# Patient Record
Sex: Male | Born: 1990
Health system: Southern US, Community
[De-identification: ages and names within clinical notes are randomized; demographics above are authoritative.]

## PROBLEM LIST (undated history)

## (undated) ENCOUNTER — Emergency Department (HOSPITAL_COMMUNITY): Source: Home / Self Care

## (undated) ENCOUNTER — Emergency Department (HOSPITAL_COMMUNITY): Payer: Medicaid Other

## (undated) ENCOUNTER — Ambulatory Visit (HOSPITAL_COMMUNITY): Admission: EM | Source: Home / Self Care

## (undated) DIAGNOSIS — I639 Cerebral infarction, unspecified: Secondary | ICD-10-CM

## (undated) DIAGNOSIS — T8484XA Pain due to internal orthopedic prosthetic devices, implants and grafts, initial encounter: Secondary | ICD-10-CM

## (undated) DIAGNOSIS — R569 Unspecified convulsions: Secondary | ICD-10-CM

## (undated) DIAGNOSIS — D696 Thrombocytopenia, unspecified: Secondary | ICD-10-CM

## (undated) DIAGNOSIS — E785 Hyperlipidemia, unspecified: Secondary | ICD-10-CM

## (undated) DIAGNOSIS — I10 Essential (primary) hypertension: Secondary | ICD-10-CM

## (undated) DIAGNOSIS — K769 Liver disease, unspecified: Secondary | ICD-10-CM

## (undated) DIAGNOSIS — K219 Gastro-esophageal reflux disease without esophagitis: Secondary | ICD-10-CM

## (undated) DIAGNOSIS — F1010 Alcohol abuse, uncomplicated: Secondary | ICD-10-CM

## (undated) HISTORY — DX: Hyperlipidemia, unspecified: E78.5

## (undated) HISTORY — DX: Thrombocytopenia, unspecified: D69.6

## (undated) HISTORY — DX: Essential (primary) hypertension: I10

## (undated) HISTORY — DX: Unspecified convulsions: R56.9

## (undated) HISTORY — DX: Liver disease, unspecified: K76.9

## (undated) HISTORY — DX: Cerebral infarction, unspecified: I63.9

---

## 2011-05-05 ENCOUNTER — Emergency Department (HOSPITAL_COMMUNITY): Payer: Medicaid Other

## 2011-05-05 ENCOUNTER — Emergency Department (HOSPITAL_COMMUNITY)
Admission: EM | Admit: 2011-05-05 | Discharge: 2011-05-06 | Disposition: A | Discharge status: Home / Self Care | Payer: Medicaid Other | Attending: Emergency Medicine | Admitting: Emergency Medicine

## 2011-05-05 DIAGNOSIS — R059 Cough, unspecified: Secondary | ICD-10-CM | POA: Insufficient documentation

## 2011-05-05 DIAGNOSIS — IMO0001 Reserved for inherently not codable concepts without codable children: Secondary | ICD-10-CM | POA: Insufficient documentation

## 2011-05-05 DIAGNOSIS — R05 Cough: Secondary | ICD-10-CM | POA: Insufficient documentation

## 2011-05-05 DIAGNOSIS — R509 Fever, unspecified: Secondary | ICD-10-CM | POA: Insufficient documentation

## 2011-05-05 DIAGNOSIS — B9789 Other viral agents as the cause of diseases classified elsewhere: Secondary | ICD-10-CM | POA: Insufficient documentation

## 2011-05-05 LAB — URINALYSIS, ROUTINE W REFLEX MICROSCOPIC
Bilirubin Urine: NEGATIVE
Nitrite: NEGATIVE
Specific Gravity, Urine: 1.023 (ref 1.005–1.030)
Urobilinogen, UA: 1 mg/dL (ref 0.0–1.0)

## 2011-05-05 LAB — DIFFERENTIAL
Basophils Absolute: 0 10*3/uL (ref 0.0–0.1)
Basophils Relative: 0 % (ref 0–1)
Eosinophils Absolute: 0 10*3/uL (ref 0.0–0.7)
Eosinophils Relative: 0 % (ref 0–5)
Monocytes Absolute: 0.8 10*3/uL (ref 0.1–1.0)

## 2011-05-05 LAB — CBC
MCHC: 35.4 g/dL (ref 30.0–36.0)
Platelets: 129 10*3/uL — ABNORMAL LOW (ref 150–400)
RDW: 13.8 % (ref 11.5–15.5)

## 2011-05-05 LAB — POCT I-STAT, CHEM 8
Glucose, Bld: 87 mg/dL (ref 70–99)
HCT: 49 % (ref 39.0–52.0)
Hemoglobin: 16.7 g/dL (ref 13.0–17.0)
Potassium: 4 mEq/L (ref 3.5–5.1)
Sodium: 139 mEq/L (ref 135–145)

## 2011-11-20 ENCOUNTER — Emergency Department (HOSPITAL_COMMUNITY)
Admission: EM | Admit: 2011-11-20 | Discharge: 2011-11-20 | Disposition: A | Discharge status: Home / Self Care | Payer: Medicaid Other | Source: Home / Self Care

## 2011-11-20 ENCOUNTER — Emergency Department (INDEPENDENT_AMBULATORY_CARE_PROVIDER_SITE_OTHER): Payer: Medicaid Other

## 2011-11-20 ENCOUNTER — Encounter (HOSPITAL_COMMUNITY): Payer: Self-pay

## 2011-11-20 DIAGNOSIS — J069 Acute upper respiratory infection, unspecified: Secondary | ICD-10-CM

## 2011-11-20 MED ORDER — IBUPROFEN 800 MG PO TABS: 800.0000 mg | ORAL_TABLET | Freq: Three times a day (TID) | ORAL | Status: AC

## 2011-11-20 NOTE — Discharge Instructions (Signed)
Your chest xray is negative. Increase fluids and rest. Take an over the counter cough and cold medication as needed. Run a vaporizer in your bedroom while sleeping. Take Ibuprofen as prescribed for headache pain or fever. Return if symptoms change or worsen.    Upper Respiratory Infection, Adult An upper respiratory infection (URI) is also known as the common cold. It is often caused by a type of germ (virus). Colds are easily spread (contagious). You can pass it to others by kissing, coughing, sneezing, or drinking out of the same glass. Usually, you get better in 1 or 2 weeks.  HOME CARE   Only take medicine as told by your doctor.   Use a warm mist humidifier or breathe in steam from a hot shower.   Drink enough water and fluids to keep your pee (urine) clear or pale yellow.   Get plenty of rest.   Return to work when your temperature is back to normal or as told by your doctor. You may use a face mask and wash your hands to stop your cold from spreading.  GET HELP RIGHT AWAY IF:   After the first few days, you feel you are getting worse.   You have questions about your medicine.   You have chills, shortness of breath, or brown or red spit (mucus).   You have yellow or brown snot (nasal discharge) or pain in the face, especially when you bend forward.   You have a fever, puffy (swollen) neck, pain when you swallow, or white spots in the back of your throat.   You have a bad headache, ear pain, sinus pain, or chest pain.   You have a high-pitched whistling sound when you breathe in and out (wheezing).   You have a lasting cough or cough up blood.   You have sore muscles or a stiff neck.  MAKE SURE YOU:   Understand these instructions.   Will watch your condition.   Will get help right away if you are not doing well or get worse.  Document Released: 01/11/2008 Document Revised: 07/14/2011 Document Reviewed: 11/29/2010 [facility] Patient Information 2012 [place],  [place].

## 2011-11-20 NOTE — ED Notes (Signed)
1 day hx of chest pain and headache.  He is coughing up white mucous with blood tinged.  No appetite as well.  At night he feels like he has fevers.   Has taken his Tylenol medication.

## 2011-11-20 NOTE — ED Provider Notes (Signed)
History     CSN: [id number]  Arrival date & time 11/20/11  1241   None     Chief Complaint  Patient presents with  . Cough    1 day hx of chest pain and headache.  He is coughing up white mucous with blood tinged.  No appetite as well.  At night he feels like he has fevers.     (Consider location/radiation/quality/duration/timing/severity/associated sxs/prior treatment) HPI Comments: The patient presents with complaints of nasal congestion, chest congestion and cough for 2-3 days. He states his nasal congestion and sputum are blood streaked. He reports decreased appetite and feeling feverish at night. He has also experienced intermittent headaches, in the mornings when he awakens and at lunchtime, the last 2 days. He has tried one dose of an over-the-counter pain reliever without relief. Uncertain of weight loss, patient reports that he is losing weight, but then admits that he does not weigh himself and does not know what he typically weighs. Patient has been in the [place] from [place] for 9 months.   History reviewed. No pertinent past medical history.  History reviewed. No pertinent past surgical history.  History reviewed. No pertinent family history.  History  Substance Use Topics  . Smoking status: Not on file  . Smokeless tobacco: Not on file  . Alcohol Use: Not on file      Review of Systems  Constitutional: Positive for appetite change. Negative for fever, chills and fatigue.  HENT: Positive for congestion and rhinorrhea. Negative for ear pain, sore throat and sinus pressure.   Respiratory: Positive for cough. Negative for shortness of breath and wheezing.   Cardiovascular: Negative for chest pain.    Allergies  Review of patient's allergies indicates no known allergies.  Home Medications   Current Outpatient Rx  Name Route Sig Dispense Refill  . IBUPROFEN 800 MG PO TABS Oral Take 1 tablet (800 mg total) by mouth 3 (three) times daily. 15 tablet 0    BP 122/83   Pulse 55  Temp(Src) 98.4 F (36.9 C) (Oral)  Resp 14  SpO2 100%  Physical Exam  Nursing note and vitals reviewed. Constitutional: He appears well-developed and well-nourished. No distress.  HENT:  Head: Normocephalic and atraumatic.  Right Ear: Tympanic membrane, external ear and ear canal normal.  Left Ear: Tympanic membrane, external ear and ear canal normal.  Nose: Nose normal.  Mouth/Throat: Uvula is midline, oropharynx is clear and moist and mucous membranes are normal. No oropharyngeal exudate, posterior oropharyngeal edema or posterior oropharyngeal erythema.  Neck: Neck supple.  Cardiovascular: Normal rate, regular rhythm and normal heart sounds.   Pulmonary/Chest: Effort normal and breath sounds normal. No respiratory distress.  Lymphadenopathy:    He has no cervical adenopathy.  Neurological: He is alert.  Skin: Skin is warm and dry.  Psychiatric: He has a normal mood and affect.    ED Course  Procedures (including critical care time)  Labs Reviewed - No data to display Dg Chest 2 View  11/20/2011  *RADIOLOGY REPORT*  Clinical Data: 21 year old male with cough and night sweats.  CHEST - 2 VIEW  Comparison: 05/05/2011  Findings: The cardiomediastinal silhouette is unremarkable. The lungs are clear. There is no evidence of focal airspace disease, pulmonary edema, suspicious pulmonary nodule/mass, pleural effusion, or pneumothorax. No acute bony abnormalities are identified.  IMPRESSION: No evidence of active cardiopulmonary disease.  Original Report Authenticated By: Rosendo Gros, M.D.     1. Acute URI  MDM  CXR neg. 2-3 days of nasal & chest congestion with blood streaked mucus. Afebrile. Exam neg.         Melody Comas, [place] 11/20/11 1549

## 2011-11-22 NOTE — ED Provider Notes (Signed)
Medical screening examination/treatment/procedure(s) were performed by non-physician practitioner and as supervising physician I was immediately available for consultation/collaboration.  Luiz Blare MD   Luiz Blare, MD 11/22/11 [id number]

## 2012-04-09 ENCOUNTER — Encounter (HOSPITAL_COMMUNITY): Payer: Self-pay

## 2012-04-09 ENCOUNTER — Emergency Department (INDEPENDENT_AMBULATORY_CARE_PROVIDER_SITE_OTHER)
Admission: EM | Admit: 2012-04-09 | Discharge: 2012-04-09 | Disposition: A | Discharge status: Home / Self Care | Payer: Medicaid Other | Source: Home / Self Care | Attending: Family Medicine | Admitting: Family Medicine

## 2012-04-09 DIAGNOSIS — R079 Chest pain, unspecified: Secondary | ICD-10-CM

## 2012-04-09 DIAGNOSIS — R300 Dysuria: Secondary | ICD-10-CM

## 2012-04-09 DIAGNOSIS — M549 Dorsalgia, unspecified: Secondary | ICD-10-CM

## 2012-04-09 LAB — POCT URINALYSIS DIP (DEVICE)
Hgb urine dipstick: NEGATIVE
Protein, ur: NEGATIVE mg/dL
Specific Gravity, Urine: 1.01 (ref 1.005–1.030)
Urobilinogen, UA: 0.2 mg/dL (ref 0.0–1.0)

## 2012-04-09 MED ORDER — IBUPROFEN 600 MG PO TABS: 600.0000 mg | ORAL_TABLET | Freq: Four times a day (QID) | ORAL | Status: AC | PRN

## 2012-04-09 MED ORDER — DOXYCYCLINE HYCLATE 100 MG PO CAPS: 100.0000 mg | ORAL_CAPSULE | Freq: Two times a day (BID) | ORAL | Status: AC

## 2012-04-09 MED ORDER — OMEPRAZOLE 20 MG PO CPDR: 20.0000 mg | DELAYED_RELEASE_CAPSULE | Freq: Every day | ORAL | Status: DC

## 2012-04-09 NOTE — ED Notes (Signed)
Cough and chest discomfort for about 1 year; syx come and go; NAD at present, no gross wheezing or rales on ascultation

## 2012-04-09 NOTE — ED Provider Notes (Signed)
History     CSN: [id number]  Arrival date & time 04/09/12  1113   First MD Initiated Contact with Patient 04/09/12 1325      Chief Complaint  Patient presents with  . Cough    (Consider location/radiation/quality/duration/timing/severity/associated sxs/prior treatment) Patient is a 21 y.o. male presenting with cough. The history is provided by the patient.  Cough Associated symptoms include chest pain. Pertinent negatives include no shortness of breath and no wheezing.  Patient reports substernal chest pain for greater than six months associated with cough.   Additionally complains of pain with urination for 3 months.    History reviewed. No pertinent past medical history.  History reviewed. No pertinent past surgical history.  History reviewed. No pertinent family history.  History  Substance Use Topics  . Smoking status: Not on file  . Smokeless tobacco: Not on file  . Alcohol Use: Not on file      Review of Systems  Constitutional: Negative.   Respiratory: Positive for cough. Negative for choking, chest tightness, shortness of breath, wheezing and stridor.   Cardiovascular: Positive for chest pain. Negative for palpitations and leg swelling.  Gastrointestinal: Negative.   Genitourinary: Positive for dysuria. Negative for urgency, hematuria, discharge, penile swelling, penile pain and testicular pain.    Allergies  Review of patient's allergies indicates no known allergies.  Home Medications   Current Outpatient Rx  Name Route Sig Dispense Refill  . DOXYCYCLINE HYCLATE 100 MG PO CAPS Oral Take 1 capsule (100 mg total) by mouth 2 (two) times daily. 14 capsule 0  . IBUPROFEN 600 MG PO TABS Oral Take 1 tablet (600 mg total) by mouth every 6 (six) hours as needed for pain. 30 tablet 0  . OMEPRAZOLE 20 MG PO CPDR Oral Take 1 capsule (20 mg total) by mouth daily. 30 capsule 3    BP 123/76  Pulse 75  Temp 98.4 F (36.9 C) (Oral)  Resp 16  SpO2 100%  Physical  Exam  Nursing note and vitals reviewed. Constitutional: He is oriented to person, place, and time. Vital signs are normal. He appears well-developed and well-nourished. He is active and cooperative.  HENT:  Head: Normocephalic.  Right Ear: External ear normal.  Left Ear: External ear normal.  Mouth/Throat: Oropharynx is clear and moist. No oropharyngeal exudate.  Eyes: Conjunctivae are normal. Pupils are equal, round, and reactive to light. No scleral icterus.  Neck: Trachea normal. Neck supple.  Cardiovascular: Normal rate, regular rhythm, normal heart sounds and intact distal pulses.   Pulmonary/Chest: Effort normal and breath sounds normal.  Abdominal: Soft. Bowel sounds are normal. There is no tenderness.  Genitourinary: Testes normal and penis normal. Cremasteric reflex is present. Uncircumcised. No phimosis, paraphimosis, hypospadias, penile erythema or penile tenderness. No discharge found.  Lymphadenopathy:    He has no cervical adenopathy.       Right: No inguinal adenopathy present.       Left: Inguinal adenopathy present.  Neurological: He is alert and oriented to person, place, and time. No cranial nerve deficit or sensory deficit.  Skin: Skin is warm and dry. No rash noted.  Psychiatric: He has a normal mood and affect. His speech is normal and behavior is normal. Judgment and thought content normal. Cognition and memory are normal.    ED Course  Procedures (including critical care time)   Labs Reviewed  GC/CHLAMYDIA PROBE AMP, URINE  POCT URINALYSIS DIP (DEVICE)  LAB REPORT - SCANNED   No results found.  1. Dysuria   2. Back pain   3. Chest pain       MDM  Omeprazole for chest pain, probable GERD.  Take antibiotics as prescribed.         Troy Kindred, NP 04/13/12 0825

## 2012-04-12 LAB — GC/CHLAMYDIA PROBE AMP, URINE: Chlamydia, Swab/Urine, PCR: NEGATIVE

## 2012-04-16 NOTE — ED Provider Notes (Signed)
Medical screening examination/treatment/procedure(s) were performed by resident physician or non-physician practitioner and as supervising physician I was immediately available for consultation/collaboration.   Barkley Bruns MD.    Linna Hoff, MD 04/16/12 1324

## 2012-07-31 ENCOUNTER — Emergency Department (HOSPITAL_COMMUNITY): Payer: Medicaid Other

## 2012-07-31 ENCOUNTER — Encounter (HOSPITAL_COMMUNITY): Payer: Self-pay | Admitting: Certified Registered Nurse Anesthetist

## 2012-07-31 ENCOUNTER — Encounter (HOSPITAL_COMMUNITY): Admission: EM | Disposition: A | Discharge status: Rehabilitation Facility | Payer: Self-pay | Source: Home / Self Care

## 2012-07-31 ENCOUNTER — Encounter (HOSPITAL_COMMUNITY): Payer: Self-pay | Admitting: *Deleted

## 2012-07-31 ENCOUNTER — Inpatient Hospital Stay (HOSPITAL_COMMUNITY)
Admission: EM | Admit: 2012-07-31 | Discharge: 2012-08-06 | DRG: 957 | Disposition: A | Discharge status: Rehabilitation Facility | Payer: Medicaid Other | Attending: General Surgery | Admitting: General Surgery

## 2012-07-31 ENCOUNTER — Inpatient Hospital Stay (HOSPITAL_COMMUNITY): Payer: Medicaid Other | Admitting: Certified Registered Nurse Anesthetist

## 2012-07-31 ENCOUNTER — Inpatient Hospital Stay (HOSPITAL_COMMUNITY): Payer: Medicaid Other

## 2012-07-31 DIAGNOSIS — S79929A Unspecified injury of unspecified thigh, initial encounter: Secondary | ICD-10-CM

## 2012-07-31 DIAGNOSIS — E872 Acidosis, unspecified: Secondary | ICD-10-CM | POA: Diagnosis not present

## 2012-07-31 DIAGNOSIS — S79919A Unspecified injury of unspecified hip, initial encounter: Secondary | ICD-10-CM

## 2012-07-31 DIAGNOSIS — S066XAA Traumatic subarachnoid hemorrhage with loss of consciousness status unknown, initial encounter: Secondary | ICD-10-CM | POA: Diagnosis present

## 2012-07-31 DIAGNOSIS — S3210XA Unspecified fracture of sacrum, initial encounter for closed fracture: Secondary | ICD-10-CM | POA: Diagnosis present

## 2012-07-31 DIAGNOSIS — E876 Hypokalemia: Secondary | ICD-10-CM | POA: Diagnosis not present

## 2012-07-31 DIAGNOSIS — T07XXXA Unspecified multiple injuries, initial encounter: Secondary | ICD-10-CM

## 2012-07-31 DIAGNOSIS — S32599A Other specified fracture of unspecified pubis, initial encounter for closed fracture: Secondary | ICD-10-CM | POA: Diagnosis present

## 2012-07-31 DIAGNOSIS — S82402B Unspecified fracture of shaft of left fibula, initial encounter for open fracture type I or II: Secondary | ICD-10-CM | POA: Diagnosis present

## 2012-07-31 DIAGNOSIS — Y92410 Unspecified street and highway as the place of occurrence of the external cause: Secondary | ICD-10-CM

## 2012-07-31 DIAGNOSIS — J95821 Acute postprocedural respiratory failure: Secondary | ICD-10-CM | POA: Diagnosis present

## 2012-07-31 DIAGNOSIS — S069X9A Unspecified intracranial injury with loss of consciousness of unspecified duration, initial encounter: Secondary | ICD-10-CM | POA: Diagnosis present

## 2012-07-31 DIAGNOSIS — S069XAA Unspecified intracranial injury with loss of consciousness status unknown, initial encounter: Secondary | ICD-10-CM | POA: Diagnosis present

## 2012-07-31 DIAGNOSIS — S3600XA Unspecified injury of spleen, initial encounter: Secondary | ICD-10-CM | POA: Diagnosis present

## 2012-07-31 DIAGNOSIS — S36039A Unspecified laceration of spleen, initial encounter: Secondary | ICD-10-CM | POA: Diagnosis present

## 2012-07-31 DIAGNOSIS — IMO0002 Reserved for concepts with insufficient information to code with codable children: Secondary | ICD-10-CM

## 2012-07-31 DIAGNOSIS — I609 Nontraumatic subarachnoid hemorrhage, unspecified: Secondary | ICD-10-CM

## 2012-07-31 DIAGNOSIS — D62 Acute posthemorrhagic anemia: Secondary | ICD-10-CM | POA: Diagnosis not present

## 2012-07-31 DIAGNOSIS — S82202B Unspecified fracture of shaft of left tibia, initial encounter for open fracture type I or II: Secondary | ICD-10-CM | POA: Diagnosis present

## 2012-07-31 DIAGNOSIS — S066X9A Traumatic subarachnoid hemorrhage with loss of consciousness of unspecified duration, initial encounter: Secondary | ICD-10-CM | POA: Diagnosis present

## 2012-07-31 DIAGNOSIS — S32509A Unspecified fracture of unspecified pubis, initial encounter for closed fracture: Secondary | ICD-10-CM | POA: Diagnosis present

## 2012-07-31 DIAGNOSIS — S0003XA Contusion of scalp, initial encounter: Secondary | ICD-10-CM | POA: Diagnosis present

## 2012-07-31 DIAGNOSIS — S82209B Unspecified fracture of shaft of unspecified tibia, initial encounter for open fracture type I or II: Secondary | ICD-10-CM

## 2012-07-31 DIAGNOSIS — J9601 Acute respiratory failure with hypoxia: Secondary | ICD-10-CM | POA: Diagnosis present

## 2012-07-31 DIAGNOSIS — D696 Thrombocytopenia, unspecified: Secondary | ICD-10-CM | POA: Diagnosis not present

## 2012-07-31 DIAGNOSIS — Y998 Other external cause status: Secondary | ICD-10-CM

## 2012-07-31 HISTORY — PX: TIBIA IM NAIL INSERTION: SHX2516

## 2012-07-31 LAB — CBC WITH DIFFERENTIAL/PLATELET
Eosinophils Absolute: 0.1 10*3/uL (ref 0.0–0.7)
Hemoglobin: 15.9 g/dL (ref 13.0–17.0)
MCHC: 34.5 g/dL (ref 30.0–36.0)
Neutro Abs: 5.6 10*3/uL (ref 1.7–7.7)
Neutrophils Relative %: 66 % (ref 43–77)
RDW: 13 % (ref 11.5–15.5)

## 2012-07-31 LAB — PROTIME-INR
INR: 1.02 (ref 0.00–1.49)
Prothrombin Time: 13.3 seconds (ref 11.6–15.2)

## 2012-07-31 LAB — COMPREHENSIVE METABOLIC PANEL
ALT: 70 U/L — ABNORMAL HIGH (ref 0–53)
BUN: 11 mg/dL (ref 6–23)
CO2: 20 mEq/L (ref 19–32)
Calcium: 8.9 mg/dL (ref 8.4–10.5)
Creatinine, Ser: 0.92 mg/dL (ref 0.50–1.35)
GFR calc Af Amer: >90 mL/min (ref >90)
GFR calc non Af Amer: >90 mL/min (ref >90)
Glucose, Bld: 145 mg/dL — ABNORMAL HIGH (ref 70–99)
Sodium: 139 mEq/L (ref 135–145)
Total Protein: 8.3 g/dL (ref 6.0–8.3)

## 2012-07-31 LAB — LACTIC ACID, PLASMA: Lactic Acid, Venous: 3.9 mmol/L — ABNORMAL HIGH (ref 0.5–2.2)

## 2012-07-31 LAB — URINALYSIS, ROUTINE W REFLEX MICROSCOPIC
Bilirubin Urine: NEGATIVE
Leukocytes, UA: NEGATIVE
Nitrite: NEGATIVE
Specific Gravity, Urine: 1.01 (ref 1.005–1.030)
Urobilinogen, UA: 0.2 mg/dL (ref 0.0–1.0)
pH: 6.5 (ref 5.0–8.0)

## 2012-07-31 LAB — RAPID URINE DRUG SCREEN, HOSP PERFORMED
Barbiturates: NOT DETECTED
Cocaine: NOT DETECTED
Tetrahydrocannabinol: NOT DETECTED

## 2012-07-31 LAB — POCT I-STAT 3, ART BLOOD GAS (G3+)
Acid-base deficit: 4 mmol/L — ABNORMAL HIGH (ref 0.0–2.0)
Bicarbonate: 22.2 meq/L (ref 20.0–24.0)
O2 Saturation: 99 %
Patient temperature: 36.4
TCO2: 24 mmol/L (ref 0–100)
pCO2 arterial: 44.5 mmHg (ref 35.0–45.0)
pH, Arterial: 7.303 — ABNORMAL LOW (ref 7.350–7.450)
pO2, Arterial: 174 mmHg — ABNORMAL HIGH (ref 80.0–100.0)

## 2012-07-31 LAB — ABO/RH: ABO/RH(D): A POS

## 2012-07-31 LAB — LIPASE, BLOOD: Lipase: 91 U/L — ABNORMAL HIGH (ref 11–59)

## 2012-07-31 LAB — URINE MICROSCOPIC-ADD ON

## 2012-07-31 SURGERY — INSERTION, INTRAMEDULLARY ROD, TIBIA
Anesthesia: General | Site: Leg Lower | Laterality: Left | Wound class: Contaminated

## 2012-07-31 MED ORDER — CEFAZOLIN SODIUM-DEXTROSE 2-3 GM-% IV SOLR
INTRAVENOUS | Status: DC | PRN
  Administered 2012-07-31: 2 g via INTRAVENOUS

## 2012-07-31 MED ORDER — FENTANYL CITRATE 0.05 MG/ML IJ SOLN
INTRAMUSCULAR | Status: AC | PRN
  Administered 2012-07-31 (×2): 50 ug via INTRAVENOUS

## 2012-07-31 MED ORDER — SODIUM CHLORIDE 0.9 % IR SOLN
Status: DC | PRN
  Administered 2012-07-31: 6000 mL

## 2012-07-31 MED ORDER — SODIUM CHLORIDE 0.9 % IV SOLN
1.0000 mg/h | INTRAVENOUS | Status: DC
  Administered 2012-07-31 (×2): 1 mg/h via INTRAVENOUS
  Administered 2012-08-01: 2 mg/h via INTRAVENOUS
  Administered 2012-08-02: 3 mg/h via INTRAVENOUS
  Filled 2012-07-31 (×4): qty 10

## 2012-07-31 MED ORDER — IOHEXOL 300 MG/ML  SOLN
100.0000 mL | Freq: Once | INTRAMUSCULAR | Status: AC | PRN
  Administered 2012-07-31: 100 mL via INTRAVENOUS

## 2012-07-31 MED ORDER — ONDANSETRON HCL 4 MG/2ML IJ SOLN
4.0000 mg | Freq: Once | INTRAMUSCULAR | Status: AC
  Administered 2012-07-31: 4 mg via INTRAVENOUS

## 2012-07-31 MED ORDER — ONDANSETRON HCL 4 MG/2ML IJ SOLN
INTRAMUSCULAR | Status: AC
  Administered 2012-07-31: 4 mg via INTRAVENOUS
  Filled 2012-07-31: qty 2

## 2012-07-31 MED ORDER — SODIUM CHLORIDE 0.9 % IV SOLN
2500.0000 ug | INTRAVENOUS | Status: DC | PRN
  Administered 2012-07-31: 50 ug/h via INTRAVENOUS

## 2012-07-31 MED ORDER — SODIUM CHLORIDE 0.9 % IV BOLUS (SEPSIS)
1000.0000 mL | Freq: Once | INTRAVENOUS | Status: AC
  Administered 2012-07-31: 1000 mL via INTRAVENOUS

## 2012-07-31 MED ORDER — LACTATED RINGERS IV SOLN
INTRAVENOUS | Status: DC | PRN
  Administered 2012-07-31 – 2012-08-01 (×3): via INTRAVENOUS

## 2012-07-31 MED ORDER — MIDAZOLAM HCL 5 MG/5ML IJ SOLN
INTRAMUSCULAR | Status: DC | PRN
  Administered 2012-07-31 – 2012-08-01 (×2): 1 mg via INTRAVENOUS

## 2012-07-31 MED ORDER — SUCCINYLCHOLINE CHLORIDE 20 MG/ML IJ SOLN
INTRAMUSCULAR | Status: AC | PRN
  Administered 2012-07-31: 150 mg via INTRAVENOUS

## 2012-07-31 MED ORDER — FENTANYL CITRATE 0.05 MG/ML IJ SOLN
INTRAMUSCULAR | Status: DC | PRN
  Administered 2012-07-31 (×2): 50 ug via INTRAVENOUS
  Administered 2012-08-01 (×2): 25 ug via INTRAVENOUS
  Administered 2012-08-01: 50 ug via INTRAVENOUS
  Administered 2012-08-01 (×2): 25 ug via INTRAVENOUS

## 2012-07-31 MED ORDER — LIDOCAINE HCL (CARDIAC) 20 MG/ML IV SOLN
INTRAVENOUS | Status: AC | PRN
  Administered 2012-07-31: 100 mg via INTRAVENOUS

## 2012-07-31 MED ORDER — ETOMIDATE 2 MG/ML IV SOLN
INTRAVENOUS | Status: AC | PRN
  Administered 2012-07-31: 20 mg via INTRAVENOUS

## 2012-07-31 MED ORDER — SODIUM CHLORIDE 0.9 % IV SOLN
INTRAVENOUS | Status: DC | PRN
  Administered 2012-07-31: 23:00:00 via INTRAVENOUS

## 2012-07-31 MED ORDER — MIDAZOLAM BOLUS VIA INFUSION
1.0000 mg | INTRAVENOUS | Status: DC | PRN
  Filled 2012-07-31: qty 2

## 2012-07-31 MED ORDER — MIDAZOLAM HCL 5 MG/5ML IJ SOLN
INTRAMUSCULAR | Status: AC | PRN
  Administered 2012-07-31 (×2): 2 mg via INTRAVENOUS

## 2012-07-31 MED ORDER — FENTANYL BOLUS VIA INFUSION
25.0000 ug | Freq: Four times a day (QID) | INTRAVENOUS | Status: DC | PRN
  Filled 2012-07-31: qty 100

## 2012-07-31 MED ORDER — SODIUM CHLORIDE 0.9 % IV SOLN
25.0000 ug/h | INTRAVENOUS | Status: DC
  Administered 2012-07-31: 50 ug/h via INTRAVENOUS
  Administered 2012-08-01: 75 ug/h via INTRAVENOUS
  Filled 2012-07-31 (×3): qty 50

## 2012-07-31 MED ORDER — ARTIFICIAL TEARS OP OINT
TOPICAL_OINTMENT | OPHTHALMIC | Status: DC | PRN
  Administered 2012-07-31: 1 via OPHTHALMIC

## 2012-07-31 MED ORDER — FENTANYL CITRATE 0.05 MG/ML IJ SOLN
INTRAMUSCULAR | Status: AC
  Filled 2012-07-31: qty 2

## 2012-07-31 MED ORDER — MIDAZOLAM HCL 2 MG/2ML IJ SOLN
INTRAMUSCULAR | Status: AC
  Administered 2012-07-31: 2 mg
  Filled 2012-07-31: qty 4

## 2012-07-31 MED ORDER — DEXTROSE 5 % IV SOLN
INTRAVENOUS | Status: DC | PRN
  Administered 2012-07-31: 23:00:00 via INTRAVENOUS

## 2012-07-31 MED ORDER — ROCURONIUM BROMIDE 100 MG/10ML IV SOLN
INTRAVENOUS | Status: DC | PRN
  Administered 2012-07-31: 50 mg via INTRAVENOUS
  Administered 2012-08-01: 10 mg via INTRAVENOUS
  Administered 2012-08-01: 20 mg via INTRAVENOUS

## 2012-07-31 SURGICAL SUPPLY — 53 items
BANDAGE ELASTIC 4 VELCRO ST LF (GAUZE/BANDAGES/DRESSINGS) ×2 IMPLANT
BANDAGE ESMARK 6X9 LF (GAUZE/BANDAGES/DRESSINGS) IMPLANT
BIT DRILL 2.5X2.75 QC CALB (BIT) ×2 IMPLANT
BIT DRILL 3.8X6 NS (BIT) ×2 IMPLANT
BIT DRILL 4.4 NS (BIT) ×2 IMPLANT
BNDG COHESIVE 4X5 TAN STRL (GAUZE/BANDAGES/DRESSINGS) ×2 IMPLANT
BNDG COHESIVE 6X5 TAN STRL LF (GAUZE/BANDAGES/DRESSINGS) ×2 IMPLANT
BNDG ESMARK 6X9 LF (GAUZE/BANDAGES/DRESSINGS)
CLOTH BEACON ORANGE TIMEOUT ST (SAFETY) ×2 IMPLANT
COVER SURGICAL LIGHT HANDLE (MISCELLANEOUS) ×4 IMPLANT
CUFF TOURNIQUET SINGLE 34IN LL (TOURNIQUET CUFF) ×2 IMPLANT
CUFF TOURNIQUET SINGLE 44IN (TOURNIQUET CUFF) IMPLANT
DRAPE C-ARM 42X72 X-RAY (DRAPES) ×2 IMPLANT
DRAPE INCISE IOBAN 66X45 STRL (DRAPES) ×2 IMPLANT
DRAPE ORTHO SPLIT 77X108 STRL (DRAPES) ×2
DRAPE SURG ORHT 6 SPLT 77X108 (DRAPES) ×2 IMPLANT
DRAPE U-SHAPE 47X51 STRL (DRAPES) ×2 IMPLANT
DRSG ADAPTIC 3X8 NADH LF (GAUZE/BANDAGES/DRESSINGS) ×2 IMPLANT
ELECT REM PT RETURN 9FT ADLT (ELECTROSURGICAL) ×2
ELECTRODE REM PT RTRN 9FT ADLT (ELECTROSURGICAL) ×1 IMPLANT
EVACUATOR 1/8 PVC DRAIN (DRAIN) IMPLANT
GLOVE BIO SURGEON STRL SZ8 (GLOVE) ×4 IMPLANT
GLOVE BIOGEL PI IND STRL 8 (GLOVE) ×1 IMPLANT
GLOVE BIOGEL PI INDICATOR 8 (GLOVE) ×1
GOWN STRL NON-REIN LRG LVL3 (GOWN DISPOSABLE) ×6 IMPLANT
GUIDEWIRE BALL NOSE 80CM (WIRE) ×2 IMPLANT
KIT BASIN OR (CUSTOM PROCEDURE TRAY) ×2 IMPLANT
KIT ROOM TURNOVER OR (KITS) ×2 IMPLANT
NAIL TIBIAL 10MMX31.5CM (Nail) ×2 IMPLANT
PACK ORTHO EXTREMITY (CUSTOM PROCEDURE TRAY) ×2 IMPLANT
PAD ARMBOARD 7.5X6 YLW CONV (MISCELLANEOUS) ×4 IMPLANT
PLATE LOCK COMP 5H FOOT (Plate) ×2 IMPLANT
SCREW ACECAP 34MM (Screw) ×2 IMPLANT
SCREW ACECAP 42MM (Screw) ×2 IMPLANT
SCREW CORTICAL 3.5MM  12MM (Screw) ×4 IMPLANT
SCREW CORTICAL 3.5MM 12MM (Screw) ×4 IMPLANT
SCREW PROXIMAL DEPUY (Screw) ×2 IMPLANT
SCREW PRXML FT 45X5.5XLCK NS (Screw) ×1 IMPLANT
SCREW PRXML FT 55X5.5XNS TIB (Screw) ×1 IMPLANT
SPONGE GAUZE 4X4 12PLY (GAUZE/BANDAGES/DRESSINGS) ×2 IMPLANT
SPONGE LAP 18X18 X RAY DECT (DISPOSABLE) ×2 IMPLANT
STAPLER VISISTAT 35W (STAPLE) ×2 IMPLANT
SUT ETHILON 3 0 PS 1 (SUTURE) ×2 IMPLANT
SUT MNCRL AB 3-0 PS2 18 (SUTURE) ×2 IMPLANT
SUT VIC AB 0 CT1 27 (SUTURE) ×1
SUT VIC AB 0 CT1 27XBRD ANBCTR (SUTURE) ×1 IMPLANT
SUT VIC AB 2-0 CT1 27 (SUTURE)
SUT VIC AB 2-0 CT1 TAPERPNT 27 (SUTURE) IMPLANT
TOWEL OR 17X24 6PK STRL BLUE (TOWEL DISPOSABLE) ×2 IMPLANT
TOWEL OR 17X26 10 PK STRL BLUE (TOWEL DISPOSABLE) ×2 IMPLANT
TUBE CONNECTING 12X1/4 (SUCTIONS) ×2 IMPLANT
UNDERPAD 30X30 INCONTINENT (UNDERPADS AND DIAPERS) ×2 IMPLANT
YANKAUER SUCT BULB TIP NO VENT (SUCTIONS) ×2 IMPLANT

## 2012-07-31 NOTE — ED Notes (Signed)
Pt to CT with RN, RN, MD and on full CR monitor

## 2012-07-31 NOTE — Preoperative (Signed)
Beta Blockers   Reason not to administer Beta Blockers:Not Applicable 
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
## 2012-07-31 NOTE — ED Notes (Signed)
Patient taken to the OR

## 2012-07-31 NOTE — ED Notes (Signed)
CSI Lewis took pt's belongings with her

## 2012-07-31 NOTE — ED Notes (Signed)
Attempted to call pt family x6 unable to successfully reach with interpreter.   APA [phone]  Oren Bracket [phone]  Lenetta Quaker [phone]  Cathlyn Parsons [phone]
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
## 2012-07-31 NOTE — ED Notes (Signed)
Patient returned to the room from CT.
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
## 2012-07-31 NOTE — ED Notes (Signed)
Green pads placed under left lower leg.  Bleeding continues from fracture site.
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
## 2012-07-31 NOTE — ED Notes (Signed)
Pt's wife and mother arrived with family friend. Chaplain notified and escorted family to quiet room in POD B. EDP, trauma MD, ortho MD and nursing staff notified of family's arrival. Communication to be done via translator phones.

## 2012-07-31 NOTE — Progress Notes (Signed)
Orthopedic Tech Progress Note Patient Details:  Troy Bishop 11/12/1990 [id number]  Ortho Devices Type of Ortho Device: Short leg splint;Stirrup splint;Ace wrap Ortho Device/Splint Location: (L) LE Ortho Device/Splint Interventions: Application   Karron, [name] 07/31/2012, 7:47 PM

## 2012-08-01 ENCOUNTER — Inpatient Hospital Stay (HOSPITAL_COMMUNITY): Payer: Medicaid Other

## 2012-08-01 DIAGNOSIS — R000 Tachycardia, unspecified: Secondary | ICD-10-CM

## 2012-08-01 DIAGNOSIS — S066XAA Traumatic subarachnoid hemorrhage with loss of consciousness status unknown, initial encounter: Secondary | ICD-10-CM

## 2012-08-01 DIAGNOSIS — D62 Acute posthemorrhagic anemia: Secondary | ICD-10-CM

## 2012-08-01 DIAGNOSIS — I609 Nontraumatic subarachnoid hemorrhage, unspecified: Secondary | ICD-10-CM

## 2012-08-01 DIAGNOSIS — J9600 Acute respiratory failure, unspecified whether with hypoxia or hypercapnia: Secondary | ICD-10-CM

## 2012-08-01 DIAGNOSIS — S82209B Unspecified fracture of shaft of unspecified tibia, initial encounter for open fracture type I or II: Secondary | ICD-10-CM

## 2012-08-01 DIAGNOSIS — T07XXXA Unspecified multiple injuries, initial encounter: Secondary | ICD-10-CM

## 2012-08-01 DIAGNOSIS — S82409B Unspecified fracture of shaft of unspecified fibula, initial encounter for open fracture type I or II: Secondary | ICD-10-CM

## 2012-08-01 DIAGNOSIS — S066X9A Traumatic subarachnoid hemorrhage with loss of consciousness of unspecified duration, initial encounter: Secondary | ICD-10-CM

## 2012-08-01 DIAGNOSIS — S36039A Unspecified laceration of spleen, initial encounter: Secondary | ICD-10-CM

## 2012-08-01 DIAGNOSIS — J9601 Acute respiratory failure with hypoxia: Secondary | ICD-10-CM | POA: Diagnosis present

## 2012-08-01 LAB — POCT I-STAT 3, ART BLOOD GAS (G3+)
Acid-base deficit: 1 mmol/L (ref 0.0–2.0)
Bicarbonate: 25.8 meq/L — ABNORMAL HIGH (ref 20.0–24.0)
pH, Arterial: 7.31 — ABNORMAL LOW (ref 7.350–7.450)
pO2, Arterial: 48 mmHg — ABNORMAL LOW (ref 80.0–100.0)

## 2012-08-01 LAB — BASIC METABOLIC PANEL
BUN: 8 mg/dL (ref 6–23)
CO2: 25 mEq/L (ref 19–32)
Calcium: 7.2 mg/dL — ABNORMAL LOW (ref 8.4–10.5)
Creatinine, Ser: 0.89 mg/dL (ref 0.50–1.35)
Glucose, Bld: 127 mg/dL — ABNORMAL HIGH (ref 70–99)
Sodium: 139 mEq/L (ref 135–145)

## 2012-08-01 LAB — PROTIME-INR: INR: 1.21 (ref 0.00–1.49)

## 2012-08-01 LAB — CBC
MCH: 30.9 pg (ref 26.0–34.0)
MCV: 91.4 fL (ref 78.0–100.0)
WBC: 8.7 10*3/uL (ref 4.0–10.5)

## 2012-08-01 LAB — MRSA PCR SCREENING: MRSA by PCR: NEGATIVE

## 2012-08-01 LAB — CK: Total CK: 1510 U/L — ABNORMAL HIGH (ref 7–232)

## 2012-08-01 LAB — HEMATOCRIT: HCT: 26.8 % — ABNORMAL LOW (ref 39.0–52.0)

## 2012-08-01 MED ORDER — ONDANSETRON HCL 4 MG/2ML IJ SOLN: 4.0000 mg | Freq: Four times a day (QID) | INTRAMUSCULAR | Status: DC | PRN

## 2012-08-01 MED ORDER — SODIUM CHLORIDE 0.9 % IV SOLN: INTRAVENOUS | Status: DC

## 2012-08-01 MED ORDER — SODIUM CHLORIDE 0.9 % IV SOLN
50.0000 mg | INTRAVENOUS | Status: DC | PRN
  Administered 2012-08-01: 1 mg/h via INTRAVENOUS

## 2012-08-01 MED ORDER — PANTOPRAZOLE SODIUM 40 MG IV SOLR
40.0000 mg | Freq: Every day | INTRAVENOUS | Status: DC
  Administered 2012-08-01 – 2012-08-04 (×4): 40 mg via INTRAVENOUS
  Filled 2012-08-01 (×5): qty 40

## 2012-08-01 MED ORDER — PANTOPRAZOLE SODIUM 40 MG PO TBEC: 40.0000 mg | DELAYED_RELEASE_TABLET | Freq: Every day | ORAL | Status: DC

## 2012-08-01 MED ORDER — KCL IN DEXTROSE-NACL 20-5-0.45 MEQ/L-%-% IV SOLN
INTRAVENOUS | Status: DC
  Administered 2012-08-01: 10:00:00 via INTRAVENOUS
  Administered 2012-08-01: 125 mL via INTRAVENOUS
  Administered 2012-08-02 – 2012-08-03 (×3): via INTRAVENOUS
  Filled 2012-08-01 (×12): qty 1000

## 2012-08-01 MED ORDER — SODIUM CHLORIDE 0.9 % IV BOLUS (SEPSIS)
500.0000 mL | Freq: Once | INTRAVENOUS | Status: AC
Start: 2012-08-01 — End: 2012-08-01
  Administered 2012-08-01: 500 mL via INTRAVENOUS

## 2012-08-01 MED ORDER — CHLORHEXIDINE GLUCONATE 0.12 % MT SOLN
15.0000 mL | Freq: Two times a day (BID) | OROMUCOSAL | Status: DC
  Administered 2012-08-01 – 2012-08-04 (×6): 15 mL via OROMUCOSAL
  Filled 2012-08-01 (×9): qty 15

## 2012-08-01 MED ORDER — BIOTENE DRY MOUTH MT LIQD
15.0000 mL | Freq: Four times a day (QID) | OROMUCOSAL | Status: DC
  Administered 2012-08-01 – 2012-08-05 (×12): 15 mL via OROMUCOSAL

## 2012-08-01 MED ORDER — SODIUM CHLORIDE 0.9 % IV BOLUS (SEPSIS)
500.0000 mL | Freq: Once | INTRAVENOUS | Status: AC
  Administered 2012-08-01: 500 mL via INTRAVENOUS

## 2012-08-01 MED ORDER — ACETAMINOPHEN 160 MG/5ML PO SOLN
650.0000 mg | Freq: Four times a day (QID) | ORAL | Status: DC | PRN
  Administered 2012-08-01 – 2012-08-02 (×3): 650 mg via ORAL
  Filled 2012-08-01 (×3): qty 20.3

## 2012-08-01 MED ORDER — CHLORHEXIDINE GLUCONATE 0.12 % MT SOLN: 15.0000 mL | Freq: Two times a day (BID) | OROMUCOSAL | Status: DC

## 2012-08-01 MED ORDER — CEFAZOLIN SODIUM 1-5 GM-% IV SOLN
1.0000 g | Freq: Four times a day (QID) | INTRAVENOUS | Status: AC
  Administered 2012-08-01 (×3): 1 g via INTRAVENOUS
  Filled 2012-08-01 (×3): qty 50

## 2012-08-01 MED ORDER — ONDANSETRON HCL 4 MG PO TABS: 4.0000 mg | ORAL_TABLET | Freq: Four times a day (QID) | ORAL | Status: DC | PRN

## 2012-08-01 MED ORDER — BIOTENE DRY MOUTH MT LIQD
15.0000 mL | Freq: Four times a day (QID) | OROMUCOSAL | Status: DC
  Administered 2012-08-01: 15 mL via OROMUCOSAL

## 2012-08-01 MED ORDER — SODIUM CHLORIDE 0.9 % IV SOLN
INTRAVENOUS | Status: DC
  Administered 2012-08-01: 09:00:00 via INTRAVENOUS

## 2012-08-01 NOTE — Progress Notes (Signed)
I was referred to pt by chaplain Vonzella Nipple who had been on -call the previous evening.  I followed up with family several times during the day. They were feeling relieved that he was doing better and that he was having moments of being alert.  They were grateful for the support and we will continue to check in with family as time permits. Please also page as needed, [phone].  Kathleen Argue 5:16 PM     08/01/12 1700  Clinical Encounter Type  Visited With Family  Visit Type Spiritual support  Referral From Chaplain

## 2012-08-01 NOTE — Transfer of Care (Signed)
Immediate Anesthesia Transfer of Care Note  Patient: Troy Bishop  Procedure(s) Performed: Procedure(s) (LRB) with comments: INTRAMEDULLARY (IM) NAIL TIBIAL (Left)  Patient Location: SICU  Anesthesia Type:General  Level of Consciousness: sedated, unresponsive and Patient remains intubated per anesthesia plan  Airway & Oxygen Therapy: Patient remains intubated per anesthesia plan and Patient placed on Ventilator (see vital sign flow sheet for setting)  Post-op Assessment: Post -op Vital signs reviewed and stable  Post vital signs: Reviewed and stable  Complications: No apparent anesthesia complications
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
## 2012-08-01 NOTE — Progress Notes (Signed)
Patient ID: Troy Bishop, male   DOB: 1990-10-29, 21 y.o.   MRN: [id number]  Still intubated and sedated  Left leg splint intact Not able to assess neuro status Brisk refill distally  Once awake, secondary survey to be performed to make certain no other issues are identified  Dr. Victorino Dike to follow up with leg tomorrow
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
## 2012-08-01 NOTE — Anesthesia Postprocedure Evaluation (Signed)
  Anesthesia Post-op Note  Patient: Troy Bishop  Procedure(s) Performed: Procedure(s) (LRB) with comments: INTRAMEDULLARY (IM) NAIL TIBIAL (Left)  Patient Location: SICU  Anesthesia Type:General  Level of Consciousness: sedated and Patient remains intubated per anesthesia plan  Airway and Oxygen Therapy: Patient remains intubated per anesthesia plan and Patient placed on Ventilator (see vital sign flow sheet for setting)  Post-op Pain: none  Post-op Assessment: Post-op Vital signs reviewed, Patient's Cardiovascular Status Stable, Respiratory Function Stable, Patent Airway, No signs of Nausea or vomiting and Pain level controlled  Post-op Vital Signs: stable  Complications: No apparent anesthesia complications

## 2012-08-01 NOTE — Progress Notes (Signed)
[facility] [place]  Subjective  - POD #1  Remains intubated and sedated   Physical Exam:  Surgical dressing in place. I was able to Doppler the digital arteries on the left. The visualized portion of the foot is well perfused.       Assessment/Plan:  POD #1  No acute vascular surgical issues.  Tramond Slinker IV, V. WELLS 08/01/2012 2:26 PM --  Filed Vitals:   08/01/12 1154  BP:   Pulse:   Temp: 98.1 F (36.7 C)  Resp:     Intake/Output Summary (Last 24 hours) at 08/01/12 1426 Last data filed at 08/01/12 0800  Gross per 24 hour  Intake 6042.05 ml  Output   2020 ml  Net 4022.05 ml     Laboratory CBC    Component Value Date/Time   WBC 8.7 08/01/2012 0230   HGB 10.8* 08/01/2012 0230   HCT 27.4* 08/01/2012 0947   PLT 96* 08/01/2012 0230    BMET    Component Value Date/Time   NA 139 08/01/2012 0230   K 3.4* 08/01/2012 0230   CL 104 08/01/2012 0230   CO2 25 08/01/2012 0230   GLUCOSE 127* 08/01/2012 0230   BUN 8 08/01/2012 0230   CREATININE 0.89 08/01/2012 0230   CALCIUM 7.2* 08/01/2012 0230   GFRNONAA >90 08/01/2012 0230   GFRAA >90 08/01/2012 0230    COAG Lab Results  Component Value Date   INR 1.21 08/01/2012   INR 1.02 07/31/2012   No results found for this basename: PTT    Antibiotics Anti-infectives     Start     Dose/Rate Route Frequency Ordered Stop   08/01/12 0600   ceFAZolin (ANCEF) IVPB 1 g/50 mL premix        1 g 100 mL/hr over 30 Minutes Intravenous Every 6 hours 08/01/12 0215 08/01/12 2359           V. Charlena Cross, M.D. [facility] [place] Office: [phone] Pager:  [phone]

## 2012-08-01 NOTE — Brief Op Note (Signed)
07/31/2012 - 08/01/2012  1:46 AM  PATIENT:  Troy Bishop  21 y.o. male  PRE-OPERATIVE DIAGNOSIS:   Open left tibia fracture  POST-OPERATIVE DIAGNOSIS:  Open Left Tibia fracture (grade IIIA).  Procedure(s): 1.  Irrigation and excisional debridement of left tibia open fracture including skin, subcutaneous tissue, muscle and bone 2.  Intramedullary nailing left open tibia fracture 3.  Fluoro > 1 hour  SURGEON:  Toni Arthurs, MD  ASSISTANT: n/a  ANESTHESIA:   General  EBL:  minimal   TOURNIQUET:   Total Tourniquet Time Documented: Thigh (Left) - 87 minutes  COMPLICATIONS:  None apparent  DISPOSITION:  Extubated, awake and stable to recovery.  DICTATION ID:  [id number]
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
## 2012-08-02 ENCOUNTER — Inpatient Hospital Stay (HOSPITAL_COMMUNITY): Payer: Medicaid Other

## 2012-08-02 DIAGNOSIS — J95821 Acute postprocedural respiratory failure: Secondary | ICD-10-CM

## 2012-08-02 DIAGNOSIS — IMO0002 Reserved for concepts with insufficient information to code with codable children: Secondary | ICD-10-CM

## 2012-08-02 LAB — CBC WITH DIFFERENTIAL/PLATELET
Basophils Absolute: 0 10*3/uL (ref 0.0–0.1)
HCT: 26.5 % — ABNORMAL LOW (ref 39.0–52.0)
Hemoglobin: 9.1 g/dL — ABNORMAL LOW (ref 13.0–17.0)
Lymphocytes Relative: 7 % — ABNORMAL LOW (ref 12–46)
Monocytes Absolute: 0.6 10*3/uL (ref 0.1–1.0)
Monocytes Relative: 7 % (ref 3–12)
Neutro Abs: 7.3 10*3/uL (ref 1.7–7.7)
RBC: 2.94 MIL/uL — ABNORMAL LOW (ref 4.22–5.81)
WBC: 8.6 10*3/uL (ref 4.0–10.5)

## 2012-08-02 LAB — URINE CULTURE: Culture: NO GROWTH

## 2012-08-02 LAB — PREPARE RBC (CROSSMATCH)

## 2012-08-02 LAB — BASIC METABOLIC PANEL
CO2: 27 mEq/L (ref 19–32)
Chloride: 100 mEq/L (ref 96–112)
Sodium: 133 mEq/L — ABNORMAL LOW (ref 135–145)

## 2012-08-02 LAB — HEMATOCRIT: HCT: 22.9 % — ABNORMAL LOW (ref 39.0–52.0)

## 2012-08-02 MED ORDER — OXYCODONE-ACETAMINOPHEN 5-325 MG PO TABS
1.0000 | ORAL_TABLET | ORAL | Status: DC | PRN
  Administered 2012-08-03 – 2012-08-06 (×7): 2 via ORAL
  Filled 2012-08-02 (×8): qty 2

## 2012-08-02 MED ORDER — HALOPERIDOL LACTATE 5 MG/ML IJ SOLN
INTRAMUSCULAR | Status: AC
  Filled 2012-08-02: qty 1

## 2012-08-02 MED ORDER — MORPHINE SULFATE 2 MG/ML IJ SOLN
2.0000 mg | INTRAMUSCULAR | Status: DC | PRN
  Administered 2012-08-02: 4 mg via INTRAVENOUS
  Administered 2012-08-02: 2 mg via INTRAVENOUS
  Administered 2012-08-03 – 2012-08-04 (×5): 4 mg via INTRAVENOUS
  Filled 2012-08-02 (×6): qty 2

## 2012-08-02 MED ORDER — HALOPERIDOL LACTATE 5 MG/ML IJ SOLN
5.0000 mg | Freq: Once | INTRAMUSCULAR | Status: AC
  Administered 2012-08-02: 5 mg via INTRAVENOUS

## 2012-08-02 MED ORDER — IOHEXOL 300 MG/ML  SOLN
100.0000 mL | Freq: Once | INTRAMUSCULAR | Status: AC | PRN
  Administered 2012-08-02: 100 mL via INTRAVENOUS

## 2012-08-02 MED ORDER — HALOPERIDOL LACTATE 5 MG/ML IJ SOLN: 4.0000 mg | INTRAMUSCULAR | Status: DC | PRN

## 2012-08-02 MED ORDER — POTASSIUM CHLORIDE 10 MEQ/100ML IV SOLN
10.0000 meq | INTRAVENOUS | Status: AC
  Administered 2012-08-02 (×4): 10 meq via INTRAVENOUS
  Filled 2012-08-02: qty 100

## 2012-08-02 MED ORDER — ACETAMINOPHEN 325 MG PO TABS
650.0000 mg | ORAL_TABLET | ORAL | Status: DC | PRN
  Administered 2012-08-03: 650 mg via ORAL
  Filled 2012-08-02: qty 2

## 2012-08-02 MED ORDER — HALOPERIDOL LACTATE 5 MG/ML IJ SOLN
4.0000 mg | Freq: Once | INTRAMUSCULAR | Status: AC
  Administered 2012-08-02: 4 mg via INTRAVENOUS

## 2012-08-02 MED ORDER — MORPHINE SULFATE 2 MG/ML IJ SOLN
1.0000 mg | INTRAMUSCULAR | Status: DC | PRN
  Administered 2012-08-02 (×2): 2 mg via INTRAVENOUS
  Filled 2012-08-02: qty 2
  Filled 2012-08-02 (×2): qty 1

## 2012-08-02 NOTE — Progress Notes (Signed)
Subjective: 2 Days Post-Op Procedure(s) (LRB): INTRAMEDULLARY (IM) NAIL TIBIAL (Left) Patient extubated this morning but got haldol this afternoon for confusion and agitation.  Objective: Vital signs in last 24 hours: Temp:  [98.2 F (36.8 C)-101.1 F (38.4 C)] 98.2 F (36.8 C) (12/26 1209) Pulse Rate:  [95-137] 131  (12/26 1530) Resp:  [15-26] 20  (12/26 1530) BP: (89-149)/(37-87) 149/87 mmHg (12/26 1500) SpO2:  [92 %-100 %] 95 % (12/26 1530) FiO2 (%):  [39.8 %-40.3 %] 40 % (12/26 0900)  Intake/Output from previous day: 12/25 0701 - 12/26 0700 In: 4960.8 [I.V.:3197.5; Blood:263.3; NG/GT:90; IV Piggyback:1410] Out: 1780 [Urine:1580; Emesis/NG output:200] Intake/Output this shift: Total I/O In: 1243.2 [P.O.:120; I.V.:951.5; Blood:41.7; NG/GT:30; IV Piggyback:100] Out: 1675 [Urine:1425; Emesis/NG output:250]   Basename 08/02/12 1109 08/01/12 0230 07/31/12 1933  HGB 9.1* 10.8* 15.9    Basename 08/02/12 1109 08/02/12 0250 08/01/12 0230  WBC 8.6 -- 8.7  RBC 2.94* -- 3.49*  HCT 26.5* 22.9* --  PLT 65* -- 96*    Basename 08/02/12 0250 08/01/12 0230  NA 133* 139  K 3.1* 3.4*  CL 100 104  CO2 27 25  BUN 8 8  CREATININE 0.86 0.89  GLUCOSE 133* 127*  CALCIUM 7.2* 7.2*    Basename 08/01/12 0230 07/31/12 1933  LABPT -- --  INR 1.21 1.02    L LE splinted.  Splint intact.  Toes well perfused.  2+ dp pulse.  No gross deformity elsewhere.  Assessment/Plan: 2 Days Post-Op Procedure(s) (LRB): INTRAMEDULLARY (IM) NAIL TIBIAL (Left)  1.  L open grade 3A tibia fracture - s/p IM nail 12/25.  NWB L LE.  2.  L superior and inferior pubic ramus fractures - he'll need AP pelvis / inlet / outlet views after he's been up and around with PT.  3.  Secondary survey when he's able to respond to questions.  Toni Arthurs 08/02/2012, 4:10 PM

## 2012-08-02 NOTE — Progress Notes (Signed)
Follow up - Trauma and Critical Care  Patient Details:    Troy Bishop is an 21 y.o. male.  Lines/tubes : Airway 7.5 mm (Active)  Secured at (cm) 23 cm 08/02/2012  4:05 AM  Measured From Lips 08/02/2012  4:05 AM  Secured Location Center 08/02/2012  4:05 AM  Secured By [employer] 08/02/2012  4:05 AM  Tube Holder Repositioned Yes 08/02/2012  4:05 AM  Cuff Pressure (cm H2O) 22 cm H2O 08/01/2012 11:12 PM  Site Condition Dry 08/01/2012  3:02 PM     NG/OG Tube Orogastric 14 Fr. Center mouth (Active)  Placement Verification Auscultation 08/01/2012  8:20 PM  Site Assessment Clean;Dry;Intact 08/01/2012  8:20 PM  Status Suction-low intermittent 08/01/2012  8:20 PM  Drainage Appearance Manson Passey 08/01/2012  8:20 PM  Intake (mL) 30 mL 08/01/2012  4:00 PM  Output (mL) 200 mL 08/02/2012 12:00 AM     Urethral Catheter Temperature probe 14 Fr. (Active)  Indication for Insertion or Continuance of Catheter Urinary output monitoring 08/01/2012  8:20 PM  Site Assessment Clean;Intact 08/01/2012  8:20 PM  Collection Container Standard drainage bag 08/01/2012  8:20 PM  Securement Method Leg strap 08/01/2012  8:20 PM  Output (mL) 170 mL 08/02/2012  6:00 AM    Microbiology/Sepsis markers: Results for orders placed during the hospital encounter of 07/31/12  URINE CULTURE     Status: Normal   Collection Time   07/31/12  7:53 PM      Component Value Range Status Comment   Specimen Description URINE, CATHETERIZED   Final    Special Requests NONE   Final    Culture  Setup Time 07/31/2012 20:38   Final    Colony Count NO GROWTH   Final    Culture NO GROWTH   Final    Report Status 08/02/2012 FINAL   Final   MRSA PCR SCREENING     Status: Normal   Collection Time   08/01/12  2:00 AM      Component Value Range Status Comment   MRSA by PCR NEGATIVE  NEGATIVE Final     Anti-infectives:  Anti-infectives     Start     Dose/Rate Route Frequency Ordered Stop   08/01/12 0600   ceFAZolin (ANCEF)  IVPB 1 g/50 mL premix        1 g 100 mL/hr over 30 Minutes Intravenous Every 6 hours 08/01/12 0215 08/01/12 1730          Best Practice/Protocols:  VTE Prophylaxis: Mechanical GI Prophylaxis: Proton Pump Inhibitor Continous Sedation  Consults: Treatment Team:  Toni Arthurs, MD    Events:  Subjective:    Overnight Issues: Has not been weaned.  No big overnight issues  Objective:  Vital signs for last 24 hours: Temp:  [98.1 F (36.7 C)-101.4 F (38.6 C)] 98.9 F (37.2 C) (12/26 0700) Pulse Rate:  [95-126] 103  (12/26 0715) Resp:  [13-27] 20  (12/26 0715) BP: (89-143)/(37-78) 96/51 mmHg (12/26 0715) SpO2:  [100 %] 100 % (12/26 0700) FiO2 (%):  [39.8 %-40.4 %] 40 % (12/26 0700)  Hemodynamic parameters for last 24 hours:    Intake/Output from previous day: 12/25 0701 - 12/26 0700 In: 4960.8 [I.V.:3197.5; Blood:263.3; NG/GT:90; IV Piggyback:1410] Out: 1780 [Urine:1580; Emesis/NG output:200]  Intake/Output this shift: Total I/O In: 41.7 [Blood:41.7] Out: -   Vent settings for last 24 hours: Vent Mode:  [-] PRVC FiO2 (%):  [39.8 %-40.4 %] 40 % Set Rate:  [16 bmp-20 bmp] 20 bmp Vt Set:  [  400 mL] 400 mL PEEP:  [5 cmH20] 5 cmH20 Plateau Pressure:  [10 cmH20-16 cmH20] 16 cmH20  Physical Exam:  General: no respiratory distress and still sedated on minimal sedation IV Neuro: nonfocal exam and RASS -2 Resp: clear to auscultation bilaterally GI: soft, nontender, BS WNL, no r/g and Total OGT output 300cc  Results for orders placed during the hospital encounter of 07/31/12 (from the past 24 hour(s))  HEMATOCRIT     Status: Abnormal   Collection Time   08/01/12  9:47 AM      Component Value Range   HCT 27.4 (*) 39.0 - 52.0 %  POCT I-STAT 3, BLOOD GAS (G3+)     Status: Abnormal   Collection Time   08/01/12  4:24 PM      Component Value Range   pH, Arterial 7.310 (*) 7.350 - 7.450   pCO2 arterial 51.3 (*) 35.0 - 45.0 mmHg   pO2, Arterial 48.0 (*) 80.0 - 100.0  mmHg   Bicarbonate 25.8 (*) 20.0 - 24.0 mEq/L   TCO2 27  0 - 100 mmol/L   O2 Saturation 79.0     Acid-base deficit 1.0  0.0 - 2.0 mmol/L   Collection site RADIAL, ALLEN'S TEST ACCEPTABLE     Drawn by Operator     Sample type ARTERIAL    HEMATOCRIT     Status: Abnormal   Collection Time   08/01/12  8:04 PM      Component Value Range   HCT 26.8 (*) 39.0 - 52.0 %  CK     Status: Abnormal   Collection Time   08/01/12  8:04 PM      Component Value Range   Total CK 1510 (*) 7 - 232 U/L  HEMATOCRIT     Status: Abnormal   Collection Time   08/02/12  2:50 AM      Component Value Range   HCT 22.9 (*) 39.0 - 52.0 %  BASIC METABOLIC PANEL     Status: Abnormal   Collection Time   08/02/12  2:50 AM      Component Value Range   Sodium 133 (*) 135 - 145 mEq/L   Potassium 3.1 (*) 3.5 - 5.1 mEq/L   Chloride 100  96 - 112 mEq/L   CO2 27  19 - 32 mEq/L   Glucose, Bld 133 (*) 70 - 99 mg/dL   BUN 8  6 - 23 mg/dL   Creatinine, Ser 1.61  0.50 - 1.35 mg/dL   Calcium 7.2 (*) 8.4 - 10.5 mg/dL   GFR calc non Af Amer >90  >90 mL/min   GFR calc Af Amer >90  >90 mL/min  PREPARE RBC (CROSSMATCH)     Status: Normal   Collection Time   08/02/12  4:10 AM      Component Value Range   Order Confirmation ORDER PROCESSED BY BLOOD BANK       Assessment/Plan:   NEURO  Altered Mental Status:  sedation and language barrier   Plan: Wean sedation for extubation  PULM  No current issues.  CXR not done this morning,but does not need to be done befor extubatio   Plan: No specific issues  CARDIO  No issues except for mild sinus tachycardia   Plan: Watch and treat accordingly.  RENAL  No issues   Plan: CPM  GI  Grade I splenic laceration not currently bleeding.  CT negative from this morning.   Plan: CPM  ID  No issues   Plan: CPM  HEME  Anemia acute blood loss anemia) Thrombocytopenia (possibly consumptive.)   Plan: Check CBC with diff  ENDO No specific issues   Plan: CPM  Global Issues  In  spte of the language barrier I believe that we will be able to extubate this patient today.  The dropping Hct is an enigma.  No evidence of intra-abdominal bleeding on recent CT.  Perhaps this all related to the fractured leg.    LOS: 2 days   Additional comments:I reviewed the patient's new clinical lab test results. H&H/bmetCT abd/pel and I reviewed the patients new imaging test results. No intra-abdominal bleeding.  Critical Care Total Time*: 30 Minutes  Daleyssa Loiselle O 08/02/2012  *Care during the described time interval was provided by me and/or other providers on the critical care team.  I have reviewed this patient's available data, including medical history, events of note, physical examination and test results as part of my evaluation.

## 2012-08-02 NOTE — Procedures (Signed)
Extubation Procedure Note  Patient Details:   Name: Troy Bishop DOB: [birth year] MRN: [id number]   Airway Documentation:     Evaluation  O2 sats: stable throughout Complications: No apparent complications Patient did tolerate procedure well. Bilateral Breath Sounds: Clear;Diminished Suctioning: Oral Yes  Order to extubate per MD. Pt positive for cuff leak, extubated to 3lpm [place]. Pt resting comfortably at this time, no strider heard, vitals within normal limits. RT will continue to monitor.   Parke Poisson 08/02/2012, 9:48 AM

## 2012-08-02 NOTE — Progress Notes (Signed)
Dr. Dwain Sarna notified of hematocrit 22.9. Order for 1 unit PRBC and an abdomen/pelvis CT with contrast. Spoke with wife about need for transfusion and CT. She gave consent for blood products; confirmed he is not allergic to anything. Answered questions that wife and mother had.  Thresa Ross RN
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
## 2012-08-02 NOTE — Progress Notes (Signed)
UR completed 

## 2012-08-02 NOTE — Progress Notes (Signed)
eLink Physician-Brief Progress Note Patient Name: Troy Bishop DOB: 08-28-1990 MRN: [id number]  Date of Service  08/02/2012   HPI/Events of Note  Hypokalemia   eICU Interventions  Potassium replaced   Intervention Category Intermediate Interventions: Electrolyte abnormality - evaluation and management  Troy Bishop 08/02/2012, 4:02 AM

## 2012-08-02 NOTE — Progress Notes (Signed)
Patient ID: Troy Bishop, male   DOB: 10/28/1990, 21 y.o.   MRN: [id number] Stable, extubated. Moves all 4 limbs. C/o headache. Spoke with him thru a family member. Ct some slight increase of blood left anterior lobe. No shift

## 2012-08-02 NOTE — Progress Notes (Signed)
Patient extubated and IV sedation discontinued.  40 ml IV fentanyl and 30 ml IV versed wasted in sink and verified by second RN, Dahlia Bailiff.  Keitha Butte, RN

## 2012-08-03 ENCOUNTER — Inpatient Hospital Stay (HOSPITAL_COMMUNITY): Payer: Medicaid Other

## 2012-08-03 LAB — CBC WITH DIFFERENTIAL/PLATELET
Basophils Relative: 0 % (ref 0–1)
HCT: 23.7 % — ABNORMAL LOW (ref 39.0–52.0)
Hemoglobin: 8.3 g/dL — ABNORMAL LOW (ref 13.0–17.0)
Lymphs Abs: 0.7 10*3/uL (ref 0.7–4.0)
MCH: 31.2 pg (ref 26.0–34.0)
MCHC: 35 g/dL (ref 30.0–36.0)
Monocytes Absolute: 0.6 10*3/uL (ref 0.1–1.0)
Monocytes Relative: 8 % (ref 3–12)
Neutro Abs: 5.8 10*3/uL (ref 1.7–7.7)

## 2012-08-03 LAB — TYPE AND SCREEN
ABO/RH(D): A POS
Antibody Screen: NEGATIVE
Unit division: 0
Unit division: 0

## 2012-08-03 LAB — BASIC METABOLIC PANEL
BUN: 4 mg/dL — ABNORMAL LOW (ref 6–23)
Chloride: 104 mEq/L (ref 96–112)
Creatinine, Ser: 0.77 mg/dL (ref 0.50–1.35)
GFR calc Af Amer: >90 mL/min (ref >90)
Glucose, Bld: 133 mg/dL — ABNORMAL HIGH (ref 70–99)

## 2012-08-03 NOTE — Progress Notes (Signed)
Rehab Admissions Coordinator Note:  Patient was screened by Trish Mage for appropriateness for an Inpatient Acute Rehab Consult.  At this time, we are recommending Inpatient Rehab consult.  Noted PT and OT recommending CIR.  Trish Mage 08/03/2012, 3:31 PM  I can be reached at [phone].
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
## 2012-08-03 NOTE — Progress Notes (Signed)
Pt admitted into room 3310 with belongings and wife at bedside. VSS. [facility] notified about transfer. Will continue to monitor.
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
## 2012-08-03 NOTE — Progress Notes (Signed)
Patient ID: Troy Bishop, male   DOB: 05-Nov-1990, 21 y.o.   MRN: [id number] Stable, movas all 4 extremities. Cast in led. Decrease of facial swelling. Ct head in am

## 2012-08-03 NOTE — Progress Notes (Signed)
Subjective: 3 Days Post-Op Procedure(s) (LRB): INTRAMEDULLARY (IM) NAIL TIBIAL (Left) Patient reports pain as mild.   Denies pain at left knee.  Denies pain anywhere but left leg.  Still has c collar on.  Objective: Vital signs in last 24 hours: Temp:  [98.3 F (36.8 C)-99.6 F (37.6 C)] 98.3 F (36.8 C) (12/27 1136) Pulse Rate:  [90-131] 100  (12/27 1100) Resp:  [15-26] 20  (12/27 1100) BP: (96-149)/(48-87) 125/83 mmHg (12/27 1100) SpO2:  [92 %-100 %] 96 % (12/27 1100)  Intake/Output from previous day: 12/26 0701 - 12/27 0700 In: 2903.2 [P.O.:180; I.V.:2551.5; Blood:41.7; NG/GT:30; IV Piggyback:100] Out: 5245 [Urine:4995; Emesis/NG output:250] Intake/Output this shift: Total I/O In: 300 [I.V.:300] Out: 675 [Urine:675]   Basename 08/03/12 0545 08/02/12 1109 08/01/12 0230 07/31/12 1933  HGB 8.3* 9.1* 10.8* 15.9    Basename 08/03/12 0545 08/02/12 1109  WBC 7.1 8.6  RBC 2.66* 2.94*  HCT 23.7* 26.5*  PLT 68* 65*    Basename 08/03/12 0545 08/02/12 0250  NA 140 133*  K 3.5 3.1*  CL 104 100  CO2 28 27  BUN 4* 8  CREATININE 0.77 0.86  GLUCOSE 133* 133*  CALCIUM 8.4 7.2*    Basename 08/01/12 0230 07/31/12 1933  LABPT -- --  INR 1.21 1.02    L knee dressedand dry.  0-90 deg rom.  L leg splinted.  toes with brisk cap refill.  Assessment/Plan: 3 Days Post-Op Procedure(s) (LRB): INTRAMEDULLARY (IM) NAIL TIBIAL (Left) Up with therapy  NWB L LE. Will continue to follow with you.  Toni Arthurs 08/03/2012, 1:57 PM
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
## 2012-08-04 LAB — CBC
Hemoglobin: 8.9 g/dL — ABNORMAL LOW (ref 13.0–17.0)
MCHC: 34.5 g/dL (ref 30.0–36.0)
RDW: 13.5 % (ref 11.5–15.5)

## 2012-08-04 LAB — BASIC METABOLIC PANEL
GFR calc Af Amer: >90 mL/min (ref >90)
GFR calc non Af Amer: >90 mL/min (ref >90)
Glucose, Bld: 115 mg/dL — ABNORMAL HIGH (ref 70–99)
Potassium: 3.4 mEq/L — ABNORMAL LOW (ref 3.5–5.1)
Sodium: 139 mEq/L (ref 135–145)

## 2012-08-04 MED ORDER — SODIUM CHLORIDE 0.9 % IJ SOLN: 3.0000 mL | INTRAMUSCULAR | Status: DC | PRN
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
## 2012-08-05 MED ORDER — PANTOPRAZOLE SODIUM 40 MG PO TBEC
40.0000 mg | DELAYED_RELEASE_TABLET | Freq: Every day | ORAL | Status: DC
  Administered 2012-08-05: 40 mg via ORAL
  Filled 2012-08-05: qty 1

## 2012-08-05 NOTE — Progress Notes (Signed)
Subjective: 5 Days Post-Op Procedure(s) (LRB): INTRAMEDULLARY (IM) NAIL TIBIAL (Left) Patient reports pain as moderate left lower leg. Denies knee or ankle pain. No other complaints.  Objective: Vital signs in last 24 hours: Temp:  [97.8 F (36.6 C)-99.5 F (37.5 C)] 97.8 F (36.6 C) (12/29 1149) Pulse Rate:  [70-90] 70  (12/29 0752) Resp:  [15-21] 18  (12/29 0752) BP: (106-130)/(62-83) 106/62 mmHg (12/29 0752) SpO2:  [98 %-100 %] 100 % (12/29 0752)  Intake/Output from previous day: 12/28 0701 - 12/29 0700 In: 1145 [P.O.:585; I.V.:550; IV Piggyback:10] Out: 500 [Urine:500] Intake/Output this shift:     Basename 08/04/12 0525 08/03/12 0545  HGB 8.9* 8.3*    Basename 08/04/12 0525 08/03/12 0545  WBC 6.5 7.1  RBC 2.90* 2.66*  HCT 25.8* 23.7*  PLT 96* 68*    Basename 08/04/12 0525 08/03/12 0545  NA 139 140  K 3.4* 3.5  CL 104 104  CO2 26 28  BUN 5* 4*  CREATININE 0.74 0.77  GLUCOSE 115* 133*  CALCIUM 8.7 8.4   No results found for this basename: LABPT:2,INR:2 in the last 72 hours  Neurologically intact Neurovascular intact Sensation intact distally Intact pulses distally Incision: dressing C/D/I Compartments soft  Assessment/Plan: 5 Days Post-Op Procedure(s) (LRB): INTRAMEDULLARY (IM) NAIL TIBIAL (Left) Up with therapy NWB LLE  Troy Bishop M. 08/05/2012, 1:02 PM

## 2012-08-05 NOTE — Progress Notes (Signed)
Speech Language Pathology Dysphagia Treatment Patient Details Name: Troy Bishop MRN: [id number] DOB: [birth year] Today's Date: 08/05/2012 Time: 1400-1430 SLP Time Calculation (min): 30 min  Assessment / Plan / Recommendation Clinical Impression  Focuse of treatment diet tolerance of mechanical soft consistency and thin liquids following initiating diet  on 08/03/12.  Patient observed with thin liquid by cup ( SLP removed straw prior to administration per recommended precautions).   Coughing noted immediately after swallows in succession with thin liquid due to too large of sips.  No outward s/s of aspiration observed with small, controlled sips with moderate verbal and visual cueing required.  Spouse demonstrated understanding of recommended precautions and strategies to improve safety of the swallow.  ST to continue in acute care setting for diet advancement.      Diet Recommendation  Continue with Current Diet: Dysphagia 3 (mechanical soft);Thin liquid    SLP Plan Continue with current plan of care      Swallowing Goals  SLP Swallowing Goals Swallow Study Goal #2 - Progress: Progressing toward goal  General Temperature Spikes Noted: No Respiratory Status: Room air Behavior/Cognition: Alert;Cooperative;Pleasant mood;Impulsive Oral Cavity - Dentition: Adequate natural dentition Patient Positioning: Upright in bed  Oral Cavity - Oral Hygiene Does patient have any of the following "at risk" factors?: None of the above Brush patient's teeth BID with toothbrush (using toothpaste with fluoride): Yes   Dysphagia Treatment Treatment focused on: Skilled observation of diet tolerance;Patient/family/caregiver education;Utilization of compensatory strategies;Facilitation of pharyngeal phase Family/Caregiver Educated: Spouse Treatment Methods/Modalities: Skilled observation Patient observed directly with PO's: Yes Type of PO's observed: Thin liquids Liquids provided via: Cup Pharyngeal Phase  Signs & Symptoms: Suspected delayed swallow initiation;Immediate cough Type of cueing: Verbal;Visual Amount of cueing: Moderate   GO    Moreen Fowler MS, CCC-SLP [phone] [facility] 08/05/2012, 4:24 PM
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
## 2012-08-06 ENCOUNTER — Encounter (HOSPITAL_COMMUNITY): Payer: Self-pay | Admitting: Orthopedic Surgery

## 2012-08-06 ENCOUNTER — Inpatient Hospital Stay (HOSPITAL_COMMUNITY)
Admission: RE | Admit: 2012-08-06 | Discharge: 2012-08-10 | DRG: 945 | Disposition: A | Discharge status: Home Health | Payer: Medicaid Other | Source: Ambulatory Visit | Attending: Physical Medicine & Rehabilitation | Admitting: Physical Medicine & Rehabilitation

## 2012-08-06 ENCOUNTER — Inpatient Hospital Stay (HOSPITAL_COMMUNITY): Payer: Medicaid Other

## 2012-08-06 DIAGNOSIS — S069XAA Unspecified intracranial injury with loss of consciousness status unknown, initial encounter: Secondary | ICD-10-CM

## 2012-08-06 DIAGNOSIS — S069X9A Unspecified intracranial injury with loss of consciousness of unspecified duration, initial encounter: Secondary | ICD-10-CM | POA: Diagnosis present

## 2012-08-06 DIAGNOSIS — K5900 Constipation, unspecified: Secondary | ICD-10-CM

## 2012-08-06 DIAGNOSIS — S36039A Unspecified laceration of spleen, initial encounter: Secondary | ICD-10-CM | POA: Diagnosis present

## 2012-08-06 DIAGNOSIS — S3282XA Multiple fractures of pelvis without disruption of pelvic ring, initial encounter for closed fracture: Secondary | ICD-10-CM

## 2012-08-06 DIAGNOSIS — S3210XA Unspecified fracture of sacrum, initial encounter for closed fracture: Secondary | ICD-10-CM | POA: Diagnosis present

## 2012-08-06 DIAGNOSIS — D62 Acute posthemorrhagic anemia: Secondary | ICD-10-CM

## 2012-08-06 DIAGNOSIS — S065XAA Traumatic subdural hemorrhage with loss of consciousness status unknown, initial encounter: Secondary | ICD-10-CM

## 2012-08-06 DIAGNOSIS — S0280XA Fracture of other specified skull and facial bones, unspecified side, initial encounter for closed fracture: Secondary | ICD-10-CM

## 2012-08-06 DIAGNOSIS — Z5189 Encounter for other specified aftercare: Principal | ICD-10-CM

## 2012-08-06 DIAGNOSIS — S82209B Unspecified fracture of shaft of unspecified tibia, initial encounter for open fracture type I or II: Secondary | ICD-10-CM

## 2012-08-06 DIAGNOSIS — IMO0002 Reserved for concepts with insufficient information to code with codable children: Secondary | ICD-10-CM

## 2012-08-06 DIAGNOSIS — S32509A Unspecified fracture of unspecified pubis, initial encounter for closed fracture: Secondary | ICD-10-CM

## 2012-08-06 DIAGNOSIS — S82202B Unspecified fracture of shaft of left tibia, initial encounter for open fracture type I or II: Secondary | ICD-10-CM | POA: Diagnosis present

## 2012-08-06 DIAGNOSIS — S065X9A Traumatic subdural hemorrhage with loss of consciousness of unspecified duration, initial encounter: Secondary | ICD-10-CM

## 2012-08-06 DIAGNOSIS — S82109A Unspecified fracture of upper end of unspecified tibia, initial encounter for closed fracture: Secondary | ICD-10-CM

## 2012-08-06 DIAGNOSIS — S82409A Unspecified fracture of shaft of unspecified fibula, initial encounter for closed fracture: Secondary | ICD-10-CM

## 2012-08-06 DIAGNOSIS — S32599A Other specified fracture of unspecified pubis, initial encounter for closed fracture: Secondary | ICD-10-CM | POA: Diagnosis present

## 2012-08-06 DIAGNOSIS — D696 Thrombocytopenia, unspecified: Secondary | ICD-10-CM | POA: Diagnosis not present

## 2012-08-06 DIAGNOSIS — Z9889 Other specified postprocedural states: Secondary | ICD-10-CM

## 2012-08-06 DIAGNOSIS — S82839A Other fracture of upper and lower end of unspecified fibula, initial encounter for closed fracture: Secondary | ICD-10-CM

## 2012-08-06 DIAGNOSIS — E876 Hypokalemia: Secondary | ICD-10-CM

## 2012-08-06 DIAGNOSIS — F17200 Nicotine dependence, unspecified, uncomplicated: Secondary | ICD-10-CM

## 2012-08-06 DIAGNOSIS — S3609XA Other injury of spleen, initial encounter: Secondary | ICD-10-CM

## 2012-08-06 LAB — CBC
MCH: 30.7 pg (ref 26.0–34.0)
MCHC: 34.4 g/dL (ref 30.0–36.0)
Platelets: 210 10*3/uL (ref 150–400)
RDW: 13.1 % (ref 11.5–15.5)

## 2012-08-06 MED ORDER — MORPHINE SULFATE 2 MG/ML IJ SOLN
2.0000 mg | INTRAMUSCULAR | Status: DC | PRN
  Administered 2012-08-06: 2 mg via INTRAVENOUS
  Filled 2012-08-06: qty 1

## 2012-08-06 MED ORDER — TRAZODONE HCL 50 MG PO TABS
50.0000 mg | ORAL_TABLET | Freq: Every day | ORAL | Status: DC
  Administered 2012-08-07 – 2012-08-09 (×4): 50 mg via ORAL
  Filled 2012-08-06 (×4): qty 1

## 2012-08-06 MED ORDER — POLYETHYLENE GLYCOL 3350 17 G PO PACK
17.0000 g | PACK | Freq: Every day | ORAL | Status: DC
  Administered 2012-08-06: 17 g via ORAL
  Filled 2012-08-06: qty 1

## 2012-08-06 MED ORDER — DIPHENHYDRAMINE HCL 12.5 MG/5ML PO ELIX
12.5000 mg | ORAL_SOLUTION | Freq: Four times a day (QID) | ORAL | Status: DC | PRN
  Administered 2012-08-08: 12.5 mg via ORAL
  Administered 2012-08-09 – 2012-08-10 (×2): 25 mg via ORAL
  Filled 2012-08-06 (×3): qty 10

## 2012-08-06 MED ORDER — GUAIFENESIN-DM 100-10 MG/5ML PO SYRP: 5.0000 mL | ORAL_SOLUTION | Freq: Four times a day (QID) | ORAL | Status: DC | PRN

## 2012-08-06 MED ORDER — OXYCODONE HCL 5 MG PO TABS
10.0000 mg | ORAL_TABLET | ORAL | Status: DC | PRN
  Administered 2012-08-07 – 2012-08-10 (×9): 10 mg via ORAL
  Filled 2012-08-06 (×3): qty 2
  Filled 2012-08-06: qty 1
  Filled 2012-08-06 (×5): qty 2
  Filled 2012-08-06: qty 1

## 2012-08-06 MED ORDER — FLEET ENEMA 7-19 GM/118ML RE ENEM: 1.0000 | ENEMA | Freq: Once | RECTAL | Status: AC | PRN

## 2012-08-06 MED ORDER — POLYSACCHARIDE IRON COMPLEX 150 MG PO CAPS
150.0000 mg | ORAL_CAPSULE | Freq: Two times a day (BID) | ORAL | Status: DC
  Administered 2012-08-07 – 2012-08-10 (×6): 150 mg via ORAL
  Filled 2012-08-06 (×8): qty 1

## 2012-08-06 MED ORDER — TAB-A-VITE/IRON PO TABS
1.0000 | ORAL_TABLET | Freq: Every day | ORAL | Status: DC
  Administered 2012-08-07 – 2012-08-10 (×4): 1 via ORAL
  Filled 2012-08-06 (×5): qty 1

## 2012-08-06 MED ORDER — POLYETHYLENE GLYCOL 3350 17 G PO PACK
17.0000 g | PACK | Freq: Every day | ORAL | Status: DC
  Administered 2012-08-07 – 2012-08-09 (×3): 17 g via ORAL
  Filled 2012-08-06 (×4): qty 1

## 2012-08-06 MED ORDER — PROCHLORPERAZINE 25 MG RE SUPP
12.5000 mg | Freq: Four times a day (QID) | RECTAL | Status: DC | PRN
  Filled 2012-08-06: qty 1

## 2012-08-06 MED ORDER — WHITE PETROLATUM GEL
Status: AC
  Filled 2012-08-06: qty 5

## 2012-08-06 MED ORDER — HYDROCODONE-ACETAMINOPHEN 5-325 MG PO TABS: 0.5000 | ORAL_TABLET | ORAL | Status: DC | PRN

## 2012-08-06 MED ORDER — ENOXAPARIN SODIUM 40 MG/0.4ML ~~LOC~~ SOLN
40.0000 mg | SUBCUTANEOUS | Status: DC
  Administered 2012-08-07 – 2012-08-10 (×4): 40 mg via SUBCUTANEOUS
  Filled 2012-08-06 (×4): qty 0.4

## 2012-08-06 MED ORDER — PROCHLORPERAZINE EDISYLATE 5 MG/ML IJ SOLN
5.0000 mg | Freq: Four times a day (QID) | INTRAMUSCULAR | Status: DC | PRN
  Filled 2012-08-06: qty 2

## 2012-08-06 MED ORDER — METHOCARBAMOL 500 MG PO TABS: 500.0000 mg | ORAL_TABLET | Freq: Four times a day (QID) | ORAL | Status: DC | PRN

## 2012-08-06 MED ORDER — OXYCODONE HCL 5 MG PO TABS
10.0000 mg | ORAL_TABLET | Freq: Two times a day (BID) | ORAL | Status: DC
  Administered 2012-08-07 – 2012-08-10 (×7): 10 mg via ORAL
  Filled 2012-08-06 (×8): qty 2

## 2012-08-06 MED ORDER — BISACODYL 10 MG RE SUPP: 10.0000 mg | Freq: Every day | RECTAL | Status: DC | PRN

## 2012-08-06 MED ORDER — ALUM & MAG HYDROXIDE-SIMETH 200-200-20 MG/5ML PO SUSP: 30.0000 mL | ORAL | Status: DC | PRN

## 2012-08-06 MED ORDER — DOCUSATE SODIUM 100 MG PO CAPS
100.0000 mg | ORAL_CAPSULE | Freq: Two times a day (BID) | ORAL | Status: DC
  Administered 2012-08-06: 100 mg via ORAL
  Filled 2012-08-06: qty 1

## 2012-08-06 MED ORDER — TRAMADOL HCL 50 MG PO TABS
100.0000 mg | ORAL_TABLET | Freq: Four times a day (QID) | ORAL | Status: DC
  Administered 2012-08-06 (×3): 100 mg via ORAL
  Filled 2012-08-06: qty 1
  Filled 2012-08-06 (×2): qty 2

## 2012-08-06 MED ORDER — ENOXAPARIN SODIUM 40 MG/0.4ML ~~LOC~~ SOLN
40.0000 mg | Freq: Every day | SUBCUTANEOUS | Status: DC
  Administered 2012-08-06: 40 mg via SUBCUTANEOUS
  Filled 2012-08-06: qty 0.4

## 2012-08-06 MED ORDER — ACETAMINOPHEN 325 MG PO TABS: 325.0000 mg | ORAL_TABLET | ORAL | Status: DC | PRN

## 2012-08-06 MED ORDER — PROCHLORPERAZINE MALEATE 5 MG PO TABS
5.0000 mg | ORAL_TABLET | Freq: Four times a day (QID) | ORAL | Status: DC | PRN
  Filled 2012-08-06: qty 2

## 2012-08-06 MED ORDER — ALBUTEROL SULFATE (5 MG/ML) 0.5% IN NEBU: 2.5000 mg | INHALATION_SOLUTION | RESPIRATORY_TRACT | Status: DC | PRN

## 2012-08-06 NOTE — Progress Notes (Signed)
Orthopedic Tech Progress Note Patient Details:  Troy Bishop Oct 22, 1990 [id number] Order for short leg cast placed in "other orders" section of chart. Spoke with doctor and patient is to be in neutral position for casting. Short Leg cast applied to Left LE with assistance of bedside nurse. Patient tolerated well. Nurse stated that doctor also asked for "boot" for patient. No order for boot found. Nurse was asked to contact doctor to find out specifically what kind of boot he wants for the patient and to place an order and Ortho Tech will come back to fit. Casting Type of Cast: Short leg cast Cast Location: Left LE Cast Material: Fiberglass Cast Intervention: Application     Asia R Thompson 08/06/2012, 8:36 AM
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
## 2012-08-06 NOTE — Discharge Summary (Signed)
The patient should do well in Rehab.

## 2012-08-06 NOTE — Progress Notes (Signed)
Physical Therapy Treatment Patient Details Name: Troy Bishop MRN: [id number] DOB: 11-Apr-1991 Today's Date: 08/06/2012 Time: 8295-6213 PT Time Calculation (min): 24 min  PT Assessment / Plan / Recommendation Comments on Treatment Session  Patient progressing well with mobility, still having cognitive deficits and little carryover of new information. Patient needing constant tactile and verbal cueing to maintain precautions. Patient confused but appropriate this session. Planned for CIR later today    Follow Up Recommendations  CIR     Does the patient have the potential to tolerate intense rehabilitation     Barriers to Discharge        Equipment Recommendations  Rolling walker with 5" wheels    Recommendations for Other Services    Frequency Min 6X/week   Plan Discharge plan remains appropriate;Frequency remains appropriate    Precautions / Restrictions Precautions Precautions: Fall Required Braces or Orthoses: Other Brace/Splint Other Brace/Splint: LLE cast and cast shoe Restrictions LLE Weight Bearing: Touchdown weight bearing (per lastest ortho note)   Pertinent Vitals/Pain     Mobility  Bed Mobility Bed Mobility: Supine to Sit Supine to Sit: 5: Supervision Transfers Sit to Stand: With upper extremity assist;From bed;From chair/3-in-1;4: Min assist Stand to Sit: With upper extremity assist;4: Min assist;To chair/3-in-1 Details for Transfer Assistance: Cues for safe technique and hand placement. A for stability and to ensure correct positioning prior to sitting as patient wanting to sit too soon and at risk for falling Ambulation/Gait Ambulation/Gait Assistance: 3: Mod assist Ambulation Distance (Feet): 20 Feet Assistive device: Rolling walker Ambulation/Gait Assistance Details: A for holding up of LLE at times and A for stability adn safety with RW usage. Constant cues for weight bearing status as patient at times wanting to put all his weight through LLE.  Gait  Pattern: Step-to pattern;Trunk flexed    Exercises     PT Diagnosis:    PT Problem List:   PT Treatment Interventions:     PT Goals Acute Rehab PT Goals PT Goal: Supine/Side to Sit - Progress: Progressing toward goal PT Goal: Sit to Stand - Progress: Progressing toward goal PT Goal: Ambulate - Progress: Progressing toward goal  Visit Information  Last PT Received On: 08/06/12 Assistance Needed: +2 (helpful for ambulation and safety)    Subjective Data      Cognition  Overall Cognitive Status: Impaired Area of Impairment: Attention;Memory;Following commands;Awareness of errors;Safety/judgement;Awareness of deficits;Problem solving Arousal/Alertness: Awake/alert Orientation Level: Disoriented X4;Situation Behavior During Session: Restless (impulsice) Current Attention Level: Sustained Memory: Decreased recall of precautions Memory Deficits: carryover of information still lacking, requiring constant verbal and tactile cues for weightbearing status Following Commands: Follows one step commands inconsistently Safety/Judgement: Decreased awareness of safety precautions;Decreased safety judgement for tasks assessed;Impulsive;Decreased awareness of need for assistance Awareness of Errors: Assistance required to identify errors made;Assistance required to correct errors made    Balance     End of Session PT - End of Session Equipment Utilized During Treatment: Gait belt Activity Tolerance: Patient tolerated treatment well Patient left: in chair;with call bell/phone within reach;with family/visitor present Nurse Communication: Mobility status   GP     Fredrich Birks 08/06/2012, 2:49 PM 08/06/2012 Fredrich Birks PTA [phone] pager [phone] office
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
## 2012-08-06 NOTE — Progress Notes (Signed)
Patient ID: Troy Bishop, male   DOB: June 26, 1991, 21 y.o.   MRN: [id number] Patient physically doing better but sill he is having problems with his memory. He is going to be transferred to the rahabilitation unit. i will f/u prn
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
## 2012-08-06 NOTE — Progress Notes (Signed)
Patient being transferred to unit 4000 per MD order. Report called to [place], Charity fundraiser. Patient will transfer in wheel chair. All vital signs WNL.
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
## 2012-08-06 NOTE — Progress Notes (Signed)
Occupational Therapy Treatment Patient Details Name: Troy Bishop MRN: [id number] DOB: [birth year] Today's Date: 08/06/2012 Time: 4098-1191 OT Time Calculation (min): 24 min  OT Assessment / Plan / Recommendation Comments on Treatment Session Pt progressing with therapy. Spoke with wife regarding d/c plan. Continue to recommend CIR for d/c plan.     Follow Up Recommendations  CIR    Barriers to Discharge       Equipment Recommendations  None recommended by OT    Recommendations for Other Services Rehab consult  Frequency     Plan Discharge plan remains appropriate    Precautions / Restrictions Precautions Precautions: Fall Required Braces or Orthoses: Other Brace/Splint Other Brace/Splint: LLE cast and cast shoe Restrictions LLE Weight Bearing: Touchdown weight bearing   Pertinent Vitals/Pain Pt reports pain in neck and LLE but did not rate. Declines need for medication.     ADL  Grooming: Wash/dry hands;Min guard Where Assessed - Grooming: Supported standing Lower Body Bathing: Moderate assistance Where Assessed - Lower Body Bathing: Supported sit to stand Lower Body Dressing: Moderate assistance Where Assessed - Lower Body Dressing: Supported sit to Pharmacist, hospital: Minimal assistance Statistician Method: Sit to Barista: Regular height toilet Toileting - Clothing Manipulation and Hygiene: Minimal assistance Where Assessed - Engineer, mining and Hygiene: Standing Equipment Used: Rolling walker;Gait belt Transfers/Ambulation Related to ADLs: +2total (pt=80%) with RW ambulation. assist needed to lift LLE in air to demonstrate NWB as pt unable to understand this verbally. Pt then able to maintain for ambulation and was Mod A ADL Comments: Pt motivated to work with therapy. Requires cues to slow down with all movement. Also, cues to sequencing through toileting task.     OT Diagnosis:    OT Problem List:   OT Treatment  Interventions:     OT Goals Acute Rehab OT Goals Time For Goal Achievement: 08/17/12 ADL Goals Pt Will Perform Grooming: with set-up;Sitting, chair ADL Goal: Grooming - Progress: Progressing toward goals Pt Will Perform Upper Body Bathing: with supervision;Sitting, chair;Sitting, edge of bed Pt Will Perform Lower Body Bathing: with min assist;Sit to stand from chair;Sit to stand from bed Pt Will Transfer to Toilet: with min assist;Ambulation;Comfort height toilet;3-in-1 ADL Goal: Toilet Transfer - Progress: Progressing toward goals Pt Will Perform Toileting - Clothing Manipulation: with min assist;Standing ADL Goal: Toileting - Clothing Manipulation - Progress: Progressing toward goals Additional ADL Goal #1: Pt will sustain attention to familiar ADL task x 10 mins with no more than one cue ADL Goal: Additional Goal #1 - Progress: Progressing toward goals  Visit Information  Last OT Received On: 08/06/12 Assistance Needed: +2 (safety/lines)    Subjective Data      Prior Functioning       Cognition  Overall Cognitive Status: Impaired Area of Impairment: Attention;Memory;Following commands;Awareness of errors;Safety/judgement;Awareness of deficits;Problem solving Arousal/Alertness: Awake/alert Orientation Level: Disoriented X4;Situation Behavior During Session: Restless (impulsice) Current Attention Level: Sustained Memory: Decreased recall of precautions Memory Deficits: carryover of information still lacking, requiring constant verbal and tactile cues for weightbearing status Following Commands: Follows one step commands inconsistently Safety/Judgement: Decreased awareness of safety precautions;Decreased safety judgement for tasks assessed;Impulsive;Decreased awareness of need for assistance Awareness of Errors: Assistance required to identify errors made;Assistance required to correct errors made Cognition - Other Comments: Pt continues to impulsive with mobility- educated  need to slow down.     Mobility  Shoulder Instructions Bed Mobility Bed Mobility: Supine to Sit Supine to Sit: 5: Supervision Transfers Sit to  Stand: With upper extremity assist;From bed;From chair/3-in-1;4: Min assist Stand to Sit: With upper extremity assist;4: Min assist;To chair/3-in-1 Details for Transfer Assistance: Cues for safe technique and hand placement. A for stability and to ensure correct positioning prior to sitting as patient wanting to sit too soon and at risk for falling       Exercises      Balance     End of Session OT - End of Session Equipment Utilized During Treatment: Gait belt Activity Tolerance: Patient tolerated treatment well Patient left: in chair;with call bell/phone within reach;with family/visitor present Nurse Communication: Mobility status  GO     Daron Stutz 08/06/2012, 2:59 PM

## 2012-08-06 NOTE — Progress Notes (Signed)
I spoke with Dr. Victorino Dike who stated that is was not the worst thing in the workd for the patient to weight bear on his left leg.  I asked him to be clear about the weight-bearing status so that Rehab could work with the patient easily..  This patient has been seen and I agree with the findings and treatment plan.  Marta Lamas. Gae Bon, MD, FACS [phone] (pager) [phone] (direct pager) Trauma Surgeon
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
## 2012-08-06 NOTE — Progress Notes (Signed)
Speech Language Pathology Dysphagia Treatment Patient Details Name: Troy Bishop MRN: [id number] DOB: 11-26-1990 Today's Date: 08/06/2012 Time: 1478-2956 SLP Time Calculation (min): 14 min  Assessment / Plan / Recommendation Clinical Impression  Treatment session focused on skilled observation of tolerance of thin liquids. Pt demonstrated adequate function today with no significant signs of aspiration. Pt demonstrated ability to consume regular textures and reports he has been eating food from home that his family brought him without difficulty. Pt is safe to upgrade to a regular diet and thin liquids without restrictions. No SLP f/u required for swallowing, will continue to need cognitive therapy.     Diet Recommendation  Initiate / Change Diet: Regular;Thin liquid    SLP Plan Discharge SLP treatment due to (comment)   Pertinent Vitals/Pain NA   Swallowing Goals  SLP Swallowing Goals Patient will utilize recommended strategies during swallow to increase swallowing safety with: Moderate assistance Swallow Study Goal #2 - Progress: Met  General Temperature Spikes Noted: No Respiratory Status: Room air Behavior/Cognition: Alert;Cooperative;Pleasant mood;Impulsive Oral Cavity - Dentition: Adequate natural dentition Patient Positioning: Upright in chair  Oral Cavity - Oral Hygiene Does patient have any of the following "at risk" factors?: None of the above   Dysphagia Treatment Treatment focused on: Skilled observation of diet tolerance;Upgraded PO texture trials;Patient/family/caregiver education Family/Caregiver Educated: Spouse Treatment Methods/Modalities: Skilled observation Patient observed directly with PO's: Yes Type of PO's observed: Thin liquids Feeding: Able to feed self Liquids provided via: Cup;Straw   GO    Troy Ditty, MA CCC-SLP [phone]  Claudine Mouton 08/06/2012, 2:18 PM

## 2012-08-06 NOTE — Progress Notes (Signed)
Orthopedic Tech Progress Note Patient Details:  Troy Bishop 02/19/1991 [id number]  Ortho Devices Type of Ortho Device: Postop shoe/boot Ortho Device/Splint Location: left foot Ortho Device/Splint Interventions: Application   Lashawnta Burgert 08/06/2012, 1:33 PM

## 2012-08-06 NOTE — Progress Notes (Signed)
I met with patient and his wife and family friend at bedside. Pt will benefit from an inpt rehab admission. I spoke with Trauma PA and he is in agreement to admit today to CIR. [phone]

## 2012-08-07 ENCOUNTER — Inpatient Hospital Stay (HOSPITAL_COMMUNITY): Payer: Medicaid Other

## 2012-08-07 ENCOUNTER — Inpatient Hospital Stay (HOSPITAL_COMMUNITY): Payer: Medicaid Other | Admitting: Occupational Therapy

## 2012-08-07 ENCOUNTER — Inpatient Hospital Stay (HOSPITAL_COMMUNITY): Payer: Medicaid Other | Admitting: Physical Therapy

## 2012-08-07 ENCOUNTER — Inpatient Hospital Stay (HOSPITAL_COMMUNITY): Payer: Medicaid Other | Admitting: Speech Pathology

## 2012-08-07 DIAGNOSIS — S82839A Other fracture of upper and lower end of unspecified fibula, initial encounter for closed fracture: Secondary | ICD-10-CM

## 2012-08-07 DIAGNOSIS — S069X9A Unspecified intracranial injury with loss of consciousness of unspecified duration, initial encounter: Secondary | ICD-10-CM

## 2012-08-07 DIAGNOSIS — IMO0002 Reserved for concepts with insufficient information to code with codable children: Secondary | ICD-10-CM

## 2012-08-07 DIAGNOSIS — S82409A Unspecified fracture of shaft of unspecified fibula, initial encounter for closed fracture: Secondary | ICD-10-CM

## 2012-08-07 DIAGNOSIS — S82109A Unspecified fracture of upper end of unspecified tibia, initial encounter for closed fracture: Secondary | ICD-10-CM

## 2012-08-07 DIAGNOSIS — S069XAA Unspecified intracranial injury with loss of consciousness status unknown, initial encounter: Secondary | ICD-10-CM

## 2012-08-07 LAB — CBC WITH DIFFERENTIAL/PLATELET
Eosinophils Absolute: 0.2 10*3/uL (ref 0.0–0.7)
Hemoglobin: 10.4 g/dL — ABNORMAL LOW (ref 13.0–17.0)
Lymphocytes Relative: 14 % (ref 12–46)
Lymphs Abs: 1.3 10*3/uL (ref 0.7–4.0)
MCH: 30.1 pg (ref 26.0–34.0)
Monocytes Relative: 11 % (ref 3–12)
Neutro Abs: 6.6 10*3/uL (ref 1.7–7.7)
Neutrophils Relative %: 72 % (ref 43–77)
RBC: 3.46 MIL/uL — ABNORMAL LOW (ref 4.22–5.81)

## 2012-08-07 LAB — COMPREHENSIVE METABOLIC PANEL
AST: 32 U/L (ref 0–37)
CO2: 27 mEq/L (ref 19–32)
Calcium: 9.3 mg/dL (ref 8.4–10.5)
Creatinine, Ser: 0.85 mg/dL (ref 0.50–1.35)
GFR calc non Af Amer: >90 mL/min (ref >90)
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
## 2012-08-07 NOTE — Progress Notes (Signed)
Patient information reviewed and entered into eRehab system by Leandro Berkowitz, RN, CRRN, PPS Coordinator.  Information including medical coding and functional independence measure will be reviewed and updated through discharge.    
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
## 2012-08-07 NOTE — Progress Notes (Signed)
Occupational Therapy Session Note  Patient Details  Name: Troy Bishop MRN: [id number] Date of Birth: [birth year]  Today's Date: 08/07/2012 Time: 1000-1055 Time Calculation (min): 55 min  Short Term Goals: Week 1:  OT Short Term Goal 1 (Week 1): Pt will be oriented x3 wtih external aids with min A OT Short Term Goal 2 (Week 1): Pt will maintain weight bear precautions with min A wtih functional mobility  Skilled Therapeutic Interventions/Progress Updates:  Pt resting in bed with wife at bedside.  Pt sat EOB to perform dressing tasks.  Pt attempted to stand at EOB X 3 without RW and also using LLE to push up.  Pt amb with RW into hall before sitting in w/c to roll to ADL apartment to engage in functional ambulation, dynamic standing tasks, sit<>stand, RW safety.  Pt required max verbal cues sit<>stand techniques, safety awareness, and WBing precautions.  Pt practiced sit<>stand strategies/techniques X 6.  Pt continued to require verbal cues during all tasks.  Pt transitioned to gym to engage in dynamic standing tasks bouncing ball against mini trampoline.  Pt maintained standing balance with steady assist while resting RLE on therapist's foot to maintain WBing precautions.  Therapy Documentation Precautions:  Precautions Precautions: Fall Restrictions Weight Bearing Restrictions: Yes LLE Weight Bearing: Touchdown weight bearing Pain: Pain Assessment Pain Assessment: Faces Faces Pain Scale: Hurts a little bit Pain Type: Acute pain Pain Location: Leg Pain Orientation: Left Pain Intervention(s): RN made aware  See FIM for current functional status  Therapy/Group: Individual Therapy  Rich Brave 08/07/2012, 11:01 AM

## 2012-08-07 NOTE — Plan of Care (Signed)
Overall Plan of Care [facility]) Patient Details Name: Tildon Silveria MRN: [id number] DOB: 22-Sep-1990  Diagnosis:  TBI, polytrauma  Primary Diagnosis:    TBI (traumatic brain injury) Co-morbidities: post- bi pain, behavioral and cognitive issues  Functional Problem List  Patient demonstrates impairments in the following areas: Balance, Behavior, Cognition, Endurance, Motor, Pain, Safety and Sensory   Basic ADL's: grooming, bathing, dressing and toileting Advanced ADL's: simple meal preparation, full meal preparation, laundry and light housekeeping  Transfers:  bed mobility, bed to chair, toilet, tub/shower, car and furniture Locomotion:  ambulation, wheelchair mobility and stairs  Additional Impairments:  Functional use of upper extremity, Leisure Awareness and Discharge Disposition  Anticipated Outcomes Item Anticipated Outcome  Eating/Swallowing  Modified independent; regular, thin liquids  Basic self-care  supervision  Tolieting  supervision  Bowel/Bladder  Regular bowel pattern/ Continent of bowel and bladder  Transfers  supervision  Locomotion  supervision  Communication  Intermittent cues  Cognition  Min assist  Pain  Pain level < or =3  Safety/Judgment  Supervision  Other     Therapy Plan: PT Intensity: Minimum of 1-2 x/day ,45 to 90 minutes PT Frequency: 5 out of 7 days PT Duration Estimated Length of Stay: 5-7 days OT Intensity: Minimum of 1-2 x/day, 45 to 90 minutes OT Frequency: 5 out of 7 days OT Duration/Estimated Length of Stay: 7-10days SLP Intensity: Minumum of 1-2 x/day, 30 to 90 minutes SLP Frequency: 5 out of 7 days SLP Duration/Estimated Length of Stay: 7-10 days    Team Interventions: Item RN PT OT SLP SW TR Other  Self Care/Advanced ADL Retraining   x      Neuromuscular Re-Education  x x      Therapeutic Activities  x x      UE/LE Strength Training/ROM  x x      UE/LE Coordination Activities  x x      Visual/Perceptual  Remediation/Compensation         DME/Adaptive Equipment Instruction  x x      Therapeutic Exercise  x x      Balance/Vestibular Training  x x      Patient/Family Education x x x x     Cognitive Remediation/Compensation  x x x     Functional Mobility Training  x x      Ambulation/Gait Training  x       Stair Training  x       Wheelchair Propulsion/Positioning  x       Functional Tourist information centre manager Reintegration  x       Dysphagia/Aspiration Landscape architect Facilitation    x     Bladder Management x        Bowel Management x        Disease Management/Prevention x        Pain Management x x x      Medication Management x        Skin Care/Wound Management x  x      Splinting/Orthotics  x x      Discharge Planning x x x x     Psychosocial Support x  x                             Team Discharge Planning: Destination: PT-  ,OT-   , SLP-Home Projected Follow-up: PT- , OT-   ,  SLP- HH Projected Equipment Needs: PT- , OT-  , SLP-None recommended by SLP Patient/family involved in discharge planning: PT-  ,  OT- , SLP-Family member/caregiver  MD ELOS: 5-7 days Medical Rehab Prognosis:  Excellent Assessment: The patient has been admitted for CIR therapies. The team will be addressing, functional mobility, strength, stamina, balance, safety, adaptive techniques/equipment, self-care, bowel and bladder mgt, patient and caregiver education, behavior, cognition, pain mgt, ortho precautions. Goals have been set at supervision for mobility and self-care and min assist for cognition.      See Team Conference Notes for weekly updates to the plan of care

## 2012-08-07 NOTE — Progress Notes (Signed)
Orthopedic Tech Progress Note Patient Details:  Troy Bishop 1990/10/25 [id number] Short leg cast removed and reapplied. Patient complained that cast was too tight. Cast reapplied loosely with assistance of nurse tech for holding support of leg. OTA present to work with patient after cast application.  Casting Cast Material: Fiberglass Cast Intervention: Re-application     Asia R Thompson 08/07/2012, 11:16 AM

## 2012-08-08 ENCOUNTER — Inpatient Hospital Stay (HOSPITAL_COMMUNITY): Payer: Medicaid Other

## 2012-08-08 ENCOUNTER — Inpatient Hospital Stay (HOSPITAL_COMMUNITY): Payer: Medicaid Other | Admitting: Speech Pathology

## 2012-08-08 ENCOUNTER — Inpatient Hospital Stay (HOSPITAL_COMMUNITY): Payer: Medicaid Other | Admitting: Physical Therapy

## 2012-08-08 DIAGNOSIS — S069XAA Unspecified intracranial injury with loss of consciousness status unknown, initial encounter: Secondary | ICD-10-CM

## 2012-08-08 DIAGNOSIS — S82409A Unspecified fracture of shaft of unspecified fibula, initial encounter for closed fracture: Secondary | ICD-10-CM

## 2012-08-08 DIAGNOSIS — S82839A Other fracture of upper and lower end of unspecified fibula, initial encounter for closed fracture: Secondary | ICD-10-CM

## 2012-08-08 DIAGNOSIS — S82109A Unspecified fracture of upper end of unspecified tibia, initial encounter for closed fracture: Secondary | ICD-10-CM

## 2012-08-08 DIAGNOSIS — IMO0002 Reserved for concepts with insufficient information to code with codable children: Secondary | ICD-10-CM

## 2012-08-08 DIAGNOSIS — S069X9A Unspecified intracranial injury with loss of consciousness of unspecified duration, initial encounter: Secondary | ICD-10-CM

## 2012-08-08 NOTE — Progress Notes (Signed)
Subjective/Complaints: Restless. Still complains of cast being too snug  A 12 point review of systems has been performed and if not noted above is otherwise negative.   Objective: Vital Signs: Blood pressure 102/67, pulse 75, temperature 98.5 F (36.9 C), temperature source Oral, resp. rate 20, weight 56.564 kg (124 lb 11.2 oz), SpO2 99.00%. No results found.  Basename 08/07/12 0644  WBC 9.2  HGB 10.4*  HCT 30.6*  PLT 241    Basename 08/07/12 0644  NA 137  K 3.6  CL 99  GLUCOSE 107*  BUN 15  CREATININE 0.85  CALCIUM 9.3   CBG (last 3)  No results found for this basename: GLUCAP:3 in the last 72 hours  Wt Readings from Last 3 Encounters:  08/06/12 56.564 kg (124 lb 11.2 oz)    Physical Exam:  Constitutional: He appears well-developed and well-nourished.  HENT:  Head: Normocephalic.  Right Ear: External ear normal.  Left Ear: External ear normal.  Dry scab over left eyebrow  Eyes: Pupils are equal, round, and reactive to light.  Neck: Normal range of motion.  Cardiovascular: Normal rate and regular rhythm. Exam reveals no gallop and no friction rub.  No murmur heard.  Pulmonary/Chest: Effort normal. No respiratory distress. He has no wheezes.  Abdominal: Soft. Bowel sounds are normal. He exhibits no distension. There is no tenderness.  Musculoskeletal:  LLE in SLC. Bilateral toes warm. Cast is snug but appropriate. Neurological: He is alert.  Less Confused. Oriented to self only. reduced insight and no awareness of deficits.  Tended to repeat self. Able to follow simple commands without difficulty. Impulsive with quick movements. Decreased STM. No sensory deficits. Behavior non-agitated, is distractible.  Skin: Skin is warm and dry   Assessment/Plan: 1. Functional deficits secondary to TBI/polytrauma  which require 3+ hours per day of interdisciplinary therapy in a comprehensive inpatient rehab setting. Physiatrist is providing close team supervision and 24 hour  management of active medical problems listed below. Physiatrist and rehab team continue to assess barriers to discharge/monitor patient progress toward functional and medical goals. FIM: FIM - Bathing Bathing Steps Patient Completed: Chest;Right Arm;Left Arm;Abdomen;Front perineal area;Buttocks;Right upper leg;Left upper leg;Right lower leg (including foot) Bathing: 5: Supervision: Safety issues/verbal cues  FIM - Upper Body Dressing/Undressing Upper body dressing/undressing steps patient completed: Thread/unthread right sleeve of pullover shirt/dresss;Thread/unthread left sleeve of pullover shirt/dress;Put head through opening of pull over shirt/dress;Pull shirt over trunk Upper body dressing/undressing: 7: Complete Independence: No helper FIM - Lower Body Dressing/Undressing Lower body dressing/undressing steps patient completed: Thread/unthread right pants leg;Thread/unthread left pants leg;Pull pants up/down;Don/Doff right sock Lower body dressing/undressing: 5: Supervision: Safety issues/verbal cues  FIM - Toileting Toileting steps completed by patient: Performs perineal hygiene;Adjust clothing after toileting Toileting: 3: Mod-Patient completed 2 of 3 steps  FIM - Toilet Transfers Toilet Transfers: 4-To toilet/BSC: Min A (steadying Pt. > 75%);4-From toilet/BSC: Min A (steadying Pt. > 75%)  FIM - Bed/Chair Transfer Bed/Chair Transfer: 4: Chair or W/C > Bed: Min A (steadying Pt. > 75%);4: Bed > Chair or W/C: Min A (steadying Pt. > 75%)  FIM - Locomotion: Wheelchair Distance: 50 Locomotion: Wheelchair: 2: Travels 50 - 149 ft with minimal assistance (Pt.>75%) FIM - Locomotion: Ambulation Ambulation/Gait Assistance: 4: Min assist Locomotion: Ambulation: 2: Travels 50 - 149 ft with minimal assistance (Pt.>75%)  Comprehension Comprehension Mode: Auditory (with [place] phone interpreter) Comprehension: 4-Understands basic 75 - 89% of the time/requires cueing 10 - 24% of the  time  Expression Expression Mode: Verbal Expression  Assistive Devices:  (speaks limited [place], [place] interpreter used ) Expression: 5-Expresses basic 90% of the time/requires cueing < 10% of the time.  Social Interaction Social Interaction: 3-Interacts appropriately 50 - 74% of the time - May be physically or verbally inappropriate.  Problem Solving Problem Solving: 2-Solves basic 25 - 49% of the time - needs direction more than half the time to initiate, plan or complete simple activities  Memory Memory: 2-Recognizes or recalls 25 - 49% of the time/requires cueing 51 - 75% of the time  Medical Problem List and Plan:  1. DVT Prophylaxis/Anticoagulation: Pharmaceutical: Lovenox  2. Pain Management: Will schedule oxycodone prior to therapies to help with pain management  3. Mood: Currently confused with poor awareness/insight. Will have LCSW follow along for evaluation when appropriate. Neuropsych eval as well.  4. Neuropsych: This patient is not capable of making decisions on his/her own behalf.  5. Constipation: started on miralax   6. ABLA: labs today. Fe supplement 7. Hypokalemia: Likely dilutional. Will recheck today  8. Cast: replaced. A little snug but fitting appropriately  LOS (Days) 2 A FACE TO FACE EVALUATION WAS PERFORMED  Christie Viscomi T 08/08/2012 9:02 AM

## 2012-08-08 NOTE — Progress Notes (Signed)
3:30 am Patient was complaining about pain and tightness in Left ankle up to the knee (shoe cast in place).Circulation was assessed and found to be normal.  Patient given oxycodone 10 mg for pain 10 out of 10 per patient. Later (45 minutes) patient still complained of pain 10 out 10. Called ortho tech to assess the cast  She stated that she didn't think it was to tight and she was able to get 3 fingers between the cast. PA Delle Reining notified and she stated, to notify Dr Riley Kill in the am to get him to take a look at it. Rapid response called to get a second opinion, she stated that it was okay and did not look to tight. Color of foot was noted to be within normal limits as well as capillary refill, warmth patient complained of no numbness or tingling.

## 2012-08-08 NOTE — Progress Notes (Signed)
Physical Therapy Note  Patient Details  Name: Troy Bishop MRN: [id number] Date of Birth: February 22, 1991 Today's Date: 08/08/2012  Time: 1100-1127 27 minutes  Pt c/o L LE pain, RN made aware.  Treatment focused on increasing pt activity tolerance and endurance as well as safety awareness.  Pt educated multiple times on need to use RW when standing due to wt bearing precautions, very little carry over.  Pt also educated on safety with RW mobility, little carry over throughout session.  Gait with RW 100'x2, 150' with close supervision.  Pt with improved adherence to wt bearing precautions, no LOB.  Nu step for  R LE/UE strength and endurance x 7 minutes level 3.  Pt limited by impulsivity and poor safety awareness throughout session.  Individual therapy   Daisia Slomski 08/08/2012, 11:27 AM

## 2012-08-08 NOTE — Progress Notes (Signed)
Speech Language Pathology Daily Session Note  Patient Details  Name: Troy Bishop MRN: [id number] Date of Birth: 05-20-1991  Today's Date: 08/08/2012 Time: 0900-0930 Time Calculation (min): 30 min  Short Term Goals: Week 1: SLP Short Term Goal 1 (Week 1): Patient will demonstrate intellectual awareness to deficits and safety strategies(not to get up without assistance) in simulated and controlled situations, with moderate verbal cues. SLP Short Term Goal 2 (Week 1): Patient will maintain topic during conversation and question-response tasks with SLP with minimal cues to redirect to topic. SLP Short Term Goal 3 (Week 1): Patient will utilize visual aids to orient to time/place/situation, with minimal cues to initiate use.  Skilled Therapeutic Interventions: Skilled treatment session focused on addressing orientation and awareness. SLP facilitated session by providing mod assist verbal cues while patient created an external aid with written with biographical information, information regarding situation and current deficits.  Patient exhibits improved orientation as compared to yesterday's session and wife in agreement. Overall, he continues to require mod cues to recall and differentiate [place] for [place] with move 1.5 years ago, despite accurately writing current address.  Education completed with patient and wife regarding cognition and dysphagia.  Wife reports good toleration of diet upgrade.     FIM:  Comprehension Comprehension Mode: Auditory Comprehension: 4-Understands basic 75 - 89% of the time/requires cueing 10 - 24% of the time Expression Expression Mode: Verbal Expression: 5-Expresses basic needs/ideas: With extra time/assistive device Social Interaction Social Interaction: 5-Interacts appropriately 90% of the time - Needs monitoring or encouragement for participation or interaction. Problem Solving Problem Solving: 3-Solves basic 50 - 74% of the time/requires cueing 25 - 49% of the  time Memory Memory: 3-Recognizes or recalls 50 - 74% of the time/requires cueing 25 - 49% of the time  Pain Pain Assessment Pain Assessment: No/denies pain  Therapy/Group: Individual Therapy  [name], CCC-SLP [phone]  [name] 08/08/2012, 9:38 AM

## 2012-08-08 NOTE — Progress Notes (Signed)
Occupational Therapy Session Note  Patient Details  Name: Troy Bishop MRN: [id number] Date of Birth: 1990/09/21  Today's Date: 08/08/2012 Time: 0800-0853 Time Calculation (min): 53 min  Short Term Goals: Week 1:  OT Short Term Goal 1 (Week 1): Pt will be oriented x3 wtih external aids with min A OT Short Term Goal 2 (Week 1): Pt will maintain weight bear precautions with min A wtih functional mobility  Skilled Therapeutic Interventions/Progress Updates:    Pt resting in bed with wife at bedside.  Pt attempted to stand up while bearing weight through LLE.  Pt's wife instructed pt to sit back down.  Pt engaged in bathing at tub/shower level with supervision and max verbal cues for safety awareness.  Pt continued to attempt to stand up in shower while bearing weight through LLE.  Pt completed dressing with sit<>stand from tub bench.  Pt exhibits impulsive behavior and requires close supervision at all times.  Pt maintains weight bearing precautions when ambulating with RW but continues to attempt to bear weight through LLE with sit<>stand and transfers.  Therapy Documentation Precautions:  Precautions Precautions: Fall Restrictions Weight Bearing Restrictions: Yes LLE Weight Bearing: Touchdown weight bearing   Pain: Pain Assessment Pain Assessment: 0-10 Pain Score:   5 Pain Type: Acute pain Pain Location: Shoulder Pain Orientation: Right;Left;Posterior Pain Descriptors: Aching;Sore Pain Onset: On-going Pain Intervention(s): RN made aware; premedicated prior to therapy (per pt)  See FIM for current functional status  Therapy/Group: Individual Therapy  Rich Brave 08/08/2012, 8:53 AM

## 2012-08-09 ENCOUNTER — Inpatient Hospital Stay (HOSPITAL_COMMUNITY): Payer: Medicaid Other | Admitting: Speech Pathology

## 2012-08-09 ENCOUNTER — Encounter (HOSPITAL_COMMUNITY): Payer: Medicaid Other | Admitting: Occupational Therapy

## 2012-08-09 ENCOUNTER — Inpatient Hospital Stay (HOSPITAL_COMMUNITY): Payer: Medicaid Other | Admitting: Physical Therapy

## 2012-08-09 MED ORDER — POLYETHYLENE GLYCOL 3350 17 G PO PACK
17.0000 g | PACK | Freq: Three times a day (TID) | ORAL | Status: DC
  Administered 2012-08-09 – 2012-08-10 (×3): 17 g via ORAL
  Filled 2012-08-09 (×6): qty 1

## 2012-08-09 NOTE — Progress Notes (Signed)
Physical Therapy Session Note  Patient Details  Name: Troy Bishop MRN: [id number] Date of Birth: [birth year]  Today's Date: 08/09/2012 Time: 1000-1055 Time Calculation (min): 55 min  Short Term Goals: Week 1:     Skilled Therapeutic Interventions/Progress Updates:  Patient performed supine/sit transfer with supervision; sit/stand to RW from EOB with supervision for safety issues.  Patient performed stand pivot transfer to [facility] with RW with supervision.  Patient propelled WC for increased strength and endurance in controlled environment 100' with min A cuing for improved safety due to patient impulsivity.  Instruction to patient on stand pivot transfers WC/mat table.  Patient required verbal cuing and visual demonstration of safe technique for transfer (patient inclined to place hands on walker during transfer and would forget to set brakes on WC).  Performed 6 slow stand pivot transfers to reinforce technique.  In supine patient performed left LE strengthening: quad sets, heel slides, hip a/a all 20x each.  Additional UE/right LE strengthening on NuStep at level 3 for 8 minutes at mild level of exertion.  Ambulation with RW and close supervision 100' x2 bouts with seated break between bouts.  (Placed sock on patient's cast under boot to try to keep boot on better as it tended to slip off during ambulation.)  Patient required constant cuing to maintain WB limitation on left LE.  Patient c/o right shoulder pain after ambulation.  Session concluded with patient propelling WC back to room 100' with close supervision and improved control.  Stand pivot transfer WC to bed, bed alarm on.     Therapy Documentation Precautions:  Precautions Precautions: Fall Restrictions Weight Bearing Restrictions: Yes LLE Weight Bearing: Touchdown weight bearing   Pain: Pain Assessment Pain Assessment: 0-10 Pain Score:   5 Pain Type: Acute pain Pain Location: Shoulder Pain Orientation: Right Pain Descriptors:  Aching Pain Onset: With Activity (worse during ambulation) Pain Intervention(s): RN made aware  See FIM for current functional status  Therapy/Group: Individual Therapy  Rexene Agent 08/09/2012, 10:57 AM

## 2012-08-09 NOTE — Progress Notes (Signed)
Occupational Therapy Session Note  Patient Details  Name: Mouhamed Glassco MRN: [id number] Date of Birth: [birth year]  Today's Date: 08/09/2012 Time: 4098-1191 Time Calculation (min): 45 min  Short Term Goals: Week 1:  OT Short Term Goal 1 (Week 1): Pt will be oriented x3 wtih external aids with min A OT Short Term Goal 2 (Week 1): Pt will maintain weight bear precautions with min A wtih functional mobility  Skilled Therapeutic Interventions/Progress Updates:    1:1 self care retraining at shower level down in ADL apartment focusing on safe functional ambulation with RW, transfer using tub bench into shower, sit to stand maintaining weight bearing precautions, orientation (x4 with min questioning cues), recall of recent events with environmental cues and choices.   Therapy Documentation Precautions:  Precautions Precautions: Fall Restrictions Weight Bearing Restrictions: Yes LLE Weight Bearing: Touchdown weight bearing  Vital Signs: Therapy Vitals Temp: 98.5 F (36.9 C) Temp src: Oral Pulse Rate: 82  Resp: 18  BP: 115/63 mmHg Patient Position, if appropriate: Lying Oxygen Therapy SpO2: 98 % O2 Device: None (Room air) Pain: Pain Assessment Pain Score:   9  See FIM for current functional status  Therapy/Group: Individual Therapy  Roney Mans [facility] 08/09/2012, 8:38 AM

## 2012-08-09 NOTE — Progress Notes (Signed)
Speech Language Pathology Daily Session Notes  Patient Details  Name: Troy Bishop MRN: [id number] Date of Birth: 03-10-1991  Today's Date: 08/09/2012  Session 1 Time: 6962-9528 Time Calculation (min): 40 min  Session 2 Time: 4132-4401 Time Calculation: 40 min   Short Term Goals: Week 1: SLP Short Term Goal 1 (Week 1): Patient will demonstrate intellectual awareness to deficits and safety strategies(not to get up without assistance) in simulated and controlled situations, with moderate verbal cues. SLP Short Term Goal 2 (Week 1): Patient will maintain topic during conversation and question-response tasks with SLP with minimal cues to redirect to topic. SLP Short Term Goal 3 (Week 1): Patient will utilize visual aids to orient to time/place/situation, with minimal cues to initiate use.  Skilled Therapeutic Interventions:  Session 1: Treatment focus on cognitive goals. Pt was oriented to X 4 with supervision verbal cues. Pt demonstrated functional problem solving and organization for making a calendar with Mod I. Pt required Mod verbal cues for emergent awareness into deficits and the impact it will have on his function at discharge.    Session 2: Treatment focus on cognitive goals. Pt participated in functional problem solving task with basic money management. Pt completed task with Mod I. Pt requesting to leave the hospital today and was educated on current discharge plan. Pt continues to demonstrate decreased emergent awareness of deficits and requesting to remove his cast. Pt requires Mod verbal and question cues for recall and safety awareness.   FIM:  Expression Expression Mode: Verbal Expression: 5-Expresses basic needs/ideas: With extra time/assistive device Social Interaction Social Interaction: 5-Interacts appropriately 90% of the time - Needs monitoring or encouragement for participation or interaction. Problem Solving Problem Solving: 3-Solves basic 50 - 74% of the time/requires  cueing 25 - 49% of the time Memory Memory: 3-Recognizes or recalls 50 - 74% of the time/requires cueing 25 - 49% of the time  Pain Pain Assessment Pain Assessment: 0-10 Pain Score:   5 Pain Type: Acute pain Pain Location: Shoulder Pain Orientation: Right Pain Descriptors: Aching Pain Onset: With Activity (worse during ambulation) Pain Intervention(s): RN made aware  Therapy/Group: Individual Therapy  Brownie Nehme 08/09/2012, 1:55 PM

## 2012-08-09 NOTE — Progress Notes (Signed)
Subjective/Complaints: Restless. Still complains of cast and neck  A 12 point review of systems has been performed and if not noted above is otherwise negative.   Objective: Vital Signs: Blood pressure 115/63, pulse 82, temperature 98.5 F (36.9 C), temperature source Oral, resp. rate 18, weight 56.564 kg (124 lb 11.2 oz), SpO2 98.00%. No results found.  Basename 08/07/12 0644  WBC 9.2  HGB 10.4*  HCT 30.6*  PLT 241    Basename 08/07/12 0644  NA 137  K 3.6  CL 99  GLUCOSE 107*  BUN 15  CREATININE 0.85  CALCIUM 9.3   CBG (last 3)  No results found for this basename: GLUCAP:3 in the last 72 hours  Wt Readings from Last 3 Encounters:  08/06/12 56.564 kg (124 lb 11.2 oz)    Physical Exam:  Constitutional: He appears well-developed and well-nourished.  HENT:  Head: Normocephalic.  Right Ear: External ear normal.  Left Ear: External ear normal.  Dry scab over left eyebrow  Eyes: Pupils are equal, round, and reactive to light.  Neck: Normal range of motion.  Cardiovascular: Normal rate and regular rhythm. Exam reveals no gallop and no friction rub.  No murmur heard.  Pulmonary/Chest: Effort normal. No respiratory distress. He has no wheezes.  Abdominal: Soft. Bowel sounds are normal. He exhibits no distension. There is no tenderness.  Musculoskeletal:  LLE in SLC. Bilateral toes warm. Cast is snug but appropriate. Neurological: He is alert.  Less . Oriented to self sometimes place.  reduced insight and no awareness of deficits.  Tended to repeat self. Able to follow simple commands without difficulty. Impulsive with quick movements. Decreased STM. No sensory deficits. Behavior non-agitated, is distractible, impulsive Skin: Skin is warm and dry   Assessment/Plan: 1. Functional deficits secondary to TBI/polytrauma  which require 3+ hours per day of interdisciplinary therapy in a comprehensive inpatient rehab setting. Physiatrist is providing close team supervision and  24 hour management of active medical problems listed below. Physiatrist and rehab team continue to assess barriers to discharge/monitor patient progress toward functional and medical goals.  Probably can go home soon.  FIM: FIM - Bathing Bathing Steps Patient Completed: Chest;Right Arm;Left Arm;Abdomen;Front perineal area;Buttocks;Right upper leg;Left upper leg;Right lower leg (including foot) Bathing: 5: Supervision: Safety issues/verbal cues  FIM - Upper Body Dressing/Undressing Upper body dressing/undressing steps patient completed: Thread/unthread right sleeve of pullover shirt/dresss;Thread/unthread left sleeve of pullover shirt/dress;Put head through opening of pull over shirt/dress;Pull shirt over trunk Upper body dressing/undressing: 7: Complete Independence: No helper FIM - Lower Body Dressing/Undressing Lower body dressing/undressing steps patient completed: Thread/unthread right pants leg;Thread/unthread left pants leg;Pull pants up/down;Don/Doff right sock Lower body dressing/undressing: 5: Supervision: Safety issues/verbal cues  FIM - Toileting Toileting steps completed by patient: Performs perineal hygiene;Adjust clothing after toileting Toileting: 3: Mod-Patient completed 2 of 3 steps  FIM - Toilet Transfers Toilet Transfers: 4-To toilet/BSC: Min A (steadying Pt. > 75%);4-From toilet/BSC: Min A (steadying Pt. > 75%)  FIM - Bed/Chair Transfer Bed/Chair Transfer: 5: Chair or W/C > Bed: Supervision (verbal cues/safety issues);5: Bed > Chair or W/C: Supervision (verbal cues/safety issues)  FIM - Locomotion: Wheelchair Distance: 50 Locomotion: Wheelchair: 2: Travels 50 - 149 ft with minimal assistance (Pt.>75%) FIM - Locomotion: Ambulation Ambulation/Gait Assistance: 4: Min assist Locomotion: Ambulation: 5: Travels 150 ft or more with supervision/safety issues  Comprehension Comprehension Mode: Auditory Comprehension: 4-Understands basic 75 - 89% of the time/requires  cueing 10 - 24% of the time  Expression Expression Mode: Verbal Expression Assistive  Devices:  (speaks limited [place], [place] interpreter used ) Expression: 5-Expresses basic 90% of the time/requires cueing < 10% of the time.  Social Interaction Social Interaction: 5-Interacts appropriately 90% of the time - Needs monitoring or encouragement for participation or interaction.  Problem Solving Problem Solving: 3-Solves basic 50 - 74% of the time/requires cueing 25 - 49% of the time  Memory Memory: 3-Recognizes or recalls 50 - 74% of the time/requires cueing 25 - 49% of the time  Medical Problem List and Plan:  1. DVT Prophylaxis/Anticoagulation: Pharmaceutical: Lovenox  2. Pain Management: Will schedule oxycodone prior to therapies to help with pain management.  -some of pain complaints are out of perseveration also  -robaxin and heat for myofascial neck pain 3. Mood: Currently confused with poor awareness/insight. Will have LCSW follow along for evaluation when appropriate. Neuropsych eval as well.  4. Neuropsych: This patient is not capable of making decisions on his/her own behalf.  5. Constipation: started on miralax   6. ABLA: labs today. Fe supplement 7. Hypokalemia: Likely dilutional. Will recheck today  8. Cast: replaced. A little snug but fitting appropriately  LOS (Days) 3 A FACE TO FACE EVALUATION WAS PERFORMED  Nikkie Liming T 08/09/2012 8:10 AM

## 2012-08-09 NOTE — Plan of Care (Signed)
Problem: RH KNOWLEDGE DEFICIT Goal: RH STG INCREASE KNOWLEDGE OF DYSPHAGIA/FLUID INTAKE Increase knowledge of dysphagia 3 diet with min assistance  Outcome: Completed/Met Date Met:  08/09/12 Pt now on a regular thin liquids diet

## 2012-08-09 NOTE — Progress Notes (Signed)
Pt Last BM 12-24 per report and pt family. Notified Marissa Nestle, PA with only actions/orders is to continue scheduled miralaxx and continue to monitor.

## 2012-08-10 ENCOUNTER — Ambulatory Visit (HOSPITAL_COMMUNITY): Payer: Medicaid Other | Admitting: Occupational Therapy

## 2012-08-10 ENCOUNTER — Ambulatory Visit (HOSPITAL_COMMUNITY): Payer: Medicaid Other | Admitting: Physical Therapy

## 2012-08-10 DIAGNOSIS — S82839A Other fracture of upper and lower end of unspecified fibula, initial encounter for closed fracture: Secondary | ICD-10-CM

## 2012-08-10 DIAGNOSIS — IMO0002 Reserved for concepts with insufficient information to code with codable children: Secondary | ICD-10-CM

## 2012-08-10 DIAGNOSIS — S82109A Unspecified fracture of upper end of unspecified tibia, initial encounter for closed fracture: Secondary | ICD-10-CM

## 2012-08-10 DIAGNOSIS — S069X9A Unspecified intracranial injury with loss of consciousness of unspecified duration, initial encounter: Secondary | ICD-10-CM

## 2012-08-10 DIAGNOSIS — S069XAA Unspecified intracranial injury with loss of consciousness status unknown, initial encounter: Secondary | ICD-10-CM

## 2012-08-10 DIAGNOSIS — S82409A Unspecified fracture of shaft of unspecified fibula, initial encounter for closed fracture: Secondary | ICD-10-CM

## 2012-08-10 MED ORDER — TRAZODONE HCL 50 MG PO TABS: 50.0000 mg | ORAL_TABLET | Freq: Every day | ORAL | Status: DC

## 2012-08-10 MED ORDER — DIPHENHYDRAMINE HCL 25 MG PO CAPS: 25.0000 mg | ORAL_CAPSULE | Freq: Four times a day (QID) | ORAL | Status: DC | PRN

## 2012-08-10 MED ORDER — POLYSACCHARIDE IRON COMPLEX 150 MG PO CAPS: 150.0000 mg | ORAL_CAPSULE | Freq: Two times a day (BID) | ORAL | Status: DC

## 2012-08-10 MED ORDER — ACETAMINOPHEN 325 MG PO TABS: 325.0000 mg | ORAL_TABLET | ORAL | Status: DC | PRN

## 2012-08-10 MED ORDER — POLYETHYLENE GLYCOL 3350 17 G PO PACK: 17.0000 g | PACK | Freq: Three times a day (TID) | ORAL | Status: DC

## 2012-08-10 MED ORDER — MAGNESIUM CITRATE PO SOLN
1.0000 | Freq: Once | ORAL | Status: AC
  Administered 2012-08-10: 1 via ORAL
  Filled 2012-08-10: qty 296

## 2012-08-10 MED ORDER — METHOCARBAMOL 500 MG PO TABS: 500.0000 mg | ORAL_TABLET | Freq: Three times a day (TID) | ORAL | Status: DC

## 2012-08-10 MED ORDER — OXYCODONE HCL 10 MG PO TABS: 10.0000 mg | ORAL_TABLET | Freq: Four times a day (QID) | ORAL | Status: DC | PRN

## 2012-08-10 NOTE — Progress Notes (Signed)
Physical Therapy Discharge Summary  Patient Details  Name: Troy Bishop MRN: [id number] Date of Birth: 1991/08/03  Today's Date: 08/10/2012  Patient has met 7 of 7 long term goals due to improved activity tolerance, improved balance, ability to compensate for deficits and improved attention.  Patient to discharge at an ambulatory level Supervision.   Patient's care partner is independent to provide the necessary cognitive assistance at discharge.  Reasons goals not met: n/a  Recommendation:  Patient will benefit from ongoing skilled PT services in home health setting to continue to advance safe functional mobility, address ongoing impairments in awareness, attention, balance, mobility, activity tolerance, and minimize fall risk.  Equipment: RW, w/c  Reasons for discharge: treatment goals met and discharge from hospital  Patient/family agrees with progress made and goals achieved: Yes  PT Discharge Precautions/Restrictions Precautions Precautions: Fall Restrictions Weight Bearing Restrictions: Yes LLE Weight Bearing: Touchdown weight bearing  Cognition Overall Cognitive Status: Impaired Arousal/Alertness: Awake/alert Orientation Level: Oriented X4 Attention: Selective Focused Attention: Appears intact Sustained Attention: Appears intact Selective Attention: Impaired Selective Attention Impairment: Functional complex;Verbal complex Memory: Impaired Memory Impairment: Storage deficit;Retrieval deficit;Decreased short term memory;Decreased recall of new information Decreased Short Term Memory: Verbal basic;Functional basic Awareness: Impaired Awareness Impairment: Intellectual impairment Behaviors: Impulsive Safety/Judgment: Impaired [place] [id number] Scales of Cognitive Functioning: Automatic/appropriate Sensation Sensation Light Touch: Appears Intact Stereognosis: Appears Intact Hot/Cold: Appears Intact Proprioception: Appears Intact Coordination Gross Motor Movements are  Fluid and Coordinated: Yes Fine Motor Movements are Fluid and Coordinated: Yes Motor  Motor Motor: Within Functional Limits   Trunk/Postural Assessment  Cervical Assessment Cervical Assessment: Within Functional Limits Thoracic Assessment Thoracic Assessment: Within Functional Limits Lumbar Assessment Lumbar Assessment: Within Functional Limits Postural Control Postural Control: Within Functional Limits Righting Reactions: improved   Balance Balance Balance Assessed: Yes Dynamic Sitting Balance Dynamic Sitting - Level of Assistance: 5: Stand by assistance Static Standing Balance Static Standing - Balance Support: During functional activity Static Standing - Level of Assistance: 5: Stand by assistance Dynamic Standing Balance Dynamic Standing - Balance Support: During functional activity Dynamic Standing - Level of Assistance: 5: Stand by assistance Extremity Assessment  RUE Assessment RUE Assessment: Within Functional Limits LUE Assessment LUE Assessment: Within Functional Limits RLE Assessment RLE Assessment: Within Functional Limits LLE Assessment LLE Assessment: Within Functional Limits (except ankle in cast)  See FIM for current functional status  Troy Bishop 08/10/2012, 10:53 AM

## 2012-08-10 NOTE — Progress Notes (Signed)
Social Work  Discharge Note  The overall goal for the admission was met for:   Discharge location: Yes - home with wife and family to provide 24/7 supervision  Length of Stay: Yes - 4 days  Discharge activity level: Yes - supervision  Home/community participation: Yes  Services provided included: MD, RD, PT, OT, SLP, RN, TR, Pharmacy and SW  Financial Services: Medicaid  Follow-up services arranged: Home Health: PT, OT, ST via [facility], DME: 18x18 [employer]reezy w/c, cushion, rolling walker, 3n1 commode and tub bench via [facility] and Patient/Family has no preference for HH/DME agencies  Comments (or additional information):  Patient/Family verbalized understanding of follow-up arrangements: Yes  Individual responsible for coordination of the follow-up plan: wife  Confirmed correct DME delivered: Amada Jupiter 08/10/2012    Troy Bishop

## 2012-08-10 NOTE — Progress Notes (Signed)
Occupational Therapy Discharge Summary  Patient Details  Name: Troy Bishop MRN: [id number] Date of Birth: 1991-08-07  Today's Date: 08/10/2012 Time: 0900-0930 Time Calculation (min): 30 min  Patient has met 14 of 14 long term goals due to improved activity tolerance, improved balance, ability to compensate for deficits, improved attention and simple problem solving and orientation.  Patient to discharge at overall Supervision level with RW for functional ambulation. Pt requires 24 hr supervision due to decreased memory, safety awareness, insight into deficits. Pt is a [place] Level VII.   Patient's care partner is independent to provide the necessary physical and cognitive assistance at discharge.    Reasons goals not met: n/a   Recommendation:  Patient will benefit from ongoing skilled OT services in home health setting to continue to advance functional skills in the area of BADL and Vocation.  Equipment: W/c RW and 3:1  Reasons for discharge: treatment goals met and discharge from hospital  Patient/family agrees with progress made and goals achieved: Yes  OT Discharge Precautions/Restrictions  Precautions Precautions: Fall Restrictions Weight Bearing Restrictions: Yes LLE Weight Bearing: Touchdown weight bearing Pain Pain Assessment Pain Assessment: No/denies pain ADL  see FIM Vision/Perception  Vision - History Baseline Vision: No visual deficits Patient Visual Report: No change from baseline Vision - Assessment Eye Alignment: Within Functional Limits Perception Perception: Within Functional Limits Praxis Praxis: Intact  Cognition Overall Cognitive Status: Impaired Arousal/Alertness: Awake/alert Orientation Level: Oriented X4 Attention: Selective Focused Attention: Appears intact Sustained Attention: Appears intact Selective Attention: Impaired Selective Attention Impairment: Verbal complex;Functional complex Memory: Impaired Memory Impairment: Storage  deficit;Retrieval deficit;Decreased short term memory;Decreased recall of new information Decreased Short Term Memory: Verbal basic;Functional basic Awareness: Impaired Awareness Impairment: Intellectual impairment Problem Solving: Impaired Problem Solving Impairment: Verbal basic;Functional basic Executive Function: Reasoning;Decision Making Reasoning: Impaired Reasoning Impairment: Verbal basic;Functional basic Decision Making: Impaired Decision Making Impairment: Verbal basic;Functional basic Initiating: Appears intact Behaviors: Impulsive Safety/Judgment: Impaired [place] Scales of Cognitive Functioning: Automatic/appropriate Sensation Sensation Light Touch: Appears Intact Stereognosis: Appears Intact Hot/Cold: Appears Intact Proprioception: Appears Intact Coordination Gross Motor Movements are Fluid and Coordinated: Yes Fine Motor Movements are Fluid and Coordinated: Yes Motor  Motor Motor: Within Functional Limits Mobility  Transfers Sit to Stand: 5: Supervision Stand to Sit: 5: Supervision  Trunk/Postural Assessment  Cervical Assessment Cervical Assessment: Within Functional Limits Thoracic Assessment Thoracic Assessment: Within Functional Limits Lumbar Assessment Lumbar Assessment: Within Functional Limits Postural Control Postural Control: Within Functional Limits Righting Reactions: improved   Balance Balance Balance Assessed: Yes Static Standing Balance Static Standing - Balance Support: During functional activity Static Standing - Level of Assistance: 5: Stand by assistance Dynamic Standing Balance Dynamic Standing - Balance Support: During functional activity Dynamic Standing - Level of Assistance: 5: Stand by assistance Extremity/Trunk Assessment RUE Assessment RUE Assessment: Within Functional Limits LUE Assessment LUE Assessment: Within Functional Limits  See FIM for current functional status  Roney [facility] 08/10/2012, 9:51  AM
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
## 2012-08-10 NOTE — Progress Notes (Signed)
Subjective/Complaints: Restless. Still complains of cast and neck  A 12 point review of systems has been performed and if not noted above is otherwise negative.   Objective: Vital Signs: Blood pressure 113/92, pulse 79, temperature 98.2 F (36.8 C), temperature source Oral, resp. rate 18, weight 56.564 kg (124 lb 11.2 oz), SpO2 99.00%. No results found. No results found for this basename: WBC:2,HGB:2,HCT:2,PLT:2 in the last 72 hours No results found for this basename: NA:2,K:2,CL:2,CO:2,GLUCOSE:2,BUN:2,CREATININE:2,CALCIUM:2 in the last 72 hours CBG (last 3)  No results found for this basename: GLUCAP:3 in the last 72 hours  Wt Readings from Last 3 Encounters:  08/06/12 56.564 kg (124 lb 11.2 oz)    Physical Exam:  Constitutional: He appears well-developed and well-nourished.  HENT:  Head: Normocephalic.  Right Ear: External ear normal.  Left Ear: External ear normal.  Dry scab over left eyebrow  Eyes: Pupils are equal, round, and reactive to light.  Neck: Normal range of motion.  Cardiovascular: Normal rate and regular rhythm. Exam reveals no gallop and no friction rub.  No murmur heard.  Pulmonary/Chest: Effort normal. No respiratory distress. He has no wheezes.  Abdominal: Soft. Bowel sounds are normal. He exhibits no distension. There is no tenderness.  Musculoskeletal:  LLE in SLC. Bilateral toes warm. Cast is snug but appropriate. Neurological: He is alert.  Less . Oriented to self sometimes place.  reduced insight and no awareness of deficits.  Tended to repeat self. Able to follow simple commands without difficulty. Impulsive with quick movements. Decreased STM. No sensory deficits. Behavior non-agitated, is distractible, impulsive Skin: Skin is warm and dry   Assessment/Plan: 1. Functional deficits secondary to TBI/polytrauma  which require 3+ hours per day of interdisciplinary therapy in a comprehensive inpatient rehab setting. Physiatrist is providing close team  supervision and 24 hour management of active medical problems listed below. Physiatrist and rehab team continue to assess barriers to discharge/monitor patient progress toward functional and medical goals.  Spoke to pt and wife. Provided basic TBI information. i will see him in my office in 4 weeks. Needs ortho follow up also  FIM: FIM - Bathing Bathing Steps Patient Completed: Chest;Right Arm;Left Arm;Abdomen;Front perineal area;Buttocks;Right upper leg;Left upper leg;Right lower leg (including foot) Bathing: 5: Supervision: Safety issues/verbal cues  FIM - Upper Body Dressing/Undressing Upper body dressing/undressing steps patient completed: Thread/unthread right sleeve of pullover shirt/dresss;Thread/unthread left sleeve of pullover shirt/dress;Put head through opening of pull over shirt/dress;Pull shirt over trunk Upper body dressing/undressing: 7: Complete Independence: No helper FIM - Lower Body Dressing/Undressing Lower body dressing/undressing steps patient completed: Thread/unthread right pants leg;Thread/unthread left pants leg;Pull pants up/down;Don/Doff right sock Lower body dressing/undressing: 5: Supervision: Safety issues/verbal cues  FIM - Toileting Toileting steps completed by patient: Performs perineal hygiene;Adjust clothing after toileting Toileting: 3: Mod-Patient completed 2 of 3 steps  FIM - Toilet Transfers Toilet Transfers: 4-To toilet/BSC: Min A (steadying Pt. > 75%);4-From toilet/BSC: Min A (steadying Pt. > 75%)  FIM - Bed/Chair Transfer Bed/Chair Transfer: 7: Independent: No helper;5: Chair or W/C > Bed: Supervision (verbal cues/safety issues);5: Bed > Chair or W/C: Supervision (verbal cues/safety issues)  FIM - Locomotion: Wheelchair Distance: 50 Locomotion: Wheelchair: 2: Travels 50 - 149 ft with minimal assistance (Pt.>75%) FIM - Locomotion: Ambulation Locomotion: Ambulation Assistive Devices: Designer, industrial/product Ambulation/Gait Assistance: 4: Min  assist Locomotion: Ambulation: 5: Travels 150 ft or more with supervision/safety issues  Comprehension Comprehension Mode: Auditory Comprehension: 4-Understands basic 75 - 89% of the time/requires cueing 10 - 24% of the  time  Expression Expression Mode: Verbal Expression Assistive Devices:  (speaks limited [place], [place] interpreter used ) Expression: 5-Expresses basic 90% of the time/requires cueing < 10% of the time.  Social Interaction Social Interaction: 5-Interacts appropriately 90% of the time - Needs monitoring or encouragement for participation or interaction.  Problem Solving Problem Solving: 3-Solves basic 50 - 74% of the time/requires cueing 25 - 49% of the time  Memory Memory: 3-Recognizes or recalls 50 - 74% of the time/requires cueing 25 - 49% of the time  Medical Problem List and Plan:  1. DVT Prophylaxis/Anticoagulation: Pharmaceutical: Lovenox  2. Pain Management: Will schedule oxycodone prior to therapies to help with pain management.  -some of pain complaints are out of perseveration also  -robaxin and heat for myofascial neck pain 3. Mood: Currently confused with poor awareness/insight. Will have LCSW follow along for evaluation when appropriate. Neuropsych eval as well.  4. Neuropsych: This patient is not capable of making decisions on his/her own behalf.  5. Constipation: started on miralax   6. ABLA: labs today. Fe supplement 7. Hypokalemia: Likely dilutional. 8. Cast: replaced. A little snug but fitting appropriately  LOS (Days) 4 A FACE TO FACE EVALUATION WAS PERFORMED  SWARTZ,ZACHARY T 08/10/2012 8:52 AM

## 2012-08-10 NOTE — Progress Notes (Signed)
Discharge Note: Pt is alert and oriented, VS are stable, denies CP. Discharge instructions reviewed with patient by PA-Pam, pt verbalizes understanding.  Wheelchair transportation provided, all belongings with patient.

## 2012-08-10 NOTE — Progress Notes (Signed)
Speech Language Pathology Session Note & Discharge Summary  Patient Details  Name: Troy Bishop MRN: [id number] Date of Birth: 18-Nov-1990  Today's Date: 08/10/2012 Time: 4010-2725 Time Calculation (min): 15 min  Skilled Therapeutic Intervention: Treatment focus on pt/family education in regards to current cognitive function and strategies to utilize at home to increase recall of new information, problem solving, safety awareness and insight into deficits. Pt's wife verbalized understanding of all information.   Patient has met 6 of 6 long term goals.  Patient to discharge at [facility] level.   Reasons goals not met: N/A   Clinical Impression/Discharge Summary: Patient has made functional gains and has met 6 of 6 long term goals this admission. Currently, pt is demonstrating behaviors consistent with a Rancho level VII and requires Min A for selective attention, emergent awareness, working memory, and basic problem solving. Pt/family education complete and patient to discharge home with 24 hr supervision from family. Pt would benefit from f/u home health skilled SLP intervention to maximize cognitive recovery due to decreased working memory, safety awareness, and insight into deficits and functional independence.   Care Partner:  Caregiver Able to Provide Assistance: Yes  Type of Caregiver Assistance: Physical;Cognitive  Recommendation:  Home Health SLP;24 hour supervision/assistance  Rationale for SLP Follow Up: Maximize cognitive function and independence   Equipment: N/A   Reasons for discharge: Treatment goals met;Discharged from hospital   Patient/Family Agrees with Progress Made and Goals Achieved: Yes   See FIM for current functional status  Brandon Wiechman 08/10/2012, 4:16 PM

## 2012-08-10 NOTE — Progress Notes (Signed)
Physical Therapy Note  Patient Details  Name: Troy Bishop MRN: [id number] Date of Birth: 07-22-1991 Today's Date: 08/10/2012  Time: 1000-1045 45 minutes  No c/o pain.  Treatment focused on pt/family ed with pt's wife.  Pt/wife safely performed gait with RW, w/c mobility, car transfers, curb step training and stair training with 1 handrail.  Pt/wife educated on need for supervision at all times for pt safety due to cognitive deficits and importance of maintaining wt bearing precautions to promote healing of leg.  Wife expresses understanding.  Educated pt/wife on importance of RW use with standing to maintain wt bearing precautions and use of w/c for long distances.  Pt/wife educated on set up of w/c and parts management.  Pt continues to be impulsive but wife is understanding of need for 24 hour supervision and cues for safety with all mobility.  Individual therapy   Davarius Ridener 08/10/2012, 10:47 AM

## 2012-08-13 ENCOUNTER — Encounter (HOSPITAL_COMMUNITY): Payer: Self-pay

## 2012-08-13 NOTE — Progress Notes (Signed)
Discharge summary # [id number]
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
## 2012-08-16 ENCOUNTER — Other Ambulatory Visit: Payer: Self-pay | Admitting: Neurosurgery

## 2012-08-16 DIAGNOSIS — S0990XA Unspecified injury of head, initial encounter: Secondary | ICD-10-CM

## 2012-08-23 ENCOUNTER — Ambulatory Visit
Admission: RE | Admit: 2012-08-23 | Discharge: 2012-08-23 | Disposition: A | Discharge status: Home / Self Care | Payer: Medicaid Other | Source: Ambulatory Visit | Attending: Neurosurgery | Admitting: Neurosurgery

## 2012-08-23 DIAGNOSIS — S0990XA Unspecified injury of head, initial encounter: Secondary | ICD-10-CM

## 2012-08-31 ENCOUNTER — Encounter: Payer: Self-pay | Admitting: Physical Medicine & Rehabilitation

## 2012-08-31 ENCOUNTER — Encounter: Payer: Medicaid Other | Attending: Physical Medicine & Rehabilitation | Admitting: Physical Medicine & Rehabilitation

## 2012-08-31 VITALS — BP 128/65 | HR 64 | Resp 14 | Ht 68.0 in | Wt 126.0 lb

## 2012-08-31 DIAGNOSIS — S82209B Unspecified fracture of shaft of unspecified tibia, initial encounter for open fracture type I or II: Secondary | ICD-10-CM

## 2012-08-31 DIAGNOSIS — S069XAA Unspecified intracranial injury with loss of consciousness status unknown, initial encounter: Secondary | ICD-10-CM

## 2012-08-31 DIAGNOSIS — S069X9A Unspecified intracranial injury with loss of consciousness of unspecified duration, initial encounter: Secondary | ICD-10-CM

## 2012-08-31 DIAGNOSIS — S8290XS Unspecified fracture of unspecified lower leg, sequela: Secondary | ICD-10-CM | POA: Insufficient documentation

## 2012-08-31 DIAGNOSIS — X58XXXS Exposure to other specified factors, sequela: Secondary | ICD-10-CM | POA: Insufficient documentation

## 2012-08-31 DIAGNOSIS — S069XAS Unspecified intracranial injury with loss of consciousness status unknown, sequela: Secondary | ICD-10-CM | POA: Insufficient documentation

## 2012-08-31 DIAGNOSIS — S069X9S Unspecified intracranial injury with loss of consciousness of unspecified duration, sequela: Secondary | ICD-10-CM | POA: Insufficient documentation

## 2012-08-31 DIAGNOSIS — S3282XA Multiple fractures of pelvis without disruption of pelvic ring, initial encounter for closed fracture: Secondary | ICD-10-CM

## 2012-08-31 DIAGNOSIS — S32810A Multiple fractures of pelvis with stable disruption of pelvic ring, initial encounter for closed fracture: Secondary | ICD-10-CM

## 2012-08-31 DIAGNOSIS — D62 Acute posthemorrhagic anemia: Secondary | ICD-10-CM | POA: Insufficient documentation
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
## 2012-08-31 NOTE — Patient Instructions (Signed)
TYLENOL OR IBUPROFEN FOR PAIN CONTROL.  FOLLOW DIRECTIONS ON BOTTLE

## 2012-09-07 ENCOUNTER — Inpatient Hospital Stay: Payer: Medicaid Other | Admitting: Physical Medicine & Rehabilitation

## 2012-10-21 ENCOUNTER — Emergency Department (HOSPITAL_COMMUNITY): Payer: Medicaid Other

## 2012-10-21 ENCOUNTER — Encounter (HOSPITAL_COMMUNITY): Payer: Self-pay | Admitting: Family Medicine

## 2012-10-21 ENCOUNTER — Emergency Department (HOSPITAL_COMMUNITY)
Admission: EM | Admit: 2012-10-21 | Discharge: 2012-10-21 | Disposition: A | Discharge status: Home / Self Care | Payer: Medicaid Other | Attending: Emergency Medicine | Admitting: Emergency Medicine

## 2012-10-21 DIAGNOSIS — M25512 Pain in left shoulder: Secondary | ICD-10-CM

## 2012-10-21 DIAGNOSIS — M25519 Pain in unspecified shoulder: Secondary | ICD-10-CM | POA: Insufficient documentation

## 2012-10-21 DIAGNOSIS — Z87891 Personal history of nicotine dependence: Secondary | ICD-10-CM | POA: Insufficient documentation

## 2012-10-21 MED ORDER — TRAMADOL HCL 50 MG PO TABS: 50.0000 mg | ORAL_TABLET | Freq: Four times a day (QID) | ORAL | Status: DC | PRN

## 2012-10-21 NOTE — ED Notes (Signed)
Per pt sts left sided neck pain when he turns his neck x 1 week. No other complaints.

## 2012-10-21 NOTE — ED Provider Notes (Signed)
Medical screening examination/treatment/procedure(s) were performed by non-physician practitioner and as supervising physician I was immediately available for consultation/collaboration.  Flint Melter, MD 10/21/12 [phone]
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
## 2013-05-22 ENCOUNTER — Emergency Department (INDEPENDENT_AMBULATORY_CARE_PROVIDER_SITE_OTHER): Payer: Medicaid Other

## 2013-05-22 ENCOUNTER — Emergency Department (INDEPENDENT_AMBULATORY_CARE_PROVIDER_SITE_OTHER)
Admission: EM | Admit: 2013-05-22 | Discharge: 2013-05-22 | Disposition: A | Discharge status: Home / Self Care | Payer: Self-pay | Source: Home / Self Care

## 2013-05-22 ENCOUNTER — Emergency Department (INDEPENDENT_AMBULATORY_CARE_PROVIDER_SITE_OTHER): Payer: Self-pay

## 2013-05-22 ENCOUNTER — Encounter (HOSPITAL_COMMUNITY): Payer: Self-pay | Admitting: Emergency Medicine

## 2013-05-22 DIAGNOSIS — M79609 Pain in unspecified limb: Secondary | ICD-10-CM

## 2013-05-22 DIAGNOSIS — IMO0002 Reserved for concepts with insufficient information to code with codable children: Secondary | ICD-10-CM

## 2013-05-22 DIAGNOSIS — M79605 Pain in left leg: Secondary | ICD-10-CM

## 2013-05-22 LAB — CBC
MCV: 92.1 fL (ref 78.0–100.0)
Platelets: 178 10*3/uL (ref 150–400)
RBC: 5.06 MIL/uL (ref 4.22–5.81)
WBC: 4.9 10*3/uL (ref 4.0–10.5)

## 2013-05-22 MED ORDER — SULFAMETHOXAZOLE-TRIMETHOPRIM 800-160 MG PO TABS: 1.0000 | ORAL_TABLET | Freq: Two times a day (BID) | ORAL | Status: DC
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
## 2013-05-22 NOTE — ED Notes (Signed)
Pt. requests work note be changed for him to return on 10/17.  PA notified and approved.  Note redone and given to pt.  Pt. Told if he wants FMLA papers filled out, he would need to ask Dr. Victorino Dike.
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
## 2013-05-22 NOTE — ED Notes (Signed)
I called lab for the CBC result and they said 5 more minutes.

## 2013-05-22 NOTE — ED Provider Notes (Signed)
Medical screening examination/treatment/procedure(s) were performed by non-physician practitioner and as supervising physician I was immediately available for consultation/collaboration.  Leslee Home, M.D.  Reuben Likes, MD 05/22/13 [phone]

## 2013-05-23 NOTE — ED Provider Notes (Signed)
Medical screening examination/treatment/procedure(s) were performed by non-physician practitioner and as supervising physician I was immediately available for consultation/collaboration.  Rikia Sukhu, M.D.  Teira Arcilla C Berenize Gatlin, MD 05/23/13 1655 

## 2013-10-25 ENCOUNTER — Ambulatory Visit: Payer: Self-pay

## 2014-08-06 ENCOUNTER — Encounter (HOSPITAL_COMMUNITY): Payer: Self-pay | Admitting: Emergency Medicine

## 2014-08-06 ENCOUNTER — Emergency Department (HOSPITAL_COMMUNITY): Payer: Self-pay

## 2014-08-06 ENCOUNTER — Emergency Department (HOSPITAL_COMMUNITY)
Admission: EM | Admit: 2014-08-06 | Discharge: 2014-08-07 | Disposition: A | Discharge status: Home / Self Care | Payer: Self-pay | Attending: Emergency Medicine | Admitting: Emergency Medicine

## 2014-08-06 DIAGNOSIS — Y9389 Activity, other specified: Secondary | ICD-10-CM | POA: Insufficient documentation

## 2014-08-06 DIAGNOSIS — Z23 Encounter for immunization: Secondary | ICD-10-CM | POA: Insufficient documentation

## 2014-08-06 DIAGNOSIS — Y9289 Other specified places as the place of occurrence of the external cause: Secondary | ICD-10-CM | POA: Insufficient documentation

## 2014-08-06 DIAGNOSIS — S61215A Laceration without foreign body of left ring finger without damage to nail, initial encounter: Secondary | ICD-10-CM | POA: Insufficient documentation

## 2014-08-06 DIAGNOSIS — Z87891 Personal history of nicotine dependence: Secondary | ICD-10-CM | POA: Insufficient documentation

## 2014-08-06 DIAGNOSIS — Y998 Other external cause status: Secondary | ICD-10-CM | POA: Insufficient documentation

## 2014-08-06 DIAGNOSIS — T1490XA Injury, unspecified, initial encounter: Secondary | ICD-10-CM

## 2014-08-06 DIAGNOSIS — S61213A Laceration without foreign body of left middle finger without damage to nail, initial encounter: Secondary | ICD-10-CM | POA: Insufficient documentation

## 2014-08-06 DIAGNOSIS — S61412A Laceration without foreign body of left hand, initial encounter: Secondary | ICD-10-CM | POA: Insufficient documentation

## 2014-08-06 DIAGNOSIS — W260XXA Contact with knife, initial encounter: Secondary | ICD-10-CM | POA: Insufficient documentation

## 2014-08-06 DIAGNOSIS — IMO0002 Reserved for concepts with insufficient information to code with codable children: Secondary | ICD-10-CM

## 2014-08-06 MED ORDER — LIDOCAINE HCL (PF) 1 % IJ SOLN
5.0000 mL | Freq: Once | INTRAMUSCULAR | Status: AC
  Administered 2014-08-07: 5 mL
  Filled 2014-08-06: qty 5

## 2014-08-06 MED ORDER — TETANUS-DIPHTH-ACELL PERTUSSIS 5-2.5-18.5 LF-MCG/0.5 IM SUSP
0.5000 mL | Freq: Once | INTRAMUSCULAR | Status: AC
  Administered 2014-08-07: 0.5 mL via INTRAMUSCULAR
  Filled 2014-08-06: qty 0.5

## 2014-08-06 NOTE — ED Notes (Signed)
Pt. presents with laceration at left posterior hand approx. 1/2 inch accidentally hit with a knife this evening while chopping meat , pressure dressing applied at triage with moderate bleeding .

## 2014-08-06 NOTE — ED Provider Notes (Signed)
CSN: [id number]     Arrival date & time 08/06/14  2305 History  This chart was scribed for non-physician practitioner, Dierdre ForthHannah Adely Facer, PA-C working with Tilden FossaElizabeth Rees, MD by Gwenyth Oberatherine Macek, ED scribe. This patient was seen in room E39C/E39C and the patient's care was started at 11:36 PM   Chief Complaint  Patient presents with  . Laceration   The history is provided by the patient and medical records. A language interpreter was used.   HPI Comments: Troy Speakshok Bishop is a 23 y.o. male who presents to the Emergency Department complaining of a laceration on his left hand with moderate bleeding that occurred when he was cutting meat with a knife 3 hours ago. He states weakness, drooping and decreased ROM of his left middle finger as associated symptoms. Pt denies a history of chronic medication conditions and regular medications. He also denies numbness as an associated symptom.  Patient is not taking any blood thinners. No treatments prior to arrival.  He denies fever, chills, nausea, vomiting, numbness in the extremity.  Past Medical History  Diagnosis Date  . Head injury    Past Surgical History  Procedure Laterality Date  . Tibia im nail insertion  07/31/2012    Procedure: INTRAMEDULLARY (IM) NAIL TIBIAL;  Surgeon: Toni ArthursJohn Hewitt, MD;  Location: [facility] OR;  Service: Orthopedics;  Laterality: Left;   No family history on file. History  Substance Use Topics  . Smoking status: Former Games developermoker  . Smokeless tobacco: Not on file  . Alcohol Use: Yes    Review of Systems  Constitutional: Negative for fever.  Gastrointestinal: Negative for nausea and vomiting.  Skin: Positive for wound.  Allergic/Immunologic: Negative for immunocompromised state.  Neurological: Positive for weakness. Negative for numbness.  Hematological: Does not bruise/bleed easily.  Psychiatric/Behavioral: The patient is not nervous/anxious.   All other systems reviewed and are negative.   Allergies  Review of patient's  allergies indicates no known allergies.  Home Medications   Prior to Admission medications   Medication Sig Start Date End Date Taking? Authorizing Provider  cephALEXin (KEFLEX) 500 MG capsule Take 1 capsule (500 mg total) by mouth 4 (four) times daily. 08/07/14   Keziah Avis, PA-C  HYDROcodone-acetaminophen (NORCO/VICODIN) 5-325 MG per tablet Take 1 tablet by mouth every 6 (six) hours as needed for moderate pain or severe pain. 08/07/14   Yanessa Hocevar, PA-C  sulfamethoxazole-trimethoprim (SEPTRA DS) 800-160 MG per tablet Take 1 tablet by mouth 2 (two) times daily. Patient not taking: Reported on 08/07/2014 05/22/13   Melissa R Smith, PA-C  traMADol (ULTRAM) 50 MG tablet Take 1 tablet (50 mg total) by mouth every 6 (six) hours as needed for pain. Patient not taking: Reported on 08/07/2014 10/21/12   Dorthula Matasiffany G Greene, PA-C   BP 144/92 mmHg  Pulse 69  Temp(Src) 97.9 F (36.6 C) (Oral)  Resp 20  Ht 5\' 5"  (1.651 m)  Wt 145 lb (65.772 kg)  BMI 24.13 kg/m2  SpO2 98% Physical Exam  Constitutional: He is oriented to person, place, and time. He appears well-developed and well-nourished. No distress.  HENT:  Head: Normocephalic and atraumatic.  Eyes: Conjunctivae are normal. No scleral icterus.  Neck: Normal range of motion.  Cardiovascular: Normal rate, regular rhythm, normal heart sounds and intact distal pulses.   No murmur heard. Capillary refill < 3 sec  Pulmonary/Chest: Effort normal and breath sounds normal. No respiratory distress.  Musculoskeletal: Normal range of motion. He exhibits no edema.  ROM: drooping and decreased ROM in left  long finger   Neurological: He is alert and oriented to person, place, and time.  Sensation: intact to dull and sharp in all fingers of the left hand Strength: 2/5 resisted extension in left long finger, 5/5 in all other fingers; 5/5 resisted flexion in the left long finger and all other fingers  Skin: Skin is warm and dry. He is not  diaphoretic. No erythema.  2.5 cm laceration to the posterior left hand 1 cm at the base of the left ring finger  Psychiatric: He has a normal mood and affect.  Nursing note and vitals reviewed.   ED Course  LACERATION REPAIR Date/Time: 08/07/2014 12:55 AM Performed by: Dierdre Forth Authorized by: Dierdre Forth Consent: Verbal consent obtained. Risks and benefits: risks, benefits and alternatives were discussed Consent given by: patient Patient understanding: patient states understanding of the procedure being performed Patient consent: the patient's understanding of the procedure matches consent given Procedure consent: procedure consent matches procedure scheduled Relevant documents: relevant documents present and verified Site marked: the operative site was marked Imaging studies: imaging studies available Required items: required blood products, implants, devices, and special equipment available Patient identity confirmed: verbally with patient and arm band Time out: Immediately prior to procedure a "time out" was called to verify the correct patient, procedure, equipment, support staff and site/side marked as required. Body area: upper extremity Location details: left hand Laceration length: 2.5 cm Foreign bodies: no foreign bodies Tendon involvement: complex Nerve involvement: none Vascular damage: yes Anesthesia: local infiltration Local anesthetic: lidocaine 1% without epinephrine Anesthetic total: 4 ml Patient sedated: no Preparation: Patient was prepped and draped in the usual sterile fashion. Irrigation solution: saline Irrigation method: syringe Amount of cleaning: extensive Debridement: none Degree of undermining: none Skin closure: 5-0 Prolene Number of sutures: 2 Technique: simple Approximation: close Approximation difficulty: complex Dressing: 4x4 sterile gauze, pressure dressing and splint Patient tolerance: Patient tolerated the  procedure well with no immediate complications  LACERATION REPAIR Date/Time: 08/07/2014 12:57 AM Performed by: Dierdre Forth Authorized by: Dierdre Forth Consent: Verbal consent obtained. Risks and benefits: risks, benefits and alternatives were discussed Consent given by: patient Patient understanding: patient states understanding of the procedure being performed Patient consent: the patient's understanding of the procedure matches consent given Procedure consent: procedure consent matches procedure scheduled Relevant documents: relevant documents present and verified Site marked: the operative site was marked Imaging studies: imaging studies available Required items: required blood products, implants, devices, and special equipment available Patient identity confirmed: verbally with patient and arm band Time out: Immediately prior to procedure a "time out" was called to verify the correct patient, procedure, equipment, support staff and site/side marked as required. Body area: upper extremity Location details: left ring finger Laceration length: 0.5 cm Foreign bodies: no foreign bodies Tendon involvement: none Nerve involvement: none Vascular damage: no Anesthesia: local infiltration Local anesthetic: lidocaine 1% without epinephrine Anesthetic total: 1 ml Patient sedated: no Preparation: Patient was prepped and draped in the usual sterile fashion. Irrigation solution: saline Irrigation method: syringe Amount of cleaning: extensive Debridement: none Degree of undermining: none Skin closure: 5-0 Prolene Number of sutures: 1 Technique: simple Approximation: close Approximation difficulty: simple Dressing: 4x4 sterile gauze, pressure dressing and splint Patient tolerance: Patient tolerated the procedure well with no immediate complications   (including critical care time) DIAGNOSTIC STUDIES: Oxygen Saturation is 98% on RA, normal by my interpretation.     COORDINATION OF CARE: 11:45 PM Discussed treatment plan with pt which includes x-ray of left hand.  Pt agreed to plan.  Labs Review Labs Reviewed - No data to display  Imaging Review Dg Hand Complete Left  08/06/2014   CLINICAL DATA:  Posterior hand laceration  EXAM: LEFT HAND - COMPLETE 3+ VIEW  COMPARISON:  10/02/2011  FINDINGS: Extensive punctate debris on the thickened dorsal hand soft tissues. Most of this appears superficial, although there could be a linear 3 mm foreign body at the level of the presumed laceration.  No acute fracture or malalignment.  IMPRESSION: 1. Extensive debris around the dorsal hand laceration. 2. No osseous abnormality.   Electronically Signed   By: Tiburcio PeaJonathan  Watts M.D.   On: 08/06/2014 23:57     EKG Interpretation None      MDM   Final diagnoses:  Hand laceration, left, initial encounter   Troy Bishop presents with laceration to the left hand which involves a complete rupture of the extensor tendon of the left long finger and damage to a vein.  Capillary refill less than 3 seconds in all fingers including the left long finger. Sensation intact to the left long finger, normal flexion of the left long finger but significantly weak extension of the left long finger.  Patient discussed with Dr. Melvyn Novasrtmann requests closure of the skin, splinting fingers in full extension and follow-up in the office on Monday or Tuesday.  All of this was discussed with the patient including the importance of close follow-up so that he does not lose the use of his left long finger. This was done with a translator present and patient reports understanding. No complications with the procedure and all questions answered prior to discharge.  I have personally reviewed patient's vitals, nursing note and any pertinent labs or imaging.  I performed an focused physical exam; undressed when appropriate .    It has been determined that no acute conditions requiring further emergency  intervention are present at this time. The patient/guardian have been advised of the diagnosis and plan. I reviewed any labs and imaging including any potential incidental findings. We have discussed signs and symptoms that warrant return to the ED and they are listed in the discharge instructions.    Vital signs are stable at discharge.   BP 144/92 mmHg  Pulse 69  Temp(Src) 97.9 F (36.6 C) (Oral)  Resp 20  Ht 5\' 5"  (1.651 m)  Wt 145 lb (65.772 kg)  BMI 24.13 kg/m2  SpO2 98%   I personally performed the services described in this documentation, which was scribed in my presence. The recorded information has been reviewed and is accurate.  The patient was discussed with and seen by Dr. Pecola Leisureeese who agrees with the treatment plan.   Dierdre ForthHannah Shimeka Bacot, PA-C 08/07/14 0103  Dahlia ClientHannah Romney Compean, PA-C 08/07/14 [phone]  Tilden FossaElizabeth Rees, MD 08/07/14 1452

## 2014-08-06 NOTE — ED Notes (Signed)
Pt states that he was cutting meat, when he cut his left hand. There appears to be a large clot in the wound, bleeding is controlled at this time.

## 2014-08-07 MED ORDER — HYDROCODONE-ACETAMINOPHEN 5-325 MG PO TABS: 1.0000 | ORAL_TABLET | Freq: Four times a day (QID) | ORAL | Status: DC | PRN

## 2014-08-07 MED ORDER — KETOROLAC TROMETHAMINE 10 MG PO TABS
10.0000 mg | ORAL_TABLET | Freq: Once | ORAL | Status: AC
  Administered 2014-08-07: 10 mg via ORAL
  Filled 2014-08-07: qty 1

## 2014-08-07 MED ORDER — CEPHALEXIN 500 MG PO CAPS: 500.0000 mg | ORAL_CAPSULE | Freq: Four times a day (QID) | ORAL | Status: DC

## 2014-08-07 NOTE — Discharge Instructions (Signed)
1. Medications: Vicodin for severe pain, Keflex is her antibiotic, Tylenol or ibuprofen for moderate pain, usual home medications 2. Treatment: ice for swelling, keep wound clean with warm soap and water and keep bandage dry, do not submerge in water for 24 hours 3. Follow Up: Please call TOMORROW to make an appointment with Dr[facility] Return to the emergency department for increased redness, drainage of pus from the wound, if your cast get wet or your fingers began to hurt

## 2014-08-07 NOTE — ED Notes (Signed)
Ortho paged. 

## 2014-08-13 ENCOUNTER — Encounter (HOSPITAL_BASED_OUTPATIENT_CLINIC_OR_DEPARTMENT_OTHER): Payer: Self-pay | Admitting: *Deleted

## 2014-08-13 NOTE — Progress Notes (Addendum)
NPO AFTER MN WITH EXCEPTION CLEAR LIQUIDS UNTIL 0930. ARRIVE AT 1400. NEEDS HG.  PT HAS BEEN SPOKEN WITH ABOUT TIME CHANGE. TO ARRIVE AT 1200.

## 2014-08-14 ENCOUNTER — Ambulatory Visit (HOSPITAL_BASED_OUTPATIENT_CLINIC_OR_DEPARTMENT_OTHER): Payer: BC Managed Care – PPO | Admitting: Anesthesiology

## 2014-08-14 ENCOUNTER — Encounter (HOSPITAL_BASED_OUTPATIENT_CLINIC_OR_DEPARTMENT_OTHER)
Admission: RE | Disposition: A | Discharge status: Home / Self Care | Payer: Self-pay | Source: Ambulatory Visit | Attending: Orthopedic Surgery

## 2014-08-14 ENCOUNTER — Ambulatory Visit (HOSPITAL_BASED_OUTPATIENT_CLINIC_OR_DEPARTMENT_OTHER)
Admission: RE | Admit: 2014-08-14 | Discharge: 2014-08-14 | Disposition: A | Discharge status: Home / Self Care | Payer: BC Managed Care – PPO | Source: Ambulatory Visit | Attending: Orthopedic Surgery | Admitting: Orthopedic Surgery

## 2014-08-14 ENCOUNTER — Encounter (HOSPITAL_BASED_OUTPATIENT_CLINIC_OR_DEPARTMENT_OTHER): Payer: Self-pay | Admitting: *Deleted

## 2014-08-14 DIAGNOSIS — S61412A Laceration without foreign body of left hand, initial encounter: Secondary | ICD-10-CM | POA: Insufficient documentation

## 2014-08-14 DIAGNOSIS — Z87891 Personal history of nicotine dependence: Secondary | ICD-10-CM | POA: Diagnosis not present

## 2014-08-14 DIAGNOSIS — S66922A Laceration of unspecified muscle, fascia and tendon at wrist and hand level, left hand, initial encounter: Secondary | ICD-10-CM

## 2014-08-14 DIAGNOSIS — S66303A Unspecified injury of extensor muscle, fascia and tendon of left middle finger at wrist and hand level, initial encounter: Secondary | ICD-10-CM | POA: Insufficient documentation

## 2014-08-14 HISTORY — PX: TENDON REPAIR: SHX5111

## 2014-08-14 LAB — POCT HEMOGLOBIN-HEMACUE: Hemoglobin: 15 g/dL (ref 13.0–17.0)

## 2014-08-14 SURGERY — TENDON REPAIR
Anesthesia: General | Site: Hand | Laterality: Left

## 2014-08-14 MED ORDER — FENTANYL CITRATE 0.05 MG/ML IJ SOLN
INTRAMUSCULAR | Status: AC
  Filled 2014-08-14: qty 4

## 2014-08-14 MED ORDER — CEFAZOLIN SODIUM-DEXTROSE 2-3 GM-% IV SOLR
2.0000 g | INTRAVENOUS | Status: AC
  Administered 2014-08-14: 2 g via INTRAVENOUS
  Filled 2014-08-14: qty 50

## 2014-08-14 MED ORDER — DEXAMETHASONE SODIUM PHOSPHATE 4 MG/ML IJ SOLN
INTRAMUSCULAR | Status: DC | PRN
  Administered 2014-08-14: 10 mg via INTRAVENOUS

## 2014-08-14 MED ORDER — HYDROCODONE-ACETAMINOPHEN 5-325 MG PO TABS
ORAL_TABLET | ORAL | Status: AC
  Filled 2014-08-14: qty 1

## 2014-08-14 MED ORDER — LIDOCAINE HCL (CARDIAC) 20 MG/ML IV SOLN
INTRAVENOUS | Status: DC | PRN
  Administered 2014-08-14: 100 mg via INTRAVENOUS

## 2014-08-14 MED ORDER — FENTANYL CITRATE 0.05 MG/ML IJ SOLN
25.0000 ug | INTRAMUSCULAR | Status: DC | PRN
  Filled 2014-08-14: qty 1

## 2014-08-14 MED ORDER — PROMETHAZINE HCL 25 MG/ML IJ SOLN
6.2500 mg | INTRAMUSCULAR | Status: DC | PRN
  Filled 2014-08-14: qty 1

## 2014-08-14 MED ORDER — ONDANSETRON HCL 4 MG/2ML IJ SOLN
INTRAMUSCULAR | Status: DC | PRN
  Administered 2014-08-14: 4 mg via INTRAVENOUS

## 2014-08-14 MED ORDER — SODIUM CHLORIDE 0.9 % IR SOLN
Status: DC | PRN
  Administered 2014-08-14: 500 mL

## 2014-08-14 MED ORDER — CHLORHEXIDINE GLUCONATE 4 % EX LIQD
60.0000 mL | Freq: Once | CUTANEOUS | Status: DC
  Filled 2014-08-14: qty 60

## 2014-08-14 MED ORDER — FENTANYL CITRATE 0.05 MG/ML IJ SOLN
INTRAMUSCULAR | Status: DC | PRN
  Administered 2014-08-14 (×2): 50 ug via INTRAVENOUS

## 2014-08-14 MED ORDER — MEPERIDINE HCL 25 MG/ML IJ SOLN
6.2500 mg | INTRAMUSCULAR | Status: DC | PRN
  Filled 2014-08-14: qty 1

## 2014-08-14 MED ORDER — HYDROCODONE-ACETAMINOPHEN 5-325 MG PO TABS
1.0000 | ORAL_TABLET | Freq: Once | ORAL | Status: AC
  Administered 2014-08-14: 1 via ORAL
  Filled 2014-08-14: qty 1

## 2014-08-14 MED ORDER — MIDAZOLAM HCL 5 MG/5ML IJ SOLN
INTRAMUSCULAR | Status: DC | PRN
  Administered 2014-08-14: 2 mg via INTRAVENOUS

## 2014-08-14 MED ORDER — HYDROCODONE-ACETAMINOPHEN 5-325 MG PO TABS: 1.0000 | ORAL_TABLET | Freq: Four times a day (QID) | ORAL | Status: DC | PRN

## 2014-08-14 MED ORDER — MIDAZOLAM HCL 2 MG/2ML IJ SOLN
INTRAMUSCULAR | Status: AC
  Filled 2014-08-14: qty 2

## 2014-08-14 MED ORDER — BUPIVACAINE HCL 0.25 % IJ SOLN
INTRAMUSCULAR | Status: DC | PRN
  Administered 2014-08-14: 9 mL

## 2014-08-14 MED ORDER — LACTATED RINGERS IV SOLN
INTRAVENOUS | Status: DC
  Filled 2014-08-14: qty 1000

## 2014-08-14 MED ORDER — PROPOFOL 10 MG/ML IV BOLUS
INTRAVENOUS | Status: DC | PRN
  Administered 2014-08-14: 200 mg via INTRAVENOUS

## 2014-08-14 MED ORDER — CEFAZOLIN SODIUM-DEXTROSE 2-3 GM-% IV SOLR
INTRAVENOUS | Status: AC
  Filled 2014-08-14: qty 50

## 2014-08-14 MED ORDER — LACTATED RINGERS IV SOLN
INTRAVENOUS | Status: DC
  Administered 2014-08-14: 13:00:00 via INTRAVENOUS
  Filled 2014-08-14: qty 1000

## 2014-08-14 SURGICAL SUPPLY — 39 items
BANDAGE ELASTIC 3 VELCRO ST LF (GAUZE/BANDAGES/DRESSINGS) ×2 IMPLANT
BLADE SURG 15 STRL LF DISP TIS (BLADE) ×1 IMPLANT
BLADE SURG 15 STRL SS (BLADE) ×1
BNDG CONFORM 3 STRL LF (GAUZE/BANDAGES/DRESSINGS) ×2 IMPLANT
BNDG ESMARK 4X9 LF (GAUZE/BANDAGES/DRESSINGS) ×2 IMPLANT
BNDG GAUZE ELAST 4 BULKY (GAUZE/BANDAGES/DRESSINGS) ×2 IMPLANT
CORDS BIPOLAR (ELECTRODE) ×2 IMPLANT
COVER TABLE BACK 60X90 (DRAPES) ×2 IMPLANT
CUFF TOURNIQUET SINGLE 18IN (TOURNIQUET CUFF) ×2 IMPLANT
DRAPE EXTREMITY T 121X128X90 (DRAPE) ×2 IMPLANT
DRAPE SURG 17X23 STRL (DRAPES) ×2 IMPLANT
DRSG EMULSION OIL 3X3 NADH (GAUZE/BANDAGES/DRESSINGS) ×2 IMPLANT
GLOVE BIOGEL PI IND STRL 8.5 (GLOVE) ×1 IMPLANT
GLOVE BIOGEL PI INDICATOR 8.5 (GLOVE) ×1
GLOVE SURG ORTHO 8.0 STRL STRW (GLOVE) ×2 IMPLANT
GOWN STRL REUS W/ TWL LRG LVL3 (GOWN DISPOSABLE) ×1 IMPLANT
GOWN STRL REUS W/ TWL XL LVL3 (GOWN DISPOSABLE) ×1 IMPLANT
GOWN STRL REUS W/TWL LRG LVL3 (GOWN DISPOSABLE) ×1
GOWN STRL REUS W/TWL XL LVL3 (GOWN DISPOSABLE) ×1
NEEDLE HYPO 25X1 1.5 SAFETY (NEEDLE) ×2 IMPLANT
NS IRRIG 500ML POUR BTL (IV SOLUTION) ×2 IMPLANT
PACK BASIN DAY SURGERY FS (CUSTOM PROCEDURE TRAY) ×2 IMPLANT
PAD CAST 3X4 CTTN HI CHSV (CAST SUPPLIES) ×1 IMPLANT
PADDING CAST COTTON 3X4 STRL (CAST SUPPLIES) ×1
SHEET MEDIUM DRAPE 40X70 STRL (DRAPES) ×2 IMPLANT
SPLINT FIBERGLASS 3X35 (CAST SUPPLIES) ×2 IMPLANT
SPONGE GAUZE 4X4 12PLY STER LF (GAUZE/BANDAGES/DRESSINGS) ×2 IMPLANT
STOCKINETTE 4X48 STRL (DRAPES) ×2 IMPLANT
SUCTION FRAZIER TIP 10 FR DISP (SUCTIONS) ×2 IMPLANT
SUT ETHILON 4 0 P 3 18 (SUTURE) ×2 IMPLANT
SUT FIBERWIRE #2 26 STR GREEN (SUTURE) ×2
SUT VIC AB 3-0 FS2 27 (SUTURE) ×2 IMPLANT
SUT VICRYL RAPIDE 4/0 PS 2 (SUTURE) ×4 IMPLANT
SUTURE FIBERWR #2 26 STR GREEN (SUTURE) ×1 IMPLANT
SYR BULB 3OZ (MISCELLANEOUS) ×2 IMPLANT
SYR CONTROL 10ML LL (SYRINGE) ×2 IMPLANT
TRAY DSU PREP LF (CUSTOM PROCEDURE TRAY) ×2 IMPLANT
TUBE CONNECTING 12X1/4 (SUCTIONS) ×2 IMPLANT
UNDERPAD 30X30 INCONTINENT (UNDERPADS AND DIAPERS) ×2 IMPLANT

## 2014-08-14 NOTE — Anesthesia Procedure Notes (Signed)
Procedure Name: LMA Insertion Date/Time: 08/14/2014 2:23 PM Performed by: Norva PavlovALLAWAY, Licia Harl G Pre-anesthesia Checklist: Patient identified, Emergency Drugs available, Suction available and Patient being monitored Patient Re-evaluated:Patient Re-evaluated prior to inductionOxygen Delivery Method: Circle System Utilized Preoxygenation: Pre-oxygenation with 100% oxygen Intubation Type: IV induction Ventilation: Mask ventilation without difficulty LMA: LMA inserted LMA Size: 4.0 Number of attempts: 1 Airway Equipment and Method: bite block Placement Confirmation: positive ETCO2 Tube secured with: Tape Dental Injury: Teeth and Oropharynx as per pre-operative assessment

## 2014-08-14 NOTE — Discharge Instructions (Signed)
KEEP BANDAGE CLEAN AND DRY °CALL OFFICE FOR F/U APPT [phone] in 12 days °KEEP HAND ELEVATED ABOVE HEART °OK TO APPLY ICE TO OPERATIVE AREA °CONTACT OFFICE IF ANY WORSENING PAIN OR CONCERNS. ° °      HAND SURGERY ° °  HOME CARE INSTRUCTIONS ° ° ° °The following instructions have been prepared to help you care for yourself upon your return home today. ° °Wound Care:  °Keep your hand elevated above the level of your heart. Do not allow it to dangle by your side. Keep the dressing dry and do not remove it unless your doctor advises you to do so. He will usually change it at the time of you post-op visit. Moving your fingers is advised to stimulate circulation but will depend on the site of your surgery. Of course, if you have a splint applied your doctor will advise you about movement. ° °Activity:  °Do not drive or operate machinery today. Rest today and then you may return to your normal activity and work as indicated by your physician. ° °Diet: °Drink liquids today or eat a light diet. You may resume a regular diet tomorrow. ° °General expectations: °Pain for two or three days. °Fingers may become slightly swollen.  ° °Unexpected Observations- Call your doctor if any of these occur: °Severe pain not relieved by pain medication. °Elevated temperature. °Dressing soaked with blood. °Inability to move fingers. °White or bluish color to fingers. ° ° ° ° ° °Post Anesthesia Home Care Instructions ° °Activity: °Get plenty of rest for the remainder of the day. A responsible adult should stay with you for 24 hours following the procedure.  °For the next 24 hours, DO NOT: °-Drive a car °-Operate machinery °-Drink alcoholic beverages °-Take any medication unless instructed by your physician °-Make any legal decisions or sign important papers. ° °Meals: °Start with liquid foods such as gelatin or soup. Progress to regular foods as tolerated. Avoid greasy, spicy, heavy foods. If nausea and/or vomiting occur, drink only clear  liquids until the nausea and/or vomiting subsides. Call your physician if vomiting continues. ° °Special Instructions/Symptoms: °Your throat may feel dry or sore from the anesthesia or the breathing tube placed in your throat during surgery. If this causes discomfort, gargle with warm salt water. The discomfort should disappear within 24 hours. ° °

## 2014-08-14 NOTE — Brief Op Note (Signed)
08/14/2014  1:24 PM  PATIENT:  Veryl SpeakAshok Benkert  24 y.o. male  PRE-OPERATIVE DIAGNOSIS:  LEFT HAND LACERATION WITH TENDON INVOLVEMENT  POST-OPERATIVE DIAGNOSIS:  * No post-op diagnosis entered *  PROCEDURE:  Procedure(s): LEFT HAND WOUND EXPLORATION AND TENDON REPAIR (Left)  SURGEON:  Surgeon(s) and Role:    * Sharma CovertFred W Lacorey Brusca, MD - Primary  PHYSICIAN ASSISTANT:   ASSISTANTS: none   ANESTHESIA:   general  EBL:     BLOOD ADMINISTERED:none  DRAINS: none   LOCAL MEDICATIONS USED:  MARCAINE     SPECIMEN:  No Specimen  DISPOSITION OF SPECIMEN:  N/A  COUNTS:  YES  TOURNIQUET:    DICTATION: .Other Dictation: Dictation Number [id number]  PLAN OF CARE: Discharge to home after PACU  PATIENT DISPOSITION:  PACU - hemodynamically stable.   Delay start of Pharmacological VTE agent (>24hrs) due to surgical blood loss or risk of bleeding: not applicable

## 2014-08-14 NOTE — Transfer of Care (Signed)
Immediate Anesthesia Transfer of Care Note  Patient: Troy Bishop  Procedure(s) Performed: Procedure(s) (LRB): LEFT HAND WOUND EXPLORATION AND TENDON REPAIR (Left)  Patient Location: PACU  Anesthesia Type: General  Level of Consciousness: awake, alert  and oriented  Airway & Oxygen Therapy: Patient Spontanous Breathing and Patient connected to face mask oxygen  Post-op Assessment: Report given to PACU RN and Post -op Vital signs reviewed and stable  Post vital signs: Reviewed and stable  Complications: No apparent anesthesia complications

## 2014-08-14 NOTE — Anesthesia Postprocedure Evaluation (Signed)
  Anesthesia Post-op Note  Patient: Troy Bishop  Procedure(s) Performed: Procedure(s) (LRB): LEFT HAND WOUND EXPLORATION AND TENDON REPAIR (Left)  Patient Location: PACU  Anesthesia Type: General  Level of Consciousness: awake and alert   Airway and Oxygen Therapy: Patient Spontanous Breathing  Post-op Pain: mild  Post-op Assessment: Post-op Vital signs reviewed, Patient's Cardiovascular Status Stable, Respiratory Function Stable, Patent Airway and No signs of Nausea or vomiting  Last Vitals:  Filed Vitals:   08/14/14 1216  BP: 120/75  Pulse: 76  Temp: 36.8 C  Resp: 14    Post-op Vital Signs: stable   Complications: No apparent anesthesia complications

## 2014-08-14 NOTE — Anesthesia Preprocedure Evaluation (Signed)
Anesthesia Evaluation  Patient identified by MRN, date of birth, ID band Patient awake    Reviewed: Allergy & Precautions, NPO status , Patient's Chart, lab work & pertinent test results  Airway Mallampati: II  TM Distance: >3 FB Neck ROM: Full    Dental no notable dental hx.    Pulmonary neg pulmonary ROS, former smoker,  breath sounds clear to auscultation  Pulmonary exam normal       Cardiovascular negative cardio ROS  Rhythm:Regular Rate:Normal     Neuro/Psych negative neurological ROS  negative psych ROS   GI/Hepatic negative GI ROS, Neg liver ROS,   Endo/Other  negative endocrine ROS  Renal/GU negative Renal ROS  negative genitourinary   Musculoskeletal negative musculoskeletal ROS (+)   Abdominal   Peds negative pediatric ROS (+)  Hematology negative hematology ROS (+)   Anesthesia Other Findings   Reproductive/Obstetrics negative OB ROS                             Anesthesia Physical Anesthesia Plan  ASA: I  Anesthesia Plan: General   Post-op Pain Management:    Induction: Intravenous  Airway Management Planned: LMA  Additional Equipment:   Intra-op Plan:   Post-operative Plan: Extubation in OR  Informed Consent: I have reviewed the patients History and Physical, chart, labs and discussed the procedure including the risks, benefits and alternatives for the proposed anesthesia with the patient or authorized representative who has indicated his/her understanding and acceptance.   Dental advisory given  Plan Discussed with: CRNA  Anesthesia Plan Comments:         Anesthesia Quick Evaluation
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
## 2014-08-15 ENCOUNTER — Encounter (HOSPITAL_BASED_OUTPATIENT_CLINIC_OR_DEPARTMENT_OTHER): Payer: Self-pay | Admitting: Orthopedic Surgery

## 2014-08-15 NOTE — Op Note (Signed)
NAMBirdena Jubilee:  Kulpa, Saman                   ACCOUNT NO.:  [id number]  MEDICAL RECORD NO.:  [id number]  LOCATION:                                 FACILITY:  PHYSICIAN:  Madelynn DoneFred W Italo Banton IV, MD  DATE OF BIRTH:  09-Aug-1990  DATE OF PROCEDURE:  08/14/2014 DATE OF DISCHARGE:  08/14/2014                              OPERATIVE REPORT   PREOPERATIVE DIAGNOSIS:  Left hand laceration with tendon involvement.  POSTOPERATIVE DIAGNOSIS:  Left hand laceration with tendon involvement.  ATTENDING PHYSICIAN:  Sharma CovertFred W. Klark Vanderhoef IV, MD, who was scrubbed and present for the entire procedure.  ASSISTANT SURGEON:  None.  ANESTHESIA:  General via LMA.  PROCEDURES: 1. Left long finger extensor tendon repair, dorsal hand; zone 6     extensor tendon repair. 2. Left hand interosseous intrinsic muscle repair, dorsal     interosseous. 3. Traumatic laceration, left hand, repair, greater than 2.5 cm.  SURGICAL INDICATIONS:  Mr. Isidoro DonningRai is a right-hand-dominant gentleman who was referred to me from the ER with the above-noted laceration of the dorsum of the hand.  The patient was seen and evaluated, and recommended to undergo the above procedure.  Risks, benefits, and alternatives were discussed in detail with the patient and signed informed consent was obtained.  Risks include, but not limited to bleeding; infection; damage to nearby nerves, arteries, or tendons; loss of motion of the wrist and digits; incomplete relief of symptoms and need for further surgical intervention.  DESCRIPTION OF PROCEDURE:  The patient was properly identified in the preoperative holding area and marked with a permanent marker made on the left hand to indicate the correct operative site.  The patient was then brought back to the operating room, placed supine on the anesthesia room table where general anesthesia was administered.  The patient tolerated this well.  A well-padded tourniquet was placed on the left brachium and sealed with  1000-drape.  Left upper extremity was then prepped and draped in normal sterile fashion.  Time-out was called, correct side was identified, and procedure was then begun.  Following this, a 3.5-cm laceration was extended both proximally and distally.  The wound was then opened up both proximally and distally after the tourniquet insufflated.  Dissection was carried down through the skin and subcutaneous tissue.  The patient did have a sharp laceration to the long finger extensor mechanism.  The juncture was present distally. Following this, the patient also having cut all the way down to the metacarpal.  Debridement of the skin, subcutaneous tissue and bone was then carried out of the open area, and dorsal intrinsic muscle between the index and long finger was then copiously irrigated and then repaired.  The intrinsic layer was then closed.  Following this, the extensor mechanism was repaired with a 3-0 FiberWire suture horizontal mattress and figure-of-eight sutures greater than 6 strands across the repair.  The wound was then thoroughly irrigated.  The traumatic laceration was then repaired with 4-0 Vicryl.  Adaptic dressing, sterile compressive bandage were then applied.  The patient was placed in a well- padded volar splint, immobilized the MP joint, extubated, and taken to the recovery room in good  condition.  POSTPROCEDURAL PLAN:  The patient was discharged to home and seen back in the office in approximately 2 weeks for wound check, application of short-arm cast all the way up past the MP joint for total of 4 weeks and then we begin an outpatient therapy regimen.     Madelynn Done, MD     FWO/MEDQ  D:  08/14/2014  T:  08/15/2014  Job:  [id number]

## 2014-09-10 ENCOUNTER — Encounter (HOSPITAL_COMMUNITY): Payer: Self-pay | Admitting: *Deleted

## 2014-09-10 ENCOUNTER — Emergency Department (INDEPENDENT_AMBULATORY_CARE_PROVIDER_SITE_OTHER)
Admission: EM | Admit: 2014-09-10 | Discharge: 2014-09-10 | Disposition: A | Discharge status: Home / Self Care | Payer: BC Managed Care – PPO | Source: Home / Self Care | Attending: Emergency Medicine | Admitting: Emergency Medicine

## 2014-09-10 DIAGNOSIS — T148 Other injury of unspecified body region: Secondary | ICD-10-CM

## 2014-09-10 DIAGNOSIS — T148XXA Other injury of unspecified body region, initial encounter: Secondary | ICD-10-CM

## 2014-09-10 DIAGNOSIS — M549 Dorsalgia, unspecified: Secondary | ICD-10-CM

## 2014-09-10 DIAGNOSIS — G8929 Other chronic pain: Secondary | ICD-10-CM

## 2014-09-10 MED ORDER — DICLOFENAC POTASSIUM 50 MG PO TABS
50.0000 mg | ORAL_TABLET | Freq: Three times a day (TID) | ORAL | Status: DC
Start: 2014-09-10 — End: 2016-02-10

## 2014-09-10 NOTE — ED Notes (Signed)
Pt reports 3 months of bilateral lower back pain/flank pain that is constant, but the pain increases at times
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
## 2014-09-10 NOTE — Discharge Instructions (Signed)
Back Exercises Back exercises help treat and prevent back injuries. The goal is to increase your strength in your belly (abdominal) and back muscles. These exercises can also help with flexibility. Start these exercises when told by your doctor. HOME CARE Back exercises include: Pelvic Tilt.  Lie on your back with your knees bent. Tilt your pelvis until the lower part of your back is against the floor. Hold this position 5 to 10 sec. Repeat this exercise 5 to 10 times. Knee to Chest.  Pull 1 knee up against your chest and hold for 20 to 30 seconds. Repeat this with the other knee. This may be done with the other leg straight or bent, whichever feels better. Then, pull both knees up against your chest. Sit-Ups or Curl-Ups.  Bend your knees 90 degrees. Start with tilting your pelvis, and do a partial, slow sit-up. Only lift your upper half 30 to 45 degrees off the floor. Take at least 2 to 3 seonds for each sit-up. Do not do sit-ups with your knees out straight. If partial sit-ups are difficult, simply do the above but with only tightening your belly (abdominal) muscles and holding it as told. Hip-Lift.  Lie on your back with your knees flexed 90 degrees. Push down with your feet and shoulders as you raise your hips 2 inches off the floor. Hold for 10 seconds, repeat 5 to 10 times. Back Arches.  Lie on your stomach. Prop yourself up on bent elbows. Slowly press on your hands, causing an arch in your low back. Repeat 3 to 5 times. Shoulder-Lifts.  Lie face down with arms beside your body. Keep hips and belly pressed to floor as you slowly lift your head and shoulders off the floor. Do not overdo your exercises. Be careful in the beginning. Exercises may cause you some mild back discomfort. If the pain lasts for more than 15 minutes, stop the exercises until you see your doctor. Improvement with exercise for back problems is slow.  Document Released: 08/27/2010 Document Revised: 10/17/2011  Document Reviewed: 05/26/2011 [facility]Exit[facility] Patient Information 2015 [place], [place]. This information is not intended to replace advice given to you by your health care provider. Make sure you discuss any questions you have with your health care provider.  Chronic Back Pain  When back pain lasts longer than 3 months, it is called chronic back pain.People with chronic back pain often go through certain periods that are more intense (flare-ups).  CAUSES Chronic back pain can be caused by wear and tear (degeneration) on different structures in your back. These structures include:  The bones of your spine (vertebrae) and the joints surrounding your spinal cord and nerve roots (facets).  The strong, fibrous tissues that connect your vertebrae (ligaments). Degeneration of these structures may result in pressure on your nerves. This can lead to constant pain. HOME CARE INSTRUCTIONS  Avoid bending, heavy lifting, prolonged sitting, and activities which make the problem worse.  Take brief periods of rest throughout the day to reduce your pain. Lying down or standing usually is better than sitting while you are resting.  Take over-the-counter or prescription medicines only as directed by your caregiver. SEEK IMMEDIATE MEDICAL CARE IF:   You have weakness or numbness in one of your legs or feet.  You have trouble controlling your bladder or bowels.  You have nausea, vomiting, abdominal pain, shortness of breath, or fainting. Document Released: 09/01/2004 Document Revised: 10/17/2011 Document Reviewed: 07/09/2011 [facility]Exit[facility] Patient Information 2015 [place], [place]. This information is  not intended to replace advice given to you by your health care provider. Make sure you discuss any questions you have with your health care provider.  Muscle Strain A muscle strain (pulled muscle) happens when a muscle is stretched beyond normal length. It happens when a sudden, violent force stretches your muscle too  far. Usually, a few of the fibers in your muscle are torn. Muscle strain is common in athletes. Recovery usually takes 1-2 weeks. Complete healing takes 5-6 weeks.  HOME CARE   Follow the PRICE method of treatment to help your injury get better. Do this the first 2-3 days after the injury:  Protect. Protect the muscle to keep it from getting injured again.  Rest. Limit your activity and rest the injured body part.  Ice. Put ice in a plastic bag. Place a towel between your skin and the bag. Then, apply the ice and leave it on from 15-20 minutes each hour. After the third day, switch to moist heat packs.  Compression. Use a splint or elastic bandage on the injured area for comfort. Do not put it on too tightly.  Elevate. Keep the injured body part above the level of your heart.  Only take medicine as told by your doctor.  Warm up before doing exercise to prevent future muscle strains. GET HELP IF:   You have more pain or puffiness (swelling) in the injured area.  You feel numbness, tingling, or notice a loss of strength in the injured area. MAKE SURE YOU:   Understand these instructions.  Will watch your condition.  Will get help right away if you are not doing well or get worse. Document Released: 05/03/2008 Document Revised: 05/15/2013 Document Reviewed: 02/21/2013 [facility]ExitCare Patient Information 2015 [place], [place]. This information is not intended to replace advice given to you by your health care provider. Make sure you discuss any questions you have with your health care provider.

## 2014-12-21 ENCOUNTER — Encounter (HOSPITAL_COMMUNITY): Payer: Self-pay | Admitting: Adult Health

## 2014-12-21 ENCOUNTER — Emergency Department (HOSPITAL_COMMUNITY)
Admission: EM | Admit: 2014-12-21 | Discharge: 2014-12-21 | Disposition: A | Discharge status: Home / Self Care | Payer: BLUE CROSS/BLUE SHIELD | Attending: Emergency Medicine | Admitting: Emergency Medicine

## 2014-12-21 DIAGNOSIS — K2970 Gastritis, unspecified, without bleeding: Secondary | ICD-10-CM | POA: Diagnosis not present

## 2014-12-21 DIAGNOSIS — Z87891 Personal history of nicotine dependence: Secondary | ICD-10-CM | POA: Diagnosis not present

## 2014-12-21 DIAGNOSIS — Z791 Long term (current) use of non-steroidal anti-inflammatories (NSAID): Secondary | ICD-10-CM | POA: Insufficient documentation

## 2014-12-21 DIAGNOSIS — Z87828 Personal history of other (healed) physical injury and trauma: Secondary | ICD-10-CM | POA: Insufficient documentation

## 2014-12-21 DIAGNOSIS — Z8709 Personal history of other diseases of the respiratory system: Secondary | ICD-10-CM | POA: Insufficient documentation

## 2014-12-21 DIAGNOSIS — K921 Melena: Secondary | ICD-10-CM | POA: Diagnosis not present

## 2014-12-21 DIAGNOSIS — Z8781 Personal history of (healed) traumatic fracture: Secondary | ICD-10-CM | POA: Diagnosis not present

## 2014-12-21 DIAGNOSIS — Z79899 Other long term (current) drug therapy: Secondary | ICD-10-CM | POA: Insufficient documentation

## 2014-12-21 DIAGNOSIS — R1110 Vomiting, unspecified: Secondary | ICD-10-CM | POA: Diagnosis present

## 2014-12-21 LAB — BASIC METABOLIC PANEL
Anion gap: 16 — ABNORMAL HIGH (ref 5–15)
BUN: 7 mg/dL (ref 6–20)
CHLORIDE: 101 mmol/L (ref 101–111)
CO2: 20 mmol/L — AB (ref 22–32)
Calcium: 9.2 mg/dL (ref 8.9–10.3)
Creatinine, Ser: 0.87 mg/dL (ref 0.61–1.24)
GFR calc Af Amer: >60 mL/min (ref >60)
GLUCOSE: 102 mg/dL — AB (ref 65–99)
Potassium: 3.5 mmol/L (ref 3.5–5.1)
Sodium: 137 mmol/L (ref 135–145)

## 2014-12-21 LAB — CBC
HCT: 45.3 % (ref 39.0–52.0)
Hemoglobin: 15.7 g/dL (ref 13.0–17.0)
MCH: 32.4 pg (ref 26.0–34.0)
MCHC: 34.7 g/dL (ref 30.0–36.0)
MCV: 93.4 fL (ref 78.0–100.0)
PLATELETS: 110 10*3/uL — AB (ref 150–400)
RBC: 4.85 MIL/uL (ref 4.22–5.81)
RDW: 13 % (ref 11.5–15.5)
WBC: 5.7 10*3/uL (ref 4.0–10.5)

## 2014-12-21 LAB — I-STAT TROPONIN, ED: TROPONIN I, POC: 0 ng/mL (ref 0.00–0.08)

## 2014-12-21 LAB — POC OCCULT BLOOD, ED: FECAL OCCULT BLD: NEGATIVE

## 2014-12-21 MED ORDER — SUCRALFATE 1 G PO TABS
1.0000 g | ORAL_TABLET | Freq: Once | ORAL | Status: AC
  Administered 2014-12-21: 1 g via ORAL
  Filled 2014-12-21: qty 1

## 2014-12-21 MED ORDER — SUCRALFATE 1 GM/10ML PO SUSP: 1.0000 g | Freq: Three times a day (TID) | ORAL | Status: DC

## 2014-12-21 MED ORDER — PANTOPRAZOLE SODIUM 20 MG PO TBEC: 20.0000 mg | DELAYED_RELEASE_TABLET | Freq: Two times a day (BID) | ORAL | Status: DC

## 2014-12-21 MED ORDER — GI COCKTAIL ~~LOC~~
30.0000 mL | Freq: Once | ORAL | Status: AC
  Administered 2014-12-21: 30 mL via ORAL
  Filled 2014-12-21: qty 30

## 2014-12-21 MED ORDER — ONDANSETRON 4 MG PO TBDP
8.0000 mg | ORAL_TABLET | Freq: Once | ORAL | Status: AC
  Administered 2014-12-21: 8 mg via ORAL
  Filled 2014-12-21: qty 2

## 2014-12-21 MED ORDER — PANTOPRAZOLE SODIUM 40 MG PO TBEC
40.0000 mg | DELAYED_RELEASE_TABLET | Freq: Once | ORAL | Status: AC
  Administered 2014-12-21: 40 mg via ORAL
  Filled 2014-12-21: qty 1

## 2014-12-21 NOTE — Discharge Instructions (Signed)
Limit the amount of beer you are consuming daily. Also limit spicy foods, acidic foods, coffee, and chocolate intake. Take protonic and Carafate as prescribed. Follow-up with a primary doctor. Return to the emergency department as needed if symptoms worsen.  Gastritis, Adult Gastritis is soreness and swelling (inflammation) of the lining of the stomach. Gastritis can develop as a sudden onset (acute) or long-term (chronic) condition. If gastritis is not treated, it can lead to stomach bleeding and ulcers. CAUSES  Gastritis occurs when the stomach lining is weak or damaged. Digestive juices from the stomach then inflame the weakened stomach lining. The stomach lining may be weak or damaged due to viral or bacterial infections. One common bacterial infection is the Helicobacter pylori infection. Gastritis can also result from excessive alcohol consumption, taking certain medicines, or having too much acid in the stomach.  SYMPTOMS  In some cases, there are no symptoms. When symptoms are present, they may include:  Pain or a burning sensation in the upper abdomen.  Nausea.  Vomiting.  An uncomfortable feeling of fullness after eating. DIAGNOSIS  Your caregiver may suspect you have gastritis based on your symptoms and a physical exam. To determine the cause of your gastritis, your caregiver may perform the following:  Blood or stool tests to check for the H pylori bacterium.  Gastroscopy. A thin, flexible tube (endoscope) is passed down the esophagus and into the stomach. The endoscope has a light and camera on the end. Your caregiver uses the endoscope to view the inside of the stomach.  Taking a tissue sample (biopsy) from the stomach to examine under a microscope. TREATMENT  Depending on the cause of your gastritis, medicines may be prescribed. If you have a bacterial infection, such as an H pylori infection, antibiotics may be given. If your gastritis is caused by too much acid in the  stomach, H2 blockers or antacids may be given. Your caregiver may recommend that you stop taking aspirin, ibuprofen, or other nonsteroidal anti-inflammatory drugs (NSAIDs). HOME CARE INSTRUCTIONS  Only take over-the-counter or prescription medicines as directed by your caregiver.  If you were given antibiotic medicines, take them as directed. Finish them even if you start to feel better.  Drink enough fluids to keep your urine clear or pale yellow.  Avoid foods and drinks that make your symptoms worse, such as:  Caffeine or alcoholic drinks.  Chocolate.  Peppermint or mint flavorings.  Garlic and onions.  Spicy foods.  Citrus fruits, such as oranges, lemons, or limes.  Tomato-based foods such as sauce, chili, salsa, and pizza.  Fried and fatty foods.  Eat small, frequent meals instead of large meals. SEEK IMMEDIATE MEDICAL CARE IF:   You have black or dark red stools.  You vomit blood or material that looks like coffee grounds.  You are unable to keep fluids down.  Your abdominal pain gets worse.  You have a fever.  You do not feel better after 1 week.  You have any other questions or concerns. MAKE SURE YOU:  Understand these instructions.  Will watch your condition.  Will get help right away if you are not doing well or get worse. Document Released: 07/19/2001 Document Revised: 01/24/2012 Document Reviewed: 09/07/2011 [facility] Patient Information 2015 [place], [place]. This information is not intended to replace advice given to you by your health care provider. Make sure you discuss any questions you have with your health care provider.  Food Choices for Gastroesophageal Reflux Disease When you have gastroesophageal reflux disease (GERD),  the foods you eat and your eating habits are very important. Choosing the right foods can help ease the discomfort of GERD. WHAT GENERAL GUIDELINES DO I NEED TO FOLLOW?  Choose fruits, vegetables, whole grains, low-fat dairy  products, and low-fat meat, fish, and poultry.  Limit fats such as oils, salad dressings, butter, nuts, and avocado.  Keep a food diary to identify foods that cause symptoms.  Avoid foods that cause reflux. These may be different for different people.  Eat frequent small meals instead of three large meals each day.  Eat your meals slowly, in a relaxed setting.  Limit fried foods.  Cook foods using methods other than frying.  Avoid drinking alcohol.  Avoid drinking large amounts of liquids with your meals.  Avoid bending over or lying down until 2-3 hours after eating. WHAT FOODS ARE NOT RECOMMENDED? The following are some foods and drinks that may worsen your symptoms: Vegetables Tomatoes. Tomato juice. Tomato and spaghetti sauce. Chili peppers. Onion and garlic. Horseradish. Fruits Oranges, grapefruit, and lemon (fruit and juice). Meats High-fat meats, fish, and poultry. This includes hot dogs, ribs, ham, sausage, salami, and bacon. Dairy Whole milk and chocolate milk. Sour cream. Cream. Butter. Ice cream. Cream cheese.  Beverages Coffee and tea, with or without caffeine. Carbonated beverages or energy drinks. Condiments Hot sauce. Barbecue sauce.  Sweets/Desserts Chocolate and cocoa. Donuts. Peppermint and spearmint. Fats and Oils High-fat foods, including [place] fries and potato chips. Other Vinegar. Strong spices, such as black pepper, white pepper, red pepper, cayenne, curry powder, cloves, ginger, and chili powder. The items listed above may not be a complete list of foods and beverages to avoid. Contact your dietitian for more information. Document Released: 07/25/2005 Document Revised: 07/30/2013 Document Reviewed: 05/29/2013 [facility] Patient Information 2015 [place], [place]. This information is not intended to replace advice given to you by your health care provider. Make sure you discuss any questions you have with your health care provider.

## 2014-12-21 NOTE — ED Notes (Signed)
Pt states he is vomiting blood.

## 2014-12-21 NOTE — ED Notes (Signed)
Presents with 2-3 months of "heart pain" associated with vomintg and burning in chest. Denies SOB. Pain is worse after eating.
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
## 2015-02-27 ENCOUNTER — Encounter (HOSPITAL_COMMUNITY): Payer: Self-pay | Admitting: *Deleted

## 2015-02-27 ENCOUNTER — Emergency Department (HOSPITAL_COMMUNITY): Payer: BLUE CROSS/BLUE SHIELD

## 2015-02-27 ENCOUNTER — Emergency Department (HOSPITAL_COMMUNITY)
Admission: EM | Admit: 2015-02-27 | Discharge: 2015-02-27 | Disposition: A | Discharge status: Home / Self Care | Payer: BLUE CROSS/BLUE SHIELD | Attending: Emergency Medicine | Admitting: Emergency Medicine

## 2015-02-27 DIAGNOSIS — Y998 Other external cause status: Secondary | ICD-10-CM | POA: Insufficient documentation

## 2015-02-27 DIAGNOSIS — Z87828 Personal history of other (healed) physical injury and trauma: Secondary | ICD-10-CM | POA: Diagnosis not present

## 2015-02-27 DIAGNOSIS — Y9289 Other specified places as the place of occurrence of the external cause: Secondary | ICD-10-CM | POA: Diagnosis not present

## 2015-02-27 DIAGNOSIS — S8992XA Unspecified injury of left lower leg, initial encounter: Secondary | ICD-10-CM | POA: Insufficient documentation

## 2015-02-27 DIAGNOSIS — Y9389 Activity, other specified: Secondary | ICD-10-CM | POA: Diagnosis not present

## 2015-02-27 DIAGNOSIS — M25462 Effusion, left knee: Secondary | ICD-10-CM | POA: Insufficient documentation

## 2015-02-27 DIAGNOSIS — S90414A Abrasion, right lesser toe(s), initial encounter: Secondary | ICD-10-CM | POA: Diagnosis not present

## 2015-02-27 DIAGNOSIS — Z9889 Other specified postprocedural states: Secondary | ICD-10-CM | POA: Insufficient documentation

## 2015-02-27 DIAGNOSIS — Z87891 Personal history of nicotine dependence: Secondary | ICD-10-CM | POA: Diagnosis not present

## 2015-02-27 DIAGNOSIS — W1839XA Other fall on same level, initial encounter: Secondary | ICD-10-CM | POA: Insufficient documentation

## 2015-02-27 DIAGNOSIS — Z8781 Personal history of (healed) traumatic fracture: Secondary | ICD-10-CM | POA: Insufficient documentation

## 2015-02-27 DIAGNOSIS — Z79899 Other long term (current) drug therapy: Secondary | ICD-10-CM | POA: Diagnosis not present

## 2015-02-27 DIAGNOSIS — Z8679 Personal history of other diseases of the circulatory system: Secondary | ICD-10-CM | POA: Diagnosis not present

## 2015-02-27 DIAGNOSIS — M25562 Pain in left knee: Secondary | ICD-10-CM

## 2015-02-27 MED ORDER — HYDROCODONE-ACETAMINOPHEN 5-325 MG PO TABS: 1.0000 | ORAL_TABLET | Freq: Two times a day (BID) | ORAL | Status: DC | PRN
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
## 2015-02-27 NOTE — ED Notes (Signed)
Pt states he fell onto his left knee today. Pt denies LOC. Wants he knee checked out so he can go back to work tomorrow.

## 2015-02-27 NOTE — Discharge Instructions (Signed)
Knee Effusion Troy Bishop, your x-ray shows an effusion in your knee, this is likely from the fall. Take ibuprofen 800 mg every 8 hours for pain at home. Also use ice on your knee 3 times a day for 15 minutes. Keep your knee elevated to reduce the swelling. If your pain becomes more severe, take Norco. See Dr. Victorino Dike within 3 days for close follow-up. If any symptoms worsen come back to the emergency department immediately. Thank you.  ???? ???, ????? ????-?? ????? ???? ?? ?? effusion, ?? ?????? ???? ??????? ? ???????? ???? ????? ???? ibuprofen 800 ????????? ???? 8 ????? ????????? ???? 15 ????? 3 ??? ?? ??? ????? ???? ?? ??? ?????? ?????????? ????? ???? ?? ??????? ?? ???? ???? ?????? ??????? ???? ?? ?????? ????? ???, ?????? ???? 3 ???? ????-?? ???? ??? ????? ?? ????? ??????????? ??? ???? ??? ????? ???????? ?????? ???????? ????? ??? worsen? ????????    Knee effusion means you have fluid in your knee. The knee may be more difficult to bend and move. HOME CARE  Use crutches or a brace as told by your doctor.  Put ice on the injured area.  Put ice in a plastic bag.  Place a towel between your skin and the bag.  Leave the ice on for 15-20 minutes, 03-04 times a day.  Raise (elevate) your knee as much as possible.  Only take medicine as told by your doctor.  You may need to do strengthening exercises. Ask your doctor.  Continue with your normal diet and activities as told by your doctor. GET HELP RIGHT AWAY IF:  You have more puffiness (swelling) in your knee.  You see redness, puffiness, or have more pain in your knee.  You have a temperature by mouth above 102 F (38.9 C).  You get a rash.  You have trouble breathing.  You have a reaction to any medicine you are taking.  You have a lot of pain when you move your knee. MAKE SURE YOU:  Understand these instructions.  Will watch your condition.  Will get help right away if you are not doing well or get worse. Document Released:  08/27/2010 Document Revised: 10/17/2011 Document Reviewed: 08/27/2010 [name] Patient Information 2015 [place], [place]. This information is not intended to replace advice given to you by your health care provider. Make sure you discuss any questions you have with your health care provider.

## 2015-08-15 ENCOUNTER — Encounter (HOSPITAL_COMMUNITY): Payer: Self-pay | Admitting: Emergency Medicine

## 2015-08-15 ENCOUNTER — Emergency Department (HOSPITAL_COMMUNITY)
Admission: EM | Admit: 2015-08-15 | Discharge: 2015-08-15 | Disposition: A | Discharge status: Home / Self Care | Payer: Medicaid Other | Attending: Emergency Medicine | Admitting: Emergency Medicine

## 2015-08-15 ENCOUNTER — Emergency Department (HOSPITAL_COMMUNITY): Payer: BC Managed Care – PPO

## 2015-08-15 DIAGNOSIS — K209 Esophagitis, unspecified without bleeding: Secondary | ICD-10-CM

## 2015-08-15 DIAGNOSIS — Z791 Long term (current) use of non-steroidal anti-inflammatories (NSAID): Secondary | ICD-10-CM | POA: Insufficient documentation

## 2015-08-15 DIAGNOSIS — Z87828 Personal history of other (healed) physical injury and trauma: Secondary | ICD-10-CM | POA: Insufficient documentation

## 2015-08-15 DIAGNOSIS — Z8781 Personal history of (healed) traumatic fracture: Secondary | ICD-10-CM | POA: Insufficient documentation

## 2015-08-15 DIAGNOSIS — Z79899 Other long term (current) drug therapy: Secondary | ICD-10-CM | POA: Insufficient documentation

## 2015-08-15 DIAGNOSIS — Z87891 Personal history of nicotine dependence: Secondary | ICD-10-CM | POA: Insufficient documentation

## 2015-08-15 LAB — CBC
HEMATOCRIT: 44.3 % (ref 39.0–52.0)
Hemoglobin: 15 g/dL (ref 13.0–17.0)
MCH: 31.9 pg (ref 26.0–34.0)
MCHC: 33.9 g/dL (ref 30.0–36.0)
MCV: 94.3 fL (ref 78.0–100.0)
Platelets: 144 10*3/uL — ABNORMAL LOW (ref 150–400)
RBC: 4.7 MIL/uL (ref 4.22–5.81)
RDW: 13.3 % (ref 11.5–15.5)
WBC: 4.8 10*3/uL (ref 4.0–10.5)

## 2015-08-15 LAB — BASIC METABOLIC PANEL
Anion gap: 11 (ref 5–15)
BUN: 8 mg/dL (ref 6–20)
CO2: 25 mmol/L (ref 22–32)
Calcium: 8.9 mg/dL (ref 8.9–10.3)
Chloride: 109 mmol/L (ref 101–111)
Creatinine, Ser: 0.97 mg/dL (ref 0.61–1.24)
GFR calc Af Amer: >60 mL/min (ref >60)
GFR calc non Af Amer: >60 mL/min (ref >60)
GLUCOSE: 98 mg/dL (ref 65–99)
POTASSIUM: 3.7 mmol/L (ref 3.5–5.1)
SODIUM: 145 mmol/L (ref 135–145)

## 2015-08-15 MED ORDER — OMEPRAZOLE 20 MG PO CPDR: 20.0000 mg | DELAYED_RELEASE_CAPSULE | Freq: Every day | ORAL | Status: DC

## 2015-08-15 MED ORDER — SUCRALFATE 1 GM/10ML PO SUSP: 1.0000 g | Freq: Three times a day (TID) | ORAL | Status: DC
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
## 2015-08-15 NOTE — Discharge Instructions (Signed)
Esophagitis °Esophagitis is inflammation of the esophagus. The esophagus is the tube that carries food and liquids from your mouth to your stomach. Esophagitis can cause soreness or pain in the esophagus. This condition can make it difficult and painful to swallow.  °CAUSES °Most causes of esophagitis are not serious. Common causes of this condition include: °· Gastroesophageal reflux disease (GERD). This is when stomach contents move back up into the esophagus (reflux). °· Repeated vomiting. °· An allergic-type reaction, especially caused by food allergies (eosinophilic esophagitis). °· Injury to the esophagus by swallowing large pills with or without water, or swallowing certain types of medicines. °· Swallowing (ingesting) harmful chemicals, such as household cleaning products. °· Heavy alcohol use. °· An infection of the esophagus. This most often occurs in people who have a weakened immune system. °· Radiation or chemotherapy treatment for cancer. °· Certain diseases such as sarcoidosis, Crohn disease, and scleroderma. °SYMPTOMS °Symptoms of this condition include: °· Difficult or painful swallowing. °· Pain with swallowing acidic liquids, such as citrus juices. °· Pain with burping. °· Chest pain. °· Difficulty breathing. °· Nausea. °· Vomiting. °· Pain in the abdomen. °· Weight loss. °· Ulcers in the mouth. °· Patches of white material in the mouth (candidiasis). °· Fever. °· Coughing up blood or vomiting blood. °· Stool that is black, tarry, or bright red. °DIAGNOSIS °Your health care provider will take a medical history and perform a physical exam. You may also have other tests, including: °· An endoscopy to examine your stomach and esophagus with a small camera. °· A test that measures the acidity level in your esophagus. °· A test that measures how much pressure is on your esophagus. °· A barium swallow or modified barium swallow to show the shape, size, and functioning of your esophagus. °· Allergy  tests. °TREATMENT °Treatment for this condition depends on the cause of your esophagitis. In some cases, steroids or other medicines may be given to help relieve your symptoms or to treat the underlying cause of your condition. You may have to make some lifestyle changes, such as: °· Avoiding alcohol. °· Quitting smoking. °· Changing your diet. °· Exercising. °· Changing your sleep habits and your sleep environment. °HOME CARE INSTRUCTIONS °Take these actions to decrease your discomfort and to help avoid complications. °Diet °· Follow a diet as recommended by your health care provider. This may involve avoiding foods and drinks such as: °¨ Coffee and tea (with or without caffeine). °¨ Drinks that contain alcohol. °¨ Energy drinks and sports drinks. °¨ Carbonated drinks or sodas. °¨ Chocolate and cocoa. °¨ Peppermint and mint flavorings. °¨ Garlic and onions. °¨ Horseradish. °¨ Spicy and acidic foods, including peppers, chili powder, curry powder, vinegar, hot sauces, and barbecue sauce. °¨ Citrus fruit juices and citrus fruits, such as oranges, lemons, and limes. °¨ Tomato-based foods, such as red sauce, chili, salsa, and pizza with red sauce. °¨ Fried and fatty foods, such as donuts, french fries, potato chips, and high-fat dressings. °¨ High-fat meats, such as hot dogs and fatty cuts of red and white meats, such as rib eye steak, sausage, ham, and bacon. °¨ High-fat dairy items, such as whole milk, butter, and cream cheese. °· Eat small, frequent meals instead of large meals. °· Avoid drinking large amounts of liquid with your meals. °· Avoid eating meals during the 2-3 hours before bedtime. °· Avoid lying down right after you eat. °· Do not exercise right after you eat. °· Avoid foods and drinks that seem to   make your symptoms worse. °General Instructions °· Pay attention to any changes in your symptoms. °· Take over-the-counter and prescription medicines only as told by your health care provider. Do not take  aspirin, ibuprofen, or other NSAIDs unless your health care provider told you to do so. °· If you have trouble taking pills, use a pill splitter to decrease the size of the pill. This will decrease the chance of the pill getting stuck or injuring your esophagus on the way down. Also, drink water after you take a pill. °· Do not use any tobacco products, including cigarettes, chewing tobacco, and e-cigarettes. If you need help quitting, ask your health care provider. °· Wear loose-fitting clothing. Do not wear anything tight around your waist that causes pressure on your abdomen. °· Raise (elevate) the head of your bed about 6 inches (15 cm). °· Try to reduce your stress, such as with yoga or meditation. If you need help reducing stress, ask your health care provider. °· If you are overweight, reduce your weight to an amount that is healthy for you. Ask your health care provider for guidance about a safe weight loss goal. °· Keep all follow-up visits as told by your health care provider. This is important. °SEEK MEDICAL CARE IF: °· You have new symptoms. °· You have unexplained weight loss. °· You have difficulty swallowing, or it hurts to swallow. °· You have wheezing or a persistent cough. °· Your symptoms do not improve with treatment. °· You have frequent heartburn for more than two weeks. °SEEK IMMEDIATE MEDICAL CARE IF: °· You have severe pain in your arms, neck, jaw, teeth, or back. °· You feel sweaty, dizzy, or light-headed. °· You have chest pain or shortness of breath. °· You vomit and your vomit looks like blood or coffee grounds. °· Your stool is bloody or black. °· You have a fever. °· You cannot swallow, drink, or eat. °  °This information is not intended to replace advice given to you by your health care provider. Make sure you discuss any questions you have with your health care provider. °  °Document Released: 09/01/2004 Document Revised: 04/15/2015 Document Reviewed: 11/19/2014 °Elsevier Interactive  Patient Education ©2016 [employer] ° °

## 2015-08-15 NOTE — ED Notes (Signed)
Patient C/O abdominal pain and reflux when he eats spicy foods. Denies pain at this time.

## 2015-08-15 NOTE — ED Notes (Signed)
Pt verbalized understanding of d/c instructions and has no further questions. Pt stable and NAD.  

## 2015-08-15 NOTE — ED Notes (Signed)
Pt from home for eval of ongoing chest pain x2 months, pt states today he ate spicy food and pain got worse. Pt reports taking "my wifes pain killer prescription but I dont know what it is." with no relief. Pt in nad at this time. Denies any n/v/d or fevers.

## 2016-02-10 ENCOUNTER — Ambulatory Visit (HOSPITAL_COMMUNITY)
Admission: EM | Admit: 2016-02-10 | Discharge: 2016-02-10 | Disposition: A | Discharge status: Home / Self Care | Payer: BLUE CROSS/BLUE SHIELD | Attending: Emergency Medicine | Admitting: Emergency Medicine

## 2016-02-10 ENCOUNTER — Encounter (HOSPITAL_COMMUNITY): Payer: Self-pay | Admitting: Family Medicine

## 2016-02-10 DIAGNOSIS — K209 Esophagitis, unspecified without bleeding: Secondary | ICD-10-CM

## 2016-02-10 MED ORDER — OMEPRAZOLE 20 MG PO CPDR: 20.0000 mg | DELAYED_RELEASE_CAPSULE | Freq: Every day | ORAL | Status: DC

## 2016-02-10 MED ORDER — SUCRALFATE 1 GM/10ML PO SUSP: 1.0000 g | Freq: Three times a day (TID) | ORAL | Status: DC

## 2016-02-10 NOTE — ED Notes (Signed)
Pt here for chest pain, burning with eating. sts epigastric. sts also slight blood tinged cough.

## 2016-02-10 NOTE — Discharge Instructions (Signed)
Start Prilosec once daily and Carafate 4 times a day. Need to continue medication. You need to call a stomach (GI) doctor to schedule an appointment for further evaluation.   Esophagitis Esophagitis is inflammation of the esophagus. The esophagus is the tube that carries food and liquids from your mouth to your stomach. Esophagitis can cause soreness or pain in the esophagus. This condition can make it difficult and painful to swallow.  CAUSES Most causes of esophagitis are not serious. Common causes of this condition include:  Gastroesophageal reflux disease (GERD). This is when stomach contents move back up into the esophagus (reflux).  Repeated vomiting.  An allergic-type reaction, especially caused by food allergies (eosinophilic esophagitis).  Injury to the esophagus by swallowing large pills with or without water, or swallowing certain types of medicines.  Swallowing (ingesting) harmful chemicals, such as household cleaning products.  Heavy alcohol use.  An infection of the esophagus.This most often occurs in people who have a weakened immune system.  Radiation or chemotherapy treatment for cancer.  Certain diseases such as sarcoidosis, Crohn disease, and scleroderma. SYMPTOMS Symptoms of this condition include:  Difficult or painful swallowing.  Pain with swallowing acidic liquids, such as citrus juices.  Pain with burping.  Chest pain.  Difficulty breathing.  Nausea.  Vomiting.  Pain in the abdomen.  Weight loss.  Ulcers in the mouth.  Patches of white material in the mouth (candidiasis).  Fever.  Coughing up blood or vomiting blood.  Stool that is black, tarry, or bright red. DIAGNOSIS Your health care provider will take a medical history and perform a physical exam. You may also have other tests, including:  An endoscopy to examine your stomach and esophagus with a small camera.  A test that measures the acidity level in your esophagus.  A test  that measures how much pressure is on your esophagus.  A barium swallow or modified barium swallow to show the shape, size, and functioning of your esophagus.  Allergy tests. TREATMENT Treatment for this condition depends on the cause of your esophagitis. In some cases, steroids or other medicines may be given to help relieve your symptoms or to treat the underlying cause of your condition. You may have to make some lifestyle changes, such as:  Avoiding alcohol.  Quitting smoking.  Changing your diet.  Exercising.  Changing your sleep habits and your sleep environment. HOME CARE INSTRUCTIONS Take these actions to decrease your discomfort and to help avoid complications. Diet  Follow a diet as recommended by your health care provider. This may involve avoiding foods and drinks such as:  Coffee and tea (with or without caffeine).  Drinks that contain alcohol.  Energy drinks and sports drinks.  Carbonated drinks or sodas.  Chocolate and cocoa.  Peppermint and mint flavorings.  Garlic and onions.  Horseradish.  Spicy and acidic foods, including peppers, chili powder, curry powder, vinegar, hot sauces, and barbecue sauce.  Citrus fruit juices and citrus fruits, such as oranges, lemons, and limes.  Tomato-based foods, such as red sauce, chili, salsa, and pizza with red sauce.  Fried and fatty foods, such as donuts, french fries, potato chips, and high-fat dressings.  High-fat meats, such as hot dogs and fatty cuts of red and white meats, such as rib eye steak, sausage, ham, and bacon.  High-fat dairy items, such as whole milk, butter, and cream cheese.  Eat small, frequent meals instead of large meals.  Avoid drinking large amounts of liquid with your meals.  Avoid eating  meals during the 2-3 hours before bedtime.  Avoid lying down right after you eat.  Do not exercise right after you eat.  Avoid foods and drinks that seem to make your symptoms worse. General  Instructions  Pay attention to any changes in your symptoms.  Take over-the-counter and prescription medicines only as told by your health care provider. Do not take aspirin, ibuprofen, or other NSAIDs unless your health care provider told you to do so.  If you have trouble taking pills, use a pill splitter to decrease the size of the pill. This will decrease the chance of the pill getting stuck or injuring your esophagus on the way down. Also, drink water after you take a pill.  Do not use any tobacco products, including cigarettes, chewing tobacco, and e-cigarettes. If you need help quitting, ask your health care provider.  Wear loose-fitting clothing. Do not wear anything tight around your waist that causes pressure on your abdomen.  Raise (elevate) the head of your bed about 6 inches (15 cm).  Try to reduce your stress, such as with yoga or meditation. If you need help reducing stress, ask your health care provider.  If you are overweight, reduce your weight to an amount that is healthy for you. Ask your health care provider for guidance about a safe weight loss goal.  Keep all follow-up visits as told by your health care provider. This is important. SEEK MEDICAL CARE IF:  You have new symptoms.  You have unexplained weight loss.  You have difficulty swallowing, or it hurts to swallow.  You have wheezing or a persistent cough.  Your symptoms do not improve with treatment.  You have frequent heartburn for more than two weeks. SEEK IMMEDIATE MEDICAL CARE IF:  You have severe pain in your arms, neck, jaw, teeth, or back.  You feel sweaty, dizzy, or light-headed.  You have chest pain or shortness of breath.  You vomit and your vomit looks like blood or coffee grounds.  Your stool is bloody or black.  You have a fever.  You cannot swallow, drink, or eat.   This information is not intended to replace advice given to you by your health care provider. Make sure you discuss  any questions you have with your health care provider.   Document Released: 09/01/2004 Document Revised: 04/15/2015 Document Reviewed: 11/19/2014 Elsevier Interactive Patient Education [employer]   You may need to have this procedure. You need to talk with a stomach doctor.  Esophagogastroduodenoscopy Esophagogastroduodenoscopy (EGD) is a procedure that is used to examine the lining of the esophagus, stomach, and first part of the small intestine (duodenum). A long, flexible, lighted tube with a camera attached (endoscope) is inserted down the throat to view these organs. This procedure is done to detect problems or abnormalities, such as inflammation, bleeding, ulcers, or growths, in order to treat them. The procedure lasts 5-20 minutes. It is usually an outpatient procedure, but it may need to be performed in a hospital in emergency cases. LET [facility] CARE PROVIDER KNOW ABOUT:  Any allergies you have.  All medicines you are taking, including vitamins, herbs, eye drops, creams, and over-the-counter medicines.  Previous problems you or members of your family have had with the use of anesthetics.  Any blood disorders you have.  Previous surgeries you have had.  Medical conditions you have. RISKS AND COMPLICATIONS Generally, this is a safe procedure. However, problems can occur and include:  Infection.  Bleeding.  Tearing (perforation) of  the esophagus, stomach, or duodenum.  Difficulty breathing or not being able to breathe.  Excessive sweating.  Spasms of the larynx.  Slowed heartbeat.  Low blood pressure. BEFORE THE PROCEDURE  Do not eat or drink anything after midnight on the night before the procedure or as directed by your health care provider.  Do not take your regular medicines before the procedure if your health care provider asks you not to. Ask your health care provider about changing or stopping those medicines.  If you wear dentures, be prepared  to remove them before the procedure.  Arrange for someone to drive you home after the procedure. PROCEDURE  A numbing medicine (local anesthetic) may be sprayed in your throat for comfort and to stop you from gagging or coughing.  You will have an IV tube inserted in a vein in your hand or arm. You will receive medicines and fluids through this tube.  You will be given a medicine to relax you (sedative).  A pain reliever will be given through the IV tube.  A mouth guard may be placed in your mouth to protect your teeth and to keep you from biting on the endoscope.  You will be asked to lie on your left side.  The endoscope will be inserted down your throat and into your esophagus, stomach, and duodenum.  Air will be put through the endoscope to allow your health care provider to clearly view the lining of your esophagus.  The lining of your esophagus, stomach, and duodenum will be examined. During the exam, your health care provider may:  Remove tissue to be examined under a microscope (biopsy) for inflammation, infection, or other medical problems.  Remove growths.  Remove objects (foreign bodies) that are stuck.  Treat any bleeding with medicines or other devices that stop tissues from bleeding (hot cautery, clipping devices).  Widen (dilate) or stretch narrowed areas of your esophagus and stomach.  The endoscope will be withdrawn. AFTER THE PROCEDURE  You will be taken to a recovery area for observation. Your blood pressure, heart rate, breathing rate, and blood oxygen level will be monitored often until the medicines you were given have worn off.  Do not eat or drink anything until the numbing medicine has worn off and your gag reflex has returned. You may choke.  Your health care provider should be able to discuss his or her findings with you. It will take longer to discuss the test results if any biopsies were taken.   This information is not intended to replace advice  given to you by your health care provider. Make sure you discuss any questions you have with your health care provider.   Document Released: 11/25/2004 Document Revised: 08/15/2014 Document Reviewed: 06/27/2012 Elsevier Interactive Patient Education [employer]
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
## 2016-03-03 ENCOUNTER — Telehealth (HOSPITAL_COMMUNITY): Payer: Self-pay | Admitting: Emergency Medicine

## 2016-03-03 MED ORDER — SUCRALFATE 1 G PO TABS: 1.0000 g | ORAL_TABLET | Freq: Four times a day (QID) | ORAL | 0 refills | Status: DC

## 2016-03-03 NOTE — Telephone Encounter (Signed)
The patient called and stated that the cost of the liquid carafate was too expensive and requested the pills. Rosalie Gums approved the change to pills and the new prescription was escribed.

## 2016-04-03 ENCOUNTER — Encounter (HOSPITAL_COMMUNITY): Payer: Self-pay | Admitting: Emergency Medicine

## 2016-04-03 ENCOUNTER — Ambulatory Visit (INDEPENDENT_AMBULATORY_CARE_PROVIDER_SITE_OTHER): Payer: Self-pay

## 2016-04-03 ENCOUNTER — Ambulatory Visit (HOSPITAL_COMMUNITY)
Admission: EM | Admit: 2016-04-03 | Discharge: 2016-04-03 | Disposition: A | Discharge status: Home / Self Care | Payer: BLUE CROSS/BLUE SHIELD | Attending: Nurse Practitioner | Admitting: Nurse Practitioner

## 2016-04-03 DIAGNOSIS — M5431 Sciatica, right side: Secondary | ICD-10-CM | POA: Diagnosis not present

## 2016-04-03 MED ORDER — METHYLPREDNISOLONE SODIUM SUCC 125 MG IJ SOLR
125.0000 mg | Freq: Once | INTRAMUSCULAR | Status: AC
  Administered 2016-04-03: 125 mg via INTRAMUSCULAR

## 2016-04-03 MED ORDER — METHYLPREDNISOLONE SODIUM SUCC 125 MG IJ SOLR
INTRAMUSCULAR | Status: AC
  Filled 2016-04-03: qty 2

## 2016-04-03 MED ORDER — KETOROLAC TROMETHAMINE 60 MG/2ML IM SOLN
INTRAMUSCULAR | Status: AC
  Filled 2016-04-03: qty 2

## 2016-04-03 MED ORDER — IBUPROFEN 800 MG PO TABS: 800.0000 mg | ORAL_TABLET | Freq: Three times a day (TID) | ORAL | 0 refills | Status: AC

## 2016-04-03 MED ORDER — KETOROLAC TROMETHAMINE 60 MG/2ML IM SOLN
60.0000 mg | Freq: Once | INTRAMUSCULAR | Status: AC
  Administered 2016-04-03: 60 mg via INTRAMUSCULAR
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
## 2016-04-22 ENCOUNTER — Encounter (HOSPITAL_COMMUNITY): Payer: Self-pay | Admitting: Emergency Medicine

## 2016-04-22 ENCOUNTER — Ambulatory Visit (HOSPITAL_COMMUNITY)
Admission: EM | Admit: 2016-04-22 | Discharge: 2016-04-22 | Disposition: A | Discharge status: Home / Self Care | Payer: BLUE CROSS/BLUE SHIELD | Attending: Family Medicine | Admitting: Family Medicine

## 2016-04-22 ENCOUNTER — Ambulatory Visit (INDEPENDENT_AMBULATORY_CARE_PROVIDER_SITE_OTHER): Payer: BLUE CROSS/BLUE SHIELD

## 2016-04-22 DIAGNOSIS — R630 Anorexia: Secondary | ICD-10-CM

## 2016-04-22 DIAGNOSIS — R110 Nausea: Secondary | ICD-10-CM | POA: Diagnosis not present

## 2016-04-22 DIAGNOSIS — R6881 Early satiety: Secondary | ICD-10-CM

## 2016-04-22 DIAGNOSIS — K5901 Slow transit constipation: Secondary | ICD-10-CM | POA: Diagnosis not present

## 2016-04-22 MED ORDER — POLYETHYLENE GLYCOL 3350 17 GM/SCOOP PO POWD: ORAL | 0 refills | Status: DC

## 2016-04-22 NOTE — Discharge Instructions (Signed)
Your x-ray shows that you have stool retained in your:. This in combination with a relatively normal abdominal exam, absence of fever and abdominal pain suggest that you need to have a good colon cleansing. Recommend you use MiraLAX as directed. May also use omeprazole that was previously prescribed for epigastric discomfort. Color telephone numbers listed in your instructions to obtain a primary care doctor for follow-up. He may need additional testing.

## 2016-04-22 NOTE — ED Triage Notes (Signed)
Pt here for abd pain onset x5 associated w/n/v, weakness and decreased appetite  Denies fevers, chills  A&O x4... NAD
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
## 2016-05-09 ENCOUNTER — Encounter: Payer: Self-pay | Admitting: Gastroenterology

## 2016-07-12 ENCOUNTER — Ambulatory Visit (INDEPENDENT_AMBULATORY_CARE_PROVIDER_SITE_OTHER): Payer: BLUE CROSS/BLUE SHIELD | Admitting: Gastroenterology

## 2016-07-12 ENCOUNTER — Encounter: Payer: Self-pay | Admitting: Gastroenterology

## 2016-07-12 ENCOUNTER — Encounter (INDEPENDENT_AMBULATORY_CARE_PROVIDER_SITE_OTHER): Payer: Self-pay

## 2016-07-12 VITALS — BP 118/84 | HR 82 | Ht 68.0 in | Wt 150.0 lb

## 2016-07-12 DIAGNOSIS — R1013 Epigastric pain: Secondary | ICD-10-CM | POA: Diagnosis not present
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
## 2016-07-12 NOTE — Patient Instructions (Signed)
If you are age 26 or older, your body mass index should be between 23-30. Your Body mass index is 22.81 kg/m. If this is out of the aforementioned range listed, please consider follow up with your Primary Care Provider.  If you are age 26 or younger, your body mass index should be between 19-25. Your Body mass index is 22.81 kg/m. If this is out of the aformentioned range listed, please consider follow up with your Primary Care Provider.   You have been scheduled for an endoscopy. Please follow written instructions given to you at your visit today. If you use inhalers (even only as needed), please bring them with you on the day of your procedure. Your physician has requested that you go to www.startemmi.com and enter the access code given to you at your visit today. This web site gives a general overview about your procedure. However, you should still follow specific instructions given to you by our office regarding your preparation for the procedure.  Thank you for choosing [facility]  Dr Amada JupiterHenry Danis III

## 2016-07-18 ENCOUNTER — Encounter: Payer: Self-pay | Admitting: Gastroenterology

## 2016-07-25 ENCOUNTER — Ambulatory Visit (AMBULATORY_SURGERY_CENTER): Payer: BLUE CROSS/BLUE SHIELD | Admitting: Gastroenterology

## 2016-07-25 ENCOUNTER — Encounter: Payer: Self-pay | Admitting: Gastroenterology

## 2016-07-25 VITALS — BP 132/87 | HR 89 | Temp 99.4°F | Resp 16 | Ht 68.0 in | Wt 150.0 lb

## 2016-07-25 DIAGNOSIS — K210 Gastro-esophageal reflux disease with esophagitis: Secondary | ICD-10-CM | POA: Diagnosis not present

## 2016-07-25 DIAGNOSIS — R1013 Epigastric pain: Secondary | ICD-10-CM | POA: Diagnosis not present

## 2016-07-25 DIAGNOSIS — K2970 Gastritis, unspecified, without bleeding: Secondary | ICD-10-CM

## 2016-07-25 DIAGNOSIS — K2900 Acute gastritis without bleeding: Secondary | ICD-10-CM | POA: Diagnosis not present

## 2016-07-25 DIAGNOSIS — K2990 Gastroduodenitis, unspecified, without bleeding: Secondary | ICD-10-CM

## 2016-07-25 HISTORY — PX: UPPER GI ENDOSCOPY: SHX6162

## 2016-07-25 MED ORDER — SODIUM CHLORIDE 0.9 % IV SOLN: 500.0000 mL | INTRAVENOUS | Status: DC

## 2016-07-25 NOTE — Progress Notes (Signed)
Called to room to assist during endoscopic procedure.  Patient ID and intended procedure confirmed with present staff. Received instructions for my participation in the procedure from the performing physician.  

## 2016-07-25 NOTE — Progress Notes (Signed)
On discharge patient's wife and two children sent to get the car. On arrival with patient to the front door, patient tells the transporter they are taking the bus.  [facility] and informed.

## 2016-07-25 NOTE — Op Note (Signed)
[facility] Patient Name: Troy Bishop Procedure Date: 07/25/2016 10:12 AM MRN: [id number] Endoscopist: Sherilyn CooterHenry L. Myrtie Neitheranis , MD Age: [age] Referring MD:  Date of Birth: 10/20/1990 Gender: Male Account #: [id number] Procedure:                Upper GI endoscopy Indications:              Epigastric abdominal pain Medicines:                Monitored Anesthesia Care Procedure:                Pre-Anesthesia Assessment:                           - Prior to the procedure, a History and Physical                            was performed, and patient medications and                            allergies were reviewed. The patient's tolerance of                            previous anesthesia was also reviewed. The risks                            and benefits of the procedure and the sedation                            options and risks were discussed with the patient.                            All questions were answered, and informed consent                            was obtained. Prior Anticoagulants: The patient has                            taken no previous anticoagulant or antiplatelet                            agents. ASA Grade Assessment: II - A patient with                            mild systemic disease. After reviewing the risks                            and benefits, the patient was deemed in                            satisfactory condition to undergo the procedure.                           After obtaining informed consent, the endoscope was  passed under direct vision. Throughout the                            procedure, the patient's blood pressure, pulse, and                            oxygen saturations were monitored continuously. The                            Model GIF-HQ190 [id number](SN#[id number]) scope was introduced                            through the mouth, and advanced to the second part                            of duodenum. The upper GI  endoscopy was                            accomplished without difficulty. The patient                            tolerated the procedure. Scope In: Scope Out: Findings:                 LA Grade A (one or more mucosal breaks less than 5                            mm, not extending between tops of 2 mucosal folds)                            esophagitis was found.                           Diffuse mild inflammation characterized by erythema                            was found in the gastric antrum. Several biopsies                            were obtained in the gastric body and in the                            gastric antrum with cold forceps for histology.                           The cardia and gastric fundus were normal on                            retroflexion.                           The examined duodenum was normal. Complications:            No immediate complications. Estimated Blood Loss:     Estimated blood loss: none. Impression:               -  LA Grade A reflux esophagitis.                           - Chronic gastritis.                           - Normal examined duodenum.                           - Several biopsies were obtained in the gastric                            body and in the gastric antrum. Recommendation:           - Patient has a contact number available for                            emergencies. The signs and symptoms of potential                            delayed complications were discussed with the                            patient. Return to normal activities tomorrow.                            Written discharge instructions were provided to the                            patient.                           - Resume previous diet.                           - Continue present medications.                           - Await pathology results.                           - Decrease alcohol intake.                           Limit caffeine intake. Henry  L. Myrtie Neither, MD 07/25/2016 72:53:66 AM This report has been signed electronically.

## 2016-07-25 NOTE — Progress Notes (Signed)
A/ox3, pleased with MAC, report to RN 

## 2016-07-25 NOTE — Patient Instructions (Signed)
Handouts given: GERD, Gastritis, Esophagitis.    YOU HAD AN ENDOSCOPIC PROCEDURE TODAY AT THE [facility]:   Refer to the procedure report that was given to you for any specific questions about what was found during the examination.  If the procedure report does not answer your questions, please call your gastroenterologist to clarify.  If you requested that your care partner not be given the details of your procedure findings, then the procedure report has been included in a sealed envelope for you to review at your convenience later.  YOU SHOULD EXPECT: Some feelings of bloating in the abdomen. Passage of more gas than usual.  Walking can help get rid of the air that was put into your GI tract during the procedure and reduce the bloating. If you had a lower endoscopy (such as a colonoscopy or flexible sigmoidoscopy) you may notice spotting of blood in your stool or on the toilet paper. If you underwent a bowel prep for your procedure, you may not have a normal bowel movement for a few days.  Please Note:  You might notice some irritation and congestion in your nose or some drainage.  This is from the oxygen used during your procedure.  There is no need for concern and it should clear up in a day or so.  SYMPTOMS TO REPORT IMMEDIATELY:    Following upper endoscopy (EGD)  Vomiting of blood or coffee ground material  New chest pain or pain under the shoulder blades  Painful or persistently difficult swallowing  New shortness of breath  Fever of 100F or higher  Black, tarry-looking stools  For urgent or emergent issues, a gastroenterologist can be reached at any hour by calling [phone].   DIET:  We do recommend a small meal at first, but then you may proceed to your regular diet.  Drink plenty of fluids but you should avoid alcoholic beverages for 24 hours.  ACTIVITY:  You should plan to take it easy for the rest of today and you should NOT DRIVE or use heavy machinery  until tomorrow (because of the sedation medicines used during the test).    FOLLOW UP: Our staff will call the number listed on your records the next business day following your procedure to check on you and address any questions or concerns that you may have regarding the information given to you following your procedure. If we do not reach you, we will leave a message.  However, if you are feeling well and you are not experiencing any problems, there is no need to return our call.  We will assume that you have returned to your regular daily activities without incident.  If any biopsies were taken you will be contacted by phone or by letter within the next 1-3 weeks.  Please call us at [phone] if you have not heard about the biopsies in 3 weeks.    SIGNATURES/CONFIDENTIALITY: You and/or your care partner have signed paperwork which will be entered into your electronic medical record.  These signatures attest to the fact that that the information above on your After Visit Summary has been reviewed and is understood.  Full responsibility of the confidentiality of this discharge information lies with you and/or your care-partner.

## 2016-07-26 ENCOUNTER — Telehealth: Payer: Self-pay

## 2016-07-26 NOTE — Telephone Encounter (Signed)
  Follow up Call-  Call back number 07/25/2016  Post procedure Call Back phone  # [phone] or [phone] (vm left at this #)  Permission to leave phone message Yes  Some recent data might be hidden     Patient questions:  Do you have a fever, pain , or abdominal swelling? No. Pain Score  0 *  Have you tolerated food without any problems? Yes.    Have you been able to return to your normal activities? Yes.    Do you have any questions about your discharge instructions: Diet   No. Medications  No. Follow up visit  No.  Do you have questions or concerns about your Care? No.  Actions: * If pain score is 4 or above: No action needed, pain <4.  Pt was at work.  I spoke with hs wife and she reported no problems. maw

## 2016-08-04 ENCOUNTER — Encounter: Payer: Self-pay | Admitting: Gastroenterology

## 2016-08-04 ENCOUNTER — Telehealth: Payer: Self-pay | Admitting: Gastroenterology

## 2016-08-04 ENCOUNTER — Other Ambulatory Visit: Payer: Self-pay

## 2016-08-04 MED ORDER — CLARITHROMYCIN 250 MG PO TABS: 500.0000 mg | ORAL_TABLET | Freq: Two times a day (BID) | ORAL | 0 refills | Status: AC

## 2016-08-04 MED ORDER — OMEPRAZOLE 20 MG PO CPDR: 20.0000 mg | DELAYED_RELEASE_CAPSULE | Freq: Two times a day (BID) | ORAL | 0 refills | Status: DC

## 2016-08-04 MED ORDER — AMOXICILLIN 500 MG PO TABS: 1000.0000 mg | ORAL_TABLET | Freq: Two times a day (BID) | ORAL | 0 refills | Status: AC

## 2016-08-18 ENCOUNTER — Telehealth: Payer: Self-pay

## 2016-08-18 ENCOUNTER — Other Ambulatory Visit: Payer: Self-pay

## 2016-08-18 DIAGNOSIS — A048 Other specified bacterial intestinal infections: Secondary | ICD-10-CM

## 2016-08-18 NOTE — Telephone Encounter (Signed)
-----   Message from Abriella Filkins M Fournier, RN sent at 08/04/2016  3:12 PM EST ----- Schedule urea breath test @ [facility]. Patient to finish up antibiotics on 1/12. 

## 2016-08-18 NOTE — Telephone Encounter (Signed)
Left message for patient or his wife to call back, [facility] if H. Pylori has been cleared up. [facility]

## 2016-08-19 NOTE — Telephone Encounter (Signed)
-----   Message from Leverne HumblesJulia M Fournier, RN sent at 08/04/2016  3:12 PM EST ----- Schedule urea breath test @ [facility]. Patient to finish up antibiotics on 1/12.

## 2016-08-19 NOTE — Telephone Encounter (Signed)
I have not heard back from patient or his wife. I have mailed prep instructions and appointment date/time.

## 2016-08-19 NOTE — Progress Notes (Signed)
Breath test instructions mailed, no answer or call back on his phone.

## 2016-08-22 ENCOUNTER — Telehealth: Payer: Self-pay | Admitting: Gastroenterology

## 2016-08-22 NOTE — Telephone Encounter (Signed)
Done

## 2016-08-22 NOTE — Telephone Encounter (Signed)
Pt contacted. He states he feels like he needs more antibiotic treatment. He stats he doesn't feel well after eating. Pt does have a follow up on 08-31-2016. Please advise.

## 2016-08-23 ENCOUNTER — Other Ambulatory Visit: Payer: Self-pay

## 2016-08-23 NOTE — Telephone Encounter (Signed)
Left message to return call 

## 2016-08-23 NOTE — Telephone Encounter (Signed)
Please see the treatment plan for his H pylori.  I do not think he has had his urea breath test to see if the infection has cleared.  Also, I reiterate my previous recommendation to stop all alcohol use for a period of a month as well.

## 2016-08-31 ENCOUNTER — Ambulatory Visit (INDEPENDENT_AMBULATORY_CARE_PROVIDER_SITE_OTHER): Payer: BLUE CROSS/BLUE SHIELD | Admitting: Gastroenterology

## 2016-08-31 ENCOUNTER — Encounter (INDEPENDENT_AMBULATORY_CARE_PROVIDER_SITE_OTHER): Payer: Self-pay

## 2016-08-31 ENCOUNTER — Encounter: Payer: Self-pay | Admitting: Gastroenterology

## 2016-08-31 VITALS — BP 120/96 | HR 80 | Ht 65.55 in | Wt 152.4 lb

## 2016-08-31 DIAGNOSIS — A048 Other specified bacterial intestinal infections: Secondary | ICD-10-CM

## 2016-08-31 DIAGNOSIS — R1013 Epigastric pain: Secondary | ICD-10-CM | POA: Diagnosis not present

## 2016-08-31 DIAGNOSIS — G8929 Other chronic pain: Secondary | ICD-10-CM | POA: Diagnosis not present

## 2016-08-31 NOTE — Telephone Encounter (Signed)
Pt is here today for his office visit. details will be discussed about antibiotics.

## 2016-08-31 NOTE — Progress Notes (Signed)
[facility] Progress Note  Chief Complaint: Epigastric pain and H. pylori  Subjective  History:  This is office follow-up for 26 year old man who saw me last month for epigastric pain. His upper endoscopy showed a mild antral gastritis, biopsies were positive for H. pylori. He was treated with amoxicillin and clarithromycin and omeprazole. He was seen today with the aid of a Nepalese video interpreter. It seems that he felt better while on antibiotics, but it gave him diarrhea. Nevertheless, he did complete the treatment course. He is no longer taking any antacids and he still feels dyspepsia with occasional heartburn especially after spicy foods. He denies dysphagia or odynophagia. He still has 3 beers every night, despite having been counseled to stop that for any possible affected is having on this abdominal pain. He denies dysphagia, odynophagia, early satiety or weight loss.  ROS: Cardiovascular:  no chest pain Respiratory: no dyspnea  The patient's Past Medical, Family and Social History were reviewed and are on file in the EMR.  Objective:  Med list reviewed  Vital signs in last 24 hrs: Vitals:   08/31/16 0845  BP: (!) 120/96  Pulse: 80    Physical Exam    HEENT: sclera anicteric, oral mucosa moist without lesions  Neck: supple, no thyromegaly, JVD or lymphadenopathy  Cardiac: RRR without murmurs, S1S2 heard, no peripheral edema  Pulm: clear to auscultation bilaterally, normal RR and effort noted  Abdomen: soft, No tenderness, with active bowel sounds. No guarding or palpable hepatosplenomegaly.  Skin; warm and dry, no jaundice or rash  Recent Labs:  Biopsy positive for H. pylori   @ASSESSMENTPLANBEGIN @ Assessment: Encounter Diagnoses  Name Primary?  . Abdominal pain, chronic, epigastric Yes  . H. pylori infection     His endoscopic findings were explained to him. I also explained that I'm not sure how much of this is H. pylori versus alcohol use. He is  already scheduled for an H. pylori breath test a week from tomorrow. The diarrhea was almost certainly from the clarithromycin, but possibly from PPI. I recommended OTC Zantac, but no dose after this coming Saturday so he can be off it 5 days prior to his UBT.   Stop alcohol use for the time being, since it is the only way we will know its relation to this pain.   Total time 25 minutes, over half spent in counseling and coordination of care.   Charlie PitterHenry L Danis III

## 2016-08-31 NOTE — Patient Instructions (Addendum)
Take ranitidine (over the counter antacid medicine) 150 mg twice daily as needed for stomach pain.    As we discussed, do not take it for 5 days prior to your test next Thursday.  We will call you with test results.  If you are age 27 or older, your body mass index should be between 23-30. Your Body mass index is 24.93 kg/m. If this is out of the aforementioned range listed, please consider follow up with your Primary Care Provider.  If you are age 27 or younger, your body mass index should be between 19-25. Your Body mass index is 24.93 kg/m. If this is out of the aformentioned range listed, please consider follow up with your Primary Care Provider.   Thank you for choosing [facility]  Dr Amada JupiterHenry Danis III

## 2016-09-08 ENCOUNTER — Encounter (HOSPITAL_COMMUNITY)
Admission: RE | Disposition: A | Discharge status: Home / Self Care | Payer: Self-pay | Source: Ambulatory Visit | Attending: Gastroenterology

## 2016-09-08 ENCOUNTER — Ambulatory Visit (HOSPITAL_COMMUNITY)
Admission: RE | Admit: 2016-09-08 | Discharge: 2016-09-08 | Disposition: A | Discharge status: Home / Self Care | Payer: BLUE CROSS/BLUE SHIELD | Source: Ambulatory Visit | Attending: Gastroenterology | Admitting: Gastroenterology

## 2016-09-08 DIAGNOSIS — B9681 Helicobacter pylori [H. pylori] as the cause of diseases classified elsewhere: Secondary | ICD-10-CM | POA: Diagnosis not present

## 2016-09-08 DIAGNOSIS — Z0189 Encounter for other specified special examinations: Secondary | ICD-10-CM | POA: Insufficient documentation

## 2016-09-08 HISTORY — PX: BREATH TEK H PYLORI: SHX5422

## 2016-09-08 SURGERY — BREATH TEST, FOR HELICOBACTER PYLORI
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
## 2016-09-11 ENCOUNTER — Encounter (HOSPITAL_COMMUNITY): Payer: Self-pay | Admitting: Gastroenterology

## 2016-09-14 ENCOUNTER — Telehealth: Payer: Self-pay | Admitting: Gastroenterology

## 2016-09-14 NOTE — Telephone Encounter (Signed)
Have you seen any results on his breath test? Thank you.

## 2016-09-15 ENCOUNTER — Telehealth: Payer: Self-pay

## 2016-09-15 NOTE — Telephone Encounter (Signed)
Patient advised of results and to continue to take his medication. Told patient that if he needs refill to have his pharmacy contact our office. He also asked about getting a copy of his records to take to his PCP. Sent message to LimaBarbara regarding records.

## 2016-09-15 NOTE — Telephone Encounter (Signed)
Left message for patient with results. See result note.

## 2016-09-15 NOTE — Progress Notes (Signed)
   09/08/16 1444  BREATH TEK ASSESSMENT  Referring MD danis  Time of Last PO Intake 0000  Baseline Breath At: 0803  Pranactin Given At: 0805  Post-Dose Breath At: 0820  Sample 1 2.3  Sample 2 1.7  Test Negative

## 2016-09-15 NOTE — Telephone Encounter (Signed)
I have not seen it.  [facility] typically send those to us within a couple days. Please call them to make sure it was done and ask for verbal result and final report sent to us.

## 2016-09-15 NOTE — Telephone Encounter (Signed)
Pt states he is returning a phone call to HenningJulie regarding results. Best call back number is [phone].

## 2016-09-15 NOTE — Telephone Encounter (Signed)
[facility], verbal report of negative test. She is faxing report and putting it in the system for final report.

## 2016-09-16 ENCOUNTER — Other Ambulatory Visit: Payer: Self-pay | Admitting: Gastroenterology

## 2016-09-26 ENCOUNTER — Telehealth: Payer: Self-pay | Admitting: Gastroenterology

## 2016-09-27 NOTE — Telephone Encounter (Signed)
Left a message to return call.  

## 2016-09-27 NOTE — Telephone Encounter (Signed)
It can be purchased generic over the counter, which I have also told him before.

## 2016-09-27 NOTE — Telephone Encounter (Signed)
I told him omeprazole once daily.  He does not need both

## 2016-09-27 NOTE — Telephone Encounter (Signed)
Can I refill? Omeprazole 20 mg 1 a day?

## 2016-09-27 NOTE — Telephone Encounter (Signed)
Pt is asking for acid reducers. Does he need to be on Omeprazole or Zantac. During his last office note you recommended Zantac and on the last test results you recommended Omeprazole. Should he be on both or just one of them? Please advise.

## 2016-10-03 NOTE — Telephone Encounter (Signed)
Pt aware to get Prilosec over the counter.

## 2016-10-12 ENCOUNTER — Ambulatory Visit (HOSPITAL_COMMUNITY)
Admission: EM | Admit: 2016-10-12 | Discharge: 2016-10-12 | Disposition: A | Discharge status: Home / Self Care | Payer: BLUE CROSS/BLUE SHIELD | Attending: Family Medicine | Admitting: Family Medicine

## 2016-10-12 ENCOUNTER — Encounter (HOSPITAL_COMMUNITY): Payer: Self-pay | Admitting: *Deleted

## 2016-10-12 DIAGNOSIS — L730 Acne keloid: Secondary | ICD-10-CM | POA: Diagnosis not present

## 2016-10-12 MED ORDER — CLINDAMYCIN PHOS-BENZOYL PEROX 1-5 % EX GEL: Freq: Two times a day (BID) | CUTANEOUS | 0 refills | Status: DC

## 2016-10-12 MED ORDER — DOXYCYCLINE HYCLATE 100 MG PO CAPS: 100.0000 mg | ORAL_CAPSULE | Freq: Two times a day (BID) | ORAL | 3 refills | Status: DC

## 2016-10-12 NOTE — Discharge Instructions (Signed)
Please take medication once in the morning and once at night. Please do not take with dairy products. Can use gel that I have prescribed. Consider shaving area to help keep this issue away
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
## 2016-10-12 NOTE — ED Triage Notes (Signed)
Pt  Reports   Rash  On  Back  Of  Neck         For  Several  Days   Pt  Reports  Has  Had   Similar  Rash off  And  On in past  But is  Worse   Now

## 2016-12-02 ENCOUNTER — Encounter (HOSPITAL_COMMUNITY): Payer: Self-pay | Admitting: Family Medicine

## 2016-12-02 ENCOUNTER — Ambulatory Visit (HOSPITAL_COMMUNITY)
Admission: EM | Admit: 2016-12-02 | Discharge: 2016-12-02 | Disposition: A | Discharge status: Home / Self Care | Payer: BLUE CROSS/BLUE SHIELD | Attending: Family Medicine | Admitting: Family Medicine

## 2016-12-02 DIAGNOSIS — S91332A Puncture wound without foreign body, left foot, initial encounter: Secondary | ICD-10-CM | POA: Diagnosis not present

## 2016-12-02 DIAGNOSIS — Z23 Encounter for immunization: Secondary | ICD-10-CM

## 2016-12-02 MED ORDER — TETANUS-DIPHTH-ACELL PERTUSSIS 5-2.5-18.5 LF-MCG/0.5 IM SUSP
0.5000 mL | Freq: Once | INTRAMUSCULAR | Status: AC
  Administered 2016-12-02: 0.5 mL via INTRAMUSCULAR

## 2016-12-02 MED ORDER — TETANUS-DIPHTH-ACELL PERTUSSIS 5-2.5-18.5 LF-MCG/0.5 IM SUSP
INTRAMUSCULAR | Status: AC
  Filled 2016-12-02: qty 0.5

## 2016-12-02 MED ORDER — BACITRACIN ZINC 500 UNIT/GM EX OINT
TOPICAL_OINTMENT | CUTANEOUS | Status: AC
  Filled 2016-12-02: qty 18
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
## 2016-12-02 NOTE — ED Triage Notes (Signed)
Pt here for cut to left foot. sts with a knife this am. Needs tetanus

## 2017-01-11 ENCOUNTER — Ambulatory Visit (HOSPITAL_COMMUNITY)
Admission: EM | Admit: 2017-01-11 | Discharge: 2017-01-11 | Disposition: A | Discharge status: Home / Self Care | Payer: BLUE CROSS/BLUE SHIELD | Attending: Internal Medicine | Admitting: Internal Medicine

## 2017-01-11 ENCOUNTER — Encounter (HOSPITAL_COMMUNITY): Payer: Self-pay | Admitting: Family Medicine

## 2017-01-11 DIAGNOSIS — J069 Acute upper respiratory infection, unspecified: Secondary | ICD-10-CM

## 2017-01-11 MED ORDER — CETIRIZINE-PSEUDOEPHEDRINE ER 5-120 MG PO TB12: 1.0000 | ORAL_TABLET | ORAL | 0 refills | Status: DC

## 2017-01-11 MED ORDER — NAPROXEN 500 MG PO TABS: 500.0000 mg | ORAL_TABLET | Freq: Two times a day (BID) | ORAL | 0 refills | Status: AC

## 2017-01-11 NOTE — ED Provider Notes (Signed)
CSN: [id number]     Arrival date & time 01/11/17  1511 History   None    Chief Complaint  Patient presents with  . URI   (Consider location/radiation/quality/duration/timing/severity/associated sxs/prior Treatment) HPI  Runny nose and sore throat that started two days ago.  Associates HA and subjective fever. Feels that he is no better or worse. Has tried some steam from a boiling pot and this has not helped.  No medications tried.  No sick contacts.  He makes furniture in a factory.  He enjoys his work. He needs a note for work today.    History reviewed. No pertinent family history. Social History  Substance Use Topics  . Smoking status: Former Smoker    Years: 1.00    Types: Cigarettes    Quit date: 05/13/2014  . Smokeless tobacco: Never Used  . Alcohol use 1.2 - 1.8 oz/week    2 - 3 Cans of beer per week    Review of Systems  Constitutional: Negative for activity change, appetite change, chills, diaphoresis, fatigue and fever.  HENT: Positive for congestion and sore throat.   Respiratory: Positive for cough (non bloody). Negative for shortness of breath and wheezing.   Cardiovascular: Negative for chest pain and leg swelling.  Gastrointestinal: Negative for nausea.  Endocrine: Negative for polydipsia.  Skin: Negative for rash.  Neurological: Negative for dizziness and weakness.    Allergies  Patient has no known allergies.  Home Medications    Meds Ordered and Administered this Visit  Medications - No data to display  BP (!) 141/76   Pulse 73   Temp 98.4 F (36.9 C)   Resp 18   SpO2 100%  No data found.   Physical Exam  Constitutional: He is oriented to person, place, and time. He appears well-developed. He does not appear ill.  Eyes: Conjunctivae and EOM are normal. Pupils are equal, round, and reactive to light.  Cardiovascular: Normal rate, regular rhythm, normal heart sounds and intact distal pulses.   Pulmonary/Chest: Effort normal. No respiratory  distress. He has no wheezes. He has no rales.  Abdominal: He exhibits no distension.  Musculoskeletal: Normal range of motion.  Neurological: He is alert and oriented to person, place, and time. No cranial nerve deficit. Coordination normal.  Skin: Skin is warm and dry. He is not diaphoretic.  Psychiatric: He has a normal mood and affect.  Nursing note and vitals reviewed.   Urgent Care Course     Procedures (including critical care time)      MDM   1. Viral upper respiratory tract infection    Mild symptoms.  Good exam.  Will treat with nsaid and nasal decongestant.  He will come back here as needed.     Ofilia Neaslark, Kiwana Deblasi L, PA-C 01/11/17 [phone]

## 2017-01-11 NOTE — Discharge Instructions (Signed)
Okay to go back tomorrow while taking your medication as prescribed.

## 2017-01-11 NOTE — ED Triage Notes (Signed)
Pt here with URI symptoms.  

## 2017-01-16 ENCOUNTER — Emergency Department: Payer: MEDICAID

## 2017-01-16 ENCOUNTER — Encounter: Payer: Self-pay | Admitting: Emergency Medicine

## 2017-01-16 ENCOUNTER — Other Ambulatory Visit: Payer: Self-pay

## 2017-01-16 ENCOUNTER — Inpatient Hospital Stay
Admission: EM | Admit: 2017-01-16 | Discharge: 2017-01-21 | DRG: 775 | Disposition: A | Discharge status: Home / Self Care | Payer: MEDICAID | Source: Ambulatory Visit | Attending: Internal Medicine | Admitting: Internal Medicine

## 2017-01-16 ENCOUNTER — Other Ambulatory Visit: Payer: Self-pay | Admitting: Cardiology

## 2017-01-16 DIAGNOSIS — T6591XA Toxic effect of unspecified substance, accidental (unintentional), initial encounter: Secondary | ICD-10-CM | POA: Diagnosis present

## 2017-01-16 DIAGNOSIS — F10229 Alcohol dependence with intoxication, unspecified: Principal | ICD-10-CM | POA: Diagnosis present

## 2017-01-16 DIAGNOSIS — F17210 Nicotine dependence, cigarettes, uncomplicated: Secondary | ICD-10-CM | POA: Diagnosis present

## 2017-01-16 DIAGNOSIS — R000 Tachycardia, unspecified: Secondary | ICD-10-CM

## 2017-01-16 DIAGNOSIS — R0789 Other chest pain: Secondary | ICD-10-CM | POA: Diagnosis present

## 2017-01-16 DIAGNOSIS — K219 Gastro-esophageal reflux disease without esophagitis: Secondary | ICD-10-CM | POA: Diagnosis present

## 2017-01-16 DIAGNOSIS — Y906 Blood alcohol level of 120-199 mg/100 ml: Secondary | ICD-10-CM | POA: Diagnosis present

## 2017-01-16 DIAGNOSIS — Z4682 Encounter for fitting and adjustment of non-vascular catheter: Secondary | ICD-10-CM

## 2017-01-16 DIAGNOSIS — R1310 Dysphagia, unspecified: Secondary | ICD-10-CM | POA: Diagnosis not present

## 2017-01-16 DIAGNOSIS — R4182 Altered mental status, unspecified: Secondary | ICD-10-CM | POA: Diagnosis present

## 2017-01-16 HISTORY — DX: Gastro-esophageal reflux disease without esophagitis: K21.9

## 2017-01-16 HISTORY — DX: Alcohol abuse, uncomplicated: F10.10

## 2017-01-16 LAB — CBC AND DIFFERENTIAL
Baso # K/uL: 0 10*3/uL (ref 0.0–0.1)
Basophil %: 0.5 %
Eos # K/uL: 0 10*3/uL (ref 0.0–0.5)
Eosinophil %: 0.6 %
Hematocrit: 38 % — ABNORMAL LOW (ref 40–51)
Hemoglobin: 12.8 g/dL — ABNORMAL LOW (ref 13.7–17.5)
IMM Granulocytes #: 0 10*3/uL (ref 0.0–0.1)
IMM Granulocytes: 0.3 %
Lymph # K/uL: 1.9 10*3/uL (ref 1.3–3.6)
Lymphocyte %: 31 %
MCH: 31 pg/cell (ref 26–32)
MCHC: 34 g/dL (ref 32–37)
MCV: 94 fL — ABNORMAL HIGH (ref 79–92)
Mono # K/uL: 0.3 10*3/uL (ref 0.3–0.8)
Monocyte %: 4.2 %
Neut # K/uL: 4 10*3/uL (ref 1.8–5.4)
Nucl RBC # K/uL: 0 10*3/uL (ref 0.0–0.0)
Nucl RBC %: 0 /100 WBC (ref 0.0–0.2)
Platelets: 199 10*3/uL (ref 150–330)
RBC: 4.1 MIL/uL — ABNORMAL LOW (ref 4.6–6.1)
RDW: 12 % (ref 11.6–14.4)
Seg Neut %: 63.4 %
WBC: 6.2 10*3/uL (ref 4.2–9.1)

## 2017-01-16 LAB — PLASMA PROF 7 (ED ONLY)
Anion Gap,PL: 12 (ref 7–16)
CO2,Plasma: 20 mmol/L (ref 20–28)
Chloride,Plasma: 114 mmol/L — ABNORMAL HIGH (ref 96–108)
Creatinine: 0.53 mg/dL — ABNORMAL LOW (ref 0.67–1.17)
GFR,Black: 167 *
GFR,Caucasian: 145 *
Glucose,Plasma: 96 mg/dL (ref 60–99)
Potassium,Plasma: 3.8 mmol/L (ref 3.4–4.7)
Sodium,Plasma: 146 mmol/L — ABNORMAL HIGH (ref 133–145)
UN,Plasma: 5 mg/dL — ABNORMAL LOW (ref 6–20)

## 2017-01-16 LAB — SALICYLATE LEVEL: Salicylate: <2 mg/dL — ABNORMAL LOW (ref 15.0–30.0)

## 2017-01-16 LAB — CK: CK: 84 U/L (ref 46–171)

## 2017-01-16 LAB — ACETAMINOPHEN LEVEL: Acetaminophen: <5 ug/mL

## 2017-01-16 LAB — TSH: TSH: 0.87 u[IU]/mL (ref 0.27–4.20)

## 2017-01-16 MED ORDER — ROCURONIUM BROMIDE 10 MG/ML IV SOLN *WRAPPED*
50.0000 mg | Freq: Once | Status: AC
Start: 2017-01-16 — End: 2017-01-16
  Administered 2017-01-16: 50 mg via INTRAVENOUS

## 2017-01-16 MED ORDER — NALOXONE HCL 0.4 MG/ML IJ SOLN *WRAPPED*
Status: DC
Start: 2017-01-16 — End: 2017-01-16
  Filled 2017-01-16: qty 1

## 2017-01-16 MED ORDER — THIAMINE HCL 100 MG/ML IJ SOLN *I*
100.0000 mg | Freq: Every day | INTRAMUSCULAR | Status: DC
Start: 2017-01-17 — End: 2017-01-17
  Administered 2017-01-17: 100 mg via INTRAVENOUS
  Filled 2017-01-16: qty 2

## 2017-01-16 MED ORDER — FENTANYL CITRATE 20 MCG/ML IN 100ML NACL IJ SOLN *I*
25.0000 ug/h | INTRAMUSCULAR | Status: DC
Start: 2017-01-16 — End: 2017-01-17
  Administered 2017-01-16: 100 ug/h via INTRAVENOUS
  Administered 2017-01-16: 150 ug/h via INTRAVENOUS
  Administered 2017-01-17: 100 ug/h via INTRAVENOUS
  Administered 2017-01-17: 50 ug/h via INTRAVENOUS

## 2017-01-16 MED ORDER — NALOXONE HCL 0.4 MG/ML IJ SOLN *WRAPPED*
0.0400 mg | Freq: Once | Status: DC
Start: 2017-01-16 — End: 2017-01-16

## 2017-01-16 MED ORDER — MIDAZOLAM 1MG/ML IN NS *I*
0.5000 mg/h | Status: DC
Start: 2017-01-16 — End: 2017-01-17
  Administered 2017-01-16: 5 mg/h via INTRAVENOUS
  Administered 2017-01-16: 4 mg/h via INTRAVENOUS
  Administered 2017-01-17: 3 mg/h via INTRAVENOUS
  Administered 2017-01-17: 1 mg/h via INTRAVENOUS

## 2017-01-16 MED ORDER — NALOXONE HCL 0.4 MG/ML IJ SOLN *WRAPPED*
Status: DC
Start: 2017-01-16 — End: 2017-01-16
  Administered 2017-01-16: 0.04 mg via INTRAVENOUS
  Filled 2017-01-16: qty 1

## 2017-01-16 MED ORDER — SODIUM CHLORIDE 0.9 % IV BOLUS *I*
1000.0000 mL | Freq: Once | Status: AC
Start: 2017-01-16 — End: 2017-01-16
  Administered 2017-01-16: 1000 mL via INTRAVENOUS

## 2017-01-16 MED ORDER — PROPOFOL 10 MG/ML IV EMUL (INTERMITTENT DOSING) WRAPPED *I*
10.0000 mg | INTRAVENOUS | Status: DC | PRN
Start: 2017-01-16 — End: 2017-01-17
  Administered 2017-01-17: 10 mg via INTRAVENOUS
  Administered 2017-01-17 (×3): 20 mg via INTRAVENOUS

## 2017-01-16 MED ORDER — CHARCOAL ACTIVATED PO LIQD *I*
50.0000 g | Freq: Once | ORAL | Status: AC
Start: 2017-01-16 — End: 2017-01-16

## 2017-01-16 MED ORDER — CHARCOAL ACTIVATED PO LIQD *I*
ORAL | Status: AC
Start: 2017-01-16 — End: 2017-01-16
  Administered 2017-01-16: 50 g via ORAL
  Filled 2017-01-16: qty 240

## 2017-01-16 MED ORDER — MIDAZOLAM BOLUS FROM 1 MG/ML INFUSION *I*
0.5000 mg | INTRAMUSCULAR | Status: DC | PRN
Start: 2017-01-16 — End: 2017-01-17
  Administered 2017-01-16: 4 mg via INTRAVENOUS

## 2017-01-16 MED ORDER — PROPOFOL INFUSION 10 MG/ML *I*
0.0000 ug/kg/min | INTRAVENOUS | Status: DC
Start: 2017-01-17 — End: 2017-01-17
  Administered 2017-01-17: 50 ug/kg/min via INTRAVENOUS
  Administered 2017-01-17 (×2): 25 ug/kg/min via INTRAVENOUS
  Administered 2017-01-17: 30 ug/kg/min via INTRAVENOUS
  Administered 2017-01-17: 40 ug/kg/min via INTRAVENOUS
  Administered 2017-01-17: 40.08 ug/kg/min via INTRAVENOUS
  Administered 2017-01-17: 20 ug/kg/min via INTRAVENOUS
  Administered 2017-01-17: 50.1 ug/kg/min via INTRAVENOUS
  Administered 2017-01-17: 50 ug/kg/min via INTRAVENOUS
  Administered 2017-01-17: 25.05 ug/kg/min via INTRAVENOUS
  Administered 2017-01-17: 35 ug/kg/min via INTRAVENOUS
  Filled 2017-01-16: qty 200

## 2017-01-16 MED ORDER — MIDAZOLAM 1MG/ML IN NS *I*
Status: DC
Start: 2017-01-16 — End: 2017-01-16
  Administered 2017-01-16: 4 mg via INTRAVENOUS
  Filled 2017-01-16: qty 100

## 2017-01-16 MED ORDER — AMMONIA AROMATIC IN INHA *I*
RESPIRATORY_TRACT | Status: DC
Start: 2017-01-16 — End: 2017-01-16
  Filled 2017-01-16: qty 0.33

## 2017-01-16 MED ORDER — FOLIC ACID 5 MG/ML IJ SOLN *I*
1.0000 mg | Freq: Every day | INTRAMUSCULAR | Status: DC
Start: 2017-01-17 — End: 2017-01-17
  Administered 2017-01-17: 1 mg via INTRAVENOUS
  Filled 2017-01-16: qty 0.2

## 2017-01-16 MED ORDER — FENTANYL CITRATE 20 MCG/ML BOLUS FROM BAG *I*
25.0000 ug | INTRAMUSCULAR | Status: DC | PRN
Start: 2017-01-16 — End: 2017-01-17
  Administered 2017-01-16: 100 ug via INTRAVENOUS

## 2017-01-16 MED ORDER — FENTANYL CITRATE 20 MCG/ML IN 100ML NACL IJ SOLN *I*
INTRAMUSCULAR | Status: DC
Start: 2017-01-16 — End: 2017-01-16
  Administered 2017-01-16: 100 ug via INTRAVENOUS
  Filled 2017-01-16: qty 100

## 2017-01-16 MED ORDER — NALOXONE HCL 0.4 MG/ML IJ SOLN *WRAPPED*
0.4000 mg | Status: AC | PRN
Start: 2017-01-16 — End: 2017-01-16

## 2017-01-16 NOTE — ED Notes (Signed)
See detailed triage note, pt was not responsive to 0.4 mg IVP x 2 of Narcan, RSI requested by Attending Abner GreenspanBlyth, pt has no physical signs of trauma or injury.  Labs secured, foley placed, tox consult obtained, EKG performed, awaiting transport to CT scan, monitoring and additional information to follow.
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
## 2017-01-16 NOTE — ED Notes (Signed)
Providers evaluating pt at EMS.  Ammonia inhaler attempted to help wake pt.  PT not responsive to ammonia at EMS.  Pt moved to [facility].

## 2017-01-16 NOTE — ED Notes (Signed)
0.4 mg IVP of Narcan x 2 administered with no effect, pt has no physical signs of trauma or injury RSI requested by Attending Abner GreenspanBlyth;    20 mg of Etomidate  75 mg of Succinylcholine  15 LPM NC and NRB applied to patient  7.5 ETT passed on first attempt, no oxygen desaturation  B/L LS    Chest x-ray ordered
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
## 2017-01-16 NOTE — Progress Notes (Signed)
Respiratory Therapy  Weekly Summary Note    Date: 01/16/2017                         Time: 11:33 PM    Initial Assessment    Subjective   Patient is intubated and on a ventilator.    Objective   Patient Vitals for the past 12 hrs:   BP Temp Temp src Pulse Resp SpO2 Height Weight   01/16/17 2327 - - - - - 100 % - -   01/16/17 2310 (!) 128/94 36.7 C (98.1 F) - (!) 113 18 100 % - -   01/16/17 2245 (!) 130/98 - - 102 18 100 % - -   01/16/17 2212 - - - - - 100 % - -   01/16/17 2142 128/86 36.6 C (97.9 F) TEMPORAL 97 11 100 % 1.626 m (5\' 4" ) 49.9 kg (110 lb)       Lung Sounds:   Bilateral Breath Sounds: Diminished  Sputum Color: White  Sputum Amount: Small  Sputum Consistency: Thick     RESPIRATORY / METABOLIC / BLOOD LEVELS:         Blood Levels  Hemoglobin: (!) 12.8  Platelets: 199  Albumin: 3.8                        Ventilator Settings:   Ventilator: Servo-U  MODE: PRVC  Ventilator On: Yes  FiO2: 60 %  S RR: 18  Vt: 470 mL Comment: 8 cc/kg  PEEP: 5 cm H20  IT: 0.9 sec  Rise Time/Slope: 0.2  Sensitivity: 2  A RR: 18  PIP: 19 cm H2O  Plateau Pres: 12  MAP: 8 cmH2O  MV: 8.2 l/min  A VT: 470 mL  Vt exp: 462 mL      Other Relevant Findings:  None    Assessment  Patient is 26 year old male who [facility] and was intubated for airway protection. He is in TB #62 resting on above vent settings. He has bilateral diminished breathe sounds. VSS at this time.     Plan  Follow Dr's plan.    Delorise ShinerJamie H Sophia Cubero, RRT 11:33 PM 01/16/2017

## 2017-01-17 DIAGNOSIS — R4182 Altered mental status, unspecified: Secondary | ICD-10-CM

## 2017-01-17 LAB — EKG 12-LEAD
P: 72 deg
PR: 164 ms
QRS: 69 deg
QRSD: 98 ms
QT: 324 ms
QTc: 447 ms
Rate: 114 {beats}/min
T: 192 deg

## 2017-01-17 LAB — CBC AND DIFFERENTIAL
Baso # K/uL: 0 10*3/uL (ref 0.0–0.1)
Basophil %: 0.5 %
Eos # K/uL: 0.1 10*3/uL (ref 0.0–0.5)
Eosinophil %: 1 %
Hematocrit: 41 % (ref 40–51)
Hemoglobin: 13.8 g/dL (ref 13.7–17.5)
IMM Granulocytes #: 0 10*3/uL (ref 0.0–0.1)
IMM Granulocytes: 0.2 %
Lymph # K/uL: 1.6 10*3/uL (ref 1.3–3.6)
Lymphocyte %: 25.7 %
MCH: 31 pg/cell (ref 26–32)
MCHC: 34 g/dL (ref 32–37)
MCV: 92 fL (ref 79–92)
Mono # K/uL: 0.3 10*3/uL (ref 0.3–0.8)
Monocyte %: 4.6 %
Neut # K/uL: 4.1 10*3/uL (ref 1.8–5.4)
Nucl RBC # K/uL: 0 10*3/uL (ref 0.0–0.0)
Nucl RBC %: 0 /100 WBC (ref 0.0–0.2)
Platelets: 168 10*3/uL (ref 150–330)
RBC: 4.4 MIL/uL — ABNORMAL LOW (ref 4.6–6.1)
RDW: 12 % (ref 11.6–14.4)
Seg Neut %: 68 %
WBC: 6.1 10*3/uL (ref 4.2–9.1)

## 2017-01-17 LAB — COMPREHENSIVE METABOLIC PANEL
ALT: 21 U/L (ref 0–50)
AST: 18 U/L (ref 0–50)
Albumin: 4.3 g/dL (ref 3.5–5.2)
Alk Phos: 59 U/L (ref 40–130)
Anion Gap: 21 — ABNORMAL HIGH (ref 7–16)
Bilirubin,Total: 1.5 mg/dL — ABNORMAL HIGH (ref 0.0–1.2)
CO2: 17 mmol/L — ABNORMAL LOW (ref 20–28)
Calcium: 8 mg/dL — ABNORMAL LOW (ref 9.0–10.3)
Chloride: 107 mmol/L (ref 96–108)
Creatinine: 0.52 mg/dL — ABNORMAL LOW (ref 0.67–1.17)
GFR,Black: 168 *
GFR,Caucasian: 146 *
Glucose: 120 mg/dL — ABNORMAL HIGH (ref 60–99)
Lab: 4 mg/dL — ABNORMAL LOW (ref 6–20)
Potassium: 3.2 mmol/L — ABNORMAL LOW (ref 3.3–5.1)
Sodium: 145 mmol/L (ref 133–145)
Total Protein: 6 g/dL — ABNORMAL LOW (ref 6.3–7.7)

## 2017-01-17 LAB — BASIC METABOLIC PANEL
Anion Gap: 13 (ref 7–16)
CO2: 25 mmol/L (ref 20–28)
Calcium: 8.6 mg/dL — ABNORMAL LOW (ref 9.0–10.3)
Chloride: 100 mmol/L (ref 96–108)
Creatinine: 0.54 mg/dL — ABNORMAL LOW (ref 0.67–1.17)
GFR,Black: 166 *
GFR,Caucasian: 143 *
Glucose: 114 mg/dL — ABNORMAL HIGH (ref 60–99)
Lab: 4 mg/dL — ABNORMAL LOW (ref 6–20)
Potassium: 3.5 mmol/L (ref 3.3–5.1)
Sodium: 138 mmol/L (ref 133–145)

## 2017-01-17 LAB — DRUG SCREEN PANEL, EMERGENCY
Amphetamine,UR: NEGATIVE
Barbiturate,UR: NEGATIVE
Benzodiazepinen,UR: NEGATIVE
Cocaine/Metab,UR: NEGATIVE
Opiates,UR: NEGATIVE
Propoxyphene,UR: NEGATIVE
THC Metabolite,UR: NEGATIVE

## 2017-01-17 LAB — VENOUS BLOOD GAS
Base Excess,VENOUS: -4 mmol/L — ABNORMAL LOW (ref <=1)
Bicarbonate,VENOUS: 19 mmol/L — ABNORMAL LOW (ref 21–28)
CO2 (Calc),VENOUS: 20 mmol/L — ABNORMAL LOW (ref 22–31)
CO: 0.7 %
FO2 HB,VENOUS: 98 % — ABNORMAL HIGH (ref 63–83)
Hemoglobin: 14.4 g/dL (ref 13.7–17.5)
Methemoglobin: 0.4 % (ref 0.0–1.0)
PCO2,VENOUS: 30 mm Hg — ABNORMAL LOW (ref 40–50)
PH,VENOUS: 7.43 — ABNORMAL HIGH (ref 7.32–7.42)
PO2,VENOUS: 258 mm Hg — ABNORMAL HIGH (ref 25–43)

## 2017-01-17 LAB — ALCOHOL SCREEN
Acetone: <10 mg/dL (ref 0–9)
Ethanol: 185 mg/dL — ABNORMAL HIGH (ref 0–9)
Isopropanol: <10 mg/dL (ref 0–9)
Methanol: <10 mg/dL (ref 0–9)

## 2017-01-17 LAB — RUQ PANEL (ED ONLY)
ALT: 20 U/L (ref 0–50)
Albumin: 3.8 g/dL (ref 3.5–5.2)
Alk Phos: 48 U/L (ref 40–130)
Amylase: 18 U/L — ABNORMAL LOW (ref 28–100)
Bilirubin,Direct: <0.2 mg/dL (ref 0.0–0.3)
Bilirubin,Total: 0.8 mg/dL (ref 0.0–1.2)
Lipase: 27 U/L (ref 13–60)
Total Protein: 5.4 g/dL — ABNORMAL LOW (ref 6.3–7.7)

## 2017-01-17 LAB — DRUG SCREEN CHEMICAL DEPENDENCY, URINE
Amphetamine,UR: NEGATIVE
Benzodiazepinen,UR: NEGATIVE
Cocaine/Metab,UR: NEGATIVE
Opiates,UR: NEGATIVE
THC Metabolite,UR: NEGATIVE

## 2017-01-17 LAB — MAGNESIUM
Magnesium: 1.5 mEq/L (ref 1.3–2.1)
Magnesium: 1.7 mEq/L (ref 1.3–2.1)

## 2017-01-17 LAB — BILIRUBIN, DIRECT: Bilirubin,Direct: 0.4 mg/dL — ABNORMAL HIGH (ref 0.0–0.3)

## 2017-01-17 LAB — ETHYLENE GLYCOL: Ethylene Glycol: <10 mg/dL

## 2017-01-17 MED ORDER — FOLIC ACID 1 MG PO TABS *I*
1.0000 mg | ORAL_TABLET | Freq: Every day | ORAL | Status: DC
Start: 2017-01-18 — End: 2017-01-21
  Administered 2017-01-18 – 2017-01-21 (×4): 1 mg via ORAL
  Filled 2017-01-17 (×4): qty 1

## 2017-01-17 MED ORDER — THIAMINE HCL 100 MG PO TABS *WRAPPED*
100.0000 mg | ORAL_TABLET | Freq: Every day | ORAL | Status: DC
Start: 2017-01-18 — End: 2017-01-21
  Administered 2017-01-18 – 2017-01-21 (×4): 100 mg via ORAL
  Filled 2017-01-17 (×4): qty 1

## 2017-01-17 MED ORDER — AMMONIA AROMATIC IN INHA *I*
RESPIRATORY_TRACT | Status: DC
Start: 2017-01-17 — End: 2017-01-17
  Filled 2017-01-17: qty 0.33

## 2017-01-17 MED ORDER — POTASSIUM CHLORIDE 20 MEQ/15ML (10%) PO SOLN *I*
40.0000 meq | Freq: Once | ORAL | Status: AC
Start: 2017-01-17 — End: 2017-01-17
  Administered 2017-01-17: 40 meq via ORAL
  Filled 2017-01-17: qty 30

## 2017-01-17 MED ORDER — PROPOFOL INFUSION 10 MG/ML *I*
INTRAVENOUS | Status: AC
Start: 2017-01-17 — End: 2017-01-17
  Administered 2017-01-17: 10 ug/kg/min via INTRAVENOUS
  Filled 2017-01-17: qty 100

## 2017-01-17 MED ORDER — ENOXAPARIN SODIUM 40 MG/0.4ML IJ SOSY *I*
40.0000 mg | PREFILLED_SYRINGE | Freq: Every day | INTRAMUSCULAR | Status: DC
Start: 2017-01-17 — End: 2017-01-21
  Administered 2017-01-17 – 2017-01-20 (×3): 40 mg via SUBCUTANEOUS
  Filled 2017-01-17 (×4): qty 0.4

## 2017-01-17 MED ORDER — RANITIDINE HCL 75 MG PO TABS *I*
150.0000 mg | ORAL_TABLET | Freq: Two times a day (BID) | ORAL | Status: DC
Start: 2017-01-17 — End: 2017-01-20
  Administered 2017-01-17 – 2017-01-20 (×6): 150 mg via ORAL
  Filled 2017-01-17 (×8): qty 2

## 2017-01-17 MED ORDER — MAGNESIUM SULFATE 2 G IN NS 50 ML *I*
2000.0000 mg | Freq: Once | INTRAVENOUS | Status: AC
Start: 2017-01-17 — End: 2017-01-17
  Administered 2017-01-17: 2000 mg via INTRAVENOUS
  Filled 2017-01-17: qty 50

## 2017-01-17 MED ORDER — PLASMA-LYTE IV BOLUS *WRAPPED*
1000.0000 mL | Freq: Once | INTRAVENOUS | Status: AC
Start: 2017-01-17 — End: 2017-01-17
  Administered 2017-01-17: 1000 mL via INTRAVENOUS
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
## 2017-01-17 NOTE — Progress Notes (Signed)
Report Given To   Genia Plants Klossner, RN & Fortunato CurlingS Saks, RN      Descriptive Sentence / Reason for Admission   PMHX: alcohol abuse, GERD     ADMIT: Presented unresponsive to the ED. Per EMS, bystanders observed the pt ingesting "Molly juice" from a clear plastic bottle, shortly afterward pt became somnolent and then rapidly unresponsive. On arrival to ED, pt unarousable to physical or verbal stimulus. A plastic bottle containing approximately 20 mL of whitish-clear liquid was found near the patient. Pt noted to make occasional, nonrhythmic jerking motions of his head and bilateral upper extremities.     01/17/17, ~0600: Tx to 816. 2x 20mg  bolus of propofol, inc propofol gtt to 150mcg/kg/min      Active Issues / Relevant Events   Pt tx to [address]1600 ~0600. Extremely agitated and gagging on ETT, 2x PRN boluses of 20mg  propofol given with some noted effect. Pt continued to be agitated and attempting to sit up and pull at lines, propofol gtt increased to 50 mcg/kg/min - positive effect noted.     Pt arrived with fentanyl gtt attached to patient but pump was off, no fentanyl given - remaining fentanyl wasted with witness.     Please see flowsheets and MAR for further information.  Brett CraneKayla Osha Rane, RN        To Do List  TBD      Anticipatory Guidance / Discharge Planning  Pt is Nepali

## 2017-01-17 NOTE — Progress Notes (Signed)
Descriptive Sentence  Admission date: 01/16/17    Admitting Dx: altered Mental status, "Molly water" ingestion    PMH: ETOH, GERD      Active Issues  Critical Care Bundle [id number]    Date Intubated:01/16/17 extubated 6/12    Spontaneous Breathing Trial: y    Pain Management:: y    Sedation: propofol    Sedation Holiday: y    Delirium Management: n    Central Line Placement: n/a    Foley:01/16/17    PUD Prophylaxis: n    DVT Prophylaxis: y    Mobility: as tol    PT consult:: n    Nutrition: npo    Family Concerns/Updates:    Code Status: full      To Do List    Interdisciplinary Rounds Note    Date: 01/17/2017   Time: 10:43 AM   Attendance:  MD, Fellow, Residents, Nursing, Care Coordinator    6/12 Daily PST per protocol              Extubated this am              D/C foley              Change VS to q 4 hr              Add mag level to am labs              40 meq KCL po x 1              Stable for tx to floor              D/C NGT              Regular diet    Admit Date/Time:  01/16/2017  9:52 PM    Principal Problem: Mental status alteration  Problem List:   Patient Active Problem List    Diagnosis Date Noted    Mental status alteration 01/17/2017       The patient's problem list and interdisciplinary care plan was reviewed.    Discharge Planning                                              Plan    Anticipated Discharge Date:     Discharge Disposition: Home      Anticipatory Guidance

## 2017-01-17 NOTE — Consults (Addendum)
Medical Toxicology Consult  Brief note     Patient is 26 year-old M with ingestion of alcohol and "molly water,"  Brought in unresponsive with myoclonus reported.  Intubated for airway protection.  Apparently given AC in ED.  Had been supported overnight, extubated this AM.  Is withdrawn, not participating in history/exam (somewhat) but this seems to be volitional.  Is Nepali speaking.      "Molly water"  Potentially same as "liquid ecstacy"  Which is slang for GHB (gamma hydroxy butyrate).  This would fit the toxidrome.  Other possibilities include sedative not detected on tox screen (e.g. Gabapentin or pregabalin or baclofen or one of the other GABAergic non-benzodiazepine medications such as zolpidem or other Z drug).     Its possible this also was just ethanol and he has very low tolerance.  Only other finding I see in las is an anion gap of 21 with bicarbonate of 17.  He is not behaving as if he's significantly acidotic at this point (and pH on ABG 7.43 last night).      At this point can d/c telemetry and would confirm walking without ataxia, taking PO,  I would expect he could be cleared tomorrow.  Further interview with translator phone tomorrow when he can participate (or is interviewed with translator/family or volunteers more [place]).      Consider sending urine GHB level (send out) or urine drug panel (expanded) which would pick up many (but not all) of the sedatives I mention above).  At this point primarily would be forensic utility.    Call if ?s full note to follow [phone].    Seen on 01/17/2017 at 13:45

## 2017-01-17 NOTE — Progress Notes (Signed)
Post extubation

## 2017-01-17 NOTE — Progress Notes (Signed)
Report Given To  Amy [id number] RN      Descriptive Sentence / Reason for Admission   PMHX: alcohol abuse, GERD     ADMIT: Presented unresponsive to the ED. Per EMS, bystanders observed the pt ingesting "Molly juice" from a clear plastic bottle, shortly afterward pt became somnolent and then rapidly unresponsive. On arrival to ED, pt unarousable to physical or verbal stimulus. A plastic bottle containing approximately 20 mL of whitish-clear liquid was found near the patient. Pt noted to make occasional, nonrhythmic jerking motions of his head and bilateral upper extremities.     01/17/17, ~0600: Tx to 816. 2x 20mg  bolus of propofol, inc propofol gtt to 7850mcg/kg/min  6/12 D:Extubated to NC, D/c propofol,foley and NG, diet started.       Active Issues / Relevant Events   PULM: Extubated around 0800 to NC and tolerating well. Pt is on 2 L NC with no desaturations.   CVS: Pt has been NSR/ST with no ectopy. NBP WNL  RENAL: Foley D/Cd around 12. Urinal at bedside but pt has not urinated yet. K 3.2 replaced with 40MEQ and Mg 1.5 replaced with 2g.   GI: Pt started on regular diet and tolerating well.   ID: Afebrile, WBC 6.1  HEME: Lovenox for DVT prophylaxis. H&H stable 13.8 & 41  NEURO: Pt can be lethargic but able to wake up and follow commands. No complaints of pain. Pt states he ingested insect repellent on accident.   SKIN/MOBILITY: Skin grossly intact, turn and reposition Q2hrs. Pt was able to walk from bed to chair with assistance.     For more information please refer to Doc Flowsheets, Active orders, MAR and labs Sallye Lataylor Klossner, RN            To Do List  VS Q4hrs   Daily Labs (CBC w/ diff, CMP)  Regular diet     Transfer to floor [id number] Room 25B      Anticipatory Guidance / Discharge Planning  Pt is Nepali

## 2017-01-17 NOTE — Progress Notes (Signed)
Patient extubated to 4l NC . Patient had positive cuff leak. Patient has no audible stridor at this time. Patient has strong cough and positive vocalization. Patients SpO2 100%.

## 2017-01-17 NOTE — Plan of Care (Incomplete)
Problem: Safety  Goal: Patient will remain free of falls  Outcome: Maintaining  4/4 side rails in use along with

## 2017-01-17 NOTE — Progress Notes (Signed)
Pt arrived to unit from 816 via wheelchair accompanied by RN from 816 and family member.  Pt assisted to bed by one person, tolerated well.  4 eyed skin assessment completed with Deliah GoodyLezlie, RN with no current skin issues noted.  Writer noted several past wounds on left wrist and arm, all healed and scarred.  Pt belongings at bedside include clothing and smart phone.  Pt does not report having any other valuables.  Pt settled to room by Clinical research associatewriter, call bell and phone provided.  Pt instructed to call for assistance if needed. Arleta CreekAmy Daeron Carreno, RN

## 2017-01-17 NOTE — ED Provider Notes (Addendum)
History     Chief Complaint   Patient presents with    Unresponsive     HPI Comments: The patient is a 26 year old male with past medical history alcohol abuse, GERD presenting unresponsive to the emergency department.  Per EMS, they were called by bystanders to observe the patient ingesting "Molly juice" from a clear plastic bottle, shortly afterward the patient became somnolent and then rapidly unresponsive.  At time of EMS arrival, the patient did not arouse to physical or verbal stimulus.  A plastic bottle containing approximately 20 mL of whitish-clear liquid was found near the patient.  At time of arrival, the patient is noted to make occasional, nonrhythmic jerking motions of his head and bilateral upper extremities that is not sustained for more than one beat, but does not respond to verbal or physical stimulation.  At time of arrival the patient is also noted to have a small amount of brown emesis around his mouth.  Per EMS, no other illicit substances were found on the scene, and bystanders were unable to offer any other history.      History provided by:  EMS personnel  History limited by:  Mental status change and patient unresponsive  Language interpreter used: No      Past Medical History:   Diagnosis Date    Alcohol abuse     GERD (gastroesophageal reflux disease)         No past surgical history on file.  History reviewed. No pertinent family history.    Social History    has no tobacco, alcohol, drug, and sexual activity history on file.    Living Situation     Questions Responses    Patient lives with     Homeless     Caregiver for other family member     External Services     Employment     Domestic Violence Risk           Problem List     Patient Active Problem List   Diagnosis Code    Mental status alteration R41.82       Review of Systems   Review of Systems   Unable to perform ROS: Patient unresponsive       Physical Exam     ED Triage Vitals   BP Heart Rate Heart Rate (via Pulse Ox)  Resp Temp Temp src SpO2 (Retired) O2 Device O2 Flow Rate   01/16/17 2142 01/16/17 2142 01/16/17 2245 01/16/17 2142 01/16/17 2142 01/16/17 2142 01/16/17 2142 -- --   128/86 97 103 11 36.6 C (97.9 F) TEMPORAL 100 %        Weight           01/16/17 2142           49.9 kg (110 lb)                    Physical Exam   Constitutional: He appears well-developed and well-nourished. He appears lethargic.  Non-toxic appearance. He does not have a sickly appearance. He appears ill. No distress. Nasal cannula in place.   HENT:   Head: Head is without raccoon's eyes, without Battle's sign, without abrasion and without contusion.   Right Ear: Tympanic membrane normal. No hemotympanum.   Left Ear: Tympanic membrane normal. No hemotympanum.   Mouth/Throat: Mucous membranes are normal. Mucous membranes are not pale, not dry and not cyanotic.   Eyes: Conjunctivae and EOM are normal. Pupils are equal, round, and reactive  to light. Right conjunctiva is not injected. Right conjunctiva has no hemorrhage. Left conjunctiva is not injected. Left conjunctiva has no hemorrhage. Right eye exhibits normal extraocular motion and no nystagmus. Left eye exhibits normal extraocular motion and no nystagmus. Right pupil is round and reactive. Left pupil is round and reactive.   roving eye movements, will occasionally track to voice  No nystagmus   pupils 5 mm and equal, reactive bilaterally   Neck: No rigidity.   Cardiovascular: Normal rate and regular rhythm.   No extrasystoles are present. PMI is not displaced.  Exam reveals no distant heart sounds, no friction rub and no decreased pulses.       No systolic murmur is present    No diastolic murmur is present   Pulses:       Radial pulses are 2+ on the right side, and 2+ on the left side.        Dorsalis pedis pulses are 2+ on the right side, and 2+ on the left side.   Pulmonary/Chest: No tachypnea and no bradypnea. He has no decreased breath sounds. He has no wheezes. He has no rhonchi. He has no  rales.   Abdominal: Normal appearance and bowel sounds are normal. He exhibits no distension and no ascites.   no bruising, abrasions, lacerations, lacerations, or other trauma noted to the abdomen   Musculoskeletal:        Right shoulder: He exhibits no deformity.        Left shoulder: He exhibits no deformity.        Right elbow: He exhibits no deformity.        Left elbow: He exhibits no deformity.        Right wrist: He exhibits no deformity.        Left wrist: He exhibits no deformity.        Right hip: He exhibits no deformity.        Left hip: He exhibits no deformity.        Right knee: He exhibits no deformity.        Left knee: He exhibits no deformity.        Cervical back: He exhibits no deformity and no laceration.        Thoracic back: He exhibits no deformity and no laceration.        Lumbar back: He exhibits no deformity and no laceration.   Neurological: He appears lethargic. GCS eye subscore is 1. GCS verbal subscore is 1. GCS motor subscore is 1.   Reflex Scores:       Patellar reflexes are 3+ on the right side and 3+ on the left side.  No clonus of the upper or lower extremities   Skin: Skin is intact. No abrasion and no burn noted.   Armpits moist       Medical Decision Making      Amount and/or Complexity of Data Reviewed  Clinical lab tests: ordered and reviewed  Tests in the radiology section of CPT: ordered and reviewed        Initial Evaluation:  ED First Provider Contact     Date/Time Event User Comments    01/16/17 2155 ED First Provider Contact ROSS, BRYAN Initial Face to Face Provider Contact          Patient seen by me as above    Assessment:  26 y.o.male comes to the ED with acute altered mental status in the setting of ingestion of an unknown  substance.  At time of evaluation, the patient is a GCS of 3 and does not respond to noxious stimuli.  Per the patient's presumed emesis in and around his mouth his inability to protect his airway, the patient was intubated out of concern for  his aspiration risk and risk of respiratory decompensation.  On examination, the patient has no overt evidence of traumatic injury, however must consider intracranial hemorrhage.  The patient has positive bowel sounds and moist mucous membranes, making an anticholinergic toxidrome unlikely; additionally the patient is normotensive and not tachycardic and does not have extremity rigidity on exam, suggesting against increased serotonergic tone.  This is most consistent with a sedative hypnotic toxidrome (unlikely to be opiates as the patient has normal, reactive pupils and a normal respiratory rate) however must consider a metabolic or traumatic source of his decreased mental status.    Differential Diagnosis includes ethanol intoxication, toxic alcohol ingestion, acetaminophen overdose, salicylate overdose, benzodiazepine overdose, sedative hypnotic toxidrome, opiate overdose (unlikely) intercranial hemorrhage, traumatic brain injury, metabolic encephalopathy, rhabdomyolysis, electrolyte abnormality                      Plan: 1.  CBC, Chem-7, CK, right upper quadrant panel, ethanol level, toxic alcohol panel, acetaminophen/salicylate level, urine toxicology panel, TSH  2.  EKG  3.  Chest x-ray  4. CT head without contrast  5.  IV fluids  6.  Patient intubated using succinylcholine and etomidate for airway protection, 7.5 ET tube placed under direct laryngoscopy.  7.  ICU consultation, patient will require higher level of care following intubation  Willis Modena, MD           Willis Modena, MD  Resident  01/17/17 [phone]    Resident Attestation:     Patient seen by me on arrival date of 01/16/2017 at the time of arrival 9:41 PM    History:   I reviewed this patient, reviewed the resident's note and agree.  Exam:   I examined this patient, reviewed the resident's note and agree.    Decision Making:   I discussed with the resident his/her documented decision making  and agree.      Critical Care    There is a high  probability of imminent or life threatening deterioration due to  toxidrome    This is secondary to intoxication    Acute interventions include IV medications including succinylcholine, ventilator management, examination of patient, ordering and performing treatments and interventions, ordering and review of laboratory studies, ordering and review of radiographic studies, evaluation of the patient's response to treatment and discussion with consultants    Exact time of critical care (exclusive of other billable procedures)  40 minutes        Author Elliot Gault, MD       Elliot Gault, MD  01/19/17 [phone]

## 2017-01-17 NOTE — ED Procedure Documentation (Addendum)
Procedures   Intubation  Date/Time: 01/16/2017 10:10 PM  Performed by: Dan Europe  Authorized by: Lonna Duval     Consent:     Consent obtained:  Emergent situation  Universal protocol:     Patient identity confirmed:  Arm band  Indications:     Indications:  Airway protection  Pre-procedure details:     Patient status:  Unresponsive    Pretreatment medications:  None    Sedation:  Etomidate    Paralytics:  Succinylcholine  Procedure details:     Preoxygenation:  Nonrebreather mask    CPR in progress: no      Intubation method:  Oral    Oral intubation technique:  Direct    Laryngoscope blade:  Mac 3    Tube size (mm):  7.5    Tube type:  Cuffed    Number of attempts:  1    Ventilation between attempts: no      Cricoid pressure: no      Tube visualized through cords: yes      Laryngoscopy View:  Grade I  Placement assessment:     ETT to lip:  21    Tube secured with:  ETT holder    Breath sounds:  Equal and absent over the epigastrium    Placement verification: chest rise, condensation, CXR verification, direct visualization, equal breath sounds and ETCO2 detector      CXR findings:  ETT in proper place  Post-procedure details:     Patient tolerance of procedure:  Tolerated well, no immediate complications          Dan Europe, MD     Dan Europe, MD  Resident  01/17/17 0159    I was present and participated during the entire procedure.    Lonna Duval, MD       Lonna Duval, MD  01/19/17 [phone]

## 2017-01-17 NOTE — Progress Notes (Addendum)
PULMONARY/CRITICAL CARE ATTENDING:     This patient was evaluated on rounds with the resident physician/fellow/NP.  I personally examined the patient.  I agree with the database, findings, and plan of care recorded in the resident physician/fellow/NP note. Please refer to it for details. Any revisions by me would be noted below.       LOS: 0 days     HISTORY:  Patient is a 26 year old male with known history of EtOH abuse.    He was found unresponsive by bystanders.  He had a water bottle with him perhaps with an unknown substance dissolved within.  Did not respond to IV naloxone or inhaled ammonia.    Intubated emergency department for airway protection.  GCS less than 8.    Initial evaluation demonstrates normal arterial blood gas; normal basic metabolic profile save for mild increase in sodium; normal complete blood count; blood alcohol level of 185 mg percent; negative urine toxicology screen.    Head CT:  "Unremarkable".    PX:  Intubated/sedated  Blood pressure (!) 119/92, pulse 93, temperature 36.6 C (97.9 F), temperature source Temporal, resp. rate (!) 25, height 1.626 m (5\' 4" ), weight 49.9 kg (110 lb), SpO2 100 %.  Chest: CTA  Cardiac: RRR  ABD: Soft    DATA:  Reviewed.     IMPRESSION / PLAN: Encephalopathy and respiratory failure from alcohol and perhaps MDMA.  Now extubated. No evidence of metabolic toxicity presently. Hemodynamically stable with clear.  OK for continued observation on floor.    Addendum: Nursing has apprised me that the patient claims to have accidentally ingested "bed bug killer".  I spoke with the patient regarding this. He adamantly denies suicidal ideation and reiterates that he thought it was water.  So far, we have seen little evidence of organophosphate toxicity to corroborate his story.          Critical Care Time: 45 minutes.  Ardelia Memsavid R Ramon Brant MD PhD  Associate Professor of Medicine  Division of Pulmonary/Critical Care Medicine  This patient is critically ill with at least 1  organ system failure associated with a high probability of imminent or life threatening deterioration.  The care I have delivered involved high complexity decision making to assess, manipulate, and support vital system function(s), to treat the vital organ system failure and/or prevent further life threatening deterioration of the patient's condition.  All nursing documentation, laboratory data, test results, and radiographs were reviewed and interpreted by me.  I have established the management plan for this patient's critical illness and have been immediately available to assist with patient care.  My documented total critical care time reflects my own patient care time and does not include teaching or procedure time.

## 2017-01-17 NOTE — H&P (Addendum)
Toxicology H&P/Consult    Requesting Physician: Almeta Monas, MD  Reason for Consult: AMS in setting of unknown liquid ingestion      HPI: Brett Murphy is a 26 y.o. [place] male with PMH of ethanol abuse, GERD who presented to the ED unresponsive in setting of drinking unknown liquid.  Per ED note, patient ingested "Molly juice," from a clear plastic bottle, which caused the patient to become somnolent and then unresponsive.  96m of whitish clear liquid found next to patient.  Presented to ED minimally responsive.  Intubated in ED for airway protection. n given Narcan 0.435mX2 without affect.   CT head unremarkable.  ETOH level 185, ASA/APAP/opiates/cocaine negative. VBG 7.43/20/158/19. Extubated on 6/12 to 4L NC.    Patient unable to provide history. Extubated this morning but still very somnolent on exam.  Family out of room but were present during extubation.  Unclear if patient is primarily [place]; unable to answer questions due to altered mental status.       Patient Active Problem List    Diagnosis Date Noted    Mental status alteration 01/17/2017     Past Medical History:   Diagnosis Date    Alcohol abuse     GERD (gastroesophageal reflux disease)       No past surgical history on file.   No prescriptions prior to admission.     Not on File   Social History   Substance Use Topics    Smoking status: Not on file    Smokeless tobacco: Not on file    Alcohol use Not on file      History reviewed. No pertinent family history.     Review of Systems:  Review of Systems   Unable to perform ROS: Mental status change       No other pertinent positives or negatives, complete review of systems performed.    Scheduled Meds:   enoxaparin  40 mg Subcutaneous Daily    thiamine  100 mg Intravenous Daily    folic acid  1 mg Intravenous Daily     Continuous Infusions:  PRN Meds:  Outpatient Meds:  No current outpatient prescriptions on file.         I-Stop Record: 0 prescriptions.          Objective:    Vital signs in last 24 hours:  Temp:  [36.6 C (97.9 F)-37.6 C (99.7 F)] 36.6 C (97.9 F)  Heart Rate:  [78-113] 92  Resp:  [11-33] 17  BP: (93-133)/(66-98) 127/77  FiO2:  [40 %-60 %] 40 %    Intake/Output last 3 shifts:  I/O last 3 completed shifts:  06/11 0700 - 06/12 0659  In: 2074.4 (41.6 mL/kg) [I.V.:1074.4 (0.9 mL/kg/hr); IV Piggyback:1000]  Out: 985 (19.7 mL/kg) [Urine:985 (0.8 mL/kg/hr)]  Net: 1089.4  Weight: 49.9 kg   Intake/Output this shift:  I/O this shift:  06/12 0700 - 06/12 1459  In: 14.8 (0.3 mL/kg) [I.V.:14.8]  Out: 300 (6 mL/kg) [Urine:300]  Net: -285.3  Weight: 49.9 kg     Vent settings for last 24 hours:  Ventilator: Drager [id number]  MODE: SPN-CPAP/PS Comment: found on  Ventilator On: Yes  FiO2: 40 %  AutoFlow: On  S RR: 16  Vt: 470 mL  PEEP: 5 cm H20  PS: 5 cm H2O  IT: 0.9 sec  Rise Time/Slope: 0.2  Sensitivity: 2  A RR: 23  PIP: 10 cm H2O  Plateau Pres: 12  MAP: 6.8 cmH2O  MV: 4.92 l/min  A VT: 476 mL  Vt exp: 481 mL  Vt Spon Insp: 194 mL  A VT: 161 mL    Hemodynamic parameters for last 24 hours:       Physical Exam   Constitutional: He appears well-developed and well-nourished. No distress. He is not intubated.   Patient somnolent and not able to answer questions.    HENT:   Head: Normocephalic and atraumatic.   Nose: Nose normal.   Mouth/Throat: Oropharynx is clear and moist.   On NC   Eyes: Conjunctivae and EOM are normal. Pupils are equal, round, and reactive to light.   Neck: Normal range of motion. Neck supple.   Cardiovascular: Normal rate, regular rhythm, normal heart sounds and intact distal pulses.    Pulmonary/Chest: Effort normal and breath sounds normal. He is not intubated. He has no decreased breath sounds. He has no wheezes. He has no rhonchi. He has no rales.   Abdominal: Soft. Bowel sounds are normal. He exhibits no distension. There is no tenderness.   Lymphadenopathy:     He has no cervical adenopathy.   Neurological:   Not alert or oriented.  Somnolent.  Moving upper extremities. Not following commands.   Skin: Skin is warm and dry. No rash noted.   Nursing note and vitals reviewed.      ECG Interpretation: sinus tachycardia  Rate: 114   QTc: 447   QRS: 98    Recent Results (from the past 48 hour(s))   CBC and differential    Collection Time: 01/16/17 10:35 PM   Result Value Ref Range    WBC 6.2 4.2 - 9.1 THOU/uL    RBC 4.1 (L) 4.6 - 6.1 MIL/uL    Hemoglobin 12.8 (L) 13.7 - 17.5 g/dL    Hematocrit 38 (L) 40 - 51 %    MCV 94 (H) 79 - 92 fL    MCH 31 26 - 32 pg/cell    MCHC 34 32 - 37 g/dL    RDW 12.0 11.6 - 14.4 %    Platelets 199 150 - 330 THOU/uL    Seg Neut % 63.4 %    Lymphocyte % 31.0 %    Monocyte % 4.2 %    Eosinophil % 0.6 %    Basophil % 0.5 %    Neut # K/uL 4.0 1.8 - 5.4 THOU/uL    Lymph # K/uL 1.9 1.3 - 3.6 THOU/uL    Mono # K/uL 0.3 0.3 - 0.8 THOU/uL    Eos # K/uL 0.0 0.0 - 0.5 THOU/uL    Baso # K/uL 0.0 0.0 - 0.1 THOU/uL    Nucl RBC % 0.0 0.0 - 0.2 /100 WBC    Nucl RBC # K/uL 0.0 0.0 - 0.0 THOU/uL    IMM Granulocytes # 0.0 0.0 - 0.1 THOU/uL    IMM Granulocytes 0.3 %   Plasma profile 7 (Adult ED only)    Collection Time: 01/16/17 10:35 PM   Result Value Ref Range    Chloride,Plasma 114 (H) 96 - 108 mmol/L    CO2,Plasma 20 20 - 28 mmol/L    Potassium,Plasma 3.8 3.4 - 4.7 mmol/L    Sodium,Plasma 146 (H) 133 - 145 mmol/L    Anion Gap,PL 12 7 - 16    UN,Plasma 5 (L) 6 - 20 mg/dL    Creatinine 0.53 (L) 0.67 - 1.17 mg/dL    GFR,Caucasian 145 *    GFR,Black 167 *    Glucose,Plasma 96  60 - 99 mg/dL   CK    Collection Time: 01/16/17 10:38 PM   Result Value Ref Range    CK 84 46 - 272 U/L   Salicylate level    Collection Time: 01/16/17 10:38 PM   Result Value Ref Range    Salicylate <5.3 (L) 66.4 - 30.0 mg/dL   Acetaminophen level    Collection Time: 01/16/17 10:38 PM   Result Value Ref Range    Acetaminophen <5 ug/mL   TSH    Collection Time: 01/16/17 10:38 PM   Result Value Ref Range    TSH 0.87 0.27 - 4.20 uIU/mL   Drug screen chemical dependency, urine     Collection Time: 01/16/17 10:38 PM   Result Value Ref Range    Amphetamine,UR NEG     Cocaine/Metab,UR NEG     Benzodiazepinen,UR NEG     Opiates,UR NEG     THC Metabolite,UR NEG    Drug screen panel, urine    Collection Time: 01/16/17 10:38 PM   Result Value Ref Range    Amphetamine,UR NEG     Barbiturate,UR NEG     Cocaine/Metab,UR NEG     Benzodiazepinen,UR NEG     Opiates,UR NEG     Propoxyphene,UR NEG     THC Metabolite,UR NEG    RUQ panel (ED only)    Collection Time: 01/16/17 10:38 PM   Result Value Ref Range    Amylase 18 (L) 28 - 100 U/L    Lipase 27 13 - 60 U/L    Total Protein 5.4 (L) 6.3 - 7.7 g/dL    Albumin 3.8 3.5 - 5.2 g/dL    Bilirubin,Total 0.8 0.0 - 1.2 mg/dL    Bilirubin,Direct <0.2 0.0 - 0.3 mg/dL    Alk Phos 48 40 - 130 U/L    AST CANCELED 0 - 50 U/L    ALT 20 0 - 50 U/L   Alcohol screen (Incl: Isopropanol, Ethanol, Methanol, Acetone)    Collection Time: 01/16/17 10:38 PM   Result Value Ref Range    Ethanol 185 (H) 0 - 9 mg/dL    Methanol <10 0 - 9 mg/dL    Isopropanol <10 0 - 9 mg/dL    Acetone <10 0 - 9 mg/dL   Ethylene glycol    Collection Time: 01/16/17 10:38 PM   Result Value Ref Range    Ethylene Glycol <10 mg/dL   Venous blood gas    Collection Time: 01/17/17  1:08 AM   Result Value Ref Range    Hemoglobin 14.4 13.7 - 17.5 g/dL    PH,VENOUS 7.43 (H) 7.32 - 7.42    PCO2,VENOUS 30 (L) 40 - 50 mm Hg    PO2,VENOUS 258 (H) 25 - 43 mm Hg    Bicarbonate,VENOUS 19 (L) 21 - 28 mmol/L    Base Excess,VENOUS -4 (L) -3 - 1 mmol/L    CO2 (Calc),VENOUS 20 (L) 22 - 31 mmol/L    FO2 HB,VENOUS 98 (H) 63 - 83 %    CO 0.7 %    Methemoglobin 0.4 0.0 - 1.0 %   CBC and differential    Collection Time: 01/17/17  1:08 AM   Result Value Ref Range    WBC 6.1 4.2 - 9.1 THOU/uL    RBC 4.4 (L) 4.6 - 6.1 MIL/uL    Hemoglobin 13.8 13.7 - 17.5 g/dL    Hematocrit 41 40 - 51 %    MCV 92 79 - 92 fL  MCH 31 26 - 32 pg/cell    MCHC 34 32 - 37 g/dL    RDW 12.0 11.6 - 14.4 %    Platelets 168 150 - 330 THOU/uL    Seg Neut %  68.0 %    Lymphocyte % 25.7 %    Monocyte % 4.6 %    Eosinophil % 1.0 %    Basophil % 0.5 %    Neut # K/uL 4.1 1.8 - 5.4 THOU/uL    Lymph # K/uL 1.6 1.3 - 3.6 THOU/uL    Mono # K/uL 0.3 0.3 - 0.8 THOU/uL    Eos # K/uL 0.1 0.0 - 0.5 THOU/uL    Baso # K/uL 0.0 0.0 - 0.1 THOU/uL    Nucl RBC % 0.0 0.0 - 0.2 /100 WBC    Nucl RBC # K/uL 0.0 0.0 - 0.0 THOU/uL    IMM Granulocytes # 0.0 0.0 - 0.1 THOU/uL    IMM Granulocytes 0.2 %   Comprehensive metabolic panel    Collection Time: 01/17/17  1:08 AM   Result Value Ref Range    Sodium 145 133 - 145 mmol/L    Potassium 3.2 (L) 3.3 - 5.1 mmol/L    Chloride 107 96 - 108 mmol/L    CO2 17 (L) 20 - 28 mmol/L    Anion Gap 21 (H) 7 - 16    UN 4 (L) 6 - 20 mg/dL    Creatinine 0.52 (L) 0.67 - 1.17 mg/dL    GFR,Caucasian 146 *    GFR,Black 168 *    Glucose 120 (H) 60 - 99 mg/dL    Calcium 8.0 (L) 9.0 - 10.3 mg/dL    Total Protein 6.0 (L) 6.3 - 7.7 g/dL    Albumin 4.3 3.5 - 5.2 g/dL    Bilirubin,Total 1.5 (H) 0.0 - 1.2 mg/dL    AST 18 0 - 50 U/L    ALT 21 0 - 50 U/L    Alk Phos 59 40 - 130 U/L     *Note: Due to a large number of results and/or encounters for the requested time period, some results have not been displayed. A complete set of results can be found in Results Review.           Assessment/Plan:  Patient Active Problem List   Diagnosis Code    Mental status alteration R41.82       Brett Murphy is a 26 y.o. male with PMH of ETOH abuse and GERD who presents to ED unresponsive in setting of ingesting unknown liquid.  Patient intubated for airway protection.  ETOH 185.  Naloxone administered without effect. Extubated on 6/12.  Patient still somnolent. Patient unable to provide substance ingested. Clinically stable at this time.  Due to clinical presentation, GHB is a potential source which would have a cumulative effect with alcohol causing patient's altered state.     Recommendations:  - Will follow up comprehensive urine panel  - GHB urine sent  - Will continue to follow  - Rest of  care per primary team    Please Web page toxicology attending as needed for questions.    Gladstone Pih, MD  01/17/2017 10:21 AM    Gladstone Pih, MD  Resident  01/17/17 [phone]    Medical Toxicology Faculty Note Dr. Payton Doughty attending  Patient seen and assessed and discussed with [facility] resident and I have reviewed the above note and agree with the history, exam, assessment and plan.  All  data and labs reviewed and I have independently evaluated the patient.      Patient with ingestion of alcohol and ? "Liquid Molly" vs other I've heard different stories on what he might have ingested.  He is not having clinical effects c/w pesticide whether perethryn or nicotinic agent.  He could have simply ingested EtOH (with low tolerance). Seems to be volitional component of interview and w/d from interview can send from ICU and keep in [facility].  Will f/u again tomorrow potential interview with translator phone needed (was withdrawn and tired declined earlier).  Odd presentation for self harm event from what I've been told (or inadvertent from drinking heavily and getting confused.     Seen on 01/17/2017 at 13:35 x 66 minutes and additional > 15 min on unit reviewing notes, data, labs and other information prior to bedside consultation.

## 2017-01-17 NOTE — Plan of Care (Signed)
Problem: Safety  Goal: Patient will remain free of falls  Outcome: Maintaining  4/4 side rails in use along with hourly rounding. Pt has call bell to use for any needs.     Problem: Nutrition  Goal: Patient's nutritional status is maintained or improved  Outcome: Progressing towards goal  Pt started on regular diet and tolerating well.     Problem: Mobility  Goal: Patient's functional status is maintained or improved  Outcome: Maintaining  Pt turned and repositioned Q2hrs, pt was able to walk to chair with assistance.

## 2017-01-17 NOTE — Interim Hospital Course Summary (Addendum)
Interim Summary for long stay patient    Hospital Problem List:  ACTIVE    Diagnosis Date Noted    *!*Mental status alteration [R41.82] 01/17/2017      RESOLVED    Diagnosis Date Noted Date Resolved   No resolved problems to display.       HospCourse   Mr. Brett Murphy is a 26 y.o. male with a history of GERD and alcohol abuse who presented with altered mental status and required intubation for airway protection. Bystander report of ingestion of unknown liquid, per ED provider report this was called "molly juice". Toxicology was consulted and through discussions between primary team, toxicology, and patient appears he ingested significant amounts of alcohol and either initially reported 'molly juice' which can be slang for GHB vs patient reported accidental consumption of bed bug juice (?Organophosphate poisoning) while intoxicated. Patient adamantly denies suicide attempt and endorses a strong support network between his family and friends who did visit him in the ICU. His presenting toxidrome seemed to fit that of GHB and EtOH or copious EtOH alone. By the morning after his admission, his toxidrome appears to have started to clear and he was extubated (01/17/17). His vitals have largely normalized and he was felt to be stable for the floor. Anticipate clearance of remainder of his toxidrome over the next 24hrs.      Do NOT erase these  green markers that allow the Discharge Summary to pull in this Hospital Course data.    First Signed: Tacey HeapNicholas Everette Dimauro, MD  On: 01/17/2017  at: 3:58 PM

## 2017-01-17 NOTE — Plan of Care (Signed)
[facility] Medicine ACCEPT NOTE    LOS: 0 days      BRIEF SYNOPSIS     26YO [place] male with hx of GERD, etOH abuse found unresponsive by bystanders, with water bottle containing unknown substance, requiring intubation in the ED for airway protection given GCS 7 and admitted to MICU. He was extubated today, and seen by toxicology.     Per ED note, patient had drank "Molly juice" from clear plastic bottle, that's potentially the same as "liquid ecstacy", slang for GHB (gamma hydroxy butyrate). He had AG 21 with bicarb 17. He also told MICU that he had "bed bug juice" (organophosphate). Not suspected to be suicidal attempt. UTox was negative but etoh level was 185. CTH negative. He received activated charcoal, narcan 0.04 x1 in the ED.     Ultimately, his presentation was due to GHB or EtOH. Or copious EtOH alone. Anticipate that his toxidrome will clear over the next 24 hours.     His vitals have been stable since extubation.     Subjective     Patient sleeping comfortably at this time. When awakened, he says he needs to rest. However, he did comment that his abdomen and chest feels crampy but was unable to clarify as he wants to go back to sleep.     Patient voices no other concerns at this time.    Objective     Physical Exam  Most Recent Vitals: BP 110/63 (BP Location: Right arm)   Pulse 82   Temp 35.9 C (96.6 F) (Temporal)    Resp 16   Ht 1.626 m ('5\' 4"' )   Wt 49.9 kg (110 lb)   SpO2 100%   BMI 18.88 kg/m2  BP: (93-133)/(63-98)   Temp:  [35.9 C (96.6 F)-37.6 C (99.7 F)]   Temp src: Temporal (06/12 1857)  Heart Rate:  [78-113]   Resp:  [11-33]   SpO2:  [100 %]   Height:  [162.6 cm ('5\' 4"' )]   Weight:  [49.9 kg (110 lb)]    I/O:   Intake/Output Summary (Last 24 hours) at 01/17/17 1912  Last data filed at 01/17/17 1323   Gross per 24 hour   Intake           2197.2 ml   Output             1450 ml   Net            747.2 ml       General: lying in bed in NAD  HEENT: PERRL, mmm    Cardiovascular: rrr, no mrg; chest wall pain reproducible with light palpation  Pulmonary: ctab   Gastrointestinal: soft, NABS. +epigastric tenderness to deep palpation  Musculoskeletal: no obvious deformities   Neurologic: aox3, no FND; falls back asleep easily   Skin: wwp, no rashes/lesions  Extremities: no edema, wwp     Recent Lab, Micro, and Imaging Studies:    Basic Metabolic Panel CBC   Recent Labs      01/17/17   0108  01/16/17   2235   Sodium  145   --    Potassium  3.2*   --    Chloride  107   --    CO2  17*   --    UN  4*   --    Creatinine  0.52*  0.53*   Glucose  120*   --    Calcium  8.0*   --  Magnesium  1.5   --      No components found with this basename: PHOS  Recent Labs      01/17/17   0108  01/16/17   2235   WBC  6.1  6.2   Hemoglobin  13.8   14.4  12.8*   Hematocrit  41  38*   Platelets  168  199      Liver Panel Other Labs   Recent Labs      01/17/17   0108  01/16/17   2238   AST  18  CANCELED   ALT  21  20   Alk Phos  59  48   Total Protein  6.0*  5.4*   Albumin  4.3  3.8   Bilirubin,Total  1.5*  0.8   Bilirubin,Direct  0.4*  <0.2   Amylase   --   18*   Lipase   --   27     Recent Labs      01/16/17   2238   CK  84     No components found with this basename: PT, APT, TROPONINI, TROPONINT, CKTOTAL, CKMBINDEX  No results for input(s): CHOL, HDL, LDL, TRIG in the last 72 hours.     Micro results           Radiology results   Ct Head Without Contrast    Result Date: 01/16/2017  Unremarkable END OF IMPRESSION [facility] submits this DICOM format image data and final report to the [facility], an independent secure electronic health information exchange, on a reciprocally searchable basis (with patient authorization) for a minimum of 12 months after exam date.     * Abdomen Standard Ap Single View/kub    Result Date: 01/17/2017  NG tube tip projects over the gastric body. Nonspecific bowel gas pattern. END REPORT I have personally reviewed the image(s) and the resident's interpretation and  agree with or edited the findings. [facility] submits this DICOM format image data and final report to the [facility], an independent secure electronic health information exchange, on a reciprocally searchable basis (with patient authorization) for a minimum of 12 months after exam date.     *chest Standard Single View    Result Date: 01/17/2017  Endotracheal tube tip projecting approximately 4.5 cm above the carina. END REPORT I have personally reviewed the image(s) and the resident's interpretation and agree with or edited the findings. [facility] submits this DICOM format image data and final report to the [facility], an independent secure electronic health information exchange, on a reciprocally searchable basis (with patient authorization) for a minimum of 12 months after exam date.          Assessment     26YO [place] male with hx of GERD, etOH abuse found unresponsive by bystanders, with water bottle containing unknown substance, requiring intubation in the ED for airway protection given GCS 7 and admitted to MICU. He was extubated today, and seen by toxicology, thought to be due to liquid ecstacy vs organophosphate vs copious etoh ingestion.     Plan     Toxidrome: liquid ecstacy vs organophosphate vs copious EtOH   - monitor, clearing on his own   - several organophosphate labs are signed&held - needs attending cosign   - EKG was sinus tachy with normal QTc  - CIWA, watch for withdrawal symptoms     Nonspecific abd pain likely due to toxic ingestion vs early withdrawal vs gerd, chest pain is  clearly musculoskeletal; nontoxic appearing   - monitor as pt was able to sleep very comfortably   - pt had been on ranitidine in the past, will schedule that     [place] likely due to toxic ingestion, etoh   - anticipate that it will improve   - replete lytes prn, will repeat a set of labs now (had K and Mg repleted today)     Etoh abuse   - place on CIWA  - thiamine, folate           F: PO  E: daily  N:  regular  Ppx: DVT: lovenox, PUD: none, Bowel: none  Discharge Plan: pending stability     Code Status: Full Code      Elenore Paddy, MD on 01/17/2017 at 7:12 PM; Internal Medicine PGY-2  PIC# [phone]

## 2017-01-18 DIAGNOSIS — G253 Myoclonus: Secondary | ICD-10-CM

## 2017-01-18 DIAGNOSIS — J029 Acute pharyngitis, unspecified: Secondary | ICD-10-CM

## 2017-01-18 DIAGNOSIS — F10929 Alcohol use, unspecified with intoxication, unspecified: Secondary | ICD-10-CM

## 2017-01-18 DIAGNOSIS — G92 Toxic encephalopathy: Secondary | ICD-10-CM

## 2017-01-18 LAB — COMPREHENSIVE METABOLIC PANEL
ALT: 21 U/L (ref 0–50)
AST: 21 U/L (ref 0–50)
Albumin: 4.1 g/dL (ref 3.5–5.2)
Alk Phos: 69 U/L (ref 40–130)
Anion Gap: 14 (ref 7–16)
Bilirubin,Total: 3.9 mg/dL — ABNORMAL HIGH (ref 0.0–1.2)
CO2: 25 mmol/L (ref 20–28)
Calcium: 8.6 mg/dL — ABNORMAL LOW (ref 9.0–10.3)
Chloride: 98 mmol/L (ref 96–108)
Creatinine: 0.56 mg/dL — ABNORMAL LOW (ref 0.67–1.17)
GFR,Black: 163 *
GFR,Caucasian: 141 *
Glucose: 96 mg/dL (ref 60–99)
Lab: 4 mg/dL — ABNORMAL LOW (ref 6–20)
Potassium: 3.5 mmol/L (ref 3.3–5.1)
Sodium: 137 mmol/L (ref 133–145)
Total Protein: 6 g/dL — ABNORMAL LOW (ref 6.3–7.7)

## 2017-01-18 LAB — CBC AND DIFFERENTIAL
Baso # K/uL: 0 10*3/uL (ref 0.0–0.1)
Basophil %: 0.3 %
Eos # K/uL: 0.2 10*3/uL (ref 0.0–0.5)
Eosinophil %: 1.9 %
Hematocrit: 41 % (ref 40–51)
Hemoglobin: 13.7 g/dL (ref 13.7–17.5)
IMM Granulocytes #: 0.1 10*3/uL (ref 0.0–0.1)
IMM Granulocytes: 0.4 %
Lymph # K/uL: 1.5 10*3/uL (ref 1.3–3.6)
Lymphocyte %: 13.1 %
MCH: 31 pg/cell (ref 26–32)
MCHC: 34 g/dL (ref 32–37)
MCV: 93 fL — ABNORMAL HIGH (ref 79–92)
Mono # K/uL: 0.7 10*3/uL (ref 0.3–0.8)
Monocyte %: 5.9 %
Neut # K/uL: 8.8 10*3/uL — ABNORMAL HIGH (ref 1.8–5.4)
Nucl RBC # K/uL: 0 10*3/uL (ref 0.0–0.0)
Nucl RBC %: 0 /100 WBC (ref 0.0–0.2)
Platelets: 156 10*3/uL (ref 150–330)
RBC: 4.4 MIL/uL — ABNORMAL LOW (ref 4.6–6.1)
RDW: 12 % (ref 11.6–14.4)
Seg Neut %: 78.4 %
WBC: 11.3 10*3/uL — ABNORMAL HIGH (ref 4.2–9.1)

## 2017-01-18 MED ORDER — MENTHOL THROAT LOZENGE *I*
1.0000 | LOZENGE | OROMUCOSAL | Status: DC | PRN
Start: 2017-01-18 — End: 2017-01-21
  Administered 2017-01-18: 1 via BUCCAL
  Filled 2017-01-18: qty 1

## 2017-01-18 MED ORDER — SODIUM CHLORIDE 0.9 % IV SOLN WRAPPED *I*
100.0000 mL/h | Status: DC
Start: 2017-01-18 — End: 2017-01-19
  Administered 2017-01-18 – 2017-01-19 (×2): 100 mL/h via INTRAVENOUS

## 2017-01-18 MED ORDER — NICOTINE 21 MG/24HR TD PT24 *I*
1.0000 | MEDICATED_PATCH | Freq: Every day | TRANSDERMAL | Status: DC
Start: 2017-01-19 — End: 2017-01-21
  Administered 2017-01-19 – 2017-01-20 (×2): 1 via TRANSDERMAL
  Filled 2017-01-18 (×3): qty 1

## 2017-01-18 MED ORDER — NAPROXEN 250 MG PO TABS *I*
250.0000 mg | ORAL_TABLET | Freq: Two times a day (BID) | ORAL | Status: DC | PRN
Start: 2017-01-18 — End: 2017-01-19
  Administered 2017-01-18 – 2017-01-19 (×2): 250 mg via ORAL
  Filled 2017-01-18 (×2): qty 1

## 2017-01-18 MED ORDER — NAPROXEN 250 MG PO TABS *I*
250.0000 mg | ORAL_TABLET | Freq: Two times a day (BID) | ORAL | Status: DC
Start: 2017-01-18 — End: 2017-01-18
  Filled 2017-01-18: qty 1

## 2017-01-18 MED ORDER — PANTOPRAZOLE SODIUM 40 MG IV SOLR *I*
40.0000 mg | Freq: Two times a day (BID) | INTRAVENOUS | Status: DC
Start: 2017-01-18 — End: 2017-01-20
  Administered 2017-01-18 – 2017-01-20 (×4): 40 mg via INTRAVENOUS
  Filled 2017-01-18 (×4): qty 10

## 2017-01-18 NOTE — Progress Notes (Addendum)
Toxicology Progress Note    Requesting Physician: Almeta Monas, MD  Reason for Consult: AMS in setting of unknown liquid ingestion    Length of Stay: 1 days    Subjective: Pt more awake today, able to talk with [place] about event. Fiance is at bedside who only speaks [place]. States he had "bed bug killer" in a water bottle in his car and mistakenly drank it thinking it was another substance. Denies he was trying to hurt himself. Used translator phone to talk to fiance who reports no recent fights or no concerns for pt to harm himself intentionally.     Pt reports sore throat and dark urine along with chest pain with coughing.      Patient Active Problem List    Diagnosis Date Noted    Mental status alteration 01/17/2017     Past Medical History:   Diagnosis Date    Alcohol abuse     GERD (gastroesophageal reflux disease)       No past surgical history on file.   No prescriptions prior to admission.     Not on File   Social History   Substance Use Topics    Smoking status: Not on file    Smokeless tobacco: Not on file    Alcohol use Not on file      History reviewed. No pertinent family history.     Scheduled Meds:   naproxen  250 mg Oral BID WC    enoxaparin  40 mg Subcutaneous Daily    thiamine  100 mg Oral Daily    folic acid  1 mg Oral Daily    ranitidine  150 mg Oral 2 times per day     Continuous Infusions:  PRN Meds:phenol-menthol    Objective:    Vital signs in last 24 hours:  Temp:  [35.9 C (96.6 F)-36.4 C (97.5 F)] 36.4 C (97.5 F)  Heart Rate:  [68-82] 77  Resp:  [14-18] 14  BP: (100-110)/(57-64) 107/63    Intake/Output last 3 shifts:  I/O last 3 completed shifts:  06/12 1500 - 06/13 1459  In: - (0 mL/kg)   Out: 400 (8 mL/kg) [Urine:400 (0.3 mL/kg/hr)]  Net: -400  Weight: 49.9 kg      Physical Exam   Constitutional: He appears well-developed and well-nourished. No distress. He is not intubated.   Patient more alert today, answering basic questions in [place]:   Head:  Normocephalic and atraumatic.   Nose: Nose normal.   Mouth/Throat: Oropharynx is erythematous and edematous  Eyes: Conjunctivae and EOM are normal. Pupils are equal, round, and reactive to light.   Neck: Normal range of motion. Neck supple.   Cardiovascular: Normal rate, regular rhythm, normal heart sounds and intact distal pulses.    Pulmonary/Chest: Effort normal and breath sounds normal. He is not intubated. He has no decreased breath sounds. He has no wheezes. He has no rhonchi. He has no rales.   Abdominal: Soft. Bowel sounds are normal. He exhibits no distension. There is no tenderness.   Lymphadenopathy:     He has no cervical adenopathy.   Neurological:   Following commands, moving in bed, interacting, no tremors  Skin: Skin is warm and dry. No rash noted.   Nursing note and vitals reviewed.    Recent Results (from the past 48 hour(s))   CBC and differential    Collection Time: 01/16/17 10:35 PM   Result Value Ref Range    WBC 6.2  4.2 - 9.1 THOU/uL    RBC 4.1 (L) 4.6 - 6.1 MIL/uL    Hemoglobin 12.8 (L) 13.7 - 17.5 g/dL    Hematocrit 38 (L) 40 - 51 %    MCV 94 (H) 79 - 92 fL    MCH 31 26 - 32 pg/cell    MCHC 34 32 - 37 g/dL    RDW 12.0 11.6 - 14.4 %    Platelets 199 150 - 330 THOU/uL    Seg Neut % 63.4 %    Lymphocyte % 31.0 %    Monocyte % 4.2 %    Eosinophil % 0.6 %    Basophil % 0.5 %    Neut # K/uL 4.0 1.8 - 5.4 THOU/uL    Lymph # K/uL 1.9 1.3 - 3.6 THOU/uL    Mono # K/uL 0.3 0.3 - 0.8 THOU/uL    Eos # K/uL 0.0 0.0 - 0.5 THOU/uL    Baso # K/uL 0.0 0.0 - 0.1 THOU/uL    Nucl RBC % 0.0 0.0 - 0.2 /100 WBC    Nucl RBC # K/uL 0.0 0.0 - 0.0 THOU/uL    IMM Granulocytes # 0.0 0.0 - 0.1 THOU/uL    IMM Granulocytes 0.3 %   Plasma profile 7 (Adult ED only)    Collection Time: 01/16/17 10:35 PM   Result Value Ref Range    Chloride,Plasma 114 (H) 96 - 108 mmol/L    CO2,Plasma 20 20 - 28 mmol/L    Potassium,Plasma 3.8 3.4 - 4.7 mmol/L    Sodium,Plasma 146 (H) 133 - 145 mmol/L    Anion Gap,PL 12 7 - 16    UN,Plasma 5 (L)  6 - 20 mg/dL    Creatinine 0.53 (L) 0.67 - 1.17 mg/dL    GFR,Caucasian 145 *    GFR,Black 167 *    Glucose,Plasma 96 60 - 99 mg/dL   EKG 12 lead    Collection Time: 01/16/17 10:36 PM   Result Value Ref Range    Rate 114 bpm    PR 164 ms    P 72 deg    QRSD 98 ms    QT 324 ms    QTc 447 ms    QRS 69 deg    T 192 deg    Severity - OTHERWISE NORMAL ECG -     Statement SINUS TACHYCARDIA    CK    Collection Time: 01/16/17 10:38 PM   Result Value Ref Range    CK 84 46 - 528 U/L   Salicylate level    Collection Time: 01/16/17 10:38 PM   Result Value Ref Range    Salicylate <4.1 (L) 32.4 - 30.0 mg/dL   Acetaminophen level    Collection Time: 01/16/17 10:38 PM   Result Value Ref Range    Acetaminophen <5 ug/mL   TSH    Collection Time: 01/16/17 10:38 PM   Result Value Ref Range    TSH 0.87 0.27 - 4.20 uIU/mL   Drug screen chemical dependency, urine    Collection Time: 01/16/17 10:38 PM   Result Value Ref Range    Amphetamine,UR NEG     Cocaine/Metab,UR NEG     Benzodiazepinen,UR NEG     Opiates,UR NEG     THC Metabolite,UR NEG    Drug screen panel, urine    Collection Time: 01/16/17 10:38 PM   Result Value Ref Range    Drug Remark,UR see text     Amphetamine,UR NEG  Barbiturate,UR NEG     Cocaine/Metab,UR NEG     Benzodiazepinen,UR NEG     Opiates,UR NEG     Propoxyphene,UR NEG     THC Metabolite,UR NEG     Add'l Drugs,UR See Text    RUQ panel (ED only)    Collection Time: 01/16/17 10:38 PM   Result Value Ref Range    Amylase 18 (L) 28 - 100 U/L    Lipase 27 13 - 60 U/L    Total Protein 5.4 (L) 6.3 - 7.7 g/dL    Albumin 3.8 3.5 - 5.2 g/dL    Bilirubin,Total 0.8 0.0 - 1.2 mg/dL    Bilirubin,Direct <0.2 0.0 - 0.3 mg/dL    Alk Phos 48 40 - 130 U/L    AST CANCELED 0 - 50 U/L    ALT 20 0 - 50 U/L   Alcohol screen (Incl: Isopropanol, Ethanol, Methanol, Acetone)    Collection Time: 01/16/17 10:38 PM   Result Value Ref Range    Ethanol 185 (H) 0 - 9 mg/dL    Methanol <10 0 - 9 mg/dL    Isopropanol <10 0 - 9 mg/dL    Acetone <10 0  - 9 mg/dL   Ethylene glycol    Collection Time: 01/16/17 10:38 PM   Result Value Ref Range    Ethylene Glycol <10 mg/dL   Venous blood gas    Collection Time: 01/17/17  1:08 AM   Result Value Ref Range    Hemoglobin 14.4 13.7 - 17.5 g/dL    PH,VENOUS 7.43 (H) 7.32 - 7.42    PCO2,VENOUS 30 (L) 40 - 50 mm Hg    PO2,VENOUS 258 (H) 25 - 43 mm Hg    Bicarbonate,VENOUS 19 (L) 21 - 28 mmol/L    Base Excess,VENOUS -4 (L) -3 - 1 mmol/L    CO2 (Calc),VENOUS 20 (L) 22 - 31 mmol/L    FO2 HB,VENOUS 98 (H) 63 - 83 %    CO 0.7 %    Methemoglobin 0.4 0.0 - 1.0 %   CBC and differential    Collection Time: 01/17/17  1:08 AM   Result Value Ref Range    WBC 6.1 4.2 - 9.1 THOU/uL    RBC 4.4 (L) 4.6 - 6.1 MIL/uL    Hemoglobin 13.8 13.7 - 17.5 g/dL    Hematocrit 41 40 - 51 %    MCV 92 79 - 92 fL    MCH 31 26 - 32 pg/cell    MCHC 34 32 - 37 g/dL    RDW 12.0 11.6 - 14.4 %    Platelets 168 150 - 330 THOU/uL    Seg Neut % 68.0 %    Lymphocyte % 25.7 %    Monocyte % 4.6 %    Eosinophil % 1.0 %    Basophil % 0.5 %    Neut # K/uL 4.1 1.8 - 5.4 THOU/uL    Lymph # K/uL 1.6 1.3 - 3.6 THOU/uL    Mono # K/uL 0.3 0.3 - 0.8 THOU/uL    Eos # K/uL 0.1 0.0 - 0.5 THOU/uL    Baso # K/uL 0.0 0.0 - 0.1 THOU/uL    Nucl RBC % 0.0 0.0 - 0.2 /100 WBC    Nucl RBC # K/uL 0.0 0.0 - 0.0 THOU/uL    IMM Granulocytes # 0.0 0.0 - 0.1 THOU/uL    IMM Granulocytes 0.2 %     *Note: Due to a large number of results and/or encounters  for the requested time period, some results have not been displayed. A complete set of results can be found in Results Review.     Assessment/Plan:  Brett Murphy is a 26 y.o. male PMH of ETOH abuse and GERD who presents to ED unresponsive in setting of ingesting unknown liquid. Pt more awake and alert today, answering more questions during exam although still unsure about what he actually drank to cause his mental status change.     Recommendations:  - Oropharynx is very erythematous and edematous, suggesting possible caustic/corrosive etiology from  unknown substance. Would recommend GI consult for possible scope if primary team agrees. Discussed with Romana Juniper, NP.  - Continue to monitor bilirubin levels (Tbili 0.8 on admission and now 3.9 with no transaminitis)  - Tox following  - Rest of care per primary team    Pt seen and discussed with Dr. Elenore Paddy. Please [facility] Toxicology attending with any questions.    Wynn Maudlin, [place]  01/18/2017 5:29 PM    Medical Toxicology Faculty Note Dr. Payton Doughty attending  Patient seen and assessed and discussed with PA Toxicology resident and I have reviewed the above note and agree with the history, exam, assessment and plan.  All data and labs reviewed and I have independently evaluated the patient.      Patient interviewed with translator phone and he reported ingesting bed bug clearner and now having throat, chest and abd pain.  On exam has significant erythema and irritation to palate and posterior pharynx.  He is hoarse.  I suggest obtaining GI consult to consider EGD his presentation is suspicious for alcohol intoxication and self harm -though he adamantly denies this -his affect is odd he is withdrawn, poor eye contact,  I'm concerned he didn't ingest caustic agent or other after the alcohol at this point his intoxication was c/w alcohol intoxication and perhaps other but I think there was some misleading history --GI evaluation and EGD to assess esophagus and stomach.      ? Caustic ingestion vs other after alcohol intoxication.     tox available at [phone].    Seen on 01/18/2017 at 16:05 x 36 minutes and additional phone call after to primary team to discuss assessment (discussion with tox resident).

## 2017-01-18 NOTE — Progress Notes (Addendum)
Patient seen and d/w Dr Chales AbrahamsGupta & chart reviewed. Pain in his chest that is worse with breathing and movement. Throat is sore. States his urine is black. Voiding into toilet.    Vitals:    01/18/17 0834 01/18/17 0838 01/18/17 1211 01/18/17 1636   BP:  100/64 103/57 107/63   BP Location:  Right arm Right arm Right arm   Pulse:  74 68 77   Resp:  18 18 14    Temp:  36.1 C (97 F) 36.2 C (97.2 F) 36.4 C (97.5 F)   TempSrc:  Temporal Temporal Temporal   SpO2: 98% 100% 100% 98%   Weight:       Height:           Exam:  General: lying in bed. Alert. Appears uncomfortable.   Oropharyngeal: Unable to see back of throat with attempt and view with flashlight.  Lungs: Breathing easy and regular on RA.  See Dr Donne HazelGuptas note for full PE.    Labs: reviewed      Recent Labs  Lab 01/18/17  0049 01/17/17  0108 01/16/17  2235   WBC 11.3* 6.1 6.2   Hemoglobin 13.7 13.8   14.4 12.8*   Hematocrit 41 41 38*   Platelets 156 168 199         Lab results: 01/18/17  0049 01/17/17  2207 01/17/17  0108   Sodium 137 138 145   Potassium 3.5 3.5 3.2*   Chloride 98 100 107   CO2 25 25 17*   UN 4* 4* 4*   Creatinine 0.56* 0.54* 0.52*   GFR,Caucasian 141 143 146   GFR,Black 163 166 168   Glucose 96 114* 120*   Calcium 8.6* 8.6* 8.0*       No results for input(s): INR in the last 168 hours.  Recent Labs      01/18/17   0049  01/17/17   0108  01/16/17   2238   AST  21  18  CANCELED   ALT  21  21  20    Bilirubin,Total  3.9*  1.5*  0.8   Bilirubin,Direct   --   0.4*  <0.2     No components found with this basename: ALKPHOS    No results for input(s): PGLU in the last 168 hours.    A/P:     Ingested substance, found down, Intubated:  -Now extubated on on floor bed.  -No active sings of withdrawal  -Appreciate Tox rec's. Will follow blood work.  -CK normal  -Prn Lozenge for throat discomfort d/t intubation.  -CIWA for ETOH    CP:  -Likely MSK  -Hot packs prn to chest  -Naproxen BID    Urine: Reports urine is black.  -Gave urinal to urinate into. No urine  this afternoon  -Per nursing he is taking ok PO  -No tachycardia on VS, BP stable.  -Enc PO intake  -Record I&O's.    DVT prophylaxis: Lovenox  Dispo: Home when medically stable.    ADDENDUM: Per Tox throat sore and upon use of a tongue depressor were concerned for oropharyngeal redness and swelling. Rec GI consult to assess need for scope. D/w attending and paged for GI Consult.    ADDENDUM: Spoke with GI. Keep NPO p MN for ? Scope in am. Gi will see first thing in the am. Rec protonix. Started IVF 11500ml/hr as his intake is poor. Urinated 200cc's of amber urine this evening per nursing. Ordered Protonix 40 mg BID  IV. Monitor vs and 02 sat's.        Kingsley Spittle, NP  4:57 PM  01/18/2017    No prescriptions prior to admission.     Scheduled Meds:   naproxen  250 mg Oral BID WC    enoxaparin  40 mg Subcutaneous Daily    thiamine  100 mg Oral Daily    folic acid  1 mg Oral Daily    ranitidine  150 mg Oral 2 times per day

## 2017-01-18 NOTE — Progress Notes (Signed)
01/18/17 1800   Oxygen Therapy   SpO2 99 %   O2 Therapy None (Room air)   Mobility   Head of Bed Elevated  Self regulated   Repositioned Turns self   Activity Out of Bed;In Bed;Ambulate in room;Bathroom privileges   Level of Assistance Standby assist, set-up cues, supervision of patient - no hands on   Range of Motion AROM;All extremities   Heels Elevated N/A   Transport Method Ambulatory   Assistive Device None   Distance Ambulated (ft) 20 ft   Ambulation Response Tolerated fairly well     02 sat after walking 20 feet was 99% on room air     Dulcy FannyKATHERINE N Honestie Kulik, LPN

## 2017-01-18 NOTE — Progress Notes (Signed)
01/18/17 1100   [facility] Patient Class Review   Patient Class Review Inpatient   Patient Class Effective 01/17/2017.     Jannetta QuintAileen Zenith Kercheval, RN

## 2017-01-18 NOTE — Progress Notes (Addendum)
Acute Care Coordinator Assessment    Principal Problem: Altered mental status.     Address/Phone/PCP: Verified on facesheet. PCP is Dr. Saddie Bendersavid Hough affiliated with [facility] [phone].  Home set up/Lives with: Pt is from home with family.   CG/Support system (meals, groceries, laundry, transportation)/ADL's: Pt is independent with mobility and ADL's at baseline. Pt has an supportive family and girlfriend,   Medication Management: Pt self manages medications.   Transportation: Family.  Current DME/Oxygen: None.  Home Care: None.  PCP/Specialist Appointments: All appointments will be scheduled prior to discharge.     Plan/Dispo: Home pending hospital course.     Chart reviewed. [facility] will continue to follow clinical course for any discharge needs.     Delrae SawyersKelly Danaja Lasota RN BSN  Acute Care Coordinator [phone]  [phone]  Home Care Liaison Weekend pager [phone]

## 2017-01-18 NOTE — Plan of Care (Signed)
Nutrition     Patient's nutritional status is maintained or improved Goal not met          Cognitive function     Cognitive function will be maintained or return to baseline Maintaining        Mobility     Patient's functional status is maintained or improved Maintaining        Pain/Comfort     Patient's pain or discomfort is manageable Maintaining        Psychosocial     Demonstrates ability to cope with illness Maintaining        Safety     Patient will remain free of falls Maintaining     Prevent any intentional injury Maintaining

## 2017-01-19 DIAGNOSIS — R1013 Epigastric pain: Secondary | ICD-10-CM

## 2017-01-19 DIAGNOSIS — T5494XA Toxic effect of unspecified corrosive substance, undetermined, initial encounter: Secondary | ICD-10-CM

## 2017-01-19 LAB — COMPREHENSIVE METABOLIC PANEL
ALT: 20 U/L (ref 0–50)
AST: 20 U/L (ref 0–50)
Albumin: 3.9 g/dL (ref 3.5–5.2)
Alk Phos: 66 U/L (ref 40–130)
Anion Gap: 13 (ref 7–16)
Bilirubin,Total: 1.4 mg/dL — ABNORMAL HIGH (ref 0.0–1.2)
CO2: 25 mmol/L (ref 20–28)
Calcium: 8.6 mg/dL — ABNORMAL LOW (ref 9.0–10.3)
Chloride: 106 mmol/L (ref 96–108)
Creatinine: 0.54 mg/dL — ABNORMAL LOW (ref 0.67–1.17)
GFR,Black: 166 *
GFR,Caucasian: 143 *
Glucose: 107 mg/dL — ABNORMAL HIGH (ref 60–99)
Lab: 5 mg/dL — ABNORMAL LOW (ref 6–20)
Potassium: 3.7 mmol/L (ref 3.3–5.1)
Sodium: 144 mmol/L (ref 133–145)
Total Protein: 5.8 g/dL — ABNORMAL LOW (ref 6.3–7.7)

## 2017-01-19 MED ORDER — SODIUM CHLORIDE 0.9 % IV SOLN WRAPPED *I*
75.0000 mL/h | Status: DC
Start: 2017-01-20 — End: 2017-01-21
  Administered 2017-01-19 – 2017-01-20 (×2): 75 mL/h via INTRAVENOUS

## 2017-01-19 MED ORDER — SUCRALFATE 1 GM/10ML PO SUSP *I*
1.0000 g | Freq: Four times a day (QID) | ORAL | Status: DC
Start: 2017-01-19 — End: 2017-01-20
  Administered 2017-01-19 – 2017-01-20 (×3): 1 g via ORAL
  Filled 2017-01-19 (×10): qty 10

## 2017-01-19 MED ORDER — DIPHENHYDRAMINE-LIDOCAINE-MAALOX (BMX/FIRST MOUTHWASH) *WRAPPED*
15.0000 mL | ORAL | Status: DC | PRN
Start: 2017-01-19 — End: 2017-01-21
  Administered 2017-01-20: 15 mL via OROMUCOSAL
  Filled 2017-01-19 (×6): qty 15

## 2017-01-19 MED ORDER — ACETAMINOPHEN 500 MG PO TABS *I*
1000.0000 mg | ORAL_TABLET | Freq: Three times a day (TID) | ORAL | Status: DC | PRN
Start: 2017-01-19 — End: 2017-01-21
  Administered 2017-01-19 – 2017-01-21 (×2): 1000 mg via ORAL
  Filled 2017-01-19 (×2): qty 2

## 2017-01-19 NOTE — Progress Notes (Addendum)
Patient seen several times today & chart reviewed. Talking on the phone with his girlfriend most of the morning. Assisted pt 2 times into the bathroom to urinate with IV pole. Unsure this am about having EGD and crying at one point when came to talk to him. Denied pain. Offered to sit and talk with him he declined.     Vitals:    01/19/17 0710 01/19/17 0814 01/19/17 1218 01/19/17 1609   BP:  102/60 116/71 120/69   BP Location:  Right arm Right arm Right arm   Pulse:  64 75 78   Resp: 20 15 16 16    Temp:  36.2 C (97.2 F) 36.1 C (97 F) 36.4 C (97.5 F)   TempSrc:  Temporal Temporal Temporal   SpO2:  100% 100% 99%   Weight:       Height:           Exam:  General: lying in bed. Alert.    Lungs: Breathing easy and regular on RA.  See Dr Donne HazelGuptas note for full PE.    Labs: reviewed      Recent Labs  Lab 01/18/17  0049 01/17/17  0108 01/16/17  2235   WBC 11.3* 6.1 6.2   Hemoglobin 13.7 13.8   14.4 12.8*   Hematocrit 41 41 38*   Platelets 156 168 199         Lab results: 01/19/17  0211 01/18/17  0049 01/17/17  2207   Sodium 144 137 138   Potassium 3.7 3.5 3.5   Chloride 106 98 100   CO2 25 25 25    UN 5* 4* 4*   Creatinine 0.54* 0.56* 0.54*   GFR,Caucasian 143 141 143   GFR,Black 166 163 166   Glucose 107* 96 114*   Calcium 8.6* 8.6* 8.6*       No results for input(s): INR in the last 168 hours.  Recent Labs      01/19/17   0211  01/18/17   0049  01/17/17   0108  01/16/17   2238   AST  20  21  18   CANCELED   ALT  20  21  21  20    Bilirubin,Total  1.4*  3.9*  1.5*  0.8   Bilirubin,Direct   --    --   0.4*  <0.2     No components found with this basename: ALKPHOS    No results for input(s): PGLU in the last 168 hours.    A/P:     Plan of care d/w Dr Chales AbrahamsGupta. See their note for full plan of care and PE.    Ingested substance, found down, Intubated:  -Now extubated on on floor bed.  -No active sings of withdrawal  -Appreciate Tox rec's. Will follow blood work.  -CK normal  -Prn Lozenge for throat discomfort d/t  intubation.  -CIWA for ETOH    CP: ? MSK  -Hot packs prn to chest  -D/c Naproxen     Throat pain:   -GI consulted. Ordered carafate, and PRN BMX for throat pain.  -C/w Protonix 40 mg BID.  -Clears now NPO p MN  -Ordered IVF once NPO  -EGD in am  -CBC, BMP, INR in am    Prophylaxis: Lovenox  Dispo: Home when medically stable.    Kingsley Spittleebecca J Travarus Trudo, NP  5:06 PM  01/19/2017    No prescriptions prior to admission.     Scheduled Meds:   sucralfate  1 g Oral 4  times per day    pantoprazole  40 mg Intravenous Q12H    nicotine  1 patch Transdermal Daily    enoxaparin  40 mg Subcutaneous Daily    thiamine  100 mg Oral Daily    folic acid  1 mg Oral Daily    ranitidine  150 mg Oral 2 times per day

## 2017-01-19 NOTE — Consults (Addendum)
Division of Gastroenterology and Hepatology Initial Consult    Admit Date:  01/16/2017  Attending Provider:  Ellin Mayhew, MD                                  Primary Care Physician:  Windell Norfolk, MD   Admitting Diagnosis: Found down    Consult reason: Concern for mucosal injury from toxic ingestion    Subjective:  Chart reviewed. History obtained from patient and chart review. Spoke with pt with interpreter blue phone     Brett Murphy is a 26 y.o. male with a past medical history notable for EtOH abuse and GERD on ranitidine who presented to ED two days after being found non-responsive in setting of unknown ingestion requiring intubation, extubated 6/12. Per pt bed bug cleaner, Per toxicology believes presentation c/w alcohol intoxication. GI now consulted as pt is complaining of throat, chest and abdominal pain and there is concern for mucosal injury.       At present, pt mentions pain with swallowing as well as sore throat, stabbing chest pain and upper abdominal pain. He mentions alcohol use socially with friends and denies NSAID use. Smokes 3 cigs/day. No black or bloody stools. Denies history of EGD.     At present, VS WNL. Labs notable for:  -WBC 6 --> 11.3  -H/H 13.7/41  -Plts 156  -BMP WNL  -LFTs with improving T bili from 3.9 --> 1.4    Pt now on prootonix 40 IV BID and zantac 150 BID    The remainder of the GI review of systems is negative for jaundice, pruritus, scleral icterus,  bloating, or cramping,  nausea, vomiting, hematemesis, diarrhea, constipation, hematochezia, and melena.    Review of Systems  As per HPI. 10 point review of systems reviewed and otherwise negative.       Medications:  Home Medications:  Prior to Admission medications    Not on File     Scheduled Meds   pantoprazole  40 mg Intravenous Q12H    nicotine  1 patch Transdermal Daily    enoxaparin  40 mg Subcutaneous Daily    thiamine  100 mg Oral Daily    folic acid  1 mg Oral Daily    ranitidine  150 mg Oral 2 times per day      Continuous Infusions   sodium chloride 100 mL/hr (01/19/17 0315)     PRN Meds  phenol-menthol, naproxen    Allergies/Sensitivities:   Allergies as of 01/16/2017    (Not on File)       Past Medical Hx:   Past Medical History:   Diagnosis Date    Alcohol abuse     GERD (gastroesophageal reflux disease)        Past Surgical Hx: No past surgical history on file.    Social Hx:   has no tobacco history on file.   has no alcohol history on file.   has no drug history on file.    Family Hx: family history is not on file.    PHYSICAL EXAM:  Patient Vitals for the past 72 hrs:   BP Temp Temp src Pulse Resp SpO2 Height Weight   01/19/17 0710 - - - - 20 - - -   01/19/17 0428 92/52 36 C (96.8 F) TEMPORAL 56 16 99 % - -   01/18/17 2336 104/58 36.7 C (98.1 F) TEMPORAL 68 18 99 % - -  01/18/17 2010 107/64 36.7 C (98.1 F) TEMPORAL 76 16 99 % - -   01/18/17 1800 - - - - - 99 % - -   01/18/17 1636 107/63 36.4 C (97.5 F) TEMPORAL 77 14 98 % - -   01/18/17 1211 103/57 36.2 C (97.2 F) TEMPORAL 68 18 100 % - -   01/18/17 0838 100/64 36.1 C (97 F) TEMPORAL 74 18 100 % - -   01/18/17 0834 - - - - - 98 % - -   01/18/17 0744 - - - - 18 - - -   01/18/17 0033 103/57 36.3 C (97.3 F) TEMPORAL 72 16 100 % - -   01/17/17 1857 110/63 35.9 C (96.6 F) TEMPORAL 82 16 100 % - -   01/17/17 1600 110/67 36.8 C (98.2 F) TEMPORAL 88 16 100 % - -   01/17/17 1200 119/81 37.3 C (99.1 F) TEMPORAL 93 17 100 % - -   01/17/17 1000 127/77 - - 92 17 100 % - -   01/17/17 0900 130/85 - - 94 15 100 % - -   01/17/17 0840 133/88 - - 104 18 100 % - -   01/17/17 0803 - - - - - 100 % - -   01/17/17 0800 (!) 119/92 36.6 C (97.9 F) TEMPORAL 93 (!) 25 100 % - -   01/17/17 0744 - - - - - 100 % - -   01/17/17 0700 118/86 - - 83 16 100 % - -   01/17/17 0622 128/82 37.6 C (99.7 F) TEMPORAL 101 18 100 % - -   01/17/17 0500 119/85 - - 88 16 100 % - -   01/17/17 0411 103/75 - - 94 (!) 33 100 % - -   01/17/17 0200 98/69 - - 81 18 100 % - -   01/17/17  0130 99/67 - - 78 18 100 % - -   01/17/17 0115 98/66 - - 80 18 100 % - -   01/17/17 0100 93/66 36.8 C (98.2 F) FOLEY 80 18 100 % - -   01/17/17 0043 97/68 - - 78 18 100 % - -   01/17/17 0030 100/69 - - 82 18 100 % - -   01/16/17 2348 124/90 - - 98 17 100 % - -   01/16/17 2327 - - - - - 100 % - -   01/16/17 2310 (!) 128/94 36.7 C (98.1 F) - (!) 113 18 100 % - -   01/16/17 2245 (!) 130/98 - - 102 18 100 % - -   01/16/17 2212 - - - - - 100 % - -   01/16/17 2142 128/86 36.6 C (97.9 F) TEMPORAL 97 11 100 % 162.6 cm ('5\' 4"' ) 49.9 kg (110 lb)     Wt Readings from Last 3 Encounters:   01/16/17 49.9 kg (110 lb)     I/O last 3 completed shifts:  06/13 0700 - 06/14 0659  In: - (0 mL/kg)   Out: 200 (4 mL/kg) [Urine:200 (0.2 mL/kg/hr)]  Net: -200  Weight: 49.9 kg     General appearance: alert, appears stated age and cooperative  Eye: EOM, anicteric   Ears, Nose, Mouth and throat: MMM, clear o/p without ulcers/lesions  Neck: supple, symmetrical, trachea midline   Cardiovascular: regular rate and rhythm   Respiratory: clear to auscultation bilaterally   Gastrointestinal: abdomen soft, slightly tender in epigastrium; +bowel sounds in all 4 quadrants; no masses   Skin: no  lesions or jaundice  Neurological: grossly normal   Psychiatric: normal affect   Extremities: extremities warm and dry. No edema    LAB DATA:      Lab results: 01/18/17  0049 01/17/17  0108 01/16/17  2235   WBC 11.3* 6.1 6.2   Hemoglobin 13.7 13.8   14.4 12.8*   Hematocrit 41 41 38*   RBC 4.4* 4.4* 4.1*   Platelets 156 168 199           Lab results: 01/19/17  0211 01/18/17  0049 01/17/17  2207 01/17/17  0108 01/16/17  2238 01/16/17  2235   Sodium 144 137 138 145  --   --    Potassium 3.7 3.5 3.5 3.2*  --   --    Chloride 106 98 100 107  --   --    CO2 '25 25 25 ' 17*  --   --    UN 5* 4* 4* 4*  --   --    Creatinine 0.54* 0.56* 0.54* 0.52*  --  0.53*   GFR,Caucasian 143 141 143 146  --   --    GFR,Black 166 163 166 168  --   --    Glucose 107* 96 114* 120*  --    --    Calcium 8.6* 8.6* 8.6* 8.0*  --   --    Total Protein 5.8* 6.0*  --  6.0* 5.4*  --    Albumin 3.9 4.1  --  4.3 3.8  --    ALT 20 21  --  21 20  --    AST 20 21  --  18 CANCELED  --    Alk Phos 66 69  --  59 48  --    Bilirubin,Total 1.4* 3.9*  --  1.5* 0.8  --        No results for input(s): INR in the last 8760 hours.       Ethanol 185 (H) 0 - 9 mg/dL    Methanol <10 0 - 9 mg/dL    Isopropanol <10 0 - 9 mg/dL    Acetone <10 0 - 9 mg/dL   Ethylene glycol    Collection Time: 01/16/17 10:38 PM   Result Value Ref Range    Ethylene Glycol <10 mg/dL      CK 84 46 - 161 U/L   Salicylate level    Collection Time: 01/16/17 10:38 PM   Result Value Ref Range    Salicylate <0.9 (L) 60.4 - 30.0 mg/dL   Acetaminophen level    Collection Time: 01/16/17 10:38 PM   Result Value Ref Range    Acetaminophen <5 ug/mL      Amphetamine,UR NEG     Cocaine/Metab,UR NEG     Benzodiazepinen,UR NEG     Opiates,UR NEG     THC Metabolite,UR NEG    Drug screen panel, urine    Collection Time: 01/16/17 10:38 PM   Result Value Ref Range    Drug Remark,UR see text     Amphetamine,UR NEG     Barbiturate,UR NEG     Cocaine/Metab,UR NEG     Benzodiazepinen,UR NEG     Opiates,UR NEG     Propoxyphene,UR NEG     THC Metabolite,UR NEG            IMAGING:  AXR:   Bowel gas pattern: Air and stool scattered in normal caliber colon. Paucity of small bowel gas limits its evaluation.    Tubes  and catheters: NG tube tip projects over the gastric body. Foley catheter tip projects over the bladder.    Bones and soft tissues: No acute osseous abnormality.    IMPRESSION:    NG tube tip projects over the gastric body. Nonspecific bowel gas pattern.      Assessment/Recommendations  26 y.o. male with a past medical history notable for EtOH abuse and GERD on ranitidine who presented to ED two days after being found non-responsive in setting of unknown ingestion requiring intubation, extubated 6/12. Per pt bed bug cleaner,  Per toxicology believes presentation c/w alcohol intoxication. GI now consulted as pt is complaining of throat, chest and abdominal pain and there is concern for mucosal injury.       Pt is having dysphagia and abdominal pain since ingestion of supposed bed bug treatment. He is without any GI bleeding or other GI-related complications. Therefore, at this time, EGD would be strictly for diagnostic purposes and stands the risk of increasing harm to any existing injury  and would not alter management significantly. We therefore favor conservative approach with medical optimization.     -Continue IV PPI BID. Can titrate down to daily as symptoms improve and his mucosa heals  -Can initiate carafate ATC for 2 week course and BMX solution PRN  -Initiate clear liquid diet and advance as tolerated    Please notify GI on call for any acute changes or questions    Case discussed with consult attending.      Candie Echevaria, MD  PGY-4, Gastroenterology & Hepatology Fellow    4:30 PM addendum:  -Pt seen with attending this afternoon. He notes ongoing pain with swallowing limiting his PO intake. Chart further reviewed. In light of toxicology's concern for unknown substance ingestion and rising WBC and ongoing symptoms, it would be of benefit to evaluate his upper GI tract to assess for any injury and the extent of the injury as this could help determine need for further treatment, such as extended monitoring or surgery.   -Will plan for EGD in AM. Discussed with pt via phone interpreter who understands risks of bleeding, infection, perforation and death and agreeable to proceed after discussing with his family.   -Please keep NPO at MN  -Full set of labs in AM    Primary team notified.    Candie Echevaria, MD  Gastroenterology and Hepatology Fellow [facility])    I saw and evaluated the patient. I agree with the resident's/fellow's findings and plan of care as documented above.    Reynaldo Minium, DO

## 2017-01-19 NOTE — Plan of Care (Signed)
Pt agreeable to EGD     Cognitive function     Cognitive function will be maintained or return to baseline Maintaining        Mobility     Patient's functional status is maintained or improved Maintaining        Nutrition     Patient's nutritional status is maintained or improved Maintaining        Pain/Comfort     Patient's pain or discomfort is manageable Maintaining        Psychosocial     Demonstrates ability to cope with illness Maintaining        Safety     Patient will remain free of falls Maintaining     Prevent any intentional injury Maintaining

## 2017-01-19 NOTE — Progress Notes (Addendum)
Toxicology Progress Note    Requesting Physician: Almeta Monas, MD  Reason for Consult: AMS in setting of unknown liquid ingestion    Length of Stay: 2 days    Subjective: Pt looks more comfortable sitting up in bed today. States his throat still hurts but medications have helped mildly with pain. Hurts to swallow water but encouraged him to keep drinking. Only has had a couple bites to eat due to pain. Reports epigastric discomfort.       Patient Active Problem List    Diagnosis Date Noted    Mental status alteration 01/17/2017     Past Medical History:   Diagnosis Date    Alcohol abuse     GERD (gastroesophageal reflux disease)       No past surgical history on file.   No prescriptions prior to admission.     Not on File   Social History   Substance Use Topics    Smoking status: Not on file    Smokeless tobacco: Not on file    Alcohol use Not on file      History reviewed. No pertinent family history.     Scheduled Meds:   sucralfate  1 g Oral 4 times per day    pantoprazole  40 mg Intravenous Q12H    nicotine  1 patch Transdermal Daily    enoxaparin  40 mg Subcutaneous Daily    thiamine  100 mg Oral Daily    folic acid  1 mg Oral Daily    ranitidine  150 mg Oral 2 times per day     Continuous Infusions:   [START ON 01/20/2017] sodium chloride       PRN Meds:BMX (DiphenydrAMINE-Lidocaine-Maalox), acetaminophen, phenol-menthol    Objective:    Vital signs in last 24 hours:  Temp:  [36 C (96.8 F)-36.7 C (98.1 F)] 36.4 C (97.5 F)  Heart Rate:  [56-78] 78  Resp:  [15-20] 16  BP: (92-120)/(52-71) 120/69    Intake/Output last 3 shifts:  I/O last 3 completed shifts:  06/13 1500 - 06/14 1459  In: - (0 mL/kg)   Out: 200 (4 mL/kg) [Urine:200 (0.2 mL/kg/hr)]  Net: -200  Weight: 49.9 kg      Physical Exam   Constitutional: He appears well-developed and well-nourished. No distress. He is not intubated.   Patient sitting up in bed, looks more comfortable than yesterday   HENT:   Head: Normocephalic  and atraumatic.   Nose: Nose normal.   Mouth/Throat: Oropharynx is mild erythema of posterior pharynx, improved from yesterday  Eyes: Conjunctivae and EOM are normal. Pupils are equal, round, and reactive to light.   Neck: Normal range of motion. Neck supple.   Cardiovascular: Normal rate, regular rhythm, normal heart sounds and intact distal pulses.    Pulmonary/Chest: Effort normal and breath sounds normal. He is not intubated. He has no decreased breath sounds. He has no wheezes. He has no rhonchi. He has no rales.   Abdominal: Soft. Bowel sounds are normal. He exhibits no distension. Epigastric tenderness.   Lymphadenopathy:     He has no cervical adenopathy.   Neurological:   A&O x 3, no focal neurologic deficits  Skin: Skin is warm and dry. No rash noted.   Nursing note and vitals reviewed.    Recent Results (from the past 48 hour(s))   Basic metabolic panel    Collection Time: 01/17/17 10:07 PM   Result Value Ref Range    Glucose 114 (H) 60 -  99 mg/dL    Sodium 138 133 - 145 mmol/L    Potassium 3.5 3.3 - 5.1 mmol/L    Chloride 100 96 - 108 mmol/L    CO2 25 20 - 28 mmol/L    Anion Gap 13 7 - 16    UN 4 (L) 6 - 20 mg/dL    Creatinine 0.54 (L) 0.67 - 1.17 mg/dL    GFR,Caucasian 143 *    GFR,Black 166 *    Calcium 8.6 (L) 9.0 - 10.3 mg/dL   Magnesium    Collection Time: 01/17/17 10:07 PM   Result Value Ref Range    Magnesium 1.7 1.3 - 2.1 mEq/L   CBC and differential    Collection Time: 01/18/17 12:49 AM   Result Value Ref Range    WBC 11.3 (H) 4.2 - 9.1 THOU/uL    RBC 4.4 (L) 4.6 - 6.1 MIL/uL    Hemoglobin 13.7 13.7 - 17.5 g/dL    Hematocrit 41 40 - 51 %    MCV 93 (H) 79 - 92 fL    MCH 31 26 - 32 pg/cell    MCHC 34 32 - 37 g/dL    RDW 12.0 11.6 - 14.4 %    Platelets 156 150 - 330 THOU/uL    Seg Neut % 78.4 %    Lymphocyte % 13.1 %    Monocyte % 5.9 %    Eosinophil % 1.9 %    Basophil % 0.3 %    Neut # K/uL 8.8 (H) 1.8 - 5.4 THOU/uL    Lymph # K/uL 1.5 1.3 - 3.6 THOU/uL    Mono # K/uL 0.7 0.3 - 0.8 THOU/uL    Eos  # K/uL 0.2 0.0 - 0.5 THOU/uL    Baso # K/uL 0.0 0.0 - 0.1 THOU/uL    Nucl RBC % 0.0 0.0 - 0.2 /100 WBC    Nucl RBC # K/uL 0.0 0.0 - 0.0 THOU/uL    IMM Granulocytes # 0.1 0.0 - 0.1 THOU/uL    IMM Granulocytes 0.4 %   Comprehensive metabolic panel    Collection Time: 01/18/17 12:49 AM   Result Value Ref Range    Sodium 137 133 - 145 mmol/L    Potassium 3.5 3.3 - 5.1 mmol/L    Chloride 98 96 - 108 mmol/L    CO2 25 20 - 28 mmol/L    Anion Gap 14 7 - 16    UN 4 (L) 6 - 20 mg/dL    Creatinine 0.56 (L) 0.67 - 1.17 mg/dL    GFR,Caucasian 141 *    GFR,Black 163 *    Glucose 96 60 - 99 mg/dL    Calcium 8.6 (L) 9.0 - 10.3 mg/dL    Total Protein 6.0 (L) 6.3 - 7.7 g/dL    Albumin 4.1 3.5 - 5.2 g/dL    Bilirubin,Total 3.9 (H) 0.0 - 1.2 mg/dL    AST 21 0 - 50 U/L    ALT 21 0 - 50 U/L    Alk Phos 69 40 - 130 U/L   Comprehensive metabolic panel    Collection Time: 01/19/17  2:11 AM   Result Value Ref Range    Sodium 144 133 - 145 mmol/L    Potassium 3.7 3.3 - 5.1 mmol/L    Chloride 106 96 - 108 mmol/L    CO2 25 20 - 28 mmol/L    Anion Gap 13 7 - 16    UN 5 (L) 6 - 20  mg/dL    Creatinine 0.54 (L) 0.67 - 1.17 mg/dL    GFR,Caucasian 143 *    GFR,Black 166 *    Glucose 107 (H) 60 - 99 mg/dL    Calcium 8.6 (L) 9.0 - 10.3 mg/dL    Total Protein 5.8 (L) 6.3 - 7.7 g/dL    Albumin 3.9 3.5 - 5.2 g/dL    Bilirubin,Total 1.4 (H) 0.0 - 1.2 mg/dL    AST 20 0 - 50 U/L    ALT 20 0 - 50 U/L    Alk Phos 66 40 - 130 U/L     Assessment/Plan:  Brett Murphy is a 26 y.o. male PMH of ETOH abuse and GERD who presents to ED unresponsive in setting of ingesting unknown liquid. Pt states pain has slightly improved with medications although still hurts when swallowing.     Recommendations:  - Appreciate GI recs, oropharynx looks improved today and agree with medications per GI  - Continue to monitor for hematemesis  - Tox following  - Rest of care per primary team    Pt seen and discussed with Dr. Elenore Paddy. Please [employer] Toxicology attending with any  questions.    Wynn Maudlin, [place]  01/19/2017 5:17 PM     Medical Toxicology Faculty Note Dr. Payton Doughty attending  Patient seen and assessed and discussed with PGY1 Toxicology PA and I have reviewed the above note and agree with the history, exam, assessment and plan.  All data and labs reviewed and I have independently evaluated the patient.      Patient he's not able to eat, has food he brought in from home (mother brought).  Interviewed with interpreter phone he again denies self harm.  Relates that he was drinking and ingested "bed bug solution," from water bottle in car.  Has significant pain with palpation in abd/epigastric area (no rebound) post throat improved on direct visualization -it was quite red/irritated yesterday.  Still with very flat, withdrawn affect.  Had discussed with GI concerns over ingestion possible caustic agent or hydrocarbon-containing solution.  Presentation and history odd even though denies ingestion to harm self I suggest f/u with psychiatry after GI eval and  ? EGD -this could put to rest whether or not actual mucosal injury and provide information about potential complications -if negative it would be useful.  EGD would be for diagnostic purposes.  Were a bit out from the ingestion GI will assess risk/benefit in terms of any risk injury if mucasa friable.      Seen on 01/19/2017 at 13:10 x 67 minutes at bedside with evaluation, history, use interpreter phone and exam.

## 2017-01-19 NOTE — Progress Notes (Signed)
Hospital Medicine Service Attending Progress Note    Significant Events/Subjective:   GI consulted given concern for possible ingestion of caustic substance given his complaints of throat pain and dysphagia. It is felt that in the absence of GI bleeding or there alarm symptoms, EGD would be purely diagnostic. Therefore patient should be managed with IV PPI BID and Carafate x2 weeks.   Patient endorses improvement in his symptoms. He states when he was drinking alcohol, he saw a bug on his bed and therefore got bug killer. When reaching to drink more alcohol he accidentally drank from the wrong bottle and began feeling burning. Does not remember what happened after that.    Objective:     Physical Exam  Temp:  [36 C (96.8 F)-36.7 C (98.1 F)] 36.1 C (97 F)  Heart Rate:  [56-77] 75  Resp:  [14-20] 16  BP: (92-116)/(52-71) 116/71     Gen: appears comfortable   HEENT: mmm, no oropharyngeal exudates or erythema on my exam  CV: normal s1s2, rrr  Pulm: CTAB  Abd: soft, mild epigastric tenderness improved from yesterday  Ext: no LE edema  Neuro/Psych: A&Ox3, no focal deficits  Skin/Lines: wwp      Recent Lab, Micro, and Imaging Studies   Personally reviewed and notable for:    T bili: 1.4 < 3.9    Current Inpatient Medications:   sucralfate  1 g Oral 4 times per day    pantoprazole  40 mg Intravenous Q12H    nicotine  1 patch Transdermal Daily    enoxaparin  40 mg Subcutaneous Daily    thiamine  100 mg Oral Daily    folic acid  1 mg Oral Daily    ranitidine  150 mg Oral 2 times per day     BMX (DiphenydrAMINE-Lidocaine-Maalox), phenol-menthol, naproxen    Assessment:     26YO [place] male with hx of GERD, etOH abuse found unresponsive by bystanders, with water bottle containing unknown substance, requiring intubation in the ED for airway protection given GCS 7 and admitted to MICU. He was extubated 6/12, and seen by toxicology, thought to be due to liquid ecstacy vs organophosphate vs copious etoh  ingestion    Plans:     #Toxic ingestion of unknown substance: most likely culprit is alcohol intoxication per tox and per patient, however, he is also endorsing consumption of "molly juice" and "bed bug juice." To me patient firmly states he accidentally grabbed the bottle with this "bed beg juice" thinking it was alcohol. He drank a little and when he felt it burn he realized he drank the wrong thing. I discussed if this was accidental or intentional, and he firmly states this was an accident. He denies any intentional self harm and wish to hurt himself. He states he lives with his parents and younger sister and has a large support system. There is concern for possible esophagitis from caustic ingestion, for which GI was consulted, however, his lack of alarm symptoms recommended a more conservative approach with medical management.   -appreciate toxicology assistance with management  -will continue with protonix IV bid, can likely transition to PO tomorrow  -carafate ATC x2 weeks  -BMX PRN    #chest pain: appears MSK in nature, as he describes reproducuble chest pain that worsens with movement and coughing  -will d/c naprosyn if there is suspicion for mucosal injury  -tylenol 1g PRN  -hot packs    DVT PPx: lovenox, encourage ambulation    Code Status:  FULL    Discharge Planning:   Est. Discharge Date (EDD): likely d/c tomorrow  Discharge Criteria/Barriers to Discharge: improvement in symptoms  PT and/or OT Recommendations/Discharge to: NA  Appointments Needed with: PCP    Case discussed with Liam Rogersebecca Hussar- NP, SW, RN    Spent >35 min, of which >50% spent in counseling and coordination of care.     Rica MastJessica Evia Goldsmith, MD on 01/19/2017 at 2:31 PM

## 2017-01-20 ENCOUNTER — Inpatient Hospital Stay: Payer: MEDICAID | Admitting: Gastroenterology

## 2017-01-20 DIAGNOSIS — T5491XA Toxic effect of unspecified corrosive substance, accidental (unintentional), initial encounter: Secondary | ICD-10-CM

## 2017-01-20 LAB — CHOLINESTERASE, RBC: Cholinesterase,RBC: 38 U/g{Hb} (ref 25–52)

## 2017-01-20 LAB — CBC
Hematocrit: 40 % (ref 40–51)
Hemoglobin: 13.4 g/dL — ABNORMAL LOW (ref 13.7–17.5)
MCH: 31 pg/cell (ref 26–32)
MCHC: 34 g/dL (ref 32–37)
MCV: 92 fL (ref 79–92)
Platelets: 175 10*3/uL (ref 150–330)
RBC: 4.3 MIL/uL — ABNORMAL LOW (ref 4.6–6.1)
RDW: 11.9 % (ref 11.6–14.4)
WBC: 7 10*3/uL (ref 4.2–9.1)

## 2017-01-20 LAB — COMPREHENSIVE METABOLIC PANEL
ALT: 22 U/L (ref 0–50)
AST: 23 U/L (ref 0–50)
Albumin: 4.3 g/dL (ref 3.5–5.2)
Alk Phos: 63 U/L (ref 40–130)
Anion Gap: 15 (ref 7–16)
Bilirubin,Total: 1.7 mg/dL — ABNORMAL HIGH (ref 0.0–1.2)
CO2: 24 mmol/L (ref 20–28)
Calcium: 9 mg/dL (ref 9.0–10.3)
Chloride: 103 mmol/L (ref 96–108)
Creatinine: 0.61 mg/dL — ABNORMAL LOW (ref 0.67–1.17)
GFR,Black: 158 *
GFR,Caucasian: 136 *
Glucose: 85 mg/dL (ref 60–99)
Lab: 4 mg/dL — ABNORMAL LOW (ref 6–20)
Potassium: 3.6 mmol/L (ref 3.3–5.1)
Sodium: 142 mmol/L (ref 133–145)
Total Protein: 6.4 g/dL (ref 6.3–7.7)

## 2017-01-20 LAB — PROTIME-INR
INR: 1 (ref 0.9–1.1)
Protime: 11.1 s (ref 10.0–12.9)

## 2017-01-20 LAB — PSEUDOCHOLINESTERASE: Pseudocholinesterase: 4528 U/L (ref 2900–7100)

## 2017-01-20 MED ORDER — BUTAMBEN-TETRACAINE-BENZOCAINE 2-2-14 % EX AERO *I*
INHALATION_SPRAY | CUTANEOUS | Status: AC | PRN
Start: 2017-01-20 — End: 2017-01-20
  Administered 2017-01-20: 1 via TOPICAL

## 2017-01-20 MED ORDER — ONDANSETRON HCL 4 MG PO TABS *I*
4.0000 mg | ORAL_TABLET | Freq: Three times a day (TID) | ORAL | Status: DC | PRN
Start: 2017-01-20 — End: 2017-01-21

## 2017-01-20 MED ORDER — MIDAZOLAM HCL 5 MG/5ML IJ SOLN *I*
INTRAMUSCULAR | Status: AC
Start: 2017-01-20 — End: 2017-01-20
  Filled 2017-01-20: qty 10

## 2017-01-20 MED ORDER — MIDAZOLAM HCL 5 MG/5ML IJ SOLN *I*
INTRAMUSCULAR | Status: AC | PRN
Start: 2017-01-20 — End: 2017-01-20
  Administered 2017-01-20 (×2): 2 mg via INTRAVENOUS
  Administered 2017-01-20: 1 mg via INTRAVENOUS
  Administered 2017-01-20: 2 mg via INTRAVENOUS

## 2017-01-20 MED ORDER — FENTANYL CITRATE 50 MCG/ML IJ SOLN *WRAPPED*
INTRAMUSCULAR | Status: AC
Start: 2017-01-20 — End: 2017-01-20
  Filled 2017-01-20: qty 4

## 2017-01-20 MED ORDER — FENTANYL CITRATE 50 MCG/ML IJ SOLN *WRAPPED*
INTRAMUSCULAR | Status: AC | PRN
Start: 2017-01-20 — End: 2017-01-20
  Administered 2017-01-20: 25 ug via INTRAVENOUS
  Administered 2017-01-20: 50 ug via INTRAVENOUS
  Administered 2017-01-20: 25 ug via INTRAVENOUS

## 2017-01-20 MED ORDER — RANITIDINE HCL 75 MG PO TABS *I*
150.0000 mg | ORAL_TABLET | Freq: Every evening | ORAL | Status: DC
Start: 2017-01-20 — End: 2017-01-21
  Administered 2017-01-20: 150 mg via ORAL
  Filled 2017-01-20 (×2): qty 2

## 2017-01-20 NOTE — Interval H&P Note (Signed)
UPDATES TO PATIENT'S CONDITION on the DAY OF SURGERY/PROCEDURE    I. Updates to Patient's Condition (to be completed by a provider privileged to complete a H&P, following reassessment of the patient by the provider):    Day of Surgery/Procedure Update:  History  History reviewed and no change    Physical  Physical exam updated and no change    Pre-Procedure Assessment  Airway Visibility: Posterior pharynx  Loose/Broken Teeth, Oral Piercings: Not Applicable  Lungs: Clear  Heart sounds: Rate: 77 bpm  Heart sounds rhythm: Regular Rate Rhythm  ASA Physical status rating: Class II: Mild systemic disease       II. Procedure Readiness   I have reviewed the patient's H&P and updated condition. By completing and signing this form, I attest that this patient is ready for surgery/procedure.    III. Attestation   I have reviewed the updated information regarding the patient's condition and it is appropriate to proceed with the planned surgery/procedure.    Brett ShanksJonathan Wasif Simonich, DO as of 8:15 AM 01/20/2017

## 2017-01-20 NOTE — Procedures (Signed)
EGD Procedure Note   Date of Procedure: 01/20/2017    Referring Physician: No ref. provider found   Primary Care Provider: Saddie BendersHough, David, MD   Attending Physician: Flo ShanksJonathan Malaiyah Achorn, DO  Fellow: None  Indication(s): Caustic ingestion    Medications: Fentanyl 100 mcg IV, Midazolam 7 mg IV and Cetacaine spray x1 were administered incrementally over the course of the procedure to achieve an adequate level of moderate sedation.   Moderate Sedation Face Times  Start Time: [id number]  End Time: 0840  Duration (minutes): 11 Minutes      Endoscope: [id number]  Accessories:None    Procedure Description: Full disclosure of risks were reviewed with patient as detailed on the consent form. The patient was placed in the left lateral decubitus position and monitored with continuous pulse oximetry, interval blood pressure monitoring and direct observations.   After adequate sedation, the endoscope was carefully introduced into the oropharynx and passed in to the esophagus under direct visualization. The esophagus and GE junction were carefully examined, including a measurement of the Z-line at 36 cm, GEJ at 36 cm and hiatus at 36 cm from the incisors. After advancement of the endoscope into the stomach, a careful examination was performed, including views of the antrum, incisura angularis, corpus and retroflexed views of the cardia and fundus.   The pylorus was then intubated without any difficulty and the endoscope was advanced to the second portion of the duodenum. Careful examination of the second portion of the duodenum and the bulb was then performed.  The stomach was then decompressed and the endoscope withdrawn.   Findings and intervention(s) are detailed below. The patient was recovered in the GI recovery area    Findings:     Esophagus:   Normal      Stomach:   Normal      Duodenum/small bowel:   Normal    Intervention(s):   None    Complication(s):   none    Impression(s):  1. Normal examination    Recommendation(s):   FU with  inpatient team    Histopathologic Diagnosis:   none    Flo ShanksJonathan Yomara Toothman, DO

## 2017-01-20 NOTE — Plan of Care (Signed)
Cognitive function    • Cognitive function will be maintained or return to baseline Maintaining        Mobility    • Patient's functional status is maintained or improved Maintaining        Nutrition    • Patient's nutritional status is maintained or improved Maintaining        Pain/Comfort    • Patient's pain or discomfort is manageable Maintaining        Psychosocial    • Demonstrates ability to cope with illness Maintaining        Safety    • Patient will remain free of falls Maintaining    • Prevent any intentional injury Maintaining
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
## 2017-01-21 DIAGNOSIS — R1310 Dysphagia, unspecified: Secondary | ICD-10-CM | POA: Diagnosis not present

## 2017-01-21 DIAGNOSIS — R0789 Other chest pain: Secondary | ICD-10-CM | POA: Diagnosis not present

## 2017-01-21 DIAGNOSIS — T6591XA Toxic effect of unspecified substance, accidental (unintentional), initial encounter: Secondary | ICD-10-CM

## 2017-01-21 MED ORDER — DIPHENHYDRAMINE-LIDOCAINE-MAALOX (BMX/FIRST MOUTHWASH) *WRAPPED*
15.0000 mL | ORAL | 0 refills | Status: AC | PRN
Start: 2017-01-21 — End: ?

## 2017-01-21 MED ORDER — RANITIDINE HCL 150 MG PO TABS *I*
150.0000 mg | ORAL_TABLET | Freq: Every evening | ORAL | 0 refills | Status: AC
Start: 2017-01-21 — End: ?

## 2017-01-21 NOTE — Plan of Care (Signed)
Cognitive function    • Cognitive function will be maintained or return to baseline Maintaining        Mobility    • Patient's functional status is maintained or improved Maintaining        Nutrition    • Patient's nutritional status is maintained or improved Maintaining        Pain/Comfort    • Patient's pain or discomfort is manageable Maintaining        Psychosocial    • Demonstrates ability to cope with illness Maintaining        Safety    • Patient will remain free of falls Maintaining    • Prevent any intentional injury Maintaining

## 2017-01-21 NOTE — Plan of Care (Signed)
Cognitive function    • Cognitive function will be maintained or return to baseline Goal not met        Mobility    • Patient's functional status is maintained or improved Goal not met        Nutrition    • Patient's nutritional status is maintained or improved Goal not met        Pain/Comfort    • Patient's pain or discomfort is manageable Goal not met        Psychosocial    • Demonstrates ability to cope with illness Goal not met        Safety    • Patient will remain free of falls Goal not met    • Prevent any intentional injury Goal not met

## 2017-01-21 NOTE — Progress Notes (Signed)
Pt is being discharge to home via own ride with his significant other. Pt has no PIV in place. Reviewed AVS with Pt and answered all questions. Pt left unit around 1245 via wheelchair with all belongings in possession.    Sheran LawlessMahmood S Nykiah Ma, RN

## 2017-01-21 NOTE — Discharge Instructions (Signed)
Brief Summary of Your Hospital Course:  You were found unresponsive and brought to the hospital after you drank a toxic substance. You were placed on a respirator in the ICU.  When you woke up, the breathing tube was removed.  The GI doctors looked in your throat and into your stomach and it was normal.       What to do after you leave the hospital:  -drink a lot of water, juice or other fluids  -do not drink any alcohol  -do not use any illegal drugs  -take Ranitidine every night  -you can take BMX before meals if you have trouble swallowing  -you have an appointment with your doctor on Monday      Recommended diet: Regular - No restrictions    Recommended activity: activity as tolerated      If you experience any of these symptoms 24 hours or more after discharge:Uncontrolled pain, Chest pain, Shortness of breath, Fever of 101 F. or greater, Chills, Poor appetite, Poor urinary output, Vomiting, Nausea, Diarrhea, Blood in stool or Weakness  Call Dr. Esaw DaceHough    If you experience any of these symptoms within the first 24 hours after discharge call Dr.  Phill MyronGiugno  at [phone]    Smoking  Smoking can increase your chances of developing chronic health problems or worsen conditions you already have.  If you smoke you should quit. Smoking cessation information has been given to you for your review to help you quit.  Medications to help you quit are available.  Ask your doctor if you would like to receive these medications.     Medications  Your doctor has prescribed medications to improve or manage your condition.  You should take them as prescribed by your doctor.  Ask your doctor for any questions regarding these medications.    Diet  A healthy diet is important to help you stay well.  Some health conditions require you to be on a special diet.  You should monitor your fluid intake and limit the amount of sodium including table salt.  This will help you avoid fluid retention which can cause shortness of breath or swelling of  the feet and ankles.  Reading food labels is helpful when you are on a special diet.  Follow instructions from your doctor for any other special dietary requirement.    Exercise/Activity  Activity and exercise are important to your well being.  While you are in the hospital your activity may be restricted.  As your condition improves your activity level will be increased.  Most patients will be able to gradually resume activity as before.  You should follow your doctors activity recommendations      What to do if your condition changes?  Once you leave the hospital you should contact your doctors office to make a follow up appointment.  If at any time you have any questions or concerns or your condition gets worse, contact your physician.  If you can not reach your physician or you develop life threatening symptoms such as trouble breathing or chest pain you should go to the closest Emergency Department.

## 2017-01-21 NOTE — Progress Notes (Signed)
Notified by 01-1599, Misty StanleyLisa, that patient is being discharged today, 01/21/17, but he does not have insurance coverage to pay for his Rxs. Per chart review, [facility]. Patient does not currently have a phone number listed on his demographics (01-1599 will send to admitting to be updated), but he can be reached on [phone]; [facility] with this information.       [facility], [facility], and provided verbal approval to voucher discharge medications. Voucher below.     Plan: SW Pharmacy Voucher to be used for cost of Rxs today   [facility] to follow-up with patient after discharge at phone number [phone]   SW remains available as needs arise    Hilton CorkLinnaea Mak Bonny, LMSW  Outpatient Neurology Social Work  [phone]  Pager [phone]    [facility] SOCIAL WORK  PHARMACY FORM     Todays date:  January 21, 2017    Patient Name: Brett Murphy      Medical Record #: [id number]   DOB: 06/17/1991  Patients Address: [address], [address]                Social Worker: Hilton CorkLinnaea Gabby Rackers, LMSW       Date of Service: January 21, 2017       Funding Source: Medicaid Pending  ___________________________________________________________________    Pharmacy Information:  Date/time sent: January 21, 2017     Time needed: ASAP    Patient Location: Outpatient    Medication Pick-up Preference: Patient will pick up at the pharmacy    Pharmacy Contact: Merry ProudBrandi  Social work signature: Hilton CorkLinnaea Herma Uballe, LMSW  Supervisor/Manager Approval (if indicated):  Date:  Museum/gallery exhibitions officer(supervisor signature not required for Medicaid pending)
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
## 2017-01-25 LAB — GAMMA HYDROXYBUTYRIC ACID,URINE
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
## 2017-01-28 ENCOUNTER — Ambulatory Visit (HOSPITAL_COMMUNITY)
Admission: EM | Admit: 2017-01-28 | Discharge: 2017-01-28 | Disposition: A | Discharge status: Home / Self Care | Payer: BLUE CROSS/BLUE SHIELD | Attending: Family Medicine | Admitting: Family Medicine

## 2017-01-28 ENCOUNTER — Encounter (HOSPITAL_COMMUNITY): Payer: Self-pay | Admitting: Emergency Medicine

## 2017-01-28 DIAGNOSIS — R21 Rash and other nonspecific skin eruption: Secondary | ICD-10-CM

## 2017-01-28 DIAGNOSIS — T7840XA Allergy, unspecified, initial encounter: Secondary | ICD-10-CM

## 2017-01-28 MED ORDER — METHYLPREDNISOLONE 4 MG PO TBPK: ORAL_TABLET | ORAL | 0 refills | Status: DC

## 2017-01-28 MED ORDER — METHYLPREDNISOLONE SODIUM SUCC 125 MG IJ SOLR
INTRAMUSCULAR | Status: AC
  Filled 2017-01-28: qty 2

## 2017-01-28 MED ORDER — METHYLPREDNISOLONE SODIUM SUCC 125 MG IJ SOLR
125.0000 mg | Freq: Once | INTRAMUSCULAR | Status: AC
  Administered 2017-01-28: 125 mg via INTRAMUSCULAR

## 2017-01-28 MED ORDER — HYDROXYZINE HCL 25 MG PO TABS: 25.0000 mg | ORAL_TABLET | Freq: Four times a day (QID) | ORAL | 0 refills | Status: DC
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
## 2017-01-28 NOTE — ED Triage Notes (Signed)
Pt. Stated, I think I have reaction to medicine I have itching and rash all over. ? medication

## 2017-05-03 ENCOUNTER — Ambulatory Visit
Admission: RE | Admit: 2017-05-03 | Discharge: 2017-05-03 | Disposition: A | Discharge status: Home / Self Care | Payer: PRIVATE HEALTH INSURANCE | Source: Ambulatory Visit | Attending: Internal Medicine | Admitting: Internal Medicine

## 2017-05-03 ENCOUNTER — Other Ambulatory Visit: Payer: Self-pay | Admitting: Internal Medicine

## 2017-05-03 DIAGNOSIS — A150 Tuberculosis of lung: Secondary | ICD-10-CM

## 2017-07-08 DIAGNOSIS — T8484XA Pain due to internal orthopedic prosthetic devices, implants and grafts, initial encounter: Secondary | ICD-10-CM

## 2017-07-08 HISTORY — DX: Pain due to internal orthopedic prosthetic devices, implants and grafts, initial encounter: T84.84XA

## 2017-07-12 ENCOUNTER — Other Ambulatory Visit: Payer: Self-pay | Admitting: Orthopedic Surgery

## 2017-07-21 ENCOUNTER — Other Ambulatory Visit: Payer: Self-pay

## 2017-07-21 ENCOUNTER — Encounter (HOSPITAL_BASED_OUTPATIENT_CLINIC_OR_DEPARTMENT_OTHER): Payer: Self-pay | Admitting: *Deleted

## 2017-07-27 ENCOUNTER — Ambulatory Visit (HOSPITAL_BASED_OUTPATIENT_CLINIC_OR_DEPARTMENT_OTHER): Payer: BLUE CROSS/BLUE SHIELD | Admitting: Anesthesiology

## 2017-07-27 ENCOUNTER — Ambulatory Visit (HOSPITAL_BASED_OUTPATIENT_CLINIC_OR_DEPARTMENT_OTHER)
Admission: RE | Admit: 2017-07-27 | Discharge: 2017-07-27 | Disposition: A | Discharge status: Home / Self Care | Payer: BLUE CROSS/BLUE SHIELD | Source: Ambulatory Visit | Attending: Orthopedic Surgery | Admitting: Orthopedic Surgery

## 2017-07-27 ENCOUNTER — Encounter (HOSPITAL_BASED_OUTPATIENT_CLINIC_OR_DEPARTMENT_OTHER)
Admission: RE | Disposition: A | Discharge status: Home / Self Care | Payer: Self-pay | Source: Ambulatory Visit | Attending: Orthopedic Surgery

## 2017-07-27 ENCOUNTER — Encounter (HOSPITAL_BASED_OUTPATIENT_CLINIC_OR_DEPARTMENT_OTHER): Payer: Self-pay | Admitting: Anesthesiology

## 2017-07-27 DIAGNOSIS — Z969 Presence of functional implant, unspecified: Secondary | ICD-10-CM

## 2017-07-27 DIAGNOSIS — T8484XA Pain due to internal orthopedic prosthetic devices, implants and grafts, initial encounter: Secondary | ICD-10-CM | POA: Insufficient documentation

## 2017-07-27 DIAGNOSIS — Y831 Surgical operation with implant of artificial internal device as the cause of abnormal reaction of the patient, or of later complication, without mention of misadventure at the time of the procedure: Secondary | ICD-10-CM | POA: Diagnosis not present

## 2017-07-27 DIAGNOSIS — Z87891 Personal history of nicotine dependence: Secondary | ICD-10-CM | POA: Diagnosis not present

## 2017-07-27 HISTORY — PX: HARDWARE REMOVAL: SHX979

## 2017-07-27 HISTORY — DX: Pain due to internal orthopedic prosthetic devices, implants and grafts, initial encounter: T84.84XA

## 2017-07-27 SURGERY — REMOVAL, HARDWARE
Anesthesia: General | Site: Leg Lower | Laterality: Left

## 2017-07-27 MED ORDER — CEFAZOLIN SODIUM-DEXTROSE 2-4 GM/100ML-% IV SOLN
INTRAVENOUS | Status: AC
  Filled 2017-07-27: qty 100

## 2017-07-27 MED ORDER — MIDAZOLAM HCL 2 MG/2ML IJ SOLN
1.0000 mg | INTRAMUSCULAR | Status: DC | PRN
  Administered 2017-07-27: 2 mg via INTRAVENOUS

## 2017-07-27 MED ORDER — OXYCODONE HCL 5 MG PO TABS
5.0000 mg | ORAL_TABLET | Freq: Once | ORAL | Status: AC | PRN
  Administered 2017-07-27: 5 mg via ORAL

## 2017-07-27 MED ORDER — LACTATED RINGERS IV SOLN
INTRAVENOUS | Status: DC
  Administered 2017-07-27: 07:00:00 via INTRAVENOUS

## 2017-07-27 MED ORDER — SCOPOLAMINE 1 MG/3DAYS TD PT72: 1.0000 | MEDICATED_PATCH | Freq: Once | TRANSDERMAL | Status: DC | PRN

## 2017-07-27 MED ORDER — LIDOCAINE 2% (20 MG/ML) 5 ML SYRINGE
INTRAMUSCULAR | Status: AC
  Filled 2017-07-27: qty 5

## 2017-07-27 MED ORDER — BUPIVACAINE-EPINEPHRINE (PF) 0.5% -1:200000 IJ SOLN
INTRAMUSCULAR | Status: AC
  Filled 2017-07-27: qty 60

## 2017-07-27 MED ORDER — PROMETHAZINE HCL 25 MG/ML IJ SOLN: 6.2500 mg | INTRAMUSCULAR | Status: DC | PRN

## 2017-07-27 MED ORDER — FENTANYL CITRATE (PF) 100 MCG/2ML IJ SOLN
50.0000 ug | INTRAMUSCULAR | Status: DC | PRN
  Administered 2017-07-27: 100 ug via INTRAVENOUS

## 2017-07-27 MED ORDER — KETOROLAC TROMETHAMINE 30 MG/ML IJ SOLN
INTRAMUSCULAR | Status: DC | PRN
  Administered 2017-07-27: 30 mg via INTRAVENOUS

## 2017-07-27 MED ORDER — OXYCODONE HCL 5 MG PO TABS
ORAL_TABLET | ORAL | Status: AC
  Filled 2017-07-27: qty 1

## 2017-07-27 MED ORDER — CEFAZOLIN SODIUM-DEXTROSE 2-4 GM/100ML-% IV SOLN
2.0000 g | INTRAVENOUS | Status: AC
  Administered 2017-07-27: 2 g via INTRAVENOUS

## 2017-07-27 MED ORDER — 0.9 % SODIUM CHLORIDE (POUR BTL) OPTIME
TOPICAL | Status: DC | PRN
  Administered 2017-07-27: 150 mL

## 2017-07-27 MED ORDER — SODIUM CHLORIDE 0.9 % IV SOLN: INTRAVENOUS | Status: DC

## 2017-07-27 MED ORDER — ONDANSETRON HCL 4 MG/2ML IJ SOLN
INTRAMUSCULAR | Status: AC
  Filled 2017-07-27: qty 2

## 2017-07-27 MED ORDER — FENTANYL CITRATE (PF) 100 MCG/2ML IJ SOLN
INTRAMUSCULAR | Status: AC
  Filled 2017-07-27: qty 2

## 2017-07-27 MED ORDER — CHLORHEXIDINE GLUCONATE 4 % EX LIQD: 60.0000 mL | Freq: Once | CUTANEOUS | Status: DC

## 2017-07-27 MED ORDER — OXYCODONE HCL 5 MG/5ML PO SOLN: 5.0000 mg | Freq: Once | ORAL | Status: AC | PRN

## 2017-07-27 MED ORDER — LIDOCAINE 2% (20 MG/ML) 5 ML SYRINGE
INTRAMUSCULAR | Status: DC | PRN
  Administered 2017-07-27: 100 mg via INTRAVENOUS

## 2017-07-27 MED ORDER — PROPOFOL 10 MG/ML IV BOLUS
INTRAVENOUS | Status: DC | PRN
  Administered 2017-07-27: 200 mg via INTRAVENOUS

## 2017-07-27 MED ORDER — TRAMADOL HCL 50 MG PO TABS: 50.0000 mg | ORAL_TABLET | Freq: Four times a day (QID) | ORAL | 0 refills | Status: DC | PRN

## 2017-07-27 MED ORDER — DEXAMETHASONE SODIUM PHOSPHATE 10 MG/ML IJ SOLN
INTRAMUSCULAR | Status: AC
  Filled 2017-07-27: qty 1

## 2017-07-27 MED ORDER — PROPOFOL 500 MG/50ML IV EMUL
INTRAVENOUS | Status: AC
  Filled 2017-07-27: qty 50

## 2017-07-27 MED ORDER — ONDANSETRON HCL 4 MG/2ML IJ SOLN
INTRAMUSCULAR | Status: DC | PRN
  Administered 2017-07-27: 4 mg via INTRAVENOUS

## 2017-07-27 MED ORDER — DEXAMETHASONE SODIUM PHOSPHATE 10 MG/ML IJ SOLN
INTRAMUSCULAR | Status: DC | PRN
  Administered 2017-07-27: 10 mg via INTRAVENOUS

## 2017-07-27 MED ORDER — BUPIVACAINE-EPINEPHRINE 0.5% -1:200000 IJ SOLN
INTRAMUSCULAR | Status: DC | PRN
  Administered 2017-07-27: 10 mL

## 2017-07-27 MED ORDER — HYDROMORPHONE HCL 1 MG/ML IJ SOLN: 0.2500 mg | INTRAMUSCULAR | Status: DC | PRN

## 2017-07-27 MED ORDER — MIDAZOLAM HCL 2 MG/2ML IJ SOLN
INTRAMUSCULAR | Status: AC
  Filled 2017-07-27: qty 2

## 2017-07-27 SURGICAL SUPPLY — 63 items
BANDAGE ACE 4X5 VEL STRL LF (GAUZE/BANDAGES/DRESSINGS) ×2 IMPLANT
BANDAGE ACE 6X5 VEL STRL LF (GAUZE/BANDAGES/DRESSINGS) ×2 IMPLANT
BANDAGE ESMARK 6X9 LF (GAUZE/BANDAGES/DRESSINGS) IMPLANT
BENZOIN TINCTURE PRP APPL 2/3 (GAUZE/BANDAGES/DRESSINGS) IMPLANT
BLADE SURG 15 STRL LF DISP TIS (BLADE) ×2 IMPLANT
BLADE SURG 15 STRL SS (BLADE) ×2
BNDG COHESIVE 4X5 TAN STRL (GAUZE/BANDAGES/DRESSINGS) IMPLANT
BNDG COHESIVE 6X5 TAN STRL LF (GAUZE/BANDAGES/DRESSINGS) IMPLANT
BNDG ESMARK 4X9 LF (GAUZE/BANDAGES/DRESSINGS) IMPLANT
BNDG ESMARK 6X9 LF (GAUZE/BANDAGES/DRESSINGS)
CHLORAPREP W/TINT 26ML (MISCELLANEOUS) ×2 IMPLANT
COVER BACK TABLE 60X90IN (DRAPES) ×2 IMPLANT
CUFF TOURNIQUET SINGLE 34IN LL (TOURNIQUET CUFF) IMPLANT
DECANTER SPIKE VIAL GLASS SM (MISCELLANEOUS) IMPLANT
DRAPE EXTREMITY T 121X128X90 (DRAPE) ×2 IMPLANT
DRAPE OEC MINIVIEW 54X84 (DRAPES) ×2 IMPLANT
DRAPE SURG 17X23 STRL (DRAPES) IMPLANT
DRAPE U-SHAPE 47X51 STRL (DRAPES) ×2 IMPLANT
DRSG MEPITEL 4X7.2 (GAUZE/BANDAGES/DRESSINGS) ×2 IMPLANT
DRSG PAD ABDOMINAL 8X10 ST (GAUZE/BANDAGES/DRESSINGS) IMPLANT
ELECT REM PT RETURN 9FT ADLT (ELECTROSURGICAL) ×2
ELECTRODE REM PT RTRN 9FT ADLT (ELECTROSURGICAL) ×1 IMPLANT
GAUZE SPONGE 4X4 12PLY STRL (GAUZE/BANDAGES/DRESSINGS) ×2 IMPLANT
GLOVE BIO SURGEON STRL SZ8 (GLOVE) ×2 IMPLANT
GLOVE BIOGEL PI IND STRL 7.0 (GLOVE) ×2 IMPLANT
GLOVE BIOGEL PI IND STRL 8 (GLOVE) ×2 IMPLANT
GLOVE BIOGEL PI INDICATOR 7.0 (GLOVE) ×2
GLOVE BIOGEL PI INDICATOR 8 (GLOVE) ×2
GLOVE ECLIPSE 6.5 STRL STRAW (GLOVE) ×2 IMPLANT
GLOVE ECLIPSE 8.0 STRL XLNG CF (GLOVE) ×2 IMPLANT
GOWN STRL REUS W/ TWL LRG LVL3 (GOWN DISPOSABLE) ×1 IMPLANT
GOWN STRL REUS W/ TWL XL LVL3 (GOWN DISPOSABLE) ×2 IMPLANT
GOWN STRL REUS W/TWL LRG LVL3 (GOWN DISPOSABLE) ×1
GOWN STRL REUS W/TWL XL LVL3 (GOWN DISPOSABLE) ×2
NEEDLE HYPO 22GX1.5 SAFETY (NEEDLE) ×2 IMPLANT
PACK BASIN DAY SURGERY FS (CUSTOM PROCEDURE TRAY) ×2 IMPLANT
PAD CAST 4YDX4 CTTN HI CHSV (CAST SUPPLIES) ×1 IMPLANT
PADDING CAST ABS 4INX4YD NS (CAST SUPPLIES)
PADDING CAST ABS COTTON 4X4 ST (CAST SUPPLIES) IMPLANT
PADDING CAST COTTON 4X4 STRL (CAST SUPPLIES) ×1
PADDING CAST COTTON 6X4 STRL (CAST SUPPLIES) ×2 IMPLANT
PENCIL BUTTON HOLSTER BLD 10FT (ELECTRODE) ×2 IMPLANT
SANITIZER HAND PURELL 535ML FO (MISCELLANEOUS) ×2 IMPLANT
SHEET MEDIUM DRAPE 40X70 STRL (DRAPES) ×2 IMPLANT
SLEEVE SCD COMPRESS KNEE MED (MISCELLANEOUS) ×2 IMPLANT
SPLINT FAST PLASTER 5X30 (CAST SUPPLIES)
SPLINT PLASTER CAST FAST 5X30 (CAST SUPPLIES) IMPLANT
SPONGE LAP 18X18 X RAY DECT (DISPOSABLE) ×2 IMPLANT
STOCKINETTE 6  STRL (DRAPES) ×1
STOCKINETTE 6 STRL (DRAPES) ×1 IMPLANT
STRIP CLOSURE SKIN 1/2X4 (GAUZE/BANDAGES/DRESSINGS) IMPLANT
SUCTION FRAZIER HANDLE 10FR (MISCELLANEOUS) ×1
SUCTION TUBE FRAZIER 10FR DISP (MISCELLANEOUS) ×1 IMPLANT
SUT ETHILON 3 0 PS 1 (SUTURE) ×2 IMPLANT
SUT MNCRL AB 3-0 PS2 18 (SUTURE) ×2 IMPLANT
SUT VIC AB 0 SH 27 (SUTURE) IMPLANT
SUT VIC AB 2-0 SH 27 (SUTURE)
SUT VIC AB 2-0 SH 27XBRD (SUTURE) IMPLANT
SYR BULB 3OZ (MISCELLANEOUS) ×2 IMPLANT
SYR CONTROL 10ML LL (SYRINGE) ×2 IMPLANT
TOWEL OR 17X24 6PK STRL BLUE (TOWEL DISPOSABLE) ×2 IMPLANT
TUBE CONNECTING 20X1/4 (TUBING) ×2 IMPLANT
UNDERPAD 30X30 (UNDERPADS AND DIAPERS) ×2 IMPLANT

## 2017-07-27 NOTE — Anesthesia Preprocedure Evaluation (Addendum)
Anesthesia Evaluation  Patient identified by MRN, date of birth, ID band Patient awake    Reviewed: Allergy & Precautions, NPO status , Patient's Chart, lab work & pertinent test results  Airway Mallampati: II  TM Distance: >3 FB Neck ROM: Full    Dental no notable dental hx.    Pulmonary former smoker,    Pulmonary exam normal breath sounds clear to auscultation       Cardiovascular negative cardio ROS Normal cardiovascular exam Rhythm:Regular Rate:Normal     Neuro/Psych negative neurological ROS  negative psych ROS   GI/Hepatic negative GI ROS, Neg liver ROS,   Endo/Other  negative endocrine ROS  Renal/GU negative Renal ROS     Musculoskeletal   Abdominal   Peds  Hematology negative hematology ROS (+)   Anesthesia Other Findings Painful hardware right proximal and distal tibia  Reproductive/Obstetrics                             Anesthesia Physical  Anesthesia Plan  ASA: II  Anesthesia Plan: General   Post-op Pain Management:    Induction: Intravenous  PONV Risk Score and Plan: 2 and Treatment may vary due to age or medical condition, Ondansetron, Dexamethasone and Midazolam  Airway Management Planned: LMA  Additional Equipment:   Intra-op Plan:   Post-operative Plan: Extubation in OR  Informed Consent: I have reviewed the patients History and Physical, chart, labs and discussed the procedure including the risks, benefits and alternatives for the proposed anesthesia with the patient or authorized representative who has indicated his/her understanding and acceptance.   Dental advisory given  Plan Discussed with: CRNA  Anesthesia Plan Comments:         Anesthesia Quick Evaluation

## 2017-07-27 NOTE — H&P (Signed)
Troy Bishop is an 26 y.o. male.   Chief Complaint: Left leg pain HPI: The patient is a 26 year old male who sustained a left tibia fracture 5 years ago.  He has healed the fracture but complains of pain at the screw heads at his medial leg.  He desires removal of these painful screws.  He presents today for surgical treatment.  Past Medical History:  Diagnosis Date  . Painful orthopaedic hardware [facility](HCC) 07/2017   right tibia    Past Surgical History:  Procedure Laterality Date  . BREATH TEK H PYLORI N/A 09/08/2016   Procedure: BREATH TEK H PYLORI;  Surgeon: Sherrilyn RistHenry L Danis III, MD;  [facility];  Service: Gastroenterology;  Laterality: N/A;  . TENDON REPAIR Left 08/14/2014   Procedure: LEFT HAND WOUND EXPLORATION AND TENDON REPAIR;  Surgeon: Sharma CovertFred W Ortmann, MD;  [facility];  Service: Orthopedics;  Laterality: Left;  . TIBIA IM NAIL INSERTION  07/31/2012   Procedure: INTRAMEDULLARY (IM) NAIL TIBIAL;  Surgeon: Toni ArthursJohn Tea Collums, MD;  Location: [facility];  Service: Orthopedics;  Laterality: Left;  . UPPER GI ENDOSCOPY  07/25/2016    History reviewed. No pertinent family history. Social History:  reports that he quit smoking about 3 years ago. he has never used smokeless tobacco. He reports that he drinks alcohol. He reports that he does not use drugs.  Allergies: No Known Allergies  No medications prior to admission.    No results found for this or any previous visit (from the past 48 hour(s)). No results found.  ROS no recent fever, chills, nausea, vomiting or changes in his appetite. Blood pressure 124/84, pulse 67, temperature 98.2 F (36.8 C), temperature source Oral, resp. rate 18, height 5' 5.5" (1.664 m), weight 71 kg (156 lb 9.6 oz), SpO2 100 %. Physical Exam  Well-nourished well-developed young man in no apparent distress.  Alert and oriented x4.  Mood and affect are normal.  Extraocular motions are intact.  Respirations are unlabored.  His gait is normal.   Left leg has palpable hardware at the distal medial tibia as well as proximal medial tibia.  5 out of 5 strength in plantar flexion and dorsi flexion of the ankle.  No lymphadenopathy.  Skin is otherwise healthy and intact.  No signs of infection.  X-rays: Plain films show interlock screws that are intact and a healed tibia fracture. Assessment/Plan Painful hardware left leg-to operating room today for removal of the painful screws.  The risks and benefits of the alternative treatment options have been discussed in detail.  The patient wishes to proceed with surgery and specifically understands risks of bleeding, infection, nerve damage, blood clots, need for additional surgery, amputation and death.   [name], [name], MD 07/27/2017, 7:27 AM

## 2017-07-27 NOTE — Op Note (Signed)
07/27/2017  8:49 AM  PATIENT:  Troy SpeakAshok Marmo  26 y.o. male  PRE-OPERATIVE DIAGNOSIS:  Painful hardware left proximal and distal tibia  POST-OPERATIVE DIAGNOSIS:  Painful hardware left proximal and distal tibia  Procedure(s): 1.  Removal of deep implants left proximal tibia 2.  Removal of deep implants from the left distal tibia through a separate incision 3.  AP and lateral xrays of the left  SURGEON:  Toni ArthursJohn Majestic Molony, MD  ASSISTANT: Alfredo MartinezJustin Ollis, PA-C  ANESTHESIA:   General  EBL:  minimal   TOURNIQUET:  n/a  COMPLICATIONS:  None apparent  DISPOSITION:  Extubated, awake and stable to recovery.  INDICATION FOR PROCEDURE: The patient is a 26 year old male who is 5 years out now from open treatment of his left open tibia fracture with intramedullary nailing.  He complains of pain at the screws proximally and distally at his left leg.  He has failed nonoperative treatment to date and presents today for removal of these deep implants.  The risks and benefits of the alternative treatment options have been discussed in detail.  The patient wishes to proceed with surgery and specifically understands risks of bleeding, infection, nerve damage, blood clots, need for additional surgery, amputation and death.  PROCEDURE IN DETAIL:After pre operative consent was obtained, and the correct operative site was identified, the patient was brought to the operating room and placed supine on the OR table.  Anesthesia was administered.  Pre-operative antibiotics were administered.  A surgical timeout was taken.  The left lower extremity was prepped and draped in standard sterile fashion.  All 4 incision sites were infiltrated with half percent Marcaine with epinephrine.  The distalmost incision was opened again sharply and dissection was carried down through the simultaneous tissues.  The screw head was identified.  The screw was removed in its entirety.  The next screw was identified a few centimeters proximal  from the distal most screw.  The incision was made and dissection was carried down through the subtendinous tissues to the screw head.  The screw was removed in its entirety.  Both wounds were irrigated and closed with nylon.  Attention was then turned to the proximal aspect of the leg with a medial incision was identified.  It was made sharply and dissection was carried down through this obtains tissues to the screw head.  The screw head was cleared of all fibrous tissue and the screw was removed in its entirety.  Attention was turned to the proximal lateral aspect of the leg.  The previous incision was again opened and dissection was carried down through the subtenons tissues to the screw head.  The screw was removed in its entirety.  Both proximal wounds were irrigated and closed with nylon.  AP and lateral radiographs of the tibia and fibula were obtained proximally and distally.  These show complete removal of all 4 interlocking screws.  The tibial fracture appears completely healed.  Patient was then awakened by anesthesia and transported to the recovery room in stable condition.   FOLLOW UP PLAN: Weightbearing as tolerated on the left lower extremity.  Follow-up in 2 weeks for suture removal.   RADIOGRAPHS: AP and lateral radiographs of the left tibia and fibula are obtained intraoperatively.  These show interval removal of 2 interlocking screws from the proximal end of the tibial nail and 2 interlocking screws from the distal end of the tibial nail.  No other acute injuries are noted.  The tibia fracture appears completely healed.    Jill AlexandersJustin  Ollis PA-C was present and scrubbed for the duration of the operative case. His assistance assistance was essential in positioning the patient, prepping and draping, gaining maintaining exposure, performing the operation, closing and dressing the wounds and applying the splint.

## 2017-07-27 NOTE — Discharge Instructions (Addendum)
Pt took OXY 5MG  AT 10:00. CAN TAKE RX @ 4PM    Toni ArthursJohn Hewitt, MD [facility]  Please read the following information regarding your care after surgery.  Medications  You only need a prescription for the narcotic pain medicine (ex. oxycodone, Percocet, Norco).  All of the other medicines listed below are available over the counter. X Aleve 2 pills twice a day for the first 3 days after surgery. X acetominophen (Tylenol) 650 mg every 4-6 hours as you need for minor to moderate pain X tramadol as prescribed for severe pain  Weight Bearing X Bear weight when you are able on your operated leg or foot.   Cast / Splint / Dressing X Remove your dressing 3 days after surgery and cover the incisions with dry dressings.    After your dressing, cast or splint is removed; you may shower, but do not soak or scrub the wound.  Allow the water to run over it, and then gently pat it dry.  Swelling It is normal for you to have swelling where you had surgery.  To reduce swelling and pain, keep your toes above your nose for at least 3 days after surgery.  It may be necessary to keep your foot or leg elevated for several weeks.  If it hurts, it should be elevated.  Follow Up Call my office at [phone] when you are discharged from the hospital or surgery center to schedule an appointment to be seen two weeks after surgery.  Call my office at [phone] if you develop a fever >101.5 F, nausea, vomiting, bleeding from the surgical site or severe pain.        Post Anesthesia Home Care Instructions  Activity: Get plenty of rest for the remainder of the day. A responsible individual must stay with you for 24 hours following the procedure.  For the next 24 hours, DO NOT: -Drive a car -Advertising copywriterperate machinery -Drink alcoholic beverages -Take any medication unless instructed by your physician -Make any legal decisions or sign important papers.  Meals: Start with liquid foods such as gelatin  or soup. Progress to regular foods as tolerated. Avoid greasy, spicy, heavy foods. If nausea and/or vomiting occur, drink only clear liquids until the nausea and/or vomiting subsides. Call your physician if vomiting continues.  Special Instructions/Symptoms: Your throat may feel dry or sore from the anesthesia or the breathing tube placed in your throat during surgery. If this causes discomfort, gargle with warm salt water. The discomfort should disappear within 24 hours.  If you had a scopolamine patch placed behind your ear for the management of post- operative nausea and/or vomiting:  1. The medication in the patch is effective for 72 hours, after which it should be removed.  Wrap patch in a tissue and discard in the trash. Wash hands thoroughly with soap and water. 2. You may remove the patch earlier than 72 hours if you experience unpleasant side effects which may include dry mouth, dizziness or visual disturbances. 3. Avoid touching the patch. Wash your hands with soap and water after contact with the patch.

## 2017-07-27 NOTE — Transfer of Care (Signed)
Immediate Anesthesia Transfer of Care Note  Patient: Troy Bishop  Procedure(s) Performed: Removal of deep implants right proximal and distal tibia (Left Leg Lower)  Patient Location: PACU  Anesthesia Type:General  Level of Consciousness: sedated  Airway & Oxygen Therapy: Patient Spontanous Breathing and Patient connected to face mask oxygen  Post-op Assessment: Report given to RN and Post -op Vital signs reviewed and stable  Post vital signs: Reviewed and stable  Last Vitals:  Vitals:   07/27/17 0845 07/27/17 0846  BP: 107/62   Pulse: 75 72  Resp: 10 13  Temp:    SpO2: 100% 100%    Last Pain:  Vitals:   07/27/17 0718  TempSrc: Oral  PainSc: 0-No pain      Patients Stated Pain Goal: 0 (07/27/17 0718)  Complications: No apparent anesthesia complications

## 2017-07-27 NOTE — Anesthesia Procedure Notes (Signed)
Procedure Name: LMA Insertion Date/Time: 07/27/2017 8:09 AM Performed by: Burna Cashonrad, Tremell Reimers C, CRNA Pre-anesthesia Checklist: Patient identified, Emergency Drugs available, Suction available and Patient being monitored Patient Re-evaluated:Patient Re-evaluated prior to induction Oxygen Delivery Method: Circle system utilized Preoxygenation: Pre-oxygenation with 100% oxygen Induction Type: IV induction Ventilation: Mask ventilation without difficulty LMA: LMA inserted LMA Size: 4.0 Number of attempts: 1 Airway Equipment and Method: Bite block Placement Confirmation: positive ETCO2 Tube secured with: Tape Dental Injury: Teeth and Oropharynx as per pre-operative assessment

## 2017-07-27 NOTE — Anesthesia Postprocedure Evaluation (Signed)
Anesthesia Post Note  Patient: Troy Bishop  Procedure(s) Performed: Removal of deep implants right proximal and distal tibia (Left Leg Lower)     Patient location during evaluation: PACU Anesthesia Type: General Level of consciousness: awake and alert Pain management: pain level controlled Vital Signs Assessment: post-procedure vital signs reviewed and stable Respiratory status: spontaneous breathing, nonlabored ventilation, respiratory function stable and patient connected to nasal cannula oxygen Cardiovascular status: blood pressure returned to baseline and stable Postop Assessment: no apparent nausea or vomiting Anesthetic complications: no    Last Vitals:  Vitals:   07/27/17 0930 07/27/17 0951  BP: (!) 128/93 121/88  Pulse: 80 61  Resp: (!) 22 16  Temp:  36.5 C  SpO2: 100% 100%    Last Pain:  Vitals:   07/27/17 1009  TempSrc:   PainSc: 2                  Ryan P Ellender

## 2017-07-28 ENCOUNTER — Encounter (HOSPITAL_BASED_OUTPATIENT_CLINIC_OR_DEPARTMENT_OTHER): Payer: Self-pay | Admitting: Orthopedic Surgery

## 2017-07-31 ENCOUNTER — Encounter (HOSPITAL_COMMUNITY): Payer: Self-pay | Admitting: Emergency Medicine

## 2017-07-31 ENCOUNTER — Ambulatory Visit (INDEPENDENT_AMBULATORY_CARE_PROVIDER_SITE_OTHER): Payer: BLUE CROSS/BLUE SHIELD

## 2017-07-31 ENCOUNTER — Ambulatory Visit (HOSPITAL_COMMUNITY)
Admission: EM | Admit: 2017-07-31 | Discharge: 2017-07-31 | Disposition: A | Discharge status: Home / Self Care | Payer: BLUE CROSS/BLUE SHIELD | Attending: Emergency Medicine | Admitting: Emergency Medicine

## 2017-07-31 DIAGNOSIS — R109 Unspecified abdominal pain: Secondary | ICD-10-CM | POA: Diagnosis not present

## 2017-07-31 DIAGNOSIS — R1013 Epigastric pain: Secondary | ICD-10-CM

## 2017-07-31 DIAGNOSIS — K5903 Drug induced constipation: Secondary | ICD-10-CM

## 2017-07-31 MED ORDER — GI COCKTAIL ~~LOC~~
30.0000 mL | Freq: Once | ORAL | Status: AC
  Administered 2017-07-31: 30 mL via ORAL

## 2017-07-31 MED ORDER — GI COCKTAIL ~~LOC~~
ORAL | Status: AC
  Filled 2017-07-31: qty 30

## 2017-07-31 NOTE — Discharge Instructions (Addendum)
If you are constipated. The likely reason is taking the pain medication. He needs to be taking some sort of laxative while taking pain medicine. Suggest Dulcolax. Take 1 of these daily while taking pain medicine. Also obtaining MiraLAX and drink 3 glasses a MiraLAX today and if by 10:00 tonight he do not have good results take another 2 or 3 glasses. For worsening, new symptoms or problems may need to go to emergency department. Take Zantac 150 mg twice a day for the next 2 or 3 days.

## 2017-07-31 NOTE — ED Triage Notes (Signed)
PT C/O: constant pain w/intermittent sharp pain.... sts pain occurred after LLE surgery on 12/20... LBM = 07/29/17 .... Pt was taking Oxycodone while in the hospital.... Taking tramadol for pain   ONSET: 07/29/17  DENIES: fevers  TAKING MEDS: Tramadol   A&O x4... NAD... Ambulatory
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
## 2017-09-02 ENCOUNTER — Emergency Department (HOSPITAL_COMMUNITY): Payer: BLUE CROSS/BLUE SHIELD

## 2017-09-02 ENCOUNTER — Encounter (HOSPITAL_COMMUNITY): Payer: Self-pay | Admitting: Emergency Medicine

## 2017-09-02 ENCOUNTER — Emergency Department (HOSPITAL_COMMUNITY)
Admission: EM | Admit: 2017-09-02 | Discharge: 2017-09-02 | Disposition: A | Discharge status: Home / Self Care | Payer: BLUE CROSS/BLUE SHIELD | Attending: Emergency Medicine | Admitting: Emergency Medicine

## 2017-09-02 DIAGNOSIS — Z79899 Other long term (current) drug therapy: Secondary | ICD-10-CM | POA: Insufficient documentation

## 2017-09-02 DIAGNOSIS — M79672 Pain in left foot: Secondary | ICD-10-CM | POA: Insufficient documentation

## 2017-09-02 DIAGNOSIS — M79671 Pain in right foot: Secondary | ICD-10-CM | POA: Insufficient documentation

## 2017-09-02 DIAGNOSIS — M7989 Other specified soft tissue disorders: Secondary | ICD-10-CM | POA: Insufficient documentation

## 2017-09-02 DIAGNOSIS — R2243 Localized swelling, mass and lump, lower limb, bilateral: Secondary | ICD-10-CM | POA: Diagnosis present

## 2017-09-02 DIAGNOSIS — Z87891 Personal history of nicotine dependence: Secondary | ICD-10-CM | POA: Diagnosis not present

## 2017-09-02 LAB — BASIC METABOLIC PANEL
ANION GAP: 11 (ref 5–15)
BUN: 9 mg/dL (ref 6–20)
CHLORIDE: 102 mmol/L (ref 101–111)
CO2: 24 mmol/L (ref 22–32)
Calcium: 8.8 mg/dL — ABNORMAL LOW (ref 8.9–10.3)
Creatinine, Ser: 0.8 mg/dL (ref 0.61–1.24)
GFR calc Af Amer: >60 mL/min (ref >60)
GFR calc non Af Amer: >60 mL/min (ref >60)
GLUCOSE: 133 mg/dL — AB (ref 65–99)
POTASSIUM: 3.6 mmol/L (ref 3.5–5.1)
SODIUM: 137 mmol/L (ref 135–145)

## 2017-09-02 LAB — CBC
HCT: 39.9 % (ref 39.0–52.0)
HEMOGLOBIN: 13.2 g/dL (ref 13.0–17.0)
MCH: 31 pg (ref 26.0–34.0)
MCHC: 33.1 g/dL (ref 30.0–36.0)
MCV: 93.7 fL (ref 78.0–100.0)
Platelets: 135 10*3/uL — ABNORMAL LOW (ref 150–400)
RBC: 4.26 MIL/uL (ref 4.22–5.81)
RDW: 13.2 % (ref 11.5–15.5)
WBC: 7.1 10*3/uL (ref 4.0–10.5)

## 2017-09-02 LAB — BRAIN NATRIURETIC PEPTIDE: B NATRIURETIC PEPTIDE 5: 26.4 pg/mL (ref 0.0–100.0)

## 2017-09-02 MED ORDER — OXYCODONE-ACETAMINOPHEN 5-325 MG PO TABS
1.0000 | ORAL_TABLET | Freq: Once | ORAL | Status: AC
  Administered 2017-09-02: 1 via ORAL
  Filled 2017-09-02: qty 1

## 2017-09-02 MED ORDER — FUROSEMIDE 20 MG PO TABS: 20.0000 mg | ORAL_TABLET | Freq: Every day | ORAL | 0 refills | Status: DC
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
## 2017-09-02 NOTE — Discharge Instructions (Signed)
Return to the ED with any concerns including difficulty breathing, chest pain, fever, increased redness streaking up your leg, or any other alarming symptoms  You should be sure to keep you feet elevated above the level of your heart when not walking

## 2017-09-02 NOTE — ED Triage Notes (Signed)
Pt presents to ED for assessment of bilateral leg swelling and pain.  Hx of broken ankle with hardware and removal, continued pain and swelling and some redness starting 2-3 days ago.  Pt also c/o plantar pain with ambulation to the right leg.

## 2017-09-06 ENCOUNTER — Other Ambulatory Visit: Payer: Self-pay

## 2017-09-06 ENCOUNTER — Encounter: Payer: Self-pay | Admitting: Student in an Organized Health Care Education/Training Program

## 2017-09-06 ENCOUNTER — Ambulatory Visit: Payer: BLUE CROSS/BLUE SHIELD | Admitting: Student in an Organized Health Care Education/Training Program

## 2017-09-06 VITALS — BP 108/78 | HR 69 | Temp 98.6°F | Ht 66.0 in | Wt 157.2 lb

## 2017-09-06 DIAGNOSIS — Z7689 Persons encountering health services in other specified circumstances: Secondary | ICD-10-CM | POA: Diagnosis not present

## 2017-09-06 DIAGNOSIS — M722 Plantar fascial fibromatosis: Secondary | ICD-10-CM | POA: Insufficient documentation

## 2017-09-06 NOTE — Patient Instructions (Signed)
It was a pleasure seeing you today in our clinic. Today we discussed foot pain. Here is the treatment plan we have discussed and agreed upon together:  Please use ibuprofen for your foot pain, which is likely caused by something called plantar fasciitis.  Please stretch your feet using a tennis ball and use ice to help with your symptoms  Please schedule another appointment so we may address your foot pain further.  Our clinic's number is [phone]. Please call with questions or concerns about what we discussed today.  Be well, Dr. Mosetta Putt   Plantar Fasciitis Plantar fasciitis is a painful foot condition that affects the heel. It occurs when the band of tissue that connects the toes to the heel bone (plantar fascia) becomes irritated. This can happen after exercising too much or doing other repetitive activities (overuse injury). The pain from plantar fasciitis can range from mild irritation to severe pain that makes it difficult for you to walk or move. The pain is usually worse in the morning or after you have been sitting or lying down for a while. What are the causes? This condition may be caused by:  Standing for long periods of time.  Wearing shoes that do not fit.  Doing high-impact activities, including running, aerobics, and ballet.  Being overweight.  Having an abnormal way of walking (gait).  Having tight calf muscles.  Having high arches in your feet.  Starting a new athletic activity.  What are the signs or symptoms? The main symptom of this condition is heel pain. Other symptoms include:  Pain that gets worse after activity or exercise.  Pain that is worse in the morning or after resting.  Pain that goes away after you walk for a few minutes.  How is this diagnosed? This condition may be diagnosed based on your signs and symptoms. Your health care provider will also do a physical exam to check for:  A tender area on the bottom of your foot.  A high arch in  your foot.  Pain when you move your foot.  Difficulty moving your foot.  You may also need to have imaging studies to confirm the diagnosis. These can include:  X-rays.  Ultrasound.  MRI.  How is this treated? Treatment for plantar fasciitis depends on the severity of the condition. Your treatment may include:  Rest, ice, and over-the-counter pain medicines to manage your pain.  Exercises to stretch your calves and your plantar fascia.  A splint that holds your foot in a stretched, upward position while you sleep (night splint).  Physical therapy to relieve symptoms and prevent problems in the future.  Cortisone injections to relieve severe pain.  Extracorporeal shock wave therapy (ESWT) to stimulate damaged plantar fascia with electrical impulses. It is often used as a last resort before surgery.  Surgery, if other treatments have not worked after 12 months.  Follow these instructions at home:  Take medicines only as directed by your health care provider.  Avoid activities that cause pain.  Roll the bottom of your foot over a bag of ice or a bottle of cold water. Do this for 20 minutes, 3-4 times a day.  Perform simple stretches as directed by your health care provider.  Try wearing athletic shoes with air-sole or gel-sole cushions or soft shoe inserts.  Wear a night splint while sleeping, if directed by your health care provider.  Keep all follow-up appointments with your health care provider. How is this prevented?  Do not perform  exercises or activities that cause heel pain.  Consider finding low-impact activities if you continue to have problems.  Lose weight if you need to. The best way to prevent plantar fasciitis is to avoid the activities that aggravate your plantar fascia. Contact a health care provider if:  Your symptoms do not go away after treatment with home care measures.  Your pain gets worse.  Your pain affects your ability to move or do  your daily activities. This information is not intended to replace advice given to you by your health care provider. Make sure you discuss any questions you have with your health care provider. Document Released: 04/19/2001 Document Revised: 12/28/2015 Document Reviewed: 06/04/2014 Elsevier Interactive Patient Education  [employer]2018 [employer] Inc[employer]

## 2017-09-06 NOTE — Assessment & Plan Note (Signed)
Bilateral. Likely 2/2 long periods of standing at work. - continue tylenol, can switch off with ibuprofen - ice, stretching advised - consider steroid injection at next visit, asked him to schedule follow up - work note provided

## 2017-09-06 NOTE — Assessment & Plan Note (Signed)
Medical history reviewed and documented above.
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
## 2017-09-18 DIAGNOSIS — Z978 Presence of other specified devices: Secondary | ICD-10-CM | POA: Insufficient documentation

## 2017-09-28 ENCOUNTER — Encounter: Payer: Self-pay | Admitting: Student in an Organized Health Care Education/Training Program

## 2017-09-28 ENCOUNTER — Ambulatory Visit (INDEPENDENT_AMBULATORY_CARE_PROVIDER_SITE_OTHER): Payer: BLUE CROSS/BLUE SHIELD | Admitting: Student in an Organized Health Care Education/Training Program

## 2017-09-28 ENCOUNTER — Other Ambulatory Visit: Payer: Self-pay

## 2017-09-28 VITALS — BP 120/98 | HR 69 | Temp 98.4°F | Ht 66.0 in | Wt 160.8 lb

## 2017-09-28 DIAGNOSIS — M7740 Metatarsalgia, unspecified foot: Secondary | ICD-10-CM | POA: Diagnosis not present

## 2017-09-28 NOTE — Progress Notes (Signed)
   CC: Bilateral foot pain  HPI: Troy Bishop is a 27 y.o. male presenting with bilateral foot pain.  Patient endorses bilateral dorsal foot pain.  Pain was initially heel pain, however he has been wearing compressing sleeves over his feet, and now feels the pain is worst on the bottom of his foot at the toe pads.  He reports the pain is 7 out of 10 in severity and has persisted for 1 month.  Pain is worst in the morning, and additionally is worse at work after he has been on his feet for many hours.  He describes the pain as sharp.  He reports taking meloxicam which has not improved his pain.  He additionally endorses doing stretching exercises and using heat at home which is not helped.  He does endorse wearing supportive shoes at work.  Review of Symptoms:  See HPI for ROS.   CC, SH/smoking status, and VS noted.  Objective: BP (!) 120/98   Pulse 69   Temp 98.4 F (36.9 C) (Oral)   Ht 5\' 6"  (1.676 m)   Wt 160 lb 12.8 oz (72.9 kg)   SpO2 97%   BMI 25.95 kg/m  GEN: NAD, alert, cooperative, and pleasant. FOOT: Bilateral tenderness to palpation worst over the toe pad.  Mild tenderness to palpation of the heel.  No swelling, redness, evidence of injury.  No gross deformity.  Full range of motion of the ankle without tenderness to palpation over the ankle PSYCH: AAOx3, appropriate affect  Assessment and plan:  Metatarsalgia Uncertain etiology, however pain seems to be most consistent with metatarsalgia.  Previously considered plantar fasciitis, however pain is now worse under the toe pads, and does not have tenderness to palpation at the heel.  Additionally considered Morton's neuroma, however no pain with compression of the foot, no pain between the toes. -Ambulatory referral placed to sports medicine at today's visit. -Encourage use of supportive shoes -Previously wrote a letter so patient may take breaks to sit at work -Continue OTC pain management -Advised patient to take breaks from  using compressive sleeves over his feet, as these may exacerbate symptoms if used for prolonged periods of time.   Orders Placed This Encounter  Procedures  . Ambulatory referral to Sports Medicine    Referral Priority:   Routine    Referral Type:   Consultation    Number of Visits Requested:   1    Howard PouchLauren Rowen Hur, MD,MS,  PGY2 10/04/2017 10:54 AM

## 2017-09-28 NOTE — Patient Instructions (Signed)
It was a pleasure seeing you today in our clinic. Today we discussed your foot pain. Here is the treatment plan we have discussed and agreed upon together:  A consult was placed to  Sport Medicine at today's visit.  You will receive a call to schedule an appointment. If you do not receive a call within two weeks please call our office so we can place the consult again.  Our clinic's number is [phone]. Please call with questions or concerns about what we discussed today.  Be well, Dr. Mosetta PuttFeng

## 2017-10-04 ENCOUNTER — Encounter: Payer: Self-pay | Admitting: Student in an Organized Health Care Education/Training Program

## 2017-10-04 ENCOUNTER — Ambulatory Visit: Payer: BLUE CROSS/BLUE SHIELD | Admitting: Family Medicine

## 2017-10-04 DIAGNOSIS — M7740 Metatarsalgia, unspecified foot: Secondary | ICD-10-CM | POA: Insufficient documentation

## 2017-10-04 NOTE — Assessment & Plan Note (Addendum)
Uncertain etiology, however pain seems to be most consistent with metatarsalgia.  Previously considered plantar fasciitis, however pain is now worse under the toe pads, and does not have tenderness to palpation at the heel.  Additionally considered Morton's neuroma, however no pain with compression of the foot, no pain between the toes. -Ambulatory referral placed to sports medicine at today's visit. -Encourage use of supportive shoes -Previously wrote a letter so patient may take breaks to sit at work -Continue OTC pain management -Advised patient to take breaks from using compressive sleeves over his feet, as these may exacerbate symptoms if used for prolonged periods of time.

## 2017-10-11 ENCOUNTER — Ambulatory Visit: Payer: BLUE CROSS/BLUE SHIELD | Admitting: Family Medicine

## 2017-10-18 ENCOUNTER — Other Ambulatory Visit: Payer: Self-pay | Admitting: Internal Medicine

## 2017-10-25 ENCOUNTER — Encounter: Payer: Self-pay | Admitting: Family Medicine

## 2017-10-25 ENCOUNTER — Ambulatory Visit: Payer: BLUE CROSS/BLUE SHIELD | Admitting: Family Medicine

## 2017-10-25 DIAGNOSIS — M7742 Metatarsalgia, left foot: Secondary | ICD-10-CM

## 2017-10-25 DIAGNOSIS — M7741 Metatarsalgia, right foot: Secondary | ICD-10-CM | POA: Diagnosis not present
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
## 2017-10-25 NOTE — Assessment & Plan Note (Signed)
Patient is here with complaints consistent with bilateral metatarsalgia.  Significant bilateral skin thickening and friability noted during exam makes the possibility of a moisture or fungal related etiology also a possibility (this would typically be easier to distinguish between, unfortunately patient refused an interpreter and does not always understand questions completely).  -OTC shoe inserts purchased by patient seemed appropriate but poorly fitting >> these were trimmed down today -Bilateral metatarsal cookies added to help with metatarsal pain. -Encouraged to change socks every day and allow feet to breathe when at relaxing. -Patient informed to follow-up in 2-4 weeks if pain persists.  Next: If pain persists then would consider trying our own [employer] Shoe Inserts with a metatarsal pad and scaphoid pad.

## 2017-11-29 ENCOUNTER — Ambulatory Visit: Payer: BLUE CROSS/BLUE SHIELD | Admitting: Family Medicine

## 2017-11-30 IMAGING — CR DG CHEST 2V
2 series · 2 of 2 positions shown · non-contrast
Comparison: 10/21/2012

CLINICAL DATA: Pt from home for eval of ongoing chest pain x2
months, pt states today he ate spicy food and pain got worse.

EXAM:
CHEST  2 VIEW

[chest pa]
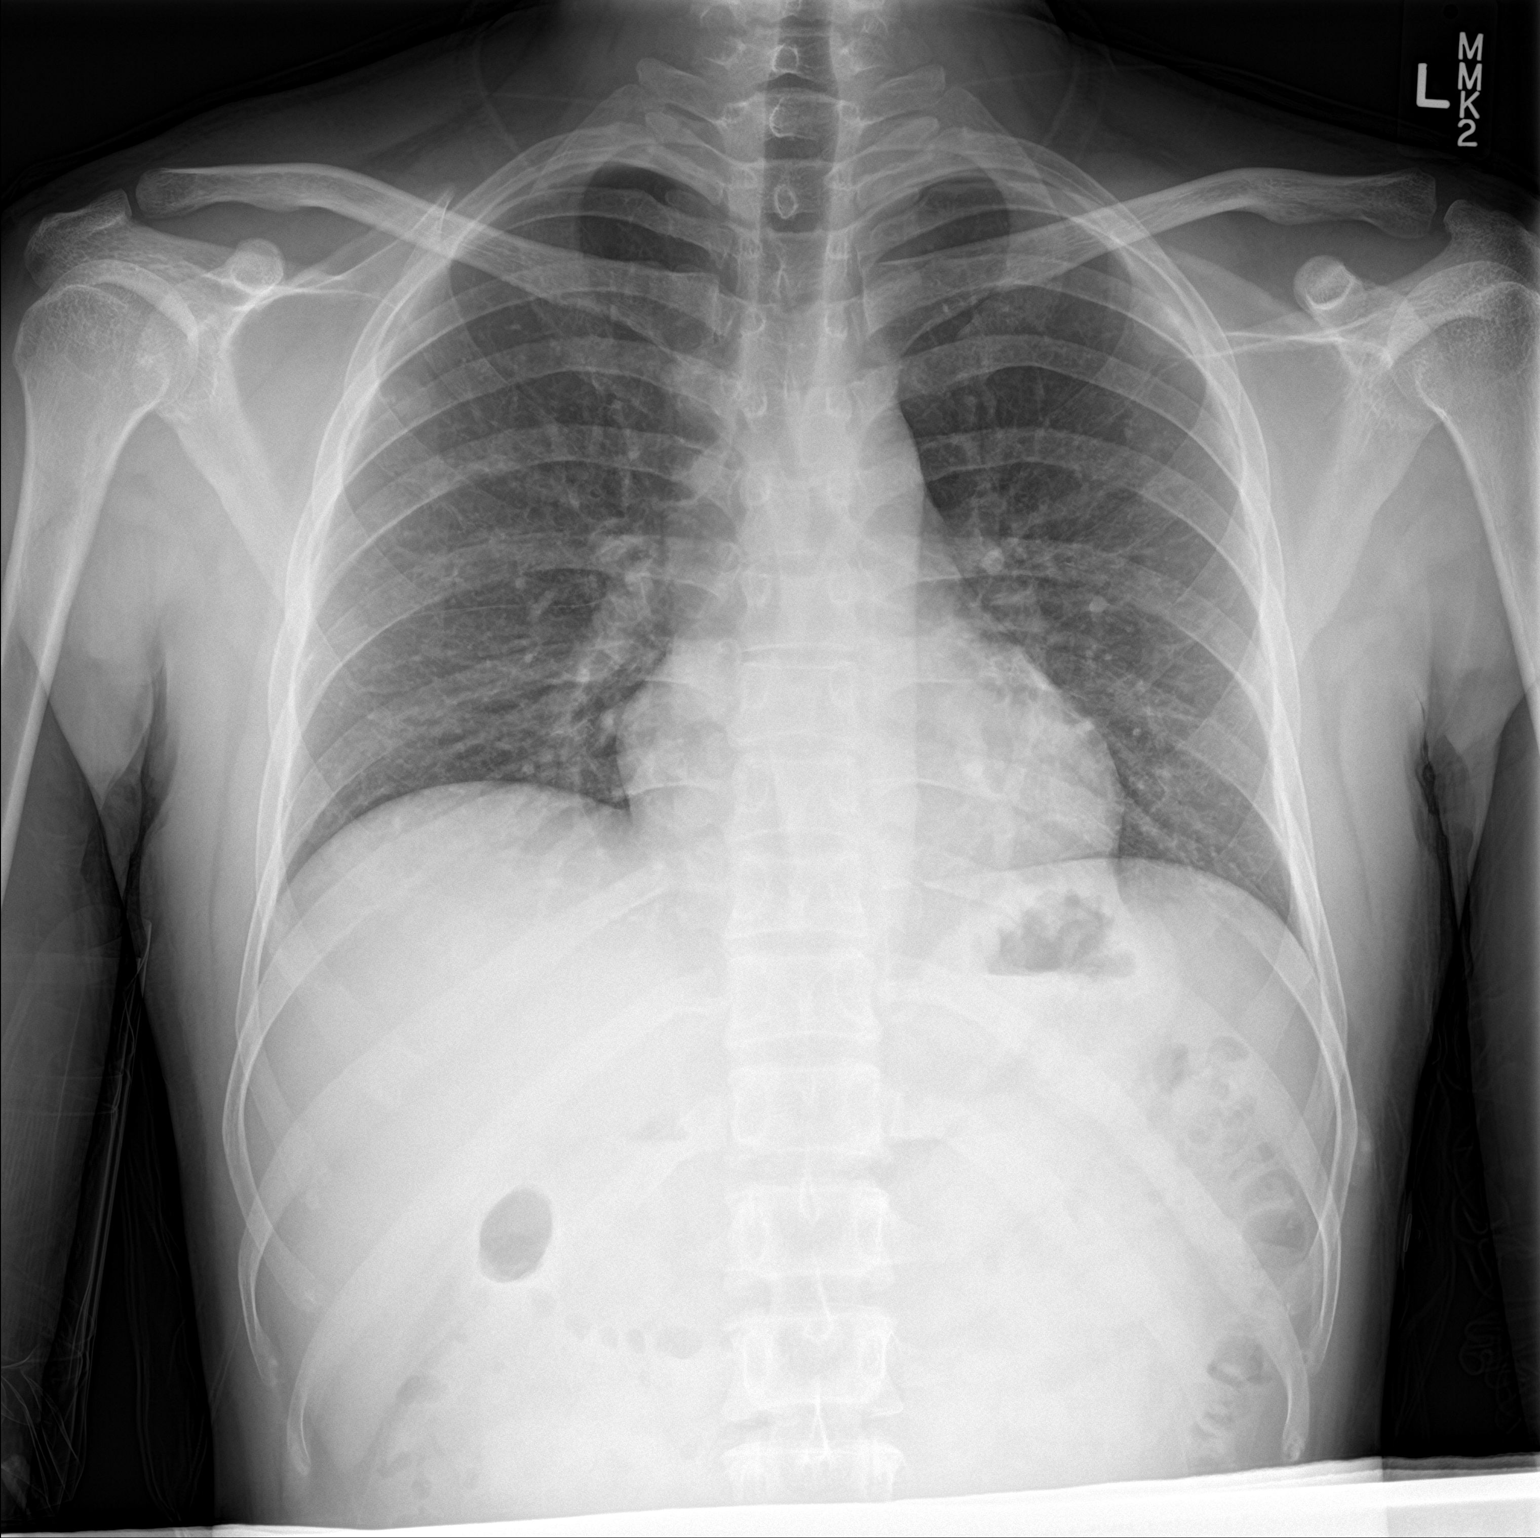

[chest lat]
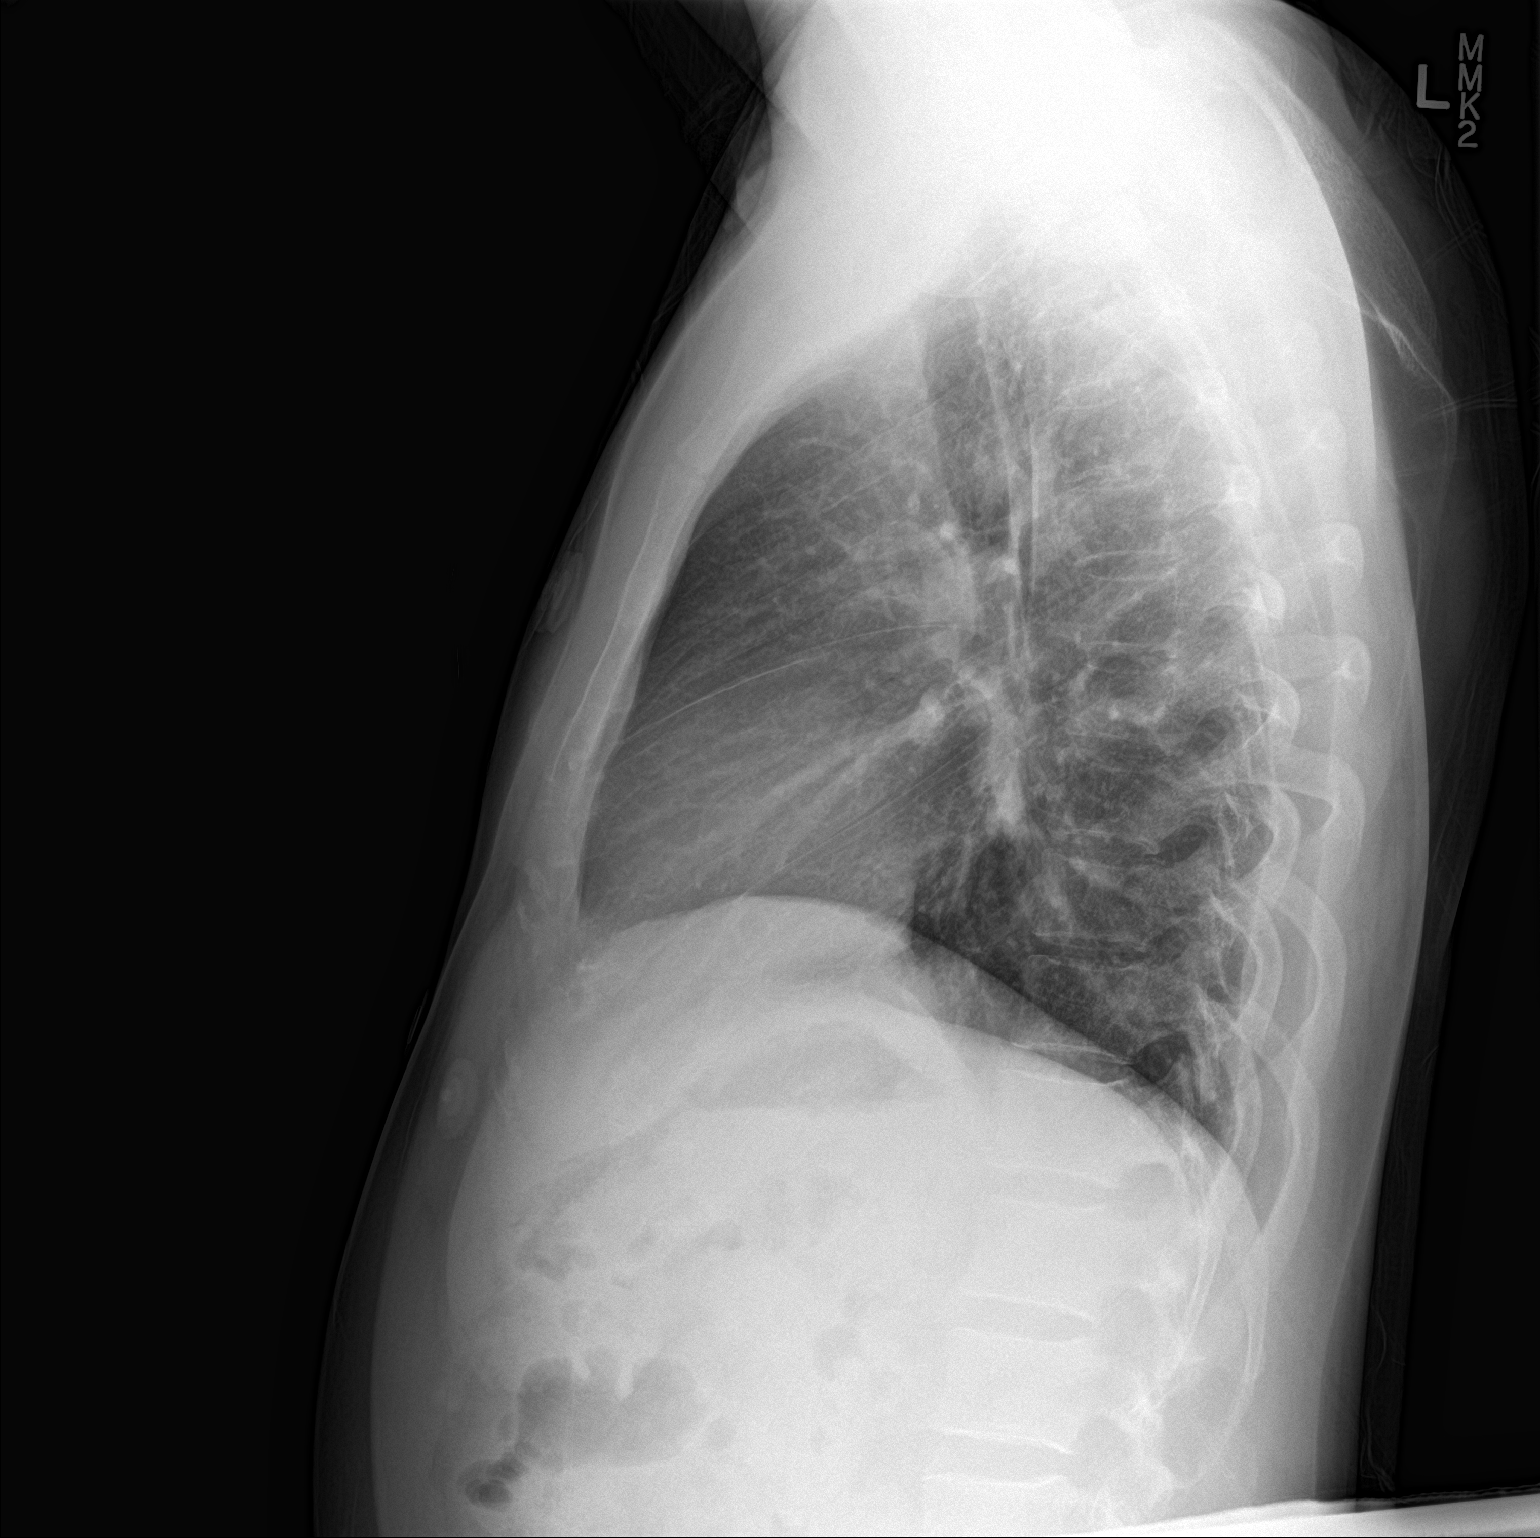

[2 of 2 positions shown; findings below may reference images not displayed]

FINDINGS: The heart size and mediastinal contours are within normal limits.
Both lungs are clear. No pleural effusion or pneumothorax. The
visualized skeletal structures are unremarkable.
IMPRESSION: No active cardiopulmonary disease.

## 2019-01-10 ENCOUNTER — Other Ambulatory Visit: Payer: BLUE CROSS/BLUE SHIELD

## 2019-01-10 ENCOUNTER — Telehealth: Payer: Self-pay | Admitting: General Practice

## 2019-01-10 ENCOUNTER — Ambulatory Visit (HOSPITAL_COMMUNITY)
Admission: EM | Admit: 2019-01-10 | Discharge: 2019-01-10 | Disposition: A | Discharge status: Home / Self Care | Payer: BLUE CROSS/BLUE SHIELD | Attending: Family Medicine | Admitting: Family Medicine

## 2019-01-10 ENCOUNTER — Telehealth: Payer: Self-pay | Admitting: Student in an Organized Health Care Education/Training Program

## 2019-01-10 ENCOUNTER — Encounter (HOSPITAL_COMMUNITY): Payer: Self-pay

## 2019-01-10 DIAGNOSIS — B349 Viral infection, unspecified: Secondary | ICD-10-CM

## 2019-01-10 MED ORDER — BENZONATATE 100 MG PO CAPS: 100.0000 mg | ORAL_CAPSULE | Freq: Three times a day (TID) | ORAL | 0 refills | Status: DC

## 2019-01-10 NOTE — ED Triage Notes (Signed)
Pt c/o headache, fever, cough and runny nose since yesterday. States took 2 tylenol last night, temp 100.2

## 2019-01-10 NOTE — Discharge Instructions (Addendum)
This is some sort of viral illness.  Based on symptoms and previous exposure we will send you for COVID testing.  They will be calling you with an appointment in the next hour or so Troy Bishop will be sent to the pharmacy for cough and you can take Tylenol as needed for pain and fever Hopefully we will have your results by Sunday and I will call you Do not return to work until you have your results.  The address for the testing site is [address] [place]

## 2019-01-10 NOTE — Telephone Encounter (Signed)
Pt has been scheduled Covid-19 testing. ° °Scheduled using an interpreter, reached pt at [facility].  ° °Pt was referred by: Bast, Traci A, NP ° ° °
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
## 2019-01-10 NOTE — Telephone Encounter (Signed)
-----   Message from Janace Aris, NP sent at 01/10/2019  9:34 AM EDT ----- Pt having dry cough, rhinorrhea, fever since yesterday. Father had COVID and is now in nursing home.

## 2019-01-10 NOTE — Telephone Encounter (Signed)
Pt has been scheduled Covid-19 testing.  Scheduled using an interpreter, reached pt at [facility].   Pt was referred by: Janace Aris, NP

## 2019-01-12 LAB — NOVEL CORONAVIRUS, NAA: SARS-CoV-2, NAA: DETECTED — AB

## 2019-01-13 ENCOUNTER — Encounter (HOSPITAL_COMMUNITY): Payer: Self-pay | Admitting: Family Medicine

## 2019-01-13 ENCOUNTER — Telehealth (HOSPITAL_COMMUNITY): Payer: Self-pay | Admitting: Family Medicine

## 2019-01-13 NOTE — Telephone Encounter (Signed)
Spoke with patient over the phone and told of positive COVID results. All of patient's questions answered and he was understanding. Patient will come up here to receive an extension of work note to return on Thursday.

## 2019-11-16 IMAGING — DX DG ABDOMEN 2V
2 series · 2 of 2 positions shown · non-contrast
Comparison: 04/22/2016

CLINICAL DATA: Abdominal pain for 4 days.

EXAM:
ABDOMEN - 2 VIEW

[abdomen erect]
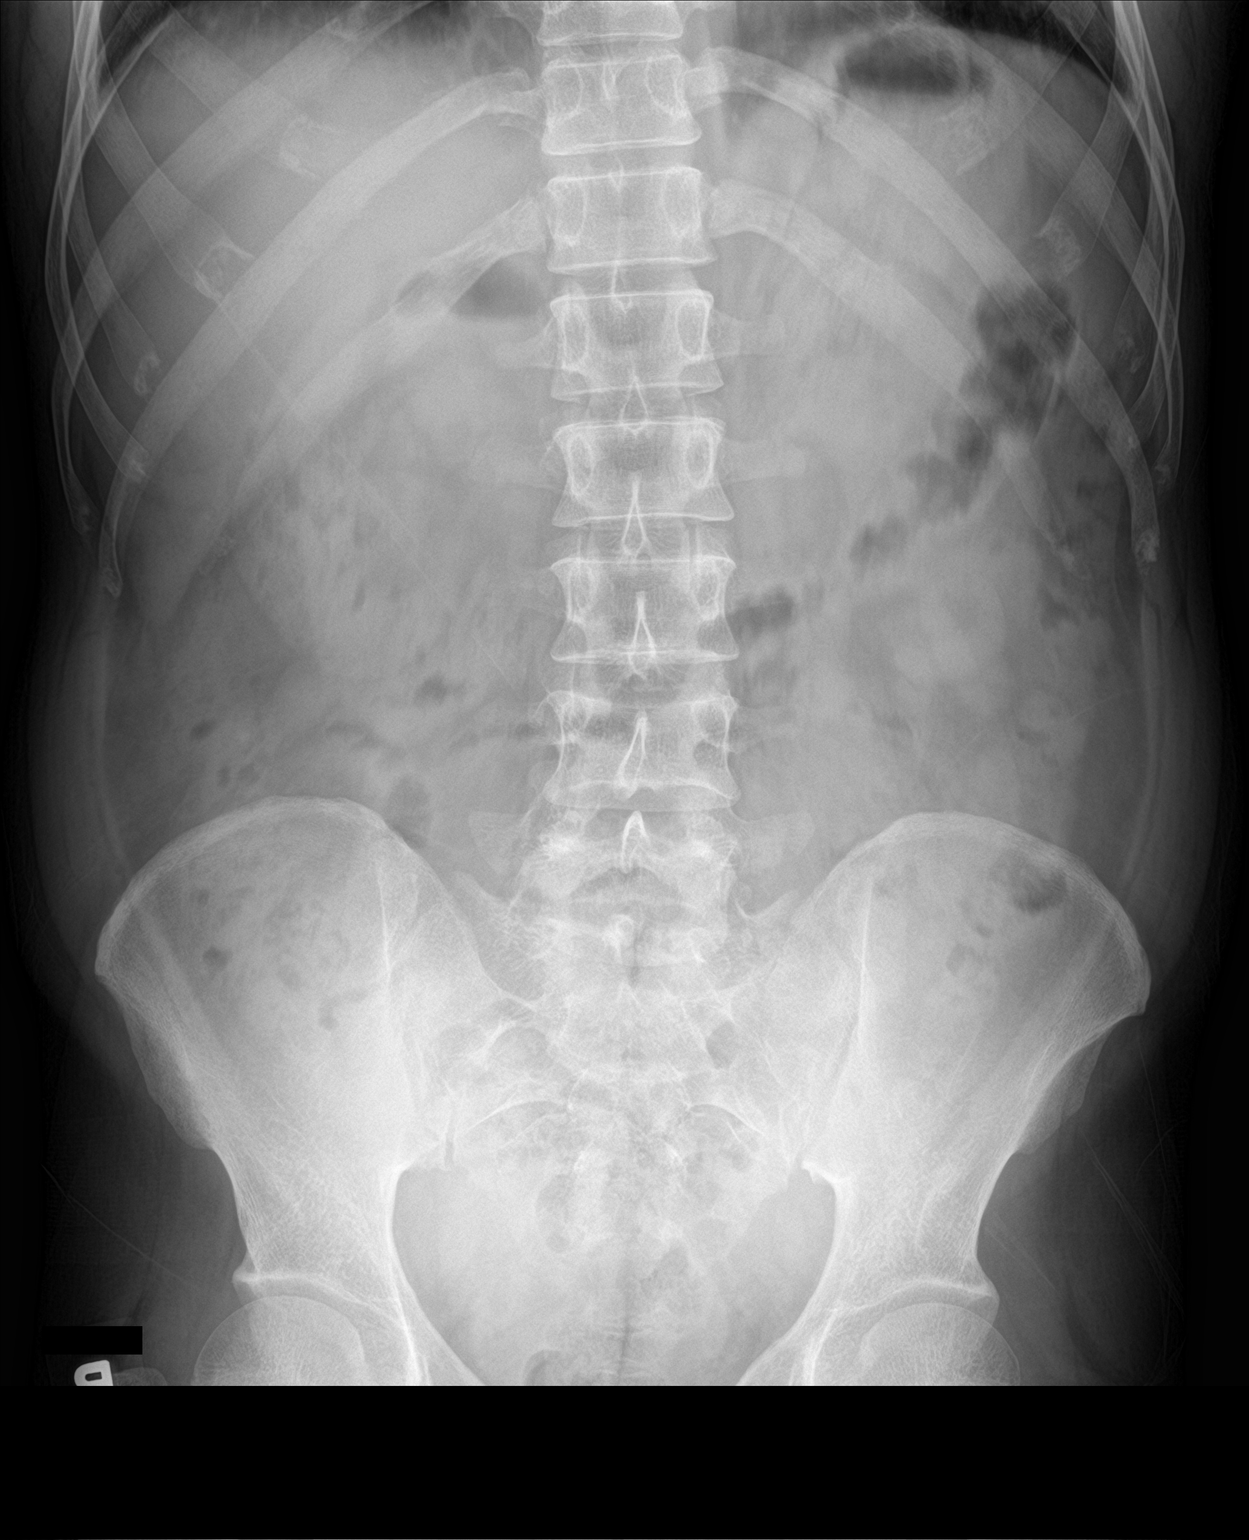

[abdomen supine]
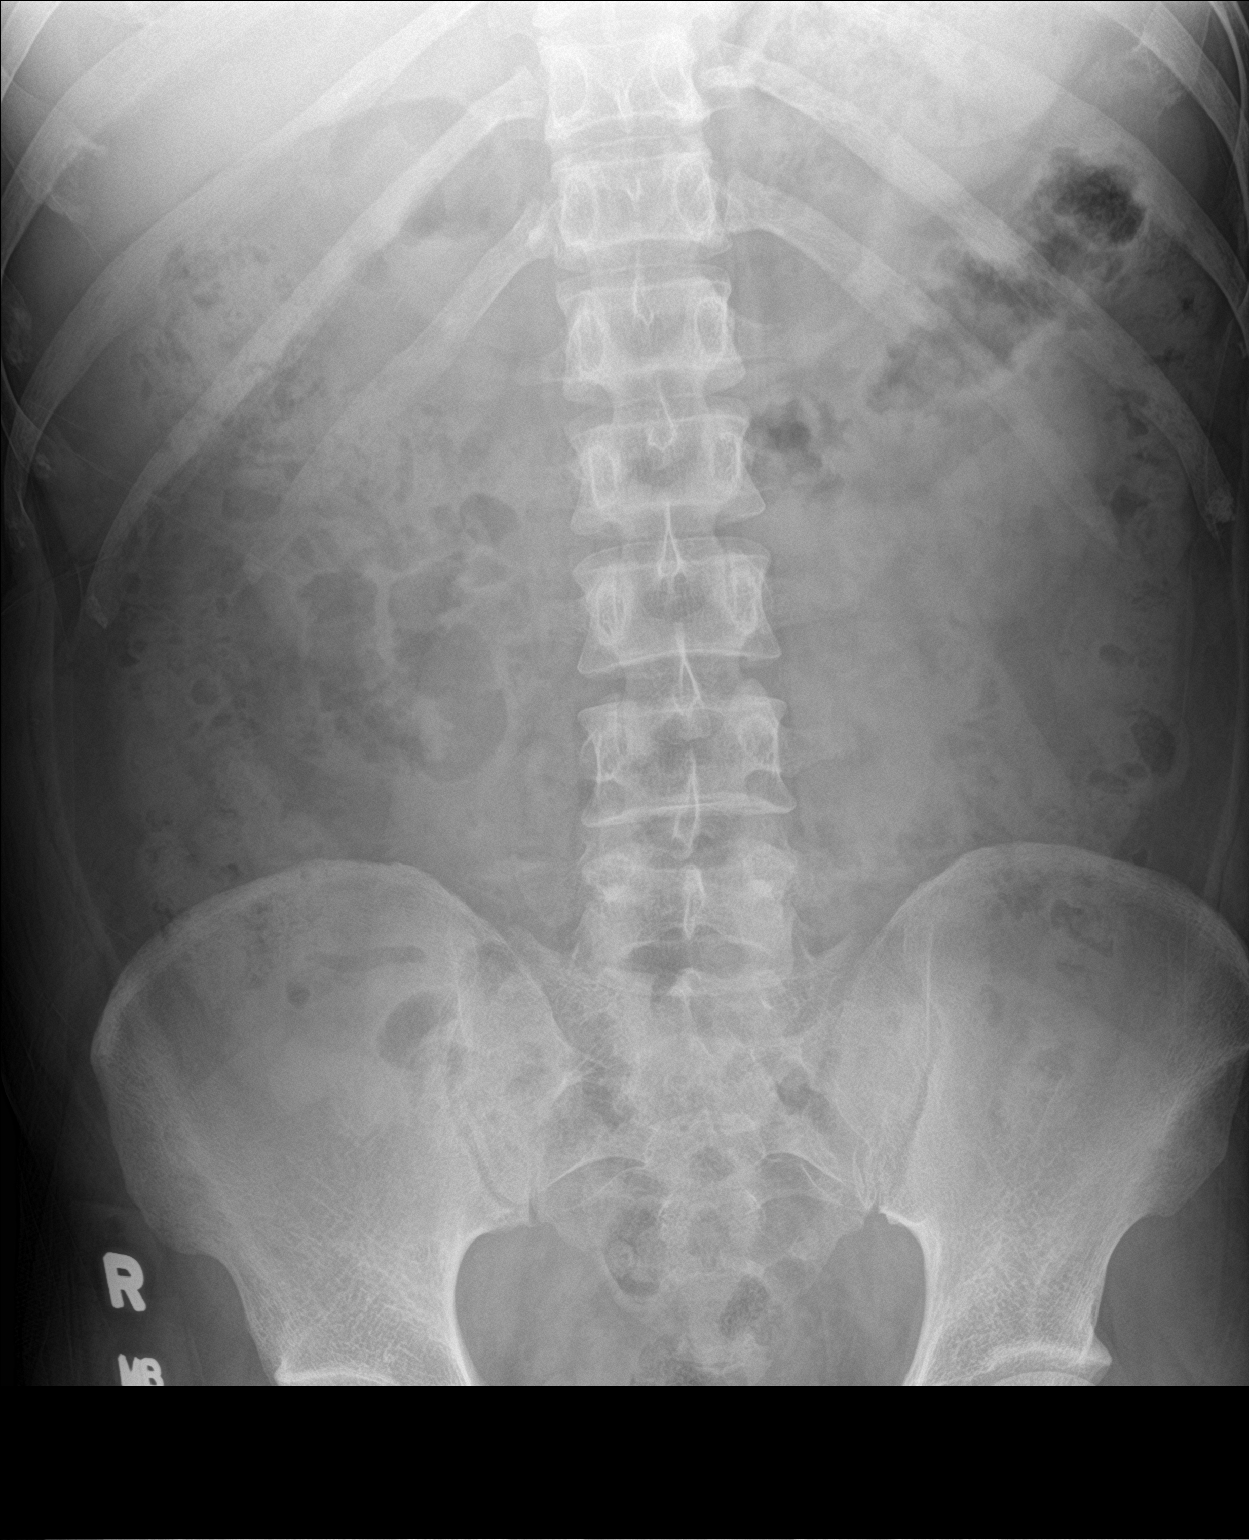

[2 of 2 positions shown; findings below may reference images not displayed]

FINDINGS: Normal bowel gas pattern.  Mild increased stool burden in the colon.

No renal or ureteral stones.  Soft tissues are unremarkable.

Skeletal structures are within normal limits.
IMPRESSION: 1. No acute findings.  No evidence of bowel obstruction.
2. Mild increased stool in the colon.

## 2020-05-27 ENCOUNTER — Encounter (HOSPITAL_COMMUNITY): Payer: Self-pay | Admitting: Emergency Medicine

## 2020-05-27 ENCOUNTER — Other Ambulatory Visit: Payer: Self-pay

## 2020-05-27 ENCOUNTER — Ambulatory Visit (HOSPITAL_COMMUNITY)
Admission: EM | Admit: 2020-05-27 | Discharge: 2020-05-27 | Disposition: A | Discharge status: Home / Self Care | Payer: BLUE CROSS/BLUE SHIELD | Attending: Family Medicine | Admitting: Family Medicine

## 2020-05-27 DIAGNOSIS — Q541 Hypospadias, penile: Secondary | ICD-10-CM

## 2020-05-27 DIAGNOSIS — R030 Elevated blood-pressure reading, without diagnosis of hypertension: Secondary | ICD-10-CM

## 2020-05-27 MED ORDER — MUPIROCIN 2 % EX OINT: 1.0000 "application " | TOPICAL_OINTMENT | Freq: Two times a day (BID) | CUTANEOUS | 0 refills | Status: DC

## 2020-05-27 NOTE — ED Triage Notes (Signed)
Patient c/o of having pain in the tip or the penis with an ulcer and a "hole in the skin also" 6-7 months. "Tear and lesion in the skin".   Pain  And burning sensation when washing that goes to a 10/10.   Pain with intercourse.  Patient denies any discharge or blood in penis.

## 2020-05-27 NOTE — Discharge Instructions (Signed)
Your blood pressure was noted to be elevated during your visit today. If you are currently taking medication for high blood pressure, please ensure you are taking this as directed. If you do not have a history of high blood pressure and your blood pressure remains persistently elevated, you may need to begin taking a medication at some point. You may return here within the next few days to recheck if unable to see your primary care provider or if do not have a one.  BP (!) 150/101 (BP Location: Left Arm)    Pulse 85    Temp 99 F (37.2 C) (Oral)    Resp 18    Ht 5\' 6"  (1.676 m)    SpO2 97%    BMI 26.63 kg/m

## 2020-05-27 NOTE — ED Provider Notes (Signed)
[facility]    ASSESSMENT & PLAN:  1. Penile hypospadias   2. Elevated blood pressure reading without diagnosis of hypertension     Question of hypospadias. He is unsure of exactly how long this has been present.  Recommend:  Follow-up Information    Schedule an appointment as soon as possible for a visit  with [facility].   Contact information: [address] [place] [address] [phone]              In case of superficial bacterial infection given slight irritation around glans of penis: Meds ordered this encounter  Medications  . mupirocin ointment (BACTROBAN) 2 %    Sig: Apply 1 application topically 2 (two) times daily.    Dispense:  22 g    Refill:  0      Discharge Instructions     Your blood pressure was noted to be elevated during your visit today. If you are currently taking medication for high blood pressure, please ensure you are taking this as directed. If you do not have a history of high blood pressure and your blood pressure remains persistently elevated, you may need to begin taking a medication at some point. You may return here within the next few days to recheck if unable to see your primary care provider or if do not have a one.  BP (!) 150/101 (BP Location: Left Arm)   Pulse 85   Temp 99 F (37.2 C) (Oral)   Resp 18   Ht 5\' 6"  (1.676 m)   SpO2 97%   BMI 26.63 kg/m        Outlined signs and symptoms indicating need for more acute intervention. Patient verbalized understanding. After Visit Summary given.  SUBJECTIVE: No interpreter used. Troy Bishop is a 29 y.o. male who reports irritation around head of penis. Also reports "a hole" under head of penis where urine leaks; unsure of duration of symptoms; worse past six months. No penile discharge. One male sex partner. No concern over STI.  OBJECTIVE:  Vitals:   05/27/20 1013 05/27/20 1014  BP:  (!) 150/101  Pulse:  85  Resp:   18  Temp:  99 F (37.2 C)  TempSrc:  Oral  SpO2:  97%  Height: 5\' 6"  (1.676 m)    General appearance: alert; no distress Abdomen: soft Back: no CVA tenderness GU: slight irritation around glans of penis; no discharge; apparent hypospadias Extremities: no edema; symmetrical with no gross deformities Skin: warm and dry Neurologic: normal gait Psychological: alert and cooperative; normal mood and affect  Labs Reviewed - No data to display  No Known Allergies  Past Medical History:  Diagnosis Date  . Painful orthopaedic hardware [facility]) 07/2017   right tibia   Social History   Socioeconomic History  . Marital status: Married    Spouse name: Not on file  . Number of children: 3  . Years of education: Not on file  . Highest education level: Not on file  Occupational History  . Not on file  Tobacco Use  . Smoking status: Former Smoker    Quit date: 05/13/2014    Years since quitting: 6.0  . Smokeless tobacco: Never Used  Vaping Use  . Vaping Use: Never used  Substance and Sexual Activity  . Alcohol use: Yes    Comment: 4 times/week  . Drug use: No  . Sexual activity: Not on file  Other Topics Concern  . Not  on file  Social History Narrative   ** Merged History Encounter **       Social Determinants of Health   Financial Resource Strain:   . Difficulty of Paying Living Expenses: Not on file  Food Insecurity:   . Worried About Programme researcher, broadcasting/film/video in the Last Year: Not on file  . Ran Out of Food in the Last Year: Not on file  Transportation Needs:   . Lack of Transportation (Medical): Not on file  . Lack of Transportation (Non-Medical): Not on file  Physical Activity:   . Days of Exercise per Week: Not on file  . Minutes of Exercise per Session: Not on file  Stress:   . Feeling of Stress : Not on file  Social Connections:   . Frequency of Communication with Friends and Family: Not on file  . Frequency of Social Gatherings with Friends and Family: Not on file    . Attends Religious Services: Not on file  . Active Member of Clubs or Organizations: Not on file  . Attends Banker Meetings: Not on file  . Marital Status: Not on file  Intimate Partner Violence:   . Fear of Current or Ex-Partner: Not on file  . Emotionally Abused: Not on file  . Physically Abused: Not on file  . Sexually Abused: Not on file   History reviewed. No pertinent family history.     Mardella Layman, MD 05/27/20 1037

## 2020-07-03 ENCOUNTER — Other Ambulatory Visit: Payer: Self-pay

## 2020-07-03 ENCOUNTER — Emergency Department (HOSPITAL_COMMUNITY)
Admission: EM | Admit: 2020-07-03 | Discharge: 2020-07-04 | Disposition: A | Discharge status: Home / Self Care | Payer: BLUE CROSS/BLUE SHIELD | Attending: Emergency Medicine | Admitting: Emergency Medicine

## 2020-07-03 ENCOUNTER — Encounter (HOSPITAL_COMMUNITY): Payer: Self-pay | Admitting: Emergency Medicine

## 2020-07-03 DIAGNOSIS — R369 Urethral discharge, unspecified: Secondary | ICD-10-CM | POA: Diagnosis not present

## 2020-07-03 DIAGNOSIS — Z87891 Personal history of nicotine dependence: Secondary | ICD-10-CM | POA: Insufficient documentation

## 2020-07-03 DIAGNOSIS — R300 Dysuria: Secondary | ICD-10-CM

## 2020-07-03 DIAGNOSIS — N489 Disorder of penis, unspecified: Secondary | ICD-10-CM | POA: Insufficient documentation

## 2020-07-03 LAB — URINALYSIS, ROUTINE W REFLEX MICROSCOPIC
Bilirubin Urine: NEGATIVE
Glucose, UA: NEGATIVE mg/dL
Hgb urine dipstick: NEGATIVE
Ketones, ur: NEGATIVE mg/dL
Leukocytes,Ua: NEGATIVE
Nitrite: NEGATIVE
Protein, ur: NEGATIVE mg/dL
Specific Gravity, Urine: 1.004 — ABNORMAL LOW (ref 1.005–1.030)
pH: 6 (ref 5.0–8.0)

## 2020-07-03 MED ORDER — PHENAZOPYRIDINE HCL 200 MG PO TABS: 200.0000 mg | ORAL_TABLET | Freq: Three times a day (TID) | ORAL | 0 refills | Status: DC

## 2020-07-03 NOTE — ED Triage Notes (Signed)
Pt c/o penile burning x's 2 months.
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
## 2020-07-03 NOTE — Discharge Instructions (Signed)
Follow-up your urologist

## 2020-07-15 ENCOUNTER — Other Ambulatory Visit: Payer: Self-pay

## 2020-07-15 ENCOUNTER — Encounter (HOSPITAL_COMMUNITY): Payer: Self-pay | Admitting: *Deleted

## 2020-07-15 ENCOUNTER — Emergency Department (HOSPITAL_COMMUNITY)
Admission: EM | Admit: 2020-07-15 | Discharge: 2020-07-16 | Disposition: A | Discharge status: Home / Self Care | Payer: BLUE CROSS/BLUE SHIELD | Attending: Emergency Medicine | Admitting: Emergency Medicine

## 2020-07-15 DIAGNOSIS — R945 Abnormal results of liver function studies: Secondary | ICD-10-CM | POA: Insufficient documentation

## 2020-07-15 DIAGNOSIS — F1010 Alcohol abuse, uncomplicated: Secondary | ICD-10-CM | POA: Insufficient documentation

## 2020-07-15 DIAGNOSIS — R1010 Upper abdominal pain, unspecified: Secondary | ICD-10-CM | POA: Diagnosis present

## 2020-07-15 DIAGNOSIS — D696 Thrombocytopenia, unspecified: Secondary | ICD-10-CM | POA: Diagnosis not present

## 2020-07-15 DIAGNOSIS — R112 Nausea with vomiting, unspecified: Secondary | ICD-10-CM | POA: Diagnosis not present

## 2020-07-15 DIAGNOSIS — Z87891 Personal history of nicotine dependence: Secondary | ICD-10-CM | POA: Insufficient documentation

## 2020-07-15 DIAGNOSIS — R748 Abnormal levels of other serum enzymes: Secondary | ICD-10-CM

## 2020-07-15 LAB — URINALYSIS, ROUTINE W REFLEX MICROSCOPIC
Bilirubin Urine: NEGATIVE
Glucose, UA: NEGATIVE mg/dL
Hgb urine dipstick: NEGATIVE
Ketones, ur: NEGATIVE mg/dL
Leukocytes,Ua: NEGATIVE
Nitrite: NEGATIVE
Protein, ur: NEGATIVE mg/dL
Specific Gravity, Urine: 1.005 (ref 1.005–1.030)
pH: 7 (ref 5.0–8.0)

## 2020-07-15 LAB — CBC
HCT: 45.2 % (ref 39.0–52.0)
Hemoglobin: 15.1 g/dL (ref 13.0–17.0)
MCH: 31.4 pg (ref 26.0–34.0)
MCHC: 33.4 g/dL (ref 30.0–36.0)
MCV: 94 fL (ref 80.0–100.0)
Platelets: 97 10*3/uL — ABNORMAL LOW (ref 150–400)
RBC: 4.81 MIL/uL (ref 4.22–5.81)
RDW: 14.5 % (ref 11.5–15.5)
WBC: 5 10*3/uL (ref 4.0–10.5)
nRBC: 0 % (ref 0.0–0.2)

## 2020-07-15 NOTE — ED Triage Notes (Signed)
The pt is c/o abd and chest pain for 2 days and  According to the pts wife on the phone  Reports that he is a heavy drinker and that he has not had alcohol  For 2 days and she reports that he has been vomiting up blood

## 2020-07-16 LAB — COMPREHENSIVE METABOLIC PANEL
ALT: 188 U/L — ABNORMAL HIGH (ref 0–44)
AST: 520 U/L — ABNORMAL HIGH (ref 15–41)
Albumin: 4.2 g/dL (ref 3.5–5.0)
Alkaline Phosphatase: 95 U/L (ref 38–126)
Anion gap: 11 (ref 5–15)
BUN: <5 mg/dL — ABNORMAL LOW (ref 6–20)
CO2: 27 mmol/L (ref 22–32)
Calcium: 9.9 mg/dL (ref 8.9–10.3)
Chloride: 100 mmol/L (ref 98–111)
Creatinine, Ser: 0.74 mg/dL (ref 0.61–1.24)
GFR, Estimated: >60 mL/min (ref >60)
Glucose, Bld: 94 mg/dL (ref 70–99)
Potassium: 3.6 mmol/L (ref 3.5–5.1)
Sodium: 138 mmol/L (ref 135–145)
Total Bilirubin: 1.4 mg/dL — ABNORMAL HIGH (ref 0.3–1.2)
Total Protein: 8.8 g/dL — ABNORMAL HIGH (ref 6.5–8.1)

## 2020-07-16 LAB — LIPASE, BLOOD: Lipase: 29 U/L (ref 11–51)

## 2020-07-16 MED ORDER — OMEPRAZOLE 20 MG PO CPDR: 20.0000 mg | DELAYED_RELEASE_CAPSULE | Freq: Every day | ORAL | 0 refills | Status: DC

## 2020-07-16 MED ORDER — SUCRALFATE 1 G PO TABS: 1.0000 g | ORAL_TABLET | Freq: Three times a day (TID) | ORAL | 0 refills | Status: DC

## 2020-07-16 NOTE — Discharge Instructions (Signed)
Please follow-up with the clinic listed.  You should try to decrease your alcohol use.  If you run a fever, have worsening pain or symptoms, please return to the emergency department.
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
## 2020-07-23 ENCOUNTER — Telehealth: Payer: Self-pay

## 2020-07-23 NOTE — Telephone Encounter (Signed)
Copied from CRM [id number]. Topic: Appointment Scheduling - Scheduling Inquiry for Clinic >> Jul 23, 2020  3:14 PM Daphine Deutscher D wrote: Reason for CRM: Pt called sayig he ws in the ED this month and they told him to call [facility] for a new patient appt.  Needs a Nepali interpreter  CB#  [phone]   Called patient using interpreter services to schedule a HFU at [facility] with any available provider. After HFU patient can choose a PCP at [facility] to establish care with.

## 2020-10-14 ENCOUNTER — Ambulatory Visit: Payer: BLUE CROSS/BLUE SHIELD | Attending: Nurse Practitioner | Admitting: Nurse Practitioner

## 2020-10-14 ENCOUNTER — Other Ambulatory Visit: Payer: Self-pay

## 2021-03-05 ENCOUNTER — Encounter (HOSPITAL_COMMUNITY): Payer: Self-pay | Admitting: Emergency Medicine

## 2021-03-05 ENCOUNTER — Ambulatory Visit (HOSPITAL_COMMUNITY)
Admission: EM | Admit: 2021-03-05 | Discharge: 2021-03-05 | Disposition: A | Discharge status: Home / Self Care | Payer: BLUE CROSS/BLUE SHIELD | Attending: Family Medicine | Admitting: Family Medicine

## 2021-03-05 ENCOUNTER — Other Ambulatory Visit: Payer: Self-pay

## 2021-03-05 DIAGNOSIS — L409 Psoriasis, unspecified: Secondary | ICD-10-CM | POA: Diagnosis not present

## 2021-03-05 DIAGNOSIS — B356 Tinea cruris: Secondary | ICD-10-CM

## 2021-03-05 MED ORDER — KETOCONAZOLE 2 % EX CREA: 1.0000 "application " | TOPICAL_CREAM | Freq: Two times a day (BID) | CUTANEOUS | 0 refills | Status: DC | PRN

## 2021-03-05 MED ORDER — HYDROCORTISONE 1 % EX CREA: TOPICAL_CREAM | CUTANEOUS | 0 refills | Status: DC

## 2021-03-05 MED ORDER — TRIAMCINOLONE ACETONIDE 0.1 % EX CREA: 1.0000 "application " | TOPICAL_CREAM | Freq: Two times a day (BID) | CUTANEOUS | 0 refills | Status: DC
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
## 2021-03-05 NOTE — ED Triage Notes (Addendum)
Rash that has been on-going x 1 year, say's there have been getting worse over the last week. States the one in the groin have been itching and bothering him more recently. Says he's seen a dermatologist for this and the meds they prescribed him aren't working.

## 2021-03-05 NOTE — Discharge Instructions (Signed)
Follow Up as scheduled with your dermatologist next month

## 2021-03-15 ENCOUNTER — Encounter (HOSPITAL_COMMUNITY): Payer: Self-pay

## 2021-03-15 ENCOUNTER — Ambulatory Visit (HOSPITAL_COMMUNITY)
Admission: EM | Admit: 2021-03-15 | Discharge: 2021-03-15 | Disposition: A | Discharge status: Home / Self Care | Payer: BLUE CROSS/BLUE SHIELD | Attending: Family Medicine | Admitting: Family Medicine

## 2021-03-15 ENCOUNTER — Other Ambulatory Visit: Payer: Self-pay

## 2021-03-15 DIAGNOSIS — H60392 Other infective otitis externa, left ear: Secondary | ICD-10-CM | POA: Diagnosis not present

## 2021-03-15 MED ORDER — AMOXICILLIN-POT CLAVULANATE 875-125 MG PO TABS: 1.0000 | ORAL_TABLET | Freq: Two times a day (BID) | ORAL | 0 refills | Status: DC

## 2021-03-15 NOTE — ED Triage Notes (Signed)
Pt presents with c/o water coming out his ear x 3 days.  C/o of left side facial soreness.
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
## 2021-08-08 DIAGNOSIS — B079 Viral wart, unspecified: Secondary | ICD-10-CM

## 2021-08-08 HISTORY — DX: Viral wart, unspecified: B07.9

## 2021-11-24 DIAGNOSIS — Z87820 Personal history of traumatic brain injury: Secondary | ICD-10-CM | POA: Insufficient documentation

## 2021-11-24 DIAGNOSIS — L409 Psoriasis, unspecified: Secondary | ICD-10-CM | POA: Insufficient documentation

## 2021-11-25 DIAGNOSIS — F1090 Alcohol use, unspecified, uncomplicated: Secondary | ICD-10-CM | POA: Insufficient documentation

## 2021-11-26 DIAGNOSIS — R7989 Other specified abnormal findings of blood chemistry: Secondary | ICD-10-CM | POA: Insufficient documentation

## 2022-01-14 DIAGNOSIS — B079 Viral wart, unspecified: Secondary | ICD-10-CM | POA: Insufficient documentation

## 2022-08-26 ENCOUNTER — Ambulatory Visit: Payer: BLUE CROSS/BLUE SHIELD | Attending: Obstetrics and Gynecology

## 2022-08-26 DIAGNOSIS — Z8481 Family history of carrier of genetic disease: Secondary | ICD-10-CM

## 2022-08-26 DIAGNOSIS — Z31440 Encounter of male for testing for genetic disease carrier status for procreative management: Secondary | ICD-10-CM

## 2022-09-11 ENCOUNTER — Emergency Department (HOSPITAL_COMMUNITY): Payer: BLUE CROSS/BLUE SHIELD

## 2022-09-11 ENCOUNTER — Emergency Department (HOSPITAL_COMMUNITY)
Admission: EM | Admit: 2022-09-11 | Discharge: 2022-09-11 | Disposition: A | Discharge status: Home / Self Care | Payer: BLUE CROSS/BLUE SHIELD | Attending: Emergency Medicine | Admitting: Emergency Medicine

## 2022-09-11 ENCOUNTER — Encounter (HOSPITAL_COMMUNITY): Payer: Self-pay | Admitting: Emergency Medicine

## 2022-09-11 DIAGNOSIS — S91312A Laceration without foreign body, left foot, initial encounter: Secondary | ICD-10-CM | POA: Diagnosis not present

## 2022-09-11 DIAGNOSIS — Z23 Encounter for immunization: Secondary | ICD-10-CM | POA: Diagnosis not present

## 2022-09-11 DIAGNOSIS — W208XXA Other cause of strike by thrown, projected or falling object, initial encounter: Secondary | ICD-10-CM | POA: Diagnosis not present

## 2022-09-11 DIAGNOSIS — S99922A Unspecified injury of left foot, initial encounter: Secondary | ICD-10-CM | POA: Diagnosis present

## 2022-09-11 MED ORDER — TETANUS-DIPHTH-ACELL PERTUSSIS 5-2.5-18.5 LF-MCG/0.5 IM SUSY
0.5000 mL | PREFILLED_SYRINGE | Freq: Once | INTRAMUSCULAR | Status: AC
  Administered 2022-09-11: 0.5 mL via INTRAMUSCULAR
  Filled 2022-09-11: qty 0.5

## 2022-09-11 MED ORDER — CEPHALEXIN 500 MG PO CAPS: 500.0000 mg | ORAL_CAPSULE | Freq: Four times a day (QID) | ORAL | 0 refills | Status: DC

## 2022-09-11 MED ORDER — LIDOCAINE-EPINEPHRINE (PF) 2 %-1:200000 IJ SOLN
10.0000 mL | Freq: Once | INTRAMUSCULAR | Status: AC
  Administered 2022-09-11: 10 mL
  Filled 2022-09-11: qty 20

## 2022-09-11 NOTE — ED Provider Notes (Signed)
[facility] Provider Note   CSN: [id number] Arrival date & time: 09/11/22  1558     History  Chief Complaint  Patient presents with   Laceration    Troy Bishop is a 32 y.o. male.   Laceration    This is a 32 year old male presenting to the emergency department due to laceration to left foot.  He dropped a beer bottle which shattered on his foot, he did not see any glass inside the foot.  Unsure last tetanus.  Denies any paresthesias, able to flex and extend and ambulate on it.  Unsure last tetanus, not on blood thinners.  Home Medications Prior to Admission medications   Medication Sig Start Date End Date Taking? Authorizing Provider  cephALEXin (KEFLEX) 500 MG capsule Take 1 capsule (500 mg total) by mouth 4 (four) times daily. 09/11/22  Yes Sherrill Raring, PA-C  hydrocortisone cream 1 % Apply to face 2 times daily as needed 03/05/21   Volney American, PA-C  ketoconazole (NIZORAL) 2 % cream Apply 1 application topically daily. 06/25/20   [provider]  ketoconazole (NIZORAL) 2 % cream Apply 1 application topically 2 (two) times daily as needed for irritation. Apply to private areas only 03/05/21   Volney American, PA-C  mupirocin ointment (BACTROBAN) 2 % Apply 1 application topically 2 (two) times daily. Patient not taking: No sig reported 05/27/20   Vanessa Kick, MD  omeprazole (PRILOSEC) 20 MG capsule Take 1 capsule (20 mg total) by mouth daily. 07/16/20   Montine Circle, PA-C  phenazopyridine (PYRIDIUM) 200 MG tablet Take 1 tablet (200 mg total) by mouth 3 (three) times daily. Patient not taking: No sig reported 07/03/20   Lacretia Leigh, MD  sucralfate (CARAFATE) 1 g tablet Take 1 tablet (1 g total) by mouth 4 (four) times daily -  with meals and at bedtime. 07/16/20   Montine Circle, PA-C  triamcinolone cream (KENALOG) 0.1 % Apply 1 application topically 2 (two) times daily. Apply to rash on body except face and  private areas 03/05/21   Volney American, PA-C      Allergies    Patient has no known allergies.    Review of Systems   Review of Systems  Physical Exam Updated Vital Signs BP (!) 140/106 (BP Location: Right Arm)   Pulse 77   Temp 98 F (36.7 C)   Resp 16   Ht 5\' 6"  (1.676 m)   Wt 74 kg   SpO2 99%   BMI 26.33 kg/m  Physical Exam Vitals and nursing note reviewed. Exam conducted with a chaperone present.  Constitutional:      General: He is not in acute distress.    Appearance: Normal appearance.  HENT:     Head: Normocephalic and atraumatic.  Eyes:     General: No scleral icterus.    Extraocular Movements: Extraocular movements intact.     Pupils: Pupils are equal, round, and reactive to light.  Cardiovascular:     Pulses: Normal pulses.  Musculoskeletal:        General: No tenderness. Normal range of motion.  Skin:    Capillary Refill: Capillary refill takes less than 2 seconds.     Coloration: Skin is not jaundiced.     Comments: 3 cm laceration dorsum left foot see photo  Neurological:     Mental Status: He is alert. Mental status is at baseline.     Coordination: Coordination normal.     ED  Results / Procedures / Treatments   Labs (all labs ordered are listed, but only abnormal results are displayed) Labs Reviewed - No data to display  EKG None  Radiology DG Foot Complete Left  Result Date: 09/11/2022 CLINICAL DATA:  Laceration, dropped a glass on the foot EXAM: LEFT FOOT - COMPLETE 3+ VIEW COMPARISON:  None Available. FINDINGS: There is no evidence of fracture or dislocation. Partially imaged hardware in the distal tibia. There is no evidence of arthropathy or other focal bone abnormality. Soft tissues swelling about the medial/dorsum of the foot, no radiopaque foreign body. IMPRESSION: Soft tissue swelling about the medial/dorsum of the foot, no radiopaque foreign body. Electronically Signed   By: Keane Police D.O.   On: 09/11/2022 16:50     Procedures .Marland KitchenLaceration Repair  Date/Time: 09/11/2022 5:02 PM  Performed by: Sherrill Raring, PA-C Authorized by: Sherrill Raring, PA-C   Consent:    Consent obtained:  Verbal   Consent given by:  Patient   Risks, benefits, and alternatives were discussed: yes     Risks discussed:  Infection, retained foreign body, need for additional repair, poor cosmetic result, poor wound healing, pain, tendon damage and vascular damage   Alternatives discussed:  No treatment Universal protocol:    Procedure explained and questions answered to patient or proxy's satisfaction: yes     Relevant documents present and verified: yes     Test results available: yes     Imaging studies available: yes     Required blood products, implants, devices, and special equipment available: yes     Site/side marked: yes     Immediately prior to procedure, a time out was called: yes     Patient identity confirmed:  Verbally with patient Anesthesia:    Anesthesia method:  Local infiltration   Local anesthetic:  Lidocaine 1% WITH epi Laceration details:    Location:  Foot   Foot location:  Top of L foot   Length (cm):  3   Depth (mm):  3 Pre-procedure details:    Preparation:  Patient was prepped and draped in usual sterile fashion and imaging obtained to evaluate for foreign bodies Exploration:    Limited defect created (wound extended): no     Hemostasis achieved with:  Direct pressure   Imaging obtained: x-ray     Imaging outcome: foreign body not noted     Wound exploration: wound explored through full range of motion and entire depth of wound visualized     Contaminated: no   Treatment:    Area cleansed with:  Povidone-iodine   Amount of cleaning:  Extensive   Irrigation solution:  Sterile saline   Irrigation volume:  1000   Irrigation method:  Pressure wash   Visualized foreign bodies/material removed: no     Debridement:  None Skin repair:    Repair method:  Sutures   Suture size:  4-0   Suture  material:  Nylon   Suture technique:  Simple interrupted   Number of sutures:  6 Approximation:    Approximation:  Close Repair type:    Repair type:  Simple Post-procedure details:    Dressing:  Non-adherent dressing   Procedure completion:  Tolerated well, no immediate complications     Medications Ordered in ED Medications  lidocaine-EPINEPHrine (XYLOCAINE W/EPI) 2 %-1:200000 (PF) injection 10 mL (10 mLs Infiltration Given 09/11/22 1642)  Tdap (BOOSTRIX) injection 0.5 mL (0.5 mLs Intramuscular Given 09/11/22 1641)    ED Course/ Medical Decision Making/ A&P  Medical Decision Making Amount and/or Complexity of Data Reviewed Radiology: ordered.  Risk Prescription drug management.   This is a 32 year old male presenting to the emergency department due to laceration of left dorsum of foot.  On exam he is neurovascular intact with brisk cap refill, DP and PT are 2+ and he has complete ROM of the digits.  I irrigated extensively and did not palpate any obvious foreign bodies or glass.  Additionally there is no radiopaque foreign body noted on the imaging study.  Patient tolerated suture repair, tetanus was updated.  Will cover with Keflex, orthopedic follow-up was provided.  Instructions were gone over with the patient extensively and all questions were asked and answered.  Stable for discharge at this time.        Final Clinical Impression(s) / ED Diagnoses Final diagnoses:  Laceration of left foot, initial encounter    Rx / DC Orders ED Discharge Orders          Ordered    cephALEXin (KEFLEX) 500 MG capsule  4 times daily        09/11/22 1709              Sherrill Raring, PA-C 09/11/22 1723    Fransico Meadow, MD 09/12/22 [phone]

## 2022-09-11 NOTE — Discharge Instructions (Addendum)
You are seen today in the emergency department due to laceration of your left foot.  Wash with soap and water after 48 hours, change dressing twice daily.  Take the Keflex twice daily with food and water for the next 5 days, this is an antibiotic that will help prevent infection.  You need to be seen by orthopedics, call tomorrow to schedule an appointment in 1 week from now.  Your tetanus was updated today, this is good for the next 10 years.  Return to the ED for fever, swelling, redness, pus.  Your orthopedic doctor can take out the sutures or return back to the emergency department in 12 to 14 days to have the sutures removed.  ?????? ?????? ???????? ??? ?????? ???? ???? ?? ???????? ??????? ?????????? ?? 48 ????? ??? ????? ? ?????? ????????, ????? ??? ??? ??????? ???????? ?????????? ???????????? ???? ? ??????? ????? ??? ??? ????? ? ??????? ????????, ?? ?? ???????????? ?? ???? ??????? ????? ????? ?????? ??????? ?????????????????? ?????? ?????? ?, ??????? 1 ??????? ?????? ??? ?????? ????? ?? ?????????? ??????? ?????? ?? ????? ?????? ????, ?? ????? 10 ?????? ???? ?????? ?? ?????, ????????, ??????, ????? ???? ED ?? ??????????? ??????? ?????????? ???????? ????? ????????? ???? 12 ???? 14 ????? ???????? ??????? ????? ??????????? Tap?'?k? d?br? khu???m? c??a l?g?k? k?ra?a tap?'? ?ja ?patak?l?na vibh?gam? d?khnubha'?k? cha. 48 Gha??? pachi s?buna ra p?n?l? dhunuh?s, dinam? du'? pa?aka ?r?si?a parivartana garnuh?s. K?phl?ksal?'? kh?n? ra p?n?sam?ga dainika du'? pa?aka ark? 5 dinasam'ma linuh?s, y? ?ka ?n?ib?y??ika h? jasal? sa?krama?a r?kna maddata gardacha. Tap?'?l?'? arth?p??iksadv?r? h?rnu ?va?yaka cha, bh?lib??a 1 hapt?m? bh??n? samaya t?lik? ban?'una kala garnuh?s. Tap?'??k? ?i??nasa ?ja apa???a gari'?k? thiy?, y? ark? 10 var?ak? l?gi r?mr? cha. Jvar?, sunnin?, r?t?pana, pipak? l?gi ED m? pharkanuh?s. Tap?'??k? arth?p??ika ??k?aral? ??'[place]? nik?lnak? l?gi 12 d?khi 14 dinam? ?patak?l?na vibh?gam? pharkana  saknuhuncha.

## 2022-09-11 NOTE — ED Triage Notes (Signed)
Pt arrives with laceration to left foot. Pt reports glass fell on his foot. Bleeding controlled at this time. No blood thinners.

## 2022-09-13 ENCOUNTER — Encounter (HOSPITAL_COMMUNITY): Payer: Self-pay

## 2022-09-13 ENCOUNTER — Other Ambulatory Visit: Payer: Self-pay

## 2022-09-13 ENCOUNTER — Emergency Department (HOSPITAL_COMMUNITY): Payer: BLUE CROSS/BLUE SHIELD

## 2022-09-13 ENCOUNTER — Emergency Department (HOSPITAL_COMMUNITY)
Admission: EM | Admit: 2022-09-13 | Discharge: 2022-09-13 | Disposition: A | Discharge status: Home / Self Care | Payer: BLUE CROSS/BLUE SHIELD | Attending: Emergency Medicine | Admitting: Emergency Medicine

## 2022-09-13 DIAGNOSIS — L089 Local infection of the skin and subcutaneous tissue, unspecified: Secondary | ICD-10-CM

## 2022-09-13 DIAGNOSIS — M79672 Pain in left foot: Secondary | ICD-10-CM | POA: Diagnosis present

## 2022-09-13 DIAGNOSIS — L0889 Other specified local infections of the skin and subcutaneous tissue: Secondary | ICD-10-CM | POA: Insufficient documentation

## 2022-09-13 LAB — CBC WITH DIFFERENTIAL/PLATELET
Abs Immature Granulocytes: 0.03 10*3/uL (ref 0.00–0.07)
Basophils Absolute: 0.1 10*3/uL (ref 0.0–0.1)
Basophils Relative: 1 %
Eosinophils Absolute: 0.1 10*3/uL (ref 0.0–0.5)
Eosinophils Relative: 2 %
HCT: 44.6 % (ref 39.0–52.0)
Hemoglobin: 14.5 g/dL (ref 13.0–17.0)
Immature Granulocytes: 0 %
Lymphocytes Relative: 23 %
Lymphs Abs: 1.7 10*3/uL (ref 0.7–4.0)
MCH: 30.5 pg (ref 26.0–34.0)
MCHC: 32.5 g/dL (ref 30.0–36.0)
MCV: 93.7 fL (ref 80.0–100.0)
Monocytes Absolute: 0.8 10*3/uL (ref 0.1–1.0)
Monocytes Relative: 11 %
Neutro Abs: 4.7 10*3/uL (ref 1.7–7.7)
Neutrophils Relative %: 63 %
Platelets: 169 10*3/uL (ref 150–400)
RBC: 4.76 MIL/uL (ref 4.22–5.81)
RDW: 13.9 % (ref 11.5–15.5)
WBC: 7.3 10*3/uL (ref 4.0–10.5)
nRBC: 0 % (ref 0.0–0.2)

## 2022-09-13 LAB — LACTIC ACID, PLASMA: Lactic Acid, Venous: 1.5 mmol/L (ref 0.5–1.9)

## 2022-09-13 LAB — BASIC METABOLIC PANEL
Anion gap: 14 (ref 5–15)
BUN: 7 mg/dL (ref 6–20)
CO2: 22 mmol/L (ref 22–32)
Calcium: 9.1 mg/dL (ref 8.9–10.3)
Chloride: 102 mmol/L (ref 98–111)
Creatinine, Ser: 0.6 mg/dL — ABNORMAL LOW (ref 0.61–1.24)
GFR, Estimated: >60 mL/min (ref >60)
Glucose, Bld: 98 mg/dL (ref 70–99)
Potassium: 3.8 mmol/L (ref 3.5–5.1)
Sodium: 138 mmol/L (ref 135–145)

## 2022-09-13 MED ORDER — DOXYCYCLINE HYCLATE 100 MG PO CAPS: 100.0000 mg | ORAL_CAPSULE | Freq: Two times a day (BID) | ORAL | 0 refills | Status: DC

## 2022-09-13 MED ORDER — HYDROCODONE-ACETAMINOPHEN 5-325 MG PO TABS
1.0000 | ORAL_TABLET | Freq: Once | ORAL | Status: AC
  Administered 2022-09-13: 1 via ORAL
  Filled 2022-09-13: qty 1

## 2022-09-13 MED ORDER — DOXYCYCLINE HYCLATE 100 MG PO TABS
100.0000 mg | ORAL_TABLET | Freq: Once | ORAL | Status: AC
  Administered 2022-09-13: 100 mg via ORAL
  Filled 2022-09-13: qty 1

## 2022-09-13 MED ORDER — OXYCODONE-ACETAMINOPHEN 5-325 MG PO TABS: 1.0000 | ORAL_TABLET | Freq: Four times a day (QID) | ORAL | 0 refills | Status: DC | PRN

## 2022-09-13 NOTE — ED Provider Triage Note (Signed)
Emergency Medicine Provider Triage Evaluation Note  Troy Bishop , a 32 y.o. male  was evaluated in triage.  Pt complains of left foot pain. The patient was here on Sunday for laceration of the foot. He was given antibiotics and was sutured. He reports worsening pain and swelling. He has been cleaning it with water.   Review of Systems  Positive: Negative:   Physical Exam  BP (!) 152/108 (BP Location: Left Arm)   Pulse 92   Temp 99.3 F (37.4 C) (Oral)   Resp 19   Ht 5\' 6"  (1.676 m)   Wt 74 kg   SpO2 96%   BMI 26.33 kg/m  Gen:   Awake, no distress   Resp:  Normal effort  MSK:   Moves extremities without difficulty  Other:  Sutures in place. Surround swelling and some erythema. No red streaking. No purulent drainage noted. Palpable pulses. Soft Compartments.   Medical Decision Making  Medically screening exam initiated at 12:16 PM.  Appropriate orders placed.  Troy Bishop was informed that the remainder of the evaluation will be completed by another provider, this initial triage assessment does not replace that evaluation, and the importance of remaining in the ED until their evaluation is complete.  Labs and XR ordered   Troy Bishop, [place] Troy Bishop 1218

## 2022-09-13 NOTE — ED Notes (Signed)
Patient's stitches were removed by the provider; wound cleansed with normal saline and betadine.

## 2022-09-13 NOTE — ED Provider Notes (Signed)
[facility] Provider Note   CSN: [id number] Arrival date & time: 09/13/22  1151     History  Chief Complaint  Patient presents with   Foot Pain   HPI Troy Bishop is a 32 y.o. male presenting for left foot pain.  Patient lacerated his left foot after dropping a shattered beer bottle on it.  Was evaluated for this laceration 2 days ago.  Wound was appropriately irrigated and cleaned and then closed with sutures.  Patient was started on Keflex for empiric treatment of associated infection.  In the last 24 hours patient endorses worsening swelling and pain.  Now stating that he is having difficulty walking on the left foot.  Endorses normal range of motion and sensation of the left foot.  States he has been taking his Keflex but has "missed some doses".  Denies fever and chills.   Foot Pain       Home Medications Prior to Admission medications   Medication Sig Start Date End Date Taking? Authorizing Provider  doxycycline (VIBRAMYCIN) 100 MG capsule Take 1 capsule (100 mg total) by mouth 2 (two) times daily. 09/13/22  Yes Harriet Pho, PA-C  cephALEXin (KEFLEX) 500 MG capsule Take 1 capsule (500 mg total) by mouth 4 (four) times daily. 09/11/22   Sherrill Raring, PA-C  hydrocortisone cream 1 % Apply to face 2 times daily as needed 03/05/21   Volney American, PA-C  ketoconazole (NIZORAL) 2 % cream Apply 1 application topically daily. 06/25/20   [provider]  ketoconazole (NIZORAL) 2 % cream Apply 1 application topically 2 (two) times daily as needed for irritation. Apply to private areas only 03/05/21   Volney American, PA-C  mupirocin ointment (BACTROBAN) 2 % Apply 1 application topically 2 (two) times daily. Patient not taking: No sig reported 05/27/20   Vanessa Kick, MD  omeprazole (PRILOSEC) 20 MG capsule Take 1 capsule (20 mg total) by mouth daily. 07/16/20   Montine Circle, PA-C  phenazopyridine (PYRIDIUM) 200 MG  tablet Take 1 tablet (200 mg total) by mouth 3 (three) times daily. Patient not taking: No sig reported 07/03/20   Lacretia Leigh, MD  sucralfate (CARAFATE) 1 g tablet Take 1 tablet (1 g total) by mouth 4 (four) times daily -  with meals and at bedtime. 07/16/20   Montine Circle, PA-C  triamcinolone cream (KENALOG) 0.1 % Apply 1 application topically 2 (two) times daily. Apply to rash on body except face and private areas 03/05/21   Volney American, PA-C      Allergies    Patient has no known allergies.    Review of Systems   Review of Systems  Physical Exam Updated Vital Signs BP (!) 152/108 (BP Location: Left Arm)   Pulse 92   Temp 98.4 F (36.9 C) (Oral)   Resp 19   Ht 5' 6"$  (1.676 m)   Wt 74 kg   SpO2 96%   BMI 26.33 kg/m  Physical Exam Constitutional:      Appearance: Normal appearance.  HENT:     Head: Normocephalic.     Nose: Nose normal.  Eyes:     Conjunctiva/sclera: Conjunctivae normal.  Pulmonary:     Effort: Pulmonary effort is normal.  Musculoskeletal:     Comments: Left foot: Laceration site is well-approximated, not oozing or bleeding.  There is swelling erythema and warmth beginning at the site and extending to the toes and just above the ankle joint.  No  notable streaking erythema up the leg.  Neurological:     Mental Status: He is alert.  Psychiatric:        Mood and Affect: Mood normal.        ED Results / Procedures / Treatments   Labs (all labs ordered are listed, but only abnormal results are displayed) Labs Reviewed  BASIC METABOLIC PANEL - Abnormal; Notable for the following components:      Result Value   Creatinine, Ser 0.60 (*)    All other components within normal limits  CBC WITH DIFFERENTIAL/PLATELET  LACTIC ACID, PLASMA  LACTIC ACID, PLASMA    EKG None  Radiology DG Foot Complete Left  Result Date: 09/13/2022 CLINICAL DATA:  swelling and erythema s/p sutures EXAM: LEFT FOOT - COMPLETE 3+ VIEW COMPARISON:  September 11, 2022 FINDINGS: No acute fracture or dislocation. Joint spaces and alignment are maintained. No area of erosion or osseous destruction. No unexpected radiopaque foreign body. Soft tissue edema. IMPRESSION: 1. No acute fracture or dislocation. 2. Soft tissue edema, increased. Electronically Signed   By: Valentino Saxon M.D.   On: 09/13/2022 12:55    Procedures .Suture Removal  Date/Time: 09/13/2022 5:48 PM  Performed by: Harriet Pho, PA-C Authorized by: Harriet Pho, PA-C   Consent:    Consent obtained:  Verbal   Consent given by:  Patient   Risks discussed:  Bleeding   Alternatives discussed:  No treatment Universal protocol:    Procedure explained and questions answered to patient or proxy's satisfaction: yes     Patient identity confirmed:  Verbally with patient and arm band Location:    Location:  Lower extremity   Lower extremity location:  Foot   Foot location:  L foot Procedure details:    Wound appearance:  Nonpurulent, draining, tender, warm and red   Drainage characteristics:  Sanguinous   Number of sutures removed:  6 Post-procedure details:    Post-removal:  Steri-Strips applied and dressing applied   Procedure completion:  Tolerated well, no immediate complications     Medications Ordered in ED Medications  HYDROcodone-acetaminophen (NORCO/VICODIN) 5-325 MG per tablet 1 tablet (1 tablet Oral Given 09/13/22 1438)  HYDROcodone-acetaminophen (NORCO/VICODIN) 5-325 MG per tablet 1 tablet (1 tablet Oral Given 09/13/22 1545)  doxycycline (VIBRA-TABS) tablet 100 mg (100 mg Oral Given 09/13/22 1545)    ED Course/ Medical Decision Making/ A&P Clinical Course as of 09/13/22 1752  Tue Sep 13, 2022  1347 Immature Granulocytes: 0 [JR]    Clinical Course User Index [JR] Harriet Pho, PA-C                             Medical Decision Making Amount and/or Complexity of Data Reviewed Labs: ordered. Decision-making details documented in ED  Course.  Risk Prescription drug management.   Initial Impression and Ddx 31 year old male with obvious swelling, erythema and warmth in his left foot after recent laceration repair.  Overall well-appearing and hemodynamically stable.  Exam concerning for infection in his left foot beginning around the surgical site extending downward to the toes and up towards the ankle.  DDx includes cellulitis, osteomyelitis, bacteremia, and sepsis. Patient PMH that increases complexity of ED encounter: Recent laceration repair on Keflex  Interpretation of Diagnostics I independent reviewed and interpreted the labs as followed: No acute derangement  - I independently visualized the following imaging with scope of interpretation limited to determining acute life threatening conditions related to  emergency care: X-ray, which revealed edema in the left foot   Patient Reassessment and Ultimate Disposition/Management Exam findings concerning for infection in his left foot secondary to recent laceration.  Patient states that he has been compliant with his Keflex.  Reopen wound for source control.  Oozing was mild and sanguinous without pustular discharge.  Considered sepsis but unlikely given no white count, afebrile and normal vitals.  Symptoms consistent with a cellulitis in the left foot.  Close supple with Steri-Strips.  Started him on Doxy.  Advised him to take both Doxy and Keflex together.  Discussed appropriate wound care at home.  Advised him to follow-up with his PCP in 3 to 4 days for reevaluation.  Discussed return precautions.  Applied dressing and crutches.  Treated pain.  Sent doxycycline and a few tabs of Percocet to his pharmacy.  Patient management required discussion with the following services or consulting groups:  None  Complexity of Problems Addressed Acute complicated illness or Injury  Additional Data Reviewed and Analyzed Further history obtained from: Past medical history and  medications listed in the EMR and Prior ED visit notes  Patient Encounter Risk Assessment Prescriptions         Final Clinical Impression(s) / ED Diagnoses Final diagnoses:  Wound infection    Rx / DC Orders ED Discharge Orders          Ordered    doxycycline (VIBRAMYCIN) 100 MG capsule  2 times daily        09/13/22 1529              Harriet Pho, PA-C 09/13/22 Valerie Roys, MD 09/16/22 [phone]

## 2022-09-13 NOTE — Discharge Instructions (Addendum)
Evaluation of your foot today revealed that it likely is still infected.  I have started you on another antibiotic, doxycycline that you will take in addition with your Keflex.  Also recommend that you clean the wound at least once a day with soap and water.  Recommend you follow-up with your PCP for reevaluation in 3 to 4 days.  You can take Percocet for acute pain in the next 3 days.  But otherwise continue Tylenol and ibuprofen.  If you have new fever, AMS, worsening swelling, pain or redness or any other concerning symptom please return to the emergency department for further evaluation.  Sent doxycycline and Percocet to your pharmacy.

## 2022-09-13 NOTE — ED Triage Notes (Signed)
Pt states had left foot injury where the glass cut his foot and he got stitches 2days ago. Pt c/o pain. Pt states unable to bear wt on foot due to pain. Pt states he hasn't washed to wound. He just put the cream on it each day and that's it. Pt's foot has 2+ swelling, the area around the wound is hot to touch. The wound is intact and stitches are intact. Pt has 2+ left pedal pulse, cap refill less than 2 sec, pt able to wiggle toes.

## 2022-10-11 ENCOUNTER — Other Ambulatory Visit: Payer: Self-pay

## 2022-12-08 ENCOUNTER — Encounter (HOSPITAL_COMMUNITY): Payer: Self-pay

## 2022-12-08 ENCOUNTER — Ambulatory Visit (HOSPITAL_COMMUNITY)
Admission: EM | Admit: 2022-12-08 | Discharge: 2022-12-08 | Disposition: A | Discharge status: Home / Self Care | Payer: BLUE CROSS/BLUE SHIELD | Attending: Internal Medicine | Admitting: Internal Medicine

## 2022-12-08 DIAGNOSIS — K2920 Alcoholic gastritis without bleeding: Secondary | ICD-10-CM | POA: Insufficient documentation

## 2022-12-08 DIAGNOSIS — G4700 Insomnia, unspecified: Secondary | ICD-10-CM | POA: Diagnosis not present

## 2022-12-08 DIAGNOSIS — F10230 Alcohol dependence with withdrawal, uncomplicated: Secondary | ICD-10-CM | POA: Insufficient documentation

## 2022-12-08 DIAGNOSIS — F10930 Alcohol use, unspecified with withdrawal, uncomplicated: Secondary | ICD-10-CM

## 2022-12-08 DIAGNOSIS — R1013 Epigastric pain: Secondary | ICD-10-CM | POA: Insufficient documentation

## 2022-12-08 DIAGNOSIS — Z79899 Other long term (current) drug therapy: Secondary | ICD-10-CM | POA: Insufficient documentation

## 2022-12-08 LAB — CBC WITH DIFFERENTIAL/PLATELET
Abs Immature Granulocytes: 0.01 10*3/uL (ref 0.00–0.07)
Basophils Absolute: 0 10*3/uL (ref 0.0–0.1)
Basophils Relative: 1 %
Eosinophils Absolute: 0.2 10*3/uL (ref 0.0–0.5)
Eosinophils Relative: 7 %
HCT: 42 % (ref 39.0–52.0)
Hemoglobin: 13.8 g/dL (ref 13.0–17.0)
Immature Granulocytes: 0 %
Lymphocytes Relative: 34 %
Lymphs Abs: 0.8 10*3/uL (ref 0.7–4.0)
MCH: 30.8 pg (ref 26.0–34.0)
MCHC: 32.9 g/dL (ref 30.0–36.0)
MCV: 93.8 fL (ref 80.0–100.0)
Monocytes Absolute: 0.2 10*3/uL (ref 0.1–1.0)
Monocytes Relative: 10 %
Neutro Abs: 1.2 10*3/uL — ABNORMAL LOW (ref 1.7–7.7)
Neutrophils Relative %: 48 %
Platelets: 49 10*3/uL — ABNORMAL LOW (ref 150–400)
RBC: 4.48 MIL/uL (ref 4.22–5.81)
RDW: 13.9 % (ref 11.5–15.5)
WBC: 2.4 10*3/uL — ABNORMAL LOW (ref 4.0–10.5)
nRBC: 0 % (ref 0.0–0.2)

## 2022-12-08 LAB — COMPREHENSIVE METABOLIC PANEL
ALT: 439 U/L — ABNORMAL HIGH (ref 0–44)
AST: 928 U/L — ABNORMAL HIGH (ref 15–41)
Albumin: 4.4 g/dL (ref 3.5–5.0)
Alkaline Phosphatase: 90 U/L (ref 38–126)
Anion gap: 10 (ref 5–15)
BUN: 5 mg/dL — ABNORMAL LOW (ref 6–20)
CO2: 26 mmol/L (ref 22–32)
Calcium: 9.5 mg/dL (ref 8.9–10.3)
Chloride: 105 mmol/L (ref 98–111)
Creatinine, Ser: 0.72 mg/dL (ref 0.61–1.24)
GFR, Estimated: >60 mL/min (ref >60)
Glucose, Bld: 91 mg/dL (ref 70–99)
Potassium: 3.2 mmol/L — ABNORMAL LOW (ref 3.5–5.1)
Sodium: 141 mmol/L (ref 135–145)
Total Bilirubin: 1.5 mg/dL — ABNORMAL HIGH (ref 0.3–1.2)
Total Protein: 8.7 g/dL — ABNORMAL HIGH (ref 6.5–8.1)

## 2022-12-08 LAB — LIPASE, BLOOD: Lipase: 38 U/L (ref 11–51)

## 2022-12-08 MED ORDER — THIAMINE HCL 100 MG PO TABS: 100.0000 mg | ORAL_TABLET | Freq: Every day | ORAL | 0 refills | Status: DC

## 2022-12-08 MED ORDER — CHLORDIAZEPOXIDE HCL 25 MG PO CAPS: ORAL_CAPSULE | ORAL | 0 refills | Status: DC

## 2022-12-08 MED ORDER — THERA VITAL M PO TABS: 1.0000 | ORAL_TABLET | Freq: Every day | ORAL | 0 refills | Status: DC

## 2022-12-08 MED ORDER — ONDANSETRON 4 MG PO TBDP: 4.0000 mg | ORAL_TABLET | Freq: Three times a day (TID) | ORAL | 0 refills | Status: DC | PRN

## 2022-12-08 MED ORDER — PANTOPRAZOLE SODIUM 20 MG PO TBEC: 20.0000 mg | DELAYED_RELEASE_TABLET | Freq: Every day | ORAL | 0 refills | Status: DC
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
## 2022-12-08 NOTE — ED Triage Notes (Signed)
Pt states he hasn't slept in the past 4 nights.  States he is trying to quit drinking alcohol. State he tried an OTC sleep medication but it did not help.

## 2022-12-08 NOTE — Discharge Instructions (Addendum)
Maintain adequate hydration Your symptoms are consistent with alcohol withdrawal Please take medications as prescribed Please follow-up with behavioral health for alcohol withdrawal management.

## 2022-12-09 ENCOUNTER — Telehealth (HOSPITAL_COMMUNITY): Payer: Self-pay | Admitting: Emergency Medicine

## 2022-12-09 NOTE — Telephone Encounter (Signed)
Per Dr. Rachael Darby "Pt needs to be seen today in the emergency department for several reasons: leukopenia, thrombocytopenia, acute hepatitis"  Attempted to reach patient x 1 using a Nepali interpreter, LVM Provided urgent care main number for results in case patient calls after I'm gone for the day

## 2022-12-12 ENCOUNTER — Telehealth (HOSPITAL_COMMUNITY): Payer: Self-pay | Admitting: Emergency Medicine

## 2022-12-12 ENCOUNTER — Encounter (HOSPITAL_COMMUNITY): Payer: Self-pay

## 2022-12-12 ENCOUNTER — Other Ambulatory Visit: Payer: Self-pay

## 2022-12-12 ENCOUNTER — Emergency Department (HOSPITAL_COMMUNITY)
Admission: EM | Admit: 2022-12-12 | Discharge: 2022-12-12 | Disposition: A | Discharge status: Home / Self Care | Payer: BLUE CROSS/BLUE SHIELD | Attending: Emergency Medicine | Admitting: Emergency Medicine

## 2022-12-12 DIAGNOSIS — R748 Abnormal levels of other serum enzymes: Secondary | ICD-10-CM | POA: Diagnosis present

## 2022-12-12 DIAGNOSIS — Y908 Blood alcohol level of 240 mg/100 ml or more: Secondary | ICD-10-CM | POA: Diagnosis not present

## 2022-12-12 DIAGNOSIS — F1010 Alcohol abuse, uncomplicated: Secondary | ICD-10-CM | POA: Diagnosis not present

## 2022-12-12 LAB — COMPREHENSIVE METABOLIC PANEL
ALT: 320 U/L — ABNORMAL HIGH (ref 0–44)
AST: 325 U/L — ABNORMAL HIGH (ref 15–41)
Albumin: 4.2 g/dL (ref 3.5–5.0)
Alkaline Phosphatase: 91 U/L (ref 38–126)
Anion gap: 11 (ref 5–15)
BUN: <5 mg/dL — ABNORMAL LOW (ref 6–20)
CO2: 25 mmol/L (ref 22–32)
Calcium: 8.8 mg/dL — ABNORMAL LOW (ref 8.9–10.3)
Chloride: 108 mmol/L (ref 98–111)
Creatinine, Ser: 0.68 mg/dL (ref 0.61–1.24)
GFR, Estimated: >60 mL/min (ref >60)
Glucose, Bld: 120 mg/dL — ABNORMAL HIGH (ref 70–99)
Potassium: 3.9 mmol/L (ref 3.5–5.1)
Sodium: 144 mmol/L (ref 135–145)
Total Bilirubin: 0.7 mg/dL (ref 0.3–1.2)
Total Protein: 8.3 g/dL — ABNORMAL HIGH (ref 6.5–8.1)

## 2022-12-12 LAB — CBC WITH DIFFERENTIAL/PLATELET
Abs Immature Granulocytes: 0.01 10*3/uL (ref 0.00–0.07)
Basophils Absolute: 0.1 10*3/uL (ref 0.0–0.1)
Basophils Relative: 2 %
Eosinophils Absolute: 0.1 10*3/uL (ref 0.0–0.5)
Eosinophils Relative: 3 %
HCT: 41.7 % (ref 39.0–52.0)
Hemoglobin: 13.6 g/dL (ref 13.0–17.0)
Immature Granulocytes: 0 %
Lymphocytes Relative: 40 %
Lymphs Abs: 1.2 10*3/uL (ref 0.7–4.0)
MCH: 31.1 pg (ref 26.0–34.0)
MCHC: 32.6 g/dL (ref 30.0–36.0)
MCV: 95.4 fL (ref 80.0–100.0)
Monocytes Absolute: 0.3 10*3/uL (ref 0.1–1.0)
Monocytes Relative: 11 %
Neutro Abs: 1.3 10*3/uL — ABNORMAL LOW (ref 1.7–7.7)
Neutrophils Relative %: 44 %
Platelets: 123 10*3/uL — ABNORMAL LOW (ref 150–400)
RBC: 4.37 MIL/uL (ref 4.22–5.81)
RDW: 14.6 % (ref 11.5–15.5)
WBC: 3 10*3/uL — ABNORMAL LOW (ref 4.0–10.5)
nRBC: 0 % (ref 0.0–0.2)

## 2022-12-12 LAB — ETHANOL: Alcohol, Ethyl (B): 356 mg/dL (ref <10)

## 2022-12-12 LAB — PROTIME-INR
INR: 1 (ref 0.8–1.2)
Prothrombin Time: 13 seconds (ref 11.4–15.2)

## 2022-12-12 LAB — AMMONIA: Ammonia: 58 umol/L — ABNORMAL HIGH (ref 9–35)

## 2022-12-12 MED ORDER — THIAMINE MONONITRATE 100 MG PO TABS: 100.0000 mg | ORAL_TABLET | Freq: Every day | ORAL | Status: DC

## 2022-12-12 MED ORDER — LORAZEPAM 1 MG PO TABS: 0.0000 mg | ORAL_TABLET | Freq: Four times a day (QID) | ORAL | Status: DC

## 2022-12-12 MED ORDER — LORAZEPAM 1 MG PO TABS: 2.0000 mg | ORAL_TABLET | Freq: Two times a day (BID) | ORAL | Status: DC

## 2022-12-12 MED ORDER — LORAZEPAM 1 MG PO TABS: 0.0000 mg | ORAL_TABLET | Freq: Two times a day (BID) | ORAL | Status: DC

## 2022-12-12 MED ORDER — LORAZEPAM 1 MG PO TABS
2.0000 mg | ORAL_TABLET | Freq: Four times a day (QID) | ORAL | Status: DC
  Administered 2022-12-12: 2 mg via ORAL
  Filled 2022-12-12: qty 2

## 2022-12-12 MED ORDER — THIAMINE HCL 100 MG/ML IJ SOLN
100.0000 mg | Freq: Every day | INTRAMUSCULAR | Status: DC
  Administered 2022-12-12: 100 mg via INTRAVENOUS
  Filled 2022-12-12: qty 2

## 2022-12-12 NOTE — ED Provider Triage Note (Signed)
Emergency Medicine Provider Triage Evaluation Note  Aimee Koeppe , a 32 y.o. male  was evaluated in triage.  Sent from urgent care due to elevated liver enzymes.  History of alcohol abuse.  He originally went to urgent care due to fatigue and decreased appetite  Review of Systems  Positive:  Negative:   Physical Exam  BP (!) 146/111   Pulse (!) 103   Temp 98.2 F (36.8 C) (Oral)   Resp 18   Wt 73.9 kg   SpO2 98%   BMI 26.31 kg/m  Gen:   Awake, no distress   Resp:  Normal effort  MSK:   Moves extremities without difficulty  Other:    Medical Decision Making  Medically screening exam initiated at 7:43 PM.  Appropriate orders placed.  Raoul Eberlein was informed that the remainder of the evaluation will be completed by another provider, this initial triage assessment does not replace that evaluation, and the importance of remaining in the ED until their evaluation is complete.     Saddie Benders, [place] 12/12/22 1944

## 2022-12-12 NOTE — ED Triage Notes (Addendum)
Pt arrives due to being sent here from UC. Per pt, he has been had more fatigued and decreased appetite lately. He had abnormal platelet and liver labs at [facility] and was instructed to come to ER. Pt a&ox4. Pt does speak english. Pt has hx of alcohol abuse and his last drink was this morning.

## 2022-12-12 NOTE — Discharge Instructions (Signed)
As we discussed, it is extremely important that you fill the Librium taper at that urgent care gave you a prescription for.  Please stop drinking alcohol and then start the medication and take it as prescribed in its entirety and do not drink any more alcohol. I have given you a referral to gastroenterology with a number to call to schedule an appointment for continued evaluation of your elevated liver enzymes.  This is almost definitely due to your chronic alcohol abuse and will only get worse if you do not stop drinking alcohol.  Return if development of any new or worsening symptoms.

## 2022-12-12 NOTE — ED Provider Notes (Signed)
[place] EMERGENCY DEPARTMENT AT [facility] Provider Note   CSN: [id number] Arrival date & time: 12/12/22  1856     History  Chief Complaint  Patient presents with   Abnormal Labs    Troy Bishop is a 32 y.o. male.  Patient with history of alcohol abuse presents today from urgent care with concerns for elevated liver enzymes.  He originally went to urgent care on 5/2 with concerns for difficulty sleeping.  He reportedly drinks 1 bottle of [employer] whiskey every 5 days.  States that he had half a bottle this morning.  He states that he went to urgent care for assistance stopping drinking alcohol and was given a Librium taper for this. He did not fill this prescription. He has continued to drink alcohol and drank half a bottle of whiskey this morning. Patient currently has no symptoms, he only states that he cannot sleep when he doesn't drink alcohol. He denies nausea, vomiting, diarrhea. He is not confused.  Urgent care's note does mention episodes of abdominal pain, however patient repeatedly denies this. He denies fevers or chills. No unintentional weight loss or jaundice. No chest pain or shortness of breath. Denies any unusual bleeding.   The history is provided by the patient. No language interpreter was used.       Home Medications Prior to Admission medications   Medication Sig Start Date End Date Taking? Authorizing Provider  chlordiazePOXIDE (LIBRIUM) 25 MG capsule 50mg  PO TID x 1D, then 25-50mg  PO BID X 1D, then 25-50mg  PO QD X 1D 12/08/22   Lamptey, Britta Mccreedy, MD  ketoconazole (NIZORAL) 2 % cream Apply 1 application topically 2 (two) times daily as needed for irritation. Apply to private areas only 03/05/21   Particia Nearing, PA-C  Multiple Vitamins-Minerals (MULTIVITAMIN) tablet Take 1 tablet by mouth daily. 12/08/22   LampteyBritta Mccreedy, MD  mupirocin ointment (BACTROBAN) 2 % Apply 1 application topically 2 (two) times daily. Patient not taking: No sig  reported 05/27/20   Mardella Layman, MD  ondansetron (ZOFRAN-ODT) 4 MG disintegrating tablet Take 1 tablet (4 mg total) by mouth every 8 (eight) hours as needed for nausea or vomiting. 12/08/22   Merrilee Jansky, MD  pantoprazole (PROTONIX) 20 MG tablet Take 1 tablet (20 mg total) by mouth daily. 12/08/22   Lamptey, Britta Mccreedy, MD  phenazopyridine (PYRIDIUM) 200 MG tablet Take 1 tablet (200 mg total) by mouth 3 (three) times daily. Patient not taking: No sig reported 07/03/20   Lorre Nick, MD  thiamine (VITAMIN B1) 100 MG tablet Take 1 tablet (100 mg total) by mouth daily. 12/08/22   Lamptey, Britta Mccreedy, MD      Allergies    Patient has no known allergies.    Review of Systems   Review of Systems  All other systems reviewed and are negative.   Physical Exam Updated Vital Signs BP (!) 139/107   Pulse 77   Temp 98.2 F (36.8 C) (Oral)   Resp (!) 23   Wt 73.9 kg   SpO2 100%   BMI 26.31 kg/m  Physical Exam Vitals and nursing note reviewed.  Constitutional:      General: He is not in acute distress.    Appearance: Normal appearance. He is normal weight. He is not ill-appearing, toxic-appearing or diaphoretic.  HENT:     Head: Normocephalic and atraumatic.  Eyes:     Extraocular Movements: Extraocular movements intact.     Pupils: Pupils are equal, round,  and reactive to light.  Cardiovascular:     Rate and Rhythm: Normal rate and regular rhythm.     Heart sounds: Normal heart sounds.  Pulmonary:     Effort: Pulmonary effort is normal. No respiratory distress.     Breath sounds: Normal breath sounds.  Abdominal:     General: Abdomen is flat.     Palpations: Abdomen is soft.     Tenderness: There is no abdominal tenderness.  Musculoskeletal:        General: Normal range of motion.     Cervical back: Normal range of motion and neck supple.     Right lower leg: No edema.     Left lower leg: No edema.  Skin:    General: Skin is warm and dry.  Neurological:     General: No focal  deficit present.     Mental Status: He is alert and oriented to person, place, and time.     GCS: GCS eye subscore is 4. GCS verbal subscore is 5. GCS motor subscore is 6.     Gait: Gait is intact.  Psychiatric:        Mood and Affect: Mood normal.        Behavior: Behavior normal.     ED Results / Procedures / Treatments   Labs (all labs ordered are listed, but only abnormal results are displayed) Labs Reviewed  CBC WITH DIFFERENTIAL/PLATELET - Abnormal; Notable for the following components:      Result Value   WBC 3.0 (*)    Platelets 123 (*)    Neutro Abs 1.3 (*)    All other components within normal limits  COMPREHENSIVE METABOLIC PANEL - Abnormal; Notable for the following components:   Glucose, Bld 120 (*)    BUN <5 (*)    Calcium 8.8 (*)    Total Protein 8.3 (*)    AST 325 (*)    ALT 320 (*)    All other components within normal limits  AMMONIA - Abnormal; Notable for the following components:   Ammonia 58 (*)    All other components within normal limits  ETHANOL - Abnormal; Notable for the following components:   Alcohol, Ethyl (B) 356 (*)    All other components within normal limits  PROTIME-INR    EKG EKG Interpretation  Date/Time:  Monday Dec 12 2022 19:16:30 EDT Ventricular Rate:  99 PR Interval:  138 QRS Duration: 94 QT Interval:  354 QTC Calculation: 454 R Axis:   2 Text Interpretation: Normal sinus rhythm Normal ECG When compared with ECG of 15-Jul-2020 22:44, No significant change since last tracing Confirmed by Alvira Monday ([id number]) on 12/12/2022 10:09:54 PM  Radiology No results found.  Procedures Procedures    Medications Ordered in ED Medications  LORazepam (ATIVAN) tablet 2 mg (2 mg Oral Given 12/12/22 2120)    Or  LORazepam (ATIVAN) tablet 0-4 mg ( Oral See Alternative 12/12/22 2120)  LORazepam (ATIVAN) tablet 2 mg (has no administration in time range)    Or  LORazepam (ATIVAN) tablet 0-4 mg (has no administration in time range)   thiamine (VITAMIN B1) tablet 100 mg ( Oral See Alternative 12/12/22 2120)    Or  thiamine (VITAMIN B1) injection 100 mg (100 mg Intravenous Given 12/12/22 2120)    ED Course/ Medical Decision Making/ A&P                             Medical  Decision Making Amount and/or Complexity of Data Reviewed Labs: ordered.   This patient is a 32 y.o. male who presents to the ED for concern of elevated liver enzymes, this involves an extensive number of treatment options, and is a complaint that carries with it a high risk of complications and morbidity. The emergent differential diagnosis prior to evaluation includes, but is not limited to, alcohol induced liver disease, pancreatitis, cholecystitis, choledocholithiasis, neoplasm, hepatitis. This is not an exhaustive differential.   Past Medical History / Co-morbidities / Social History: Hx alcohol abuse  Additional history: Chart reviewed. Pertinent results include: Patient went to urgent care on 5/2 and had CBC, CMP, and lipase drawn at that time.  Results revealed a platelet count of 49, AST 928, ALT 439, T. bili 1.5.  Alk phos and lipase within normal limits.  Patient was sent home with Protonix, Zofran, thiamine, and Librium prescriptions from urgent care  Physical Exam: Physical exam performed. The pertinent findings include: Alert and oriented and neurologically intact without focal deficits, abdomen soft and nontender.  Lab Tests: I ordered, and personally interpreted labs.  The pertinent results include:  platelets 123, AST 325, ALT 320, T bili WNL. Ammonia 58, ethanol 356   Cardiac Monitoring:  The patient was maintained on a cardiac monitor.  My attending physician Dr. Dalene Seltzer viewed and interpreted the cardiac monitored which showed an underlying rhythm of: NSR. I agree with this interpretation.   Medications: I ordered medication including ativan, thiamine  for alcohol abuse. Reevaluation of the patient after these medicines showed  that the patient improved. I have reviewed the patients home medicines and have made adjustments as needed.   Disposition: After consideration of the diagnostic results and the patients response to treatment, I feel that emergency department workup does not suggest an emergent condition requiring admission or immediate intervention beyond what has been performed at this time. The plan is: discharge with GI referral and resources for alcohol cessation.  Patient is currently not in withdrawal and is clinically sober after waiting 4 hours to be seen after labs were drawn.  He is not encephalopathic.  He is not having any abdominal pain to warrant imaging at this time.  His liver enzymes are trending down. Evaluation and diagnostic testing in the emergency department does not suggest an emergent condition requiring admission or immediate intervention beyond what has been performed at this time.  Plan for discharge with close PCP follow-up.  I have emphasized the importance of filling the librium prescription and taking it as prescribed as well as repeatedly discussed the importance of alcohol cessation. Patient is understanding and amenable with plan, educated on red flag symptoms that would prompt immediate return.  Patient discharged in stable condition.   I discussed this case with my attending physician Dr. Dalene Seltzer who cosigned this note including patient's presenting symptoms, physical exam, and planned diagnostics and interventions. Attending physician stated agreement with plan or made changes to plan which were implemented.     Final Clinical Impression(s) / ED Diagnoses Final diagnoses:  Elevated liver enzymes  Alcohol abuse    Rx / DC Orders ED Discharge Orders     None     An After Visit Summary was printed and given to the patient.     Vear Clock 12/12/22 2258    Alvira Monday, MD 12/13/22 1510

## 2022-12-12 NOTE — Telephone Encounter (Signed)
Patient is being sent postdischarge from Urgent Care and sent to the Emergency Department via private vehicle . Per Dr. Rachael Darby, patient is in need of higher level of care due to leukopenia, thrombocytopenia, acute hepatitis . Patient is aware and verbalizes understanding of plan of care.

## 2023-02-24 ENCOUNTER — Telehealth: Payer: Self-pay

## 2023-02-24 NOTE — Telephone Encounter (Signed)
Patient was scheduled with Dr. Adela Lank by mistake. He is a patient of Dr. Myrtie Neither. Has an appointment on Tues, 7-23 with Dr. Adela Lank for [facility] F/U Elevated Liver Enzymes.  Called patient and left message to see if he could come on Monday, 7-29 instead of Tues 7-23.  There are several APPs he could see that day. Asked him to call asap to confirm

## 2023-02-27 NOTE — Telephone Encounter (Signed)
MyChart message sent to pt

## 2023-02-27 NOTE — Telephone Encounter (Signed)
Called and left another message for the patient that his appointment on 7-23 needs to be rescheduled as he is a Copy patient.  There is an opening this Thursday 7-25 at 1:30pm with Sinda Du or he can come on Monday, 7-29. Asked patient to call asap

## 2023-02-28 ENCOUNTER — Ambulatory Visit: Payer: BLUE CROSS/BLUE SHIELD | Admitting: Gastroenterology

## 2023-02-28 NOTE — Telephone Encounter (Signed)
Called and spoke to patient.  He expressed understanding and can come on Thursday, 7-25 to see J Lemmon at 1:30pm.

## 2023-03-02 ENCOUNTER — Other Ambulatory Visit (INDEPENDENT_AMBULATORY_CARE_PROVIDER_SITE_OTHER): Payer: BLUE CROSS/BLUE SHIELD

## 2023-03-02 ENCOUNTER — Ambulatory Visit (INDEPENDENT_AMBULATORY_CARE_PROVIDER_SITE_OTHER): Payer: BLUE CROSS/BLUE SHIELD | Admitting: Physician Assistant

## 2023-03-02 ENCOUNTER — Encounter: Payer: Self-pay | Admitting: Physician Assistant

## 2023-03-02 VITALS — BP 150/88 | HR 106 | Ht 66.0 in | Wt 134.0 lb

## 2023-03-02 DIAGNOSIS — F1010 Alcohol abuse, uncomplicated: Secondary | ICD-10-CM

## 2023-03-02 DIAGNOSIS — R7989 Other specified abnormal findings of blood chemistry: Secondary | ICD-10-CM

## 2023-03-02 LAB — COMPREHENSIVE METABOLIC PANEL
ALT: 200 U/L — ABNORMAL HIGH (ref 0–53)
AST: 302 U/L — ABNORMAL HIGH (ref 0–37)
Albumin: 4.8 g/dL (ref 3.5–5.2)
Alkaline Phosphatase: 93 U/L (ref 39–117)
BUN: 5 mg/dL — ABNORMAL LOW (ref 6–23)
CO2: 26 mEq/L (ref 19–32)
Calcium: 9.6 mg/dL (ref 8.4–10.5)
Chloride: 103 mEq/L (ref 96–112)
Creatinine, Ser: 0.67 mg/dL (ref 0.40–1.50)
GFR: 122.63 mL/min (ref >60.00)
Glucose, Bld: 126 mg/dL — ABNORMAL HIGH (ref 70–99)
Potassium: 3.3 mEq/L — ABNORMAL LOW (ref 3.5–5.1)
Sodium: 141 mEq/L (ref 135–145)
Total Bilirubin: 1 mg/dL (ref 0.2–1.2)
Total Protein: 8.7 g/dL — ABNORMAL HIGH (ref 6.0–8.3)

## 2023-03-02 NOTE — Progress Notes (Signed)
Chief Complaint: Follow-up elevated liver enzymes  HPI:    Troy Bishop is a 32 year old male with a past medical history as listed below, known to Dr. Myrtie Neither, who presents to clinic today to discuss elevated liver enzymes.    12/12/2022 patient seen in the ER for abnormal labs.  At that time discussed that he had been sent over from the urgent care for elevated liver enzymes.  At that time reported drinking a bottle of whiskey every 5 days.  He was given a Librium taper at the urgent care to help stop drinking.  CBC with a decreased white count of 3.0 and platelets 123.  CMP with a glucose of 120, AST 325, ALT 320 (AST 928, ALT 439 on 12/08/2022) ammonia 58.  Ethanol 356.  PT/INR normal.    Today, patient presents to clinic accompanied by his wife and small baby.  He declines an interpreter but it does feel like there is some disconnect/language barrier.  He describes that he has continued drinking since being seen in the urgent care in May and when he drinks he is tired excetra.  Apparently he is the only person who works in his home and has really been unable to deal with his drinking due to this.  He tells me he has tried to stop on his own multiple times but just cannot.  When he was seen in the urgent care/ER they gave him Ativan for withdrawals and he took this medicine for a while and it seemed to help him, but again he was unable to quit.    Denies fever, chills or weight loss.  Past Medical History:  Diagnosis Date   Painful orthopaedic hardware (HCC) 07/2017   right tibia    Past Surgical History:  Procedure Laterality Date   BREATH TEK H PYLORI N/A 09/08/2016   Procedure: BREATH TEK Richardo Priest;  Surgeon: Sherrilyn Rist, MD;  Location: [facility];  Service: Gastroenterology;  Laterality: N/A;   HARDWARE REMOVAL Left 07/27/2017   Procedure: Removal of deep implants right proximal and distal tibia;  Surgeon: Toni Arthurs, MD;  Location: [facility];  Service: Orthopedics;   Laterality: Left;   TENDON REPAIR Left 08/14/2014   Procedure: LEFT HAND WOUND EXPLORATION AND TENDON REPAIR;  Surgeon: Sharma Covert, MD;  Location: [facility];  Service: Orthopedics;  Laterality: Left;   TIBIA IM NAIL INSERTION  07/31/2012   Procedure: INTRAMEDULLARY (IM) NAIL TIBIAL;  Surgeon: Toni Arthurs, MD;  Location: [facility];  Service: Orthopedics;  Laterality: Left;   UPPER GI ENDOSCOPY  07/25/2016    Current Outpatient Medications  Medication Sig Dispense Refill   chlordiazePOXIDE (LIBRIUM) 25 MG capsule 50mg  PO TID x 1D, then 25-50mg  PO BID X 1D, then 25-50mg  PO QD X 1D (Patient not taking: Reported on 03/02/2023) 10 capsule 0   ketoconazole (NIZORAL) 2 % cream Apply 1 application topically 2 (two) times daily as needed for irritation. Apply to private areas only (Patient not taking: Reported on 03/02/2023) 60 g 0   Multiple Vitamins-Minerals (MULTIVITAMIN) tablet Take 1 tablet by mouth daily. (Patient not taking: Reported on 03/02/2023) 30 tablet 0   mupirocin ointment (BACTROBAN) 2 % Apply 1 application topically 2 (two) times daily. (Patient not taking: Reported on 07/16/2020) 22 g 0   ondansetron (ZOFRAN-ODT) 4 MG disintegrating tablet Take 1 tablet (4 mg total) by mouth every 8 (eight) hours as needed for nausea or vomiting. (Patient not taking: Reported on 03/02/2023) 20  tablet 0   pantoprazole (PROTONIX) 20 MG tablet Take 1 tablet (20 mg total) by mouth daily. (Patient not taking: Reported on 03/02/2023) 30 tablet 0   phenazopyridine (PYRIDIUM) 200 MG tablet Take 1 tablet (200 mg total) by mouth 3 (three) times daily. (Patient not taking: Reported on 07/16/2020) 6 tablet 0   thiamine (VITAMIN B1) 100 MG tablet Take 1 tablet (100 mg total) by mouth daily. (Patient not taking: Reported on 03/02/2023) 30 tablet 0   No current facility-administered medications for this visit.    Allergies as of 03/02/2023   (No Known Allergies)    Family History  Problem Relation Age of  Onset   Esophageal cancer Neg Hx    Liver disease Neg Hx    Colon cancer Neg Hx     Social History   Socioeconomic History   Marital status: Married    Spouse name: Not on file   Number of children: 4   Years of education: Not on file   Highest education level: Not on file  Occupational History   Not on file  Tobacco Use   Smoking status: Former    Current packs/day: 0.00    Types: Cigarettes    Quit date: 05/13/2014    Years since quitting: 8.8   Smokeless tobacco: Never  Vaping Use   Vaping status: Never Used  Substance and Sexual Activity   Alcohol use: Yes    Comment: 4 times/week   Drug use: No   Sexual activity: Not on file  Other Topics Concern   Not on file  Social History Narrative   ** Merged History Encounter **       Social Determinants of Health   Financial Resource Strain: Not on file  Food Insecurity: Not on file  Transportation Needs: Not on file  Physical Activity: Not on file  Stress: Not on file  Social Connections: Not on file  Intimate Partner Violence: Not on file    Review of Systems:    Constitutional: No weight loss, fever or chills Skin: No rash  Cardiovascular: No chest pain Respiratory: No SOB Gastrointestinal: See HPI and otherwise negative Genitourinary: No dysuria Neurological: No headache, dizziness or syncope Musculoskeletal: No new muscle or joint pain Hematologic: No bleeding Psychiatric: No history of depression or anxiety   Physical Exam:  Vital signs: BP (!) 150/88   Pulse (!) 106   Ht 5\' 6"  (1.676 m)   Wt 134 lb (60.8 kg)   BMI 21.63 kg/m    Constitutional:   Pleasant Asian male appears to be in NAD, Well developed, Well nourished, alert and cooperative Head:  Normocephalic and atraumatic. Eyes:   PEERL, EOMI. No icterus. Conjunctiva pink. Ears:  Normal auditory acuity. Neck:  Supple Throat: Oral cavity and pharynx without inflammation, swelling or lesion.  Respiratory: Respirations even and unlabored.  Lungs clear to auscultation bilaterally.   No wheezes, crackles, or rhonchi.  Cardiovascular: Normal S1, S2. No MRG. Regular rate and rhythm. No peripheral edema, cyanosis or pallor.  Gastrointestinal:  Soft, nondistended, nontender. No rebound or guarding. Normal bowel sounds. No appreciable masses or hepatomegaly. Rectal:  Not performed.  Msk:  Symmetrical without gross deformities. Without edema, no deformity or joint abnormality.  Neurologic:  Alert and  oriented x4;  grossly normal neurologically.  Skin:   Dry and intact without significant lesions or rashes. Psychiatric:Demonstrates good judgement and reason without abnormal affect or behaviors.  RELEVANT LABS AND IMAGING: CBC    Component Value Date/Time  WBC 3.0 (L) 12/12/2022 1929   RBC 4.37 12/12/2022 1929   HGB 13.6 12/12/2022 1929   HCT 41.7 12/12/2022 1929   PLT 123 (L) 12/12/2022 1929   MCV 95.4 12/12/2022 1929   MCH 31.1 12/12/2022 1929   MCHC 32.6 12/12/2022 1929   RDW 14.6 12/12/2022 1929   LYMPHSABS 1.2 12/12/2022 1929   MONOABS 0.3 12/12/2022 1929   EOSABS 0.1 12/12/2022 1929   BASOSABS 0.1 12/12/2022 1929    CMP     Component Value Date/Time   NA 144 12/12/2022 1929   K 3.9 12/12/2022 1929   CL 108 12/12/2022 1929   CO2 25 12/12/2022 1929   GLUCOSE 120 (H) 12/12/2022 1929   BUN <5 (L) 12/12/2022 1929   CREATININE 0.68 12/12/2022 1929   CALCIUM 8.8 (L) 12/12/2022 1929   PROT 8.3 (H) 12/12/2022 1929   ALBUMIN 4.2 12/12/2022 1929   AST 325 (H) 12/12/2022 1929   ALT 320 (H) 12/12/2022 1929   ALKPHOS 91 12/12/2022 1929   BILITOT 0.7 12/12/2022 1929   GFRNONAA >60 12/12/2022 1929   GFRAA >60 09/02/2017 1228    Assessment: 1.  Alcohol abuse: Patient continues to drink at least a quart of a bottle of liquor a day, tells me he cannot stop on his own 2.  Elevated LFTs: Back in May LFTs were acutely elevated, likely due to above  Plan: 1.  Patient was told that he would need to stop drinking to improve  his health.  Provided him with a few treatment centers he can try. I recommended that he call and ask for help with alcohol addiction.   [facility] [phone]   [facility] [phone]   [facility] [phone]   2.  Ordered a right upper quadrant ultrasound to evaluate the liver as well as repeat LFTs.  3.  Patient to follow in clinic with [place] per recommendations after labs and imaging above.  Encouraged him to call one of the treatment centers.  Hyacinth Meeker, PA-C [facility] 03/02/2023, 1:31 PM

## 2023-03-02 NOTE — Patient Instructions (Signed)
You need to stop drinking to improve your health. Here are a few treatment centers you can try. I recommend you call and ask for help with alcohol addiction.    [facility] [phone]    [facility] [phone]    [facility] [phone]   Your provider has requested that you go to the basement level for lab work before leaving today. Press "B" on the elevator. The lab is located at the first door on the left as you exit the elevator.  You have been scheduled for an abdominal ultrasound at Emergency Department @ [facility] on Saturday 03/04/23 at 8:30 am. Please arrive 30 minutes prior to your appointment for registration. Make certain not to have anything to eat or drink 6 hours prior to your appointment. Should you need to reschedule your appointment, please contact radiology at [phone]. This test typically takes about 30 minutes to perform.  _______________________________________________________  If your blood pressure at your visit was 140/90 or greater, please contact your primary care physician to follow up on this.  _______________________________________________________  If you are age 25 or older, your body mass index should be between 23-30. Your Body mass index is 26.31 kg/m. If this is out of the aforementioned range listed, please consider follow up with your Primary Care Provider.  If you are age 55 or younger, your body mass index should be between 19-25. Your Body mass index is 26.31 kg/m. If this is out of the aformentioned range listed, please consider follow up with your Primary Care Provider.   ________________________________________________________  The  GI providers would like to encourage you to use [facility] to communicate with providers for non-urgent requests or questions.  Due to long hold times on the telephone, sending your provider a message by [facility] may be a faster and more efficient way to  get a response.  Please allow 48 business hours for a response.  Please remember that this is for non-urgent requests.  _______________________________________________________

## 2023-03-02 NOTE — Progress Notes (Signed)
____________________________________________________________  Attending physician addendum:  Thank you for sending this case to me. I have reviewed the entire note and agree with the plan.  I agree this man's elevated liver labs are related to his alcohol abuse (EtOH level 356 on ED visit in early May). Hopefully he will access to the addiction resources that you provided for him.  Amada Jupiter, MD  ____________________________________________________________

## 2023-03-04 ENCOUNTER — Ambulatory Visit (HOSPITAL_COMMUNITY): Admission: RE | Admit: 2023-03-04 | Payer: BLUE CROSS/BLUE SHIELD | Source: Ambulatory Visit

## 2023-03-25 ENCOUNTER — Ambulatory Visit (HOSPITAL_COMMUNITY): Admission: RE | Admit: 2023-03-25 | Payer: Medicaid Other | Source: Ambulatory Visit

## 2023-03-25 DIAGNOSIS — R7989 Other specified abnormal findings of blood chemistry: Secondary | ICD-10-CM | POA: Insufficient documentation

## 2023-03-28 ENCOUNTER — Telehealth: Payer: Self-pay | Admitting: Physician Assistant

## 2023-03-28 NOTE — Telephone Encounter (Signed)
Patient is returning your call.  

## 2023-03-28 NOTE — Telephone Encounter (Signed)
Spoke with patient. See 8/17 [place] result note for details

## 2023-05-02 ENCOUNTER — Ambulatory Visit (HOSPITAL_COMMUNITY)
Admission: EM | Admit: 2023-05-02 | Discharge: 2023-05-02 | Disposition: A | Discharge status: Home / Self Care | Payer: Medicaid Other

## 2023-05-02 ENCOUNTER — Encounter (HOSPITAL_COMMUNITY): Payer: Self-pay

## 2023-05-02 DIAGNOSIS — S46911A Strain of unspecified muscle, fascia and tendon at shoulder and upper arm level, right arm, initial encounter: Secondary | ICD-10-CM

## 2023-05-02 MED ORDER — ACETAMINOPHEN 500 MG PO TABS: 500.0000 mg | ORAL_TABLET | Freq: Four times a day (QID) | ORAL | 0 refills | Status: DC | PRN

## 2023-05-02 MED ORDER — NAPROXEN 500 MG PO TABS: 500.0000 mg | ORAL_TABLET | Freq: Two times a day (BID) | ORAL | 0 refills | Status: DC | PRN

## 2023-05-02 NOTE — Discharge Instructions (Signed)
You can alternate between Naproxen and Tylenol every 4-6 hours as needed for shoulder pain. Do not take Naproxen with other NSAIDs including, Ibuprofen/Mortin/Advil, Aleve, Goodys powder. You can alternate between heat and ice as needed for pain as well. If symptoms persist you can return here as needed for follow-up with [facility]. Someone should call you this week to set you up with a primary care doctor for further management of blood pressure.

## 2023-05-02 NOTE — ED Triage Notes (Signed)
Patient here today with c/o right posterior shoulder pain X 1 week. KNI. Patient states that the pain started upon waking one morning. He tried using Pennsaid with no relief.
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
## 2023-05-09 ENCOUNTER — Encounter (HOSPITAL_COMMUNITY): Payer: Self-pay

## 2023-06-07 ENCOUNTER — Ambulatory Visit (HOSPITAL_COMMUNITY)
Admission: EM | Admit: 2023-06-07 | Discharge: 2023-06-07 | Disposition: A | Discharge status: Home / Self Care | Payer: Medicaid Other | Attending: Emergency Medicine | Admitting: Emergency Medicine

## 2023-06-07 ENCOUNTER — Encounter (HOSPITAL_COMMUNITY): Payer: Self-pay | Admitting: Emergency Medicine

## 2023-06-07 DIAGNOSIS — R55 Syncope and collapse: Secondary | ICD-10-CM | POA: Diagnosis not present

## 2023-06-07 DIAGNOSIS — Z87891 Personal history of nicotine dependence: Secondary | ICD-10-CM | POA: Diagnosis not present

## 2023-06-07 DIAGNOSIS — R569 Unspecified convulsions: Secondary | ICD-10-CM | POA: Diagnosis not present

## 2023-06-07 DIAGNOSIS — R9431 Abnormal electrocardiogram [ECG] [EKG]: Secondary | ICD-10-CM | POA: Diagnosis not present

## 2023-06-07 LAB — CBC WITH DIFFERENTIAL/PLATELET
Abs Immature Granulocytes: 0 10*3/uL (ref 0.00–0.07)
Basophils Absolute: 0 10*3/uL (ref 0.0–0.1)
Basophils Relative: 1 %
Eosinophils Absolute: 0.1 10*3/uL (ref 0.0–0.5)
Eosinophils Relative: 2 %
HCT: 44.9 % (ref 39.0–52.0)
Hemoglobin: 14.5 g/dL (ref 13.0–17.0)
Lymphocytes Relative: 18 %
Lymphs Abs: 0.8 10*3/uL (ref 0.7–4.0)
MCH: 31.4 pg (ref 26.0–34.0)
MCHC: 32.3 g/dL (ref 30.0–36.0)
MCV: 97.2 fL (ref 80.0–100.0)
Monocytes Absolute: 0.6 10*3/uL (ref 0.1–1.0)
Monocytes Relative: 13 %
Neutro Abs: 3 10*3/uL (ref 1.7–7.7)
Neutrophils Relative %: 66 %
Platelets: 102 10*3/uL — ABNORMAL LOW (ref 150–400)
RBC: 4.62 MIL/uL (ref 4.22–5.81)
RDW: 13.5 % (ref 11.5–15.5)
WBC: 4.5 10*3/uL (ref 4.0–10.5)
nRBC: 0 % (ref 0.0–0.2)

## 2023-06-07 LAB — COMPREHENSIVE METABOLIC PANEL
ALT: 305 U/L — ABNORMAL HIGH (ref 0–44)
AST: 232 U/L — ABNORMAL HIGH (ref 15–41)
Albumin: 4.1 g/dL (ref 3.5–5.0)
Alkaline Phosphatase: 82 U/L (ref 38–126)
Anion gap: 12 (ref 5–15)
BUN: 6 mg/dL (ref 6–20)
CO2: 24 mmol/L (ref 22–32)
Calcium: 9.4 mg/dL (ref 8.9–10.3)
Chloride: 102 mmol/L (ref 98–111)
Creatinine, Ser: 0.74 mg/dL (ref 0.61–1.24)
GFR, Estimated: >60 mL/min (ref >60)
Glucose, Bld: 92 mg/dL (ref 70–99)
Potassium: 4.2 mmol/L (ref 3.5–5.1)
Sodium: 138 mmol/L (ref 135–145)
Total Bilirubin: 0.9 mg/dL (ref 0.3–1.2)
Total Protein: 8.2 g/dL — ABNORMAL HIGH (ref 6.5–8.1)

## 2023-06-07 LAB — MAGNESIUM: Magnesium: 2.2 mg/dL (ref 1.7–2.4)

## 2023-06-07 LAB — POCT FASTING CBG KUC MANUAL ENTRY: POCT Glucose (KUC): 98 mg/dL (ref 70–99)

## 2023-06-07 LAB — ETHANOL: Alcohol, Ethyl (B): <10 mg/dL (ref <10)

## 2023-06-07 LAB — LIPASE, BLOOD: Lipase: 35 U/L (ref 11–51)

## 2023-06-07 NOTE — ED Triage Notes (Addendum)
Pt present today after having loss of consciousness after using the bathroom this morning. States he had heart burn sensation before passing out. He did not fall and was on his bed at the time.   Pt states he thinks it may be due to high BP. About a year ago his BP was checked at work and was elevated. He was told to go to PCP but never did.
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
## 2023-06-07 NOTE — Discharge Instructions (Signed)
It is unclear what caused her syncopal episode.  You may have had a seizure.  If you develop loss of consciousness or seizure-like activity again, immediately call 911.  Your blood pressure was elevated today in clinic.  We have checked some labs here in clinic and we will contact you if there is anything emergent to follow-up on.  Do not hesitate to go to the nearest emergency department if you develop any new or concerning symptoms.

## 2023-06-13 ENCOUNTER — Encounter: Payer: Self-pay | Admitting: Gastroenterology

## 2023-06-13 ENCOUNTER — Ambulatory Visit (INDEPENDENT_AMBULATORY_CARE_PROVIDER_SITE_OTHER): Payer: Medicaid Other | Admitting: Gastroenterology

## 2023-06-13 VITALS — Ht 66.0 in | Wt 144.4 lb

## 2023-06-13 DIAGNOSIS — K7030 Alcoholic cirrhosis of liver without ascites: Secondary | ICD-10-CM | POA: Diagnosis not present

## 2023-06-13 DIAGNOSIS — R1013 Epigastric pain: Secondary | ICD-10-CM

## 2023-06-13 DIAGNOSIS — R12 Heartburn: Secondary | ICD-10-CM

## 2023-06-13 MED ORDER — FAMOTIDINE 40 MG PO TABS: 40.0000 mg | ORAL_TABLET | Freq: Every day | ORAL | 1 refills | Status: DC

## 2023-06-13 NOTE — Progress Notes (Signed)
[facility] Consult Note:  History: Troy Bishop 06/13/2023  Referring provider: Pcp, No  Reason for consult/chief complaint: Cirrhosis (Patient complains of occasional heartburn. Hiccups; 3 month follow up)   Subjective  Prior history: 32 year old man known to me from endoscopic evaluation of dyspepsia in 2017 and follow-up visit in 2018.  Mild gastritis on 2017 EGD, biopsies positive for H. pylori.  Follow-up breath test in February 2018 confirmed eradication.   His clinical history is also outlined in the detailed APP office note of 03/02/2023.  At the most recent visit, he was referred back to see [place] for elevated LFTs that were due to his ongoing heavy alcohol use, and he is known to have alcohol-related cirrhosis.  At his most recent visit, he was also given contact info for several agencies to seek alcohol abuse counseling.  Since the most recent visit, he came to the [facility] care on 06/07/2023 for syncope and seizure-like activity, ongoing alcohol use reported (see labs below) No ischemic EKG changes.  He was monitored for nearly 12 hours in the urgent care facility with no repeat seizure activity.  Patient decided against transfer to ED for head imaging. Discussed the use of AI scribe software for clinical note transcription with the patient, who gave verbal consent to proceed.  Troy Bishop complains of dyspeptic symptoms with what sounds like intermittent heartburn or epigastric burning discomfort after meals.  It was somewhat difficult to characterize.  Denies dysphagia or odynophagia.  He relayed the events of the symptoms bringing him to the urgent care recently, and says no similar symptoms have occurred since then.  He also feels like he has quit alcohol since he stopped using liquor, but it is not clear he recognizes that drinking beer also constitutes alcohol consumption.  He is having 3-4 beers per day most of the time, he seems to indicate that when he  stopped it it made sleeping more difficult.   ROS:  Review of Systems He denies chest pain or dyspnea Remainder of systems negative except as above  Past Medical History: Past Medical History:  Diagnosis Date   Liver disease    Painful orthopaedic hardware (HCC) 07/2017   right tibia     Past Surgical History: Past Surgical History:  Procedure Laterality Date   BREATH TEK H PYLORI N/A 09/08/2016   Procedure: BREATH TEK Richardo Priest;  Surgeon: Sherrilyn Rist, MD;  Location: [facility];  Service: Gastroenterology;  Laterality: N/A;   HARDWARE REMOVAL Left 07/27/2017   Procedure: Removal of deep implants right proximal and distal tibia;  Surgeon: Toni Arthurs, MD;  Location: [facility];  Service: Orthopedics;  Laterality: Left;   TENDON REPAIR Left 08/14/2014   Procedure: LEFT HAND WOUND EXPLORATION AND TENDON REPAIR;  Surgeon: Sharma Covert, MD;  Location: [facility];  Service: Orthopedics;  Laterality: Left;   TIBIA IM NAIL INSERTION  07/31/2012   Procedure: INTRAMEDULLARY (IM) NAIL TIBIAL;  Surgeon: Toni Arthurs, MD;  Location[facility];  Service: Orthopedics;  Laterality: Left;   UPPER GI ENDOSCOPY  07/25/2016     Family History: Family History  Problem Relation Age of Onset   Esophageal cancer Neg Hx    Liver disease Neg Hx    Colon cancer Neg Hx     Social History: Social History   Socioeconomic History   Marital status: Married    Spouse name: Not on file   Number of children: 4   Years of education:  Not on file   Highest education level: Not on file  Occupational History   Not on file  Tobacco Use   Smoking status: Former    Current packs/day: 0.00    Types: Cigarettes    Quit date: 05/13/2014    Years since quitting: 9.0   Smokeless tobacco: Never  Vaping Use   Vaping status: Never Used  Substance and Sexual Activity   Alcohol use: Yes    Comment: 4 times/week   Drug use: No   Sexual activity: Not on file  Other Topics  Concern   Not on file  Social History Narrative   ** Merged History Encounter **       Social Determinants of Health   Financial Resource Strain: Not on file  Food Insecurity: Not on file  Transportation Needs: Not on file  Physical Activity: Not on file  Stress: Not on file  Social Connections: Not on file   Originally from [place].  Married with 4 children ranging for 6 months to age 72 Allergies: No Known Allergies  Outpatient Meds: Current Outpatient Medications  Medication Sig Dispense Refill   famotidine (PEPCID) 40 MG tablet Take 1 tablet (40 mg total) by mouth daily. 90 tablet 1   risankizumab-rzaa (SKYRIZI PEN) 150 MG/ML pen Inject 150 mg as directed.     No current facility-administered medications for this visit.      ___________________________________________________________________ Objective   Exam:  Ht 5\' 6"  (1.676 m)   Wt 144 lb 6.4 oz (65.5 kg)   BMI 23.31 kg/m  Wt Readings from Last 3 Encounters:  06/13/23 144 lb 6.4 oz (65.5 kg)  03/02/23 134 lb (60.8 kg)  12/12/22 163 lb (73.9 kg)    General: Not acutely ill-appearing Eyes: sclera anicteric, no redness ENT: oral mucosa moist without lesions, no cervical or supraclavicular lymphadenopathy.  No muscle wasting CV: Regular without appreciable murmur, no JVD, no peripheral edema Resp: clear to auscultation bilaterally, normal RR and effort noted GI: soft, no tenderness, with active bowel sounds. No guarding or palpable organomegaly noted. Skin; warm and dry, no rash or jaundice noted.  Multiple tattoos Neuro: awake, alert and oriented x 3. Normal gross motor function and fluent speech   Labs:     Latest Ref Rng & Units 06/07/2023    5:18 PM 12/12/2022    7:29 PM 12/08/2022   10:07 AM  CBC  WBC 4.0 - 10.5 K/uL 4.5  3.0  2.4   Hemoglobin 13.0 - 17.0 g/dL 40.9  81.1  91.4   Hematocrit 39.0 - 52.0 % 44.9  41.7  42.0   Platelets 150 - 400 K/uL 102  123  49       Latest Ref Rng & Units  06/07/2023    5:18 PM 03/02/2023    2:02 PM 12/12/2022    7:29 PM  CMP  Glucose 70 - 99 mg/dL 92  782  956   BUN 6 - 20 mg/dL 6  5  <5   Creatinine 2.13 - 1.24 mg/dL 0.86  5.78  4.69   Sodium 135 - 145 mmol/L 138  141  144   Potassium 3.5 - 5.1 mmol/L 4.2  3.3  3.9   Chloride 98 - 111 mmol/L 102  103  108   CO2 22 - 32 mmol/L 24  26  25    Calcium 8.9 - 10.3 mg/dL 9.4  9.6  8.8   Total Protein 6.5 - 8.1 g/dL 8.2  8.7  8.3  Total Bilirubin 0.3 - 1.2 mg/dL 0.9  1.0  0.7   Alkaline Phos 38 - 126 U/L 82  93  91   AST 15 - 41 U/L 232  302  325   ALT 0 - 44 U/L 305  200  320      Radiologic Studies: CLINICAL DATA:  cirrhosis, elevated LFTs   EXAM: ULTRASOUND ABDOMEN LIMITED RIGHT UPPER QUADRANT   COMPARISON:  August 02, 2012   FINDINGS: Gallbladder:   No gallstones or wall thickening visualized. No sonographic Murphy sign noted by sonographer.   Common bile duct:   Diameter: Visualized portion measures 3 mm, within normal limits.   Liver:   No focal lesion identified. Increased in parenchymal echogenicity with poor acoustic penetrance. Mildly lobular contours. Portal vein is grossly patent on color Doppler imaging with normal direction of blood flow towards the liver.   Other: None.   IMPRESSION: Hepatic steatosis. Mildly lobular contours may reflect underlying cirrhosis.     Electronically Signed   By: Meda Klinefelter M.D.   On: 03/25/2023 10:20   Assessment: Encounter Diagnoses  Name Primary?   Alcoholic cirrhosis of liver without ascites (HCC) Yes   Heartburn    Dyspepsia     He has alcohol related cirrhosis that is presently compensated, but clearly at risk of progression in the future with ongoing alcohol use that causes significant transaminitis.  He does not seem to fully recognize the gravity of the situation.  He needs a primary care provider for some attention to general health maintenance and also perhaps to help with his sleep disturbance.  It  is not clear if that is leading to or the result of his alcohol use.  However, if he was able to get some better sleep perhaps he would decrease or even discontinue alcohol consumption.  He reports having contacted the family medicine center at [facility] but was told the next appointment was in March.  I strongly recommended he call them back and make the next available appointment and then make every effort to keep it, even if it is months from now.  I am not presently planning an upper endoscopy to screen again for varices because he recently had some seizure-like activity.  It sounds as if it was most likely alcohol related.  He is up-to-date on [facility] screening with the recent ultrasound.  I have prescribed him some Pepcid 40 mg once daily for his dyspepsia and we will plan to see him back in 6 months or sooner if needed.    32 minutes were spent on this encounter (including chart review, history/exam, counseling/coordination of care, and documentation) > 50% of that time was spent on counseling and coordination of care.   Charlie Pitter III  CC: Referring provider noted above

## 2023-06-13 NOTE — Patient Instructions (Signed)
_______________________________________________________  If your blood pressure at your visit was 140/90 or greater, please contact your primary care physician to follow up on this.  _______________________________________________________  If you are age 33 or older, your body mass index should be between 23-30. Your Body mass index is 23.31 kg/m. If this is out of the aforementioned range listed, please consider follow up with your Primary Care Provider.  If you are age 51 or younger, your body mass index should be between 19-25. Your Body mass index is 23.31 kg/m. If this is out of the aformentioned range listed, please consider follow up with your Primary Care Provider.   ________________________________________________________  The [facility] providers would like to encourage you to use [facility] to communicate with providers for non-urgent requests or questions.  Due to long hold times on the telephone, sending your provider a message by [facility] may be a faster and more efficient way to get a response.  Please allow 48 business hours for a response.  Please remember that this is for non-urgent requests.  _______________________________________________________ It was a pleasure to see you today!  Thank you for trusting me with your gastrointestinal care!

## 2023-10-27 ENCOUNTER — Ambulatory Visit: Payer: Medicaid Other | Admitting: Internal Medicine

## 2023-11-12 ENCOUNTER — Encounter (HOSPITAL_COMMUNITY): Payer: Self-pay | Admitting: Emergency Medicine

## 2023-11-12 ENCOUNTER — Ambulatory Visit (HOSPITAL_COMMUNITY)
Admission: EM | Admit: 2023-11-12 | Discharge: 2023-11-12 | Disposition: A | Discharge status: Home / Self Care | Attending: Physician Assistant | Admitting: Physician Assistant

## 2023-11-12 ENCOUNTER — Other Ambulatory Visit: Payer: Self-pay

## 2023-11-12 DIAGNOSIS — S61340A Puncture wound with foreign body of right index finger with damage to nail, initial encounter: Secondary | ICD-10-CM

## 2023-11-12 DIAGNOSIS — W540XXA Bitten by dog, initial encounter: Secondary | ICD-10-CM | POA: Diagnosis not present

## 2023-11-12 DIAGNOSIS — Z203 Contact with and (suspected) exposure to rabies: Secondary | ICD-10-CM | POA: Diagnosis not present

## 2023-11-12 DIAGNOSIS — S61230A Puncture wound without foreign body of right index finger without damage to nail, initial encounter: Secondary | ICD-10-CM | POA: Diagnosis not present

## 2023-11-12 MED ORDER — AMOXICILLIN-POT CLAVULANATE 875-125 MG PO TABS: 1.0000 | ORAL_TABLET | Freq: Two times a day (BID) | ORAL | 0 refills | Status: DC

## 2023-11-12 MED ORDER — RABIES VACCINE, PCEC IM SUSR
INTRAMUSCULAR | Status: AC
  Filled 2023-11-12: qty 1

## 2023-11-12 MED ORDER — RABIES IMMUNE GLOBULIN 150 UNIT/ML IM INJ
INJECTION | INTRAMUSCULAR | Status: AC
  Filled 2023-11-12: qty 10

## 2023-11-12 MED ORDER — RABIES IMMUNE GLOBULIN 150 UNIT/ML IM INJ
20.0000 [IU]/kg | INJECTION | Freq: Once | INTRAMUSCULAR | Status: AC
  Administered 2023-11-12: 1350 [IU] via INTRAMUSCULAR

## 2023-11-12 MED ORDER — RABIES VACCINE, PCEC IM SUSR
1.0000 mL | Freq: Once | INTRAMUSCULAR | Status: AC
  Administered 2023-11-12: 1 mL via INTRAMUSCULAR
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
## 2023-11-12 NOTE — Discharge Instructions (Addendum)
 Meds ordered this encounter  Medications   amoxicillin-clavulanate (AUGMENTIN) 875-125 MG tablet    Sig: Take 1 tablet by mouth every 12 (twelve) hours.    Dispense:  10 tablet    Refill:  0    Supervising Provider:   Katha Cabal [[id number]]   rabies immune globulin (HYPERRAB/KEDRAB) injection 1,350 Units   rabies vaccine (RABAVERT) injection 1 mL    In addition to the human rabies immune globulin (HRIG), you were given the first dose of the rabies vaccine today (Day 0).  Please return here on the following dates to complete the rabies series: Day 3: 11/15/2023 Day 7: 11/19/2023 Day 14: 11/26/2023  We treated you for dog bite today.  You were given rabies immunoglobulin and rabies vaccine.  You will need to return on the dates listed above for the rest of the rabies vaccine series.  Start Augmentin twice daily for 5 days.  If anything changes or worsens you need to be seen immediately.

## 2023-11-12 NOTE — ED Triage Notes (Signed)
 Dog bite occurred around 2:30 am.  Patient does not know dog.  Reports there is a bite to right index finger, points to nail on finger.  No obvious injury noted.

## 2023-12-16 ENCOUNTER — Inpatient Hospital Stay (HOSPITAL_COMMUNITY)

## 2023-12-16 ENCOUNTER — Emergency Department (HOSPITAL_COMMUNITY)

## 2023-12-16 ENCOUNTER — Inpatient Hospital Stay (HOSPITAL_COMMUNITY)
Admission: EM | Admit: 2023-12-16 | Discharge: 2023-12-30 | DRG: 064 | Disposition: A | Discharge status: Rehabilitation Facility | Attending: Internal Medicine | Admitting: Internal Medicine

## 2023-12-16 ENCOUNTER — Other Ambulatory Visit: Payer: Self-pay

## 2023-12-16 DIAGNOSIS — R1310 Dysphagia, unspecified: Secondary | ICD-10-CM | POA: Diagnosis present

## 2023-12-16 DIAGNOSIS — F10120 Alcohol abuse with intoxication, uncomplicated: Secondary | ICD-10-CM | POA: Diagnosis present

## 2023-12-16 DIAGNOSIS — R609 Edema, unspecified: Secondary | ICD-10-CM | POA: Diagnosis not present

## 2023-12-16 DIAGNOSIS — I639 Cerebral infarction, unspecified: Secondary | ICD-10-CM | POA: Diagnosis not present

## 2023-12-16 DIAGNOSIS — K7010 Alcoholic hepatitis without ascites: Secondary | ICD-10-CM | POA: Diagnosis present

## 2023-12-16 DIAGNOSIS — D696 Thrombocytopenia, unspecified: Secondary | ICD-10-CM | POA: Diagnosis not present

## 2023-12-16 DIAGNOSIS — G936 Cerebral edema: Secondary | ICD-10-CM | POA: Diagnosis present

## 2023-12-16 DIAGNOSIS — F10139 Alcohol abuse with withdrawal, unspecified: Secondary | ICD-10-CM | POA: Diagnosis present

## 2023-12-16 DIAGNOSIS — F05 Delirium due to known physiological condition: Secondary | ICD-10-CM | POA: Diagnosis present

## 2023-12-16 DIAGNOSIS — F10239 Alcohol dependence with withdrawal, unspecified: Secondary | ICD-10-CM

## 2023-12-16 DIAGNOSIS — D649 Anemia, unspecified: Secondary | ICD-10-CM | POA: Diagnosis present

## 2023-12-16 DIAGNOSIS — R29709 NIHSS score 9: Secondary | ICD-10-CM

## 2023-12-16 DIAGNOSIS — I161 Hypertensive emergency: Secondary | ICD-10-CM | POA: Diagnosis present

## 2023-12-16 DIAGNOSIS — I610 Nontraumatic intracerebral hemorrhage in hemisphere, subcortical: Principal | ICD-10-CM | POA: Diagnosis present

## 2023-12-16 DIAGNOSIS — R7989 Other specified abnormal findings of blood chemistry: Secondary | ICD-10-CM | POA: Diagnosis not present

## 2023-12-16 DIAGNOSIS — G9341 Metabolic encephalopathy: Secondary | ICD-10-CM | POA: Diagnosis present

## 2023-12-16 DIAGNOSIS — E871 Hypo-osmolality and hyponatremia: Secondary | ICD-10-CM | POA: Diagnosis present

## 2023-12-16 DIAGNOSIS — E785 Hyperlipidemia, unspecified: Secondary | ICD-10-CM | POA: Diagnosis not present

## 2023-12-16 DIAGNOSIS — R509 Fever, unspecified: Secondary | ICD-10-CM | POA: Diagnosis not present

## 2023-12-16 DIAGNOSIS — R252 Cramp and spasm: Secondary | ICD-10-CM | POA: Diagnosis not present

## 2023-12-16 DIAGNOSIS — G8194 Hemiplegia, unspecified affecting left nondominant side: Secondary | ICD-10-CM | POA: Diagnosis present

## 2023-12-16 DIAGNOSIS — R414 Neurologic neglect syndrome: Secondary | ICD-10-CM | POA: Diagnosis present

## 2023-12-16 DIAGNOSIS — D62 Acute posthemorrhagic anemia: Secondary | ICD-10-CM | POA: Diagnosis not present

## 2023-12-16 DIAGNOSIS — Z781 Physical restraint status: Secondary | ICD-10-CM

## 2023-12-16 DIAGNOSIS — R569 Unspecified convulsions: Secondary | ICD-10-CM | POA: Diagnosis not present

## 2023-12-16 DIAGNOSIS — K7030 Alcoholic cirrhosis of liver without ascites: Secondary | ICD-10-CM | POA: Diagnosis present

## 2023-12-16 DIAGNOSIS — Z6822 Body mass index (BMI) 22.0-22.9, adult: Secondary | ICD-10-CM

## 2023-12-16 DIAGNOSIS — Y908 Blood alcohol level of 240 mg/100 ml or more: Secondary | ICD-10-CM | POA: Diagnosis present

## 2023-12-16 DIAGNOSIS — K766 Portal hypertension: Secondary | ICD-10-CM | POA: Diagnosis present

## 2023-12-16 DIAGNOSIS — R739 Hyperglycemia, unspecified: Secondary | ICD-10-CM

## 2023-12-16 DIAGNOSIS — F411 Generalized anxiety disorder: Secondary | ICD-10-CM | POA: Diagnosis not present

## 2023-12-16 DIAGNOSIS — E876 Hypokalemia: Secondary | ICD-10-CM | POA: Diagnosis present

## 2023-12-16 DIAGNOSIS — I612 Nontraumatic intracerebral hemorrhage in hemisphere, unspecified: Secondary | ICD-10-CM | POA: Diagnosis not present

## 2023-12-16 DIAGNOSIS — I611 Nontraumatic intracerebral hemorrhage in hemisphere, cortical: Secondary | ICD-10-CM | POA: Diagnosis present

## 2023-12-16 DIAGNOSIS — I619 Nontraumatic intracerebral hemorrhage, unspecified: Secondary | ICD-10-CM | POA: Diagnosis not present

## 2023-12-16 DIAGNOSIS — E440 Moderate protein-calorie malnutrition: Secondary | ICD-10-CM | POA: Diagnosis not present

## 2023-12-16 DIAGNOSIS — G935 Compression of brain: Secondary | ICD-10-CM | POA: Diagnosis present

## 2023-12-16 DIAGNOSIS — F17210 Nicotine dependence, cigarettes, uncomplicated: Secondary | ICD-10-CM | POA: Diagnosis present

## 2023-12-16 DIAGNOSIS — D6959 Other secondary thrombocytopenia: Secondary | ICD-10-CM | POA: Diagnosis present

## 2023-12-16 DIAGNOSIS — K5900 Constipation, unspecified: Secondary | ICD-10-CM | POA: Diagnosis not present

## 2023-12-16 DIAGNOSIS — G9340 Encephalopathy, unspecified: Secondary | ICD-10-CM | POA: Diagnosis not present

## 2023-12-16 DIAGNOSIS — R471 Dysarthria and anarthria: Secondary | ICD-10-CM | POA: Diagnosis present

## 2023-12-16 DIAGNOSIS — G4700 Insomnia, unspecified: Secondary | ICD-10-CM | POA: Diagnosis not present

## 2023-12-16 DIAGNOSIS — I629 Nontraumatic intracranial hemorrhage, unspecified: Principal | ICD-10-CM

## 2023-12-16 DIAGNOSIS — M25552 Pain in left hip: Secondary | ICD-10-CM | POA: Diagnosis present

## 2023-12-16 DIAGNOSIS — I10 Essential (primary) hypertension: Secondary | ICD-10-CM | POA: Diagnosis present

## 2023-12-16 DIAGNOSIS — I69391 Dysphagia following cerebral infarction: Secondary | ICD-10-CM | POA: Diagnosis not present

## 2023-12-16 DIAGNOSIS — F1010 Alcohol abuse, uncomplicated: Secondary | ICD-10-CM | POA: Diagnosis not present

## 2023-12-16 DIAGNOSIS — K5901 Slow transit constipation: Secondary | ICD-10-CM | POA: Diagnosis not present

## 2023-12-16 DIAGNOSIS — R29718 NIHSS score 18: Secondary | ICD-10-CM | POA: Diagnosis present

## 2023-12-16 DIAGNOSIS — R7401 Elevation of levels of liver transaminase levels: Secondary | ICD-10-CM | POA: Diagnosis not present

## 2023-12-16 DIAGNOSIS — F10920 Alcohol use, unspecified with intoxication, uncomplicated: Secondary | ICD-10-CM

## 2023-12-16 DIAGNOSIS — D72819 Decreased white blood cell count, unspecified: Secondary | ICD-10-CM | POA: Diagnosis present

## 2023-12-16 DIAGNOSIS — M79602 Pain in left arm: Secondary | ICD-10-CM | POA: Diagnosis not present

## 2023-12-16 LAB — CBC
HCT: 45.8 % (ref 39.0–52.0)
Hemoglobin: 15.1 g/dL (ref 13.0–17.0)
MCH: 31.4 pg (ref 26.0–34.0)
MCHC: 33 g/dL (ref 30.0–36.0)
MCV: 95.2 fL (ref 80.0–100.0)
Platelets: 56 10*3/uL — ABNORMAL LOW (ref 150–400)
RBC: 4.81 MIL/uL (ref 4.22–5.81)
RDW: 14.9 % (ref 11.5–15.5)
WBC: 3.5 10*3/uL — ABNORMAL LOW (ref 4.0–10.5)
nRBC: 0 % (ref 0.0–0.2)

## 2023-12-16 LAB — COMPREHENSIVE METABOLIC PANEL WITH GFR
ALT: 221 U/L — ABNORMAL HIGH (ref 0–44)
AST: 272 U/L — ABNORMAL HIGH (ref 15–41)
Albumin: 3.9 g/dL (ref 3.5–5.0)
Alkaline Phosphatase: 85 U/L (ref 38–126)
Anion gap: 15 (ref 5–15)
BUN: 6 mg/dL (ref 6–20)
CO2: 23 mmol/L (ref 22–32)
Calcium: 9.2 mg/dL (ref 8.9–10.3)
Chloride: 104 mmol/L (ref 98–111)
Creatinine, Ser: 0.72 mg/dL (ref 0.61–1.24)
GFR, Estimated: >60 mL/min (ref >60)
Glucose, Bld: 111 mg/dL — ABNORMAL HIGH (ref 70–99)
Potassium: 3.4 mmol/L — ABNORMAL LOW (ref 3.5–5.1)
Sodium: 142 mmol/L (ref 135–145)
Total Bilirubin: 0.9 mg/dL (ref 0.0–1.2)
Total Protein: 8.4 g/dL — ABNORMAL HIGH (ref 6.5–8.1)

## 2023-12-16 LAB — SODIUM
Sodium: 141 mmol/L (ref 135–145)
Sodium: 142 mmol/L (ref 135–145)
Sodium: 141 mmol/L (ref 135–145)
Sodium: 135 mmol/L (ref 135–145)

## 2023-12-16 LAB — CBG MONITORING, ED: Glucose-Capillary: 103 mg/dL — ABNORMAL HIGH (ref 70–99)

## 2023-12-16 LAB — DIFFERENTIAL
Abs Immature Granulocytes: 0.01 10*3/uL (ref 0.00–0.07)
Basophils Absolute: 0 10*3/uL (ref 0.0–0.1)
Basophils Relative: 1 %
Eosinophils Absolute: 0.1 10*3/uL (ref 0.0–0.5)
Eosinophils Relative: 2 %
Immature Granulocytes: 0 %
Lymphocytes Relative: 27 %
Lymphs Abs: 0.9 10*3/uL (ref 0.7–4.0)
Monocytes Absolute: 0.6 10*3/uL (ref 0.1–1.0)
Monocytes Relative: 16 %
Neutro Abs: 1.9 10*3/uL (ref 1.7–7.7)
Neutrophils Relative %: 54 %

## 2023-12-16 LAB — I-STAT CHEM 8, ED
BUN: 4 mg/dL — ABNORMAL LOW (ref 6–20)
Calcium, Ion: 1.04 mmol/L — ABNORMAL LOW (ref 1.15–1.40)
Chloride: 103 mmol/L (ref 98–111)
Creatinine, Ser: 1 mg/dL (ref 0.61–1.24)
Glucose, Bld: 106 mg/dL — ABNORMAL HIGH (ref 70–99)
HCT: 48 % (ref 39.0–52.0)
Hemoglobin: 16.3 g/dL (ref 13.0–17.0)
Potassium: 3.3 mmol/L — ABNORMAL LOW (ref 3.5–5.1)
Sodium: 142 mmol/L (ref 135–145)
TCO2: 25 mmol/L (ref 22–32)

## 2023-12-16 LAB — RAPID URINE DRUG SCREEN, HOSP PERFORMED
Amphetamines: NOT DETECTED
Barbiturates: POSITIVE — AB
Benzodiazepines: NOT DETECTED
Cocaine: NOT DETECTED
Opiates: NOT DETECTED
Tetrahydrocannabinol: NOT DETECTED

## 2023-12-16 LAB — TYPE AND SCREEN
ABO/RH(D): A POS
Antibody Screen: NEGATIVE

## 2023-12-16 LAB — ABO/RH: ABO/RH(D): A POS

## 2023-12-16 LAB — APTT: aPTT: 30 s (ref 24–36)

## 2023-12-16 LAB — PROTIME-INR
INR: 1 (ref 0.8–1.2)
Prothrombin Time: 13.3 s (ref 11.4–15.2)

## 2023-12-16 LAB — MRSA NEXT GEN BY PCR, NASAL: MRSA by PCR Next Gen: NOT DETECTED

## 2023-12-16 LAB — HIV ANTIBODY (ROUTINE TESTING W REFLEX): HIV Screen 4th Generation wRfx: NONREACTIVE

## 2023-12-16 LAB — ETHANOL: Alcohol, Ethyl (B): 264 mg/dL — ABNORMAL HIGH (ref <15)

## 2023-12-16 MED ORDER — PHENOBARBITAL 32.4 MG PO TABS
32.4000 mg | ORAL_TABLET | Freq: Three times a day (TID) | ORAL | Status: DC
  Administered 2023-12-18 – 2023-12-19 (×3): 32.4 mg via ORAL
  Filled 2023-12-16 (×3): qty 1

## 2023-12-16 MED ORDER — ACETAMINOPHEN 650 MG RE SUPP
650.0000 mg | RECTAL | Status: DC | PRN
  Administered 2023-12-16: 650 mg via RECTAL
  Filled 2023-12-16: qty 1

## 2023-12-16 MED ORDER — LORAZEPAM 2 MG/ML IJ SOLN: 0.0000 mg | Freq: Two times a day (BID) | INTRAMUSCULAR | Status: DC

## 2023-12-16 MED ORDER — POTASSIUM CHLORIDE 10 MEQ/100ML IV SOLN
10.0000 meq | INTRAVENOUS | Status: AC
Start: 2023-12-16 — End: 2023-12-16
  Administered 2023-12-16 (×6): 10 meq via INTRAVENOUS
  Filled 2023-12-16 (×6): qty 100

## 2023-12-16 MED ORDER — SENNOSIDES-DOCUSATE SODIUM 8.6-50 MG PO TABS
1.0000 | ORAL_TABLET | Freq: Two times a day (BID) | ORAL | Status: DC
  Administered 2023-12-17: 1 via ORAL
  Filled 2023-12-16 (×2): qty 1

## 2023-12-16 MED ORDER — ADULT MULTIVITAMIN W/MINERALS CH
1.0000 | ORAL_TABLET | Freq: Every day | ORAL | Status: DC
  Administered 2023-12-17 – 2023-12-21 (×5): 1 via ORAL
  Filled 2023-12-16 (×6): qty 1

## 2023-12-16 MED ORDER — SODIUM CHLORIDE 3 % IV SOLN
INTRAVENOUS | Status: DC
  Filled 2023-12-16 (×5): qty 500

## 2023-12-16 MED ORDER — THIAMINE MONONITRATE 100 MG PO TABS
100.0000 mg | ORAL_TABLET | Freq: Every day | ORAL | Status: DC
  Administered 2023-12-17: 100 mg via ORAL
  Filled 2023-12-16 (×2): qty 1

## 2023-12-16 MED ORDER — ORAL CARE MOUTH RINSE: 15.0000 mL | OROMUCOSAL | Status: DC | PRN

## 2023-12-16 MED ORDER — LORAZEPAM 2 MG/ML IJ SOLN: 1.0000 mg | INTRAMUSCULAR | Status: DC | PRN

## 2023-12-16 MED ORDER — LORAZEPAM 1 MG PO TABS: 1.0000 mg | ORAL_TABLET | ORAL | Status: DC | PRN

## 2023-12-16 MED ORDER — ONDANSETRON HCL 4 MG/2ML IJ SOLN
4.0000 mg | Freq: Once | INTRAMUSCULAR | Status: AC
Start: 2023-12-16 — End: 2023-12-16
  Administered 2023-12-16: 4 mg via INTRAVENOUS
  Filled 2023-12-16: qty 2

## 2023-12-16 MED ORDER — IOHEXOL 350 MG/ML SOLN
75.0000 mL | Freq: Once | INTRAVENOUS | Status: AC | PRN
  Administered 2023-12-16: 75 mL via INTRAVENOUS

## 2023-12-16 MED ORDER — CHLORHEXIDINE GLUCONATE CLOTH 2 % EX PADS
6.0000 | MEDICATED_PAD | Freq: Every day | CUTANEOUS | Status: DC
  Administered 2023-12-16 – 2023-12-26 (×12): 6 via TOPICAL

## 2023-12-16 MED ORDER — SODIUM CHLORIDE 0.9% IV SOLUTION: Freq: Once | INTRAVENOUS | Status: DC

## 2023-12-16 MED ORDER — LORAZEPAM 2 MG/ML IJ SOLN
INTRAMUSCULAR | Status: AC
  Filled 2023-12-16: qty 1

## 2023-12-16 MED ORDER — PHENOBARBITAL 32.4 MG PO TABS
64.8000 mg | ORAL_TABLET | Freq: Three times a day (TID) | ORAL | Status: DC
  Administered 2023-12-16 – 2023-12-17 (×3): 64.8 mg via ORAL
  Filled 2023-12-16 (×3): qty 2

## 2023-12-16 MED ORDER — PANTOPRAZOLE SODIUM 40 MG IV SOLR
40.0000 mg | Freq: Every day | INTRAVENOUS | Status: DC
  Administered 2023-12-16 (×2): 40 mg via INTRAVENOUS
  Filled 2023-12-16 (×2): qty 10

## 2023-12-16 MED ORDER — LORAZEPAM 2 MG/ML IJ SOLN
0.0000 mg | Freq: Four times a day (QID) | INTRAMUSCULAR | Status: DC
  Administered 2023-12-16: 2 mg via INTRAVENOUS

## 2023-12-16 MED ORDER — CLEVIDIPINE BUTYRATE 0.5 MG/ML IV EMUL
0.0000 mg/h | INTRAVENOUS | Status: DC
  Administered 2023-12-16: 16 mg/h via INTRAVENOUS
  Administered 2023-12-16: 1 mg/h via INTRAVENOUS
  Administered 2023-12-16 (×2): 14 mg/h via INTRAVENOUS
  Administered 2023-12-16: 16 mg/h via INTRAVENOUS
  Administered 2023-12-16: 32 mg/h via INTRAVENOUS
  Administered 2023-12-16 (×2): 14 mg/h via INTRAVENOUS
  Administered 2023-12-17: 10 mg/h via INTRAVENOUS
  Administered 2023-12-17: 4 mg/h via INTRAVENOUS
  Administered 2023-12-17: 13 mg/h via INTRAVENOUS
  Administered 2023-12-18: 4 mg/h via INTRAVENOUS
  Administered 2023-12-18: 1 mg/h via INTRAVENOUS
  Administered 2023-12-19: 9 mg/h via INTRAVENOUS
  Administered 2023-12-19 – 2023-12-20 (×2): 2 mg/h via INTRAVENOUS
  Filled 2023-12-16 (×5): qty 100
  Filled 2023-12-16: qty 50
  Filled 2023-12-16 (×2): qty 100
  Filled 2023-12-16: qty 50
  Filled 2023-12-16: qty 100
  Filled 2023-12-16: qty 200
  Filled 2023-12-16: qty 100
  Filled 2023-12-16: qty 200
  Filled 2023-12-16: qty 100

## 2023-12-16 MED ORDER — FOLIC ACID 1 MG PO TABS
1.0000 mg | ORAL_TABLET | Freq: Every day | ORAL | Status: DC
  Administered 2023-12-17 – 2023-12-21 (×4): 1 mg via ORAL
  Filled 2023-12-16 (×6): qty 1

## 2023-12-16 MED ORDER — FENTANYL CITRATE PF 50 MCG/ML IJ SOSY
25.0000 ug | PREFILLED_SYRINGE | Freq: Once | INTRAMUSCULAR | Status: DC
  Filled 2023-12-16: qty 1

## 2023-12-16 MED ORDER — THIAMINE HCL 100 MG/ML IJ SOLN: 100.0000 mg | Freq: Every day | INTRAMUSCULAR | Status: DC

## 2023-12-16 MED ORDER — STROKE: EARLY STAGES OF RECOVERY BOOK
Freq: Once | Status: AC
  Filled 2023-12-16: qty 1

## 2023-12-16 MED ORDER — SODIUM CHLORIDE 0.9% FLUSH: 3.0000 mL | Freq: Once | INTRAVENOUS | Status: DC

## 2023-12-16 MED ORDER — ACETAMINOPHEN 160 MG/5ML PO SOLN: 650.0000 mg | ORAL | Status: DC | PRN

## 2023-12-16 MED ORDER — ACETAMINOPHEN 325 MG PO TABS
650.0000 mg | ORAL_TABLET | ORAL | Status: DC | PRN
  Administered 2023-12-20: 650 mg via ORAL
  Filled 2023-12-16 (×2): qty 2

## 2023-12-16 MED ORDER — CLEVIDIPINE BUTYRATE 0.5 MG/ML IV EMUL
INTRAVENOUS | Status: AC
Start: 2023-12-16 — End: ?
  Filled 2023-12-16: qty 50

## 2023-12-16 MED ORDER — DEXMEDETOMIDINE HCL IN NACL 400 MCG/100ML IV SOLN
0.0000 ug/kg/h | INTRAVENOUS | Status: DC
  Administered 2023-12-16: 0.4 ug/kg/h via INTRAVENOUS
  Administered 2023-12-17: 0.6 ug/kg/h via INTRAVENOUS
  Administered 2023-12-17: 0.4 ug/kg/h via INTRAVENOUS
  Administered 2023-12-18: 1 ug/kg/h via INTRAVENOUS
  Administered 2023-12-18 – 2023-12-19 (×2): 0.3 ug/kg/h via INTRAVENOUS
  Administered 2023-12-19: 0.4 ug/kg/h via INTRAVENOUS
  Administered 2023-12-20: 1.2 ug/kg/h via INTRAVENOUS
  Filled 2023-12-16 (×5): qty 100
  Filled 2023-12-16: qty 200
  Filled 2023-12-16 (×2): qty 100

## 2023-12-16 NOTE — Plan of Care (Signed)
  Problem: Education: Goal: Knowledge of disease or condition will improve Outcome: Progressing Goal: Knowledge of secondary prevention will improve (MUST DOCUMENT ALL) Outcome: Progressing Goal: Knowledge of patient specific risk factors will improve (DELETE if not current risk factor) Outcome: Progressing   Problem: Intracerebral Hemorrhage Tissue Perfusion: Goal: Complications of Intracerebral Hemorrhage will be minimized Outcome: Progressing   Problem: Coping: Goal: Will verbalize positive feelings about self Outcome: Progressing Goal: Will identify appropriate support needs Outcome: Progressing   Problem: Health Behavior/Discharge Planning: Goal: Ability to manage health-related needs will improve Outcome: Progressing Goal: Goals will be collaboratively established with patient/family Outcome: Progressing   Problem: Self-Care: Goal: Ability to participate in self-care as condition permits will improve Outcome: Progressing Goal: Verbalization of feelings and concerns over difficulty with self-care will improve Outcome: Progressing Goal: Ability to communicate needs accurately will improve Outcome: Progressing   Problem: Nutrition: Goal: Risk of aspiration will decrease Outcome: Progressing Goal: Dietary intake will improve Outcome: Progressing   Problem: Education: Goal: Knowledge of General Education information will improve Description: Including pain rating scale, medication(s)/side effects and non-pharmacologic comfort measures Outcome: Progressing   Problem: Health Behavior/Discharge Planning: Goal: Ability to manage health-related needs will improve Outcome: Progressing   Problem: Clinical Measurements: Goal: Ability to maintain clinical measurements within normal limits will improve Outcome: Progressing Goal: Will remain free from infection Outcome: Progressing Goal: Diagnostic test results will improve Outcome: Progressing Goal: Respiratory  complications will improve Outcome: Progressing Goal: Cardiovascular complication will be avoided Outcome: Progressing   Problem: Activity: Goal: Risk for activity intolerance will decrease Outcome: Progressing   Problem: Nutrition: Goal: Adequate nutrition will be maintained Outcome: Progressing   Problem: Coping: Goal: Level of anxiety will decrease Outcome: Progressing   Problem: Elimination: Goal: Will not experience complications related to urinary retention Outcome: Progressing   Problem: Pain Managment: Goal: General experience of comfort will improve and/or be controlled Outcome: Progressing   Problem: Safety: Goal: Ability to remain free from injury will improve Outcome: Progressing   Problem: Skin Integrity: Goal: Risk for impaired skin integrity will decrease Outcome: Progressing

## 2023-12-16 NOTE — Consult Note (Signed)
 Reason for Consult:right frontotemporal ICH Referring Physician: Leslie, Deschenes is an 33 y.o. male.  HPI: with a large right frontotemporal ICH , hx of uncontrolled hypertension, cirrhosis due to alcohol abuse, thrombocytopenia, presenting with left sided plegia at 2245 12/15/23. Last known well at 2200. CT at [facility] revealed the hemorrhage, repeat CT one hour later showed an increase in size of the hemorrhage. I was asked for surgical evaluation.   Past Medical History:  Diagnosis Date   Liver disease    Painful orthopaedic hardware (HCC) 07/2017   right tibia    Past Surgical History:  Procedure Laterality Date   BREATH TEK H PYLORI N/A 09/08/2016   Procedure: BREATH TEK [name];  Surgeon: [name], MD;  Location: [facility];  Service: Gastroenterology;  Laterality: N/A;   HARDWARE REMOVAL Left 07/27/2017   Procedure: Removal of deep implants right proximal and distal tibia;  Surgeon: Amada Backer, MD;  Location: [facility];  Service: Orthopedics;  Laterality: Left;   TENDON REPAIR Left 08/14/2014   Procedure: LEFT HAND WOUND EXPLORATION AND TENDON REPAIR;  Surgeon: Shellie Dials, MD;  Location: [facility] [place];  Service: Orthopedics;  Laterality: Left;   TIBIA IM NAIL INSERTION  07/31/2012   Procedure: INTRAMEDULLARY (IM) NAIL TIBIAL;  Surgeon: Amada Backer, MD;  Location: [facility];  Service: Orthopedics;  Laterality: Left;   UPPER GI ENDOSCOPY  07/25/2016    Family History  Problem Relation Age of Onset   Esophageal cancer Neg Hx    Liver disease Neg Hx    Colon cancer Neg Hx     Social History:  reports that he quit smoking about 9 years ago. His smoking use included cigarettes. He has never used smokeless tobacco. He reports current alcohol use. He reports that he does not use drugs.  Allergies: No Known Allergies  Medications: I have reviewed the patient's current medications.  Results for orders placed or performed during the  hospital encounter of 12/16/23 (from the past 48 hours)  Protime-INR     Status: None   Collection Time: 12/16/23 12:24 AM  Result Value Ref Range   Prothrombin Time 13.3 11.4 - 15.2 seconds   INR 1.0 0.8 - 1.2    Comment: (NOTE) INR goal varies based on device and disease states. Performed at [facility], [address] [place], [place] [address]   APTT     Status: None   Collection Time: 12/16/23 12:24 AM  Result Value Ref Range   aPTT 30 24 - 36 seconds    Comment: Performed at [facility], [address] [place], [place] [address]  CBC     Status: Abnormal   Collection Time: 12/16/23 12:24 AM  Result Value Ref Range   WBC 3.5 (L) 4.0 - 10.5 K/uL   RBC 4.81 4.22 - 5.81 MIL/uL   Hemoglobin 15.1 13.0 - 17.0 g/dL   HCT 27.2 53.6 - 64.4 %   MCV 95.2 80.0 - 100.0 fL   MCH 31.4 26.0 - 34.0 pg   MCHC 33.0 30.0 - 36.0 g/dL   RDW 03.4 74.2 - 59.5 %   Platelets 56 (L) 150 - 400 K/uL    Comment: Immature Platelet Fraction may be clinically indicated, consider ordering this additional test [id number] REPEATED TO VERIFY    nRBC 0.0 0.0 - 0.2 %    Comment: Performed at [facility], [address], [place], [place] [address]  Differential     Status: None   Collection Time: 12/16/23 12:24 AM  Result Value Ref Range   Neutrophils Relative % 54 %   Neutro Abs 1.9 1.7 - 7.7 K/uL   Lymphocytes Relative 27 %   Lymphs Abs 0.9 0.7 - 4.0 K/uL   Monocytes Relative 16 %   Monocytes Absolute 0.6 0.1 - 1.0 K/uL   Eosinophils Relative 2 %   Eosinophils Absolute 0.1 0.0 - 0.5 K/uL   Basophils Relative 1 %   Basophils Absolute 0.0 0.0 - 0.1 K/uL   Immature Granulocytes 0 %   Abs Immature Granulocytes 0.01 0.00 - 0.07 K/uL    Comment: Performed at [facility], [address], [place], [place] [address]  Comprehensive metabolic panel     Status: Abnormal   Collection Time: 12/16/23 12:24 AM  Result Value Ref Range   Sodium 142 135 - 145 mmol/L   Potassium 3.4  (L) 3.5 - 5.1 mmol/L   Chloride 104 98 - 111 mmol/L   CO2 23 22 - 32 mmol/L   Glucose, Bld 111 (H) 70 - 99 mg/dL    Comment: Glucose reference range applies only to samples taken after fasting for at least 8 hours.   BUN 6 6 - 20 mg/dL   Creatinine, Ser 6.04 0.61 - 1.24 mg/dL   Calcium 9.2 8.9 - 54.0 mg/dL   Total Protein 8.4 (H) 6.5 - 8.1 g/dL   Albumin 3.9 3.5 - 5.0 g/dL   AST 981 (H) 15 - 41 U/L   ALT 221 (H) 0 - 44 U/L   Alkaline Phosphatase 85 38 - 126 U/L   Total Bilirubin 0.9 0.0 - 1.2 mg/dL   GFR, Estimated >19 >14 mL/min    Comment: (NOTE) Calculated using the CKD-EPI Creatinine Equation (2021)    Anion gap 15 5 - 15    Comment: Performed at [facility], [address] [place], [place] [address]  Ethanol     Status: Abnormal   Collection Time: 12/16/23 12:24 AM  Result Value Ref Range   Alcohol, Ethyl (B) 264 (H) <15 mg/dL    Comment: Please note change in reference range. (NOTE) For medical purposes only. Performed at [facility], [address] [place], [place] [address]   CBG monitoring, ED     Status: Abnormal   Collection Time: 12/16/23 12:26 AM  Result Value Ref Range   Glucose-Capillary 103 (H) 70 - 99 mg/dL    Comment: Glucose reference range applies only to samples taken after fasting for at least 8 hours.   Comment 1 Notify RN    Comment 2 Document in Chart   I-stat chem 8, ED     Status: Abnormal   Collection Time: 12/16/23 12:30 AM  Result Value Ref Range   Sodium 142 135 - 145 mmol/L   Potassium 3.3 (L) 3.5 - 5.1 mmol/L   Chloride 103 98 - 111 mmol/L   BUN 4 (L) 6 - 20 mg/dL   Creatinine, Ser 8.65 0.61 - 1.24 mg/dL   Glucose, Bld 784 (H) 70 - 99 mg/dL    Comment: Glucose reference range applies only to samples taken after fasting for at least 8 hours.   Calcium, Ion 1.04 (L) 1.15 - 1.40 mmol/L   TCO2 25 22 - 32 mmol/L   Hemoglobin 16.3 13.0 - 17.0 g/dL   HCT 69.6 29.5 - 28.4 %    CT HEAD CODE STROKE WO CONTRAST Result Date:  12/16/2023 CLINICAL DATA:  Code stroke. Follow-up examination for acute neuro deficit, stroke. EXAM: CT HEAD WITHOUT CONTRAST TECHNIQUE: Contiguous axial images were obtained from the base of the skull through the vertex without intravenous contrast. RADIATION DOSE REDUCTION: This exam was performed according to the departmental dose-optimization program which includes automated exposure control, adjustment of the mA and/or kV according to patient size and/or use of iterative reconstruction technique. COMPARISON:  CT from earlier the same day. FINDINGS: Brain: Previously identified right cerebral hemorrhage again seen, now measuring 6.0 x 4.1 x 4.2 cm (estimated volume 52 mL, previously 35 mL). Surrounding edema with partial effacement of the right lateral ventricle. Associated 4 mm of right-to-left shift is now seen. Additionally, now seen is small volume hemorrhage within the right lateral ventricle, consistent with intraventricular extension. No other acute intracranial hemorrhage. No other acute large vessel territory infarct. No mass lesion. No extra-axial fluid collection. Vascular: Contrast material from prior CTA present within the intracranial circulation. Skull: No new finding. Sinuses/Orbits: No new finding. Other: None. IMPRESSION: 1. Interval increase in size of right cerebral hemorrhage, now measuring 6.0 x 4.1 x 4.2 cm (estimated volume 52 mL, previously 35 mL). Surrounding edema with partial effacement of the right lateral ventricle with progressive 4 mm of right-to-left shift. 2. Interval intraventricular extension with small volume hemorrhage within the right lateral ventricle. No hydrocephalus. Electronically Signed   By: Virgia Griffins M.D.   On: 12/16/2023 02:08   CT HEAD CODE STROKE WO CONTRAST Result Date: 12/16/2023 CLINICAL DATA:  Code stroke.  Acute neuro deficit, stroke. EXAM: CT HEAD WITHOUT CONTRAST CT ANGIOGRAPHY HEAD AND NECK TECHNIQUE: Multidetector CT imaging of the head and  neck was performed using the standard protocol during bolus administration of intravenous contrast. Multiplanar CT image reconstructions and MIPs were obtained to evaluate the vascular anatomy. Carotid stenosis measurements (when applicable) are obtained utilizing NASCET criteria, using the distal internal carotid diameter as the denominator. RADIATION DOSE REDUCTION: This exam was performed according to the departmental dose-optimization program which includes automated exposure control, adjustment of the mA and/or kV according to patient size and/or use of iterative reconstruction technique. CONTRAST:  75mL OMNIPAQUE  IOHEXOL  350 MG/ML SOLN COMPARISON:  Study from 08/23/2012 FINDINGS: CT HEAD FINDINGS Brain: Acute intraparenchymal hemorrhage involving the right frontotemporal region measures 5.5 x 3.6 x 3.5 cm (estimated volume 35 mL). Localized edema with partial effacement of the right lateral ventricle. No hydrocephalus. No other acute intracranial hemorrhage or large vessel territory infarct. No mass lesion or extra-axial fluid collection. Vascular: No abnormal hyperdense vessel. Skull: Scalp soft tissues and calvarium grossly within normal limits. Sinuses/Orbits: Globes orbital soft tissues demonstrate no acute finding. Remote posttraumatic defect at the left lamina papyracea. Paranasal sinuses are largely clear. No significant mastoid effusion. Other: None. ASPECTS [facility] Stroke Program Early CT Score) Acute ICH, does not apply. CTA NECK FINDINGS Aortic arch: Standard branching. Imaged portion shows no evidence of aneurysm or dissection. No significant stenosis of the major arch vessel origins. Right carotid system: No evidence of dissection, stenosis (50% or greater), or occlusion. Left carotid system: No evidence of dissection, stenosis (50% or greater), or occlusion. Vertebral arteries: No evidence of dissection, stenosis (50% or greater), or occlusion. Skeleton: No worrisome osseous lesions. Other neck:  No other acute finding. Upper chest: No other acute finding. Review of the MIP images confirms the above findings CTA HEAD FINDINGS Anterior circulation: Both internal carotid arteries are widely patent through the siphons without stenosis. A1 segments, anterior communicating artery  complex, and anterior cerebral arteries are widely patent. No M1 stenosis or occlusion. Distal MCA branches perfused and fairly symmetric. Posterior circulation: Both V4 segments patent without stenosis. Right vertebral artery dominant. Both PICA patent. Basilar widely patent. Superior cerebellar and posterior cerebral arteries patent bilaterally. Venous sinuses: Patent allowing for timing the contrast bolus. Anatomic variants: None significant. No intracranial aneurysm. Two adjacent foci of contrast enhancement measuring up to 9 mm seen within the bed of the right cerebral hematoma, consistent with active contrast extravasation/spot sign (series 6, image 336). No other underlying vascular abnormality. Review of the MIP images confirms the above findings IMPRESSION: CT HEAD: 5.5 x 3.6 x 3.5 cm (estimated volume 35 mL) acute intraparenchymal hemorrhage involving the right frontotemporal region. Localized edema without significant midline shift. CTA HEAD AND NECK: 1. Focus of contrast enhancement within the bed of the right cerebral hematoma, consistent with active contrast extravasation/spot sign. No other underlying vascular abnormality. 2. Otherwise negative CTA of the head and neck. No large vessel occlusion or other emergent finding. No hemodynamically significant or correctable stenosis. These results were communicated to Dr. Alecia Ames at 1:01 am on 12/16/2023 by text page via the [facility] messaging system. Electronically Signed   By: Virgia Griffins M.D.   On: 12/16/2023 01:40   CT ANGIO HEAD NECK W WO CM (CODE STROKE) Result Date: 12/16/2023 CLINICAL DATA:  Code stroke.  Acute neuro deficit, stroke. EXAM: CT HEAD WITHOUT  CONTRAST CT ANGIOGRAPHY HEAD AND NECK TECHNIQUE: Multidetector CT imaging of the head and neck was performed using the standard protocol during bolus administration of intravenous contrast. Multiplanar CT image reconstructions and MIPs were obtained to evaluate the vascular anatomy. Carotid stenosis measurements (when applicable) are obtained utilizing NASCET criteria, using the distal internal carotid diameter as the denominator. RADIATION DOSE REDUCTION: This exam was performed according to the departmental dose-optimization program which includes automated exposure control, adjustment of the mA and/or kV according to patient size and/or use of iterative reconstruction technique. CONTRAST:  75mL OMNIPAQUE  IOHEXOL  350 MG/ML SOLN COMPARISON:  Study from 08/23/2012 FINDINGS: CT HEAD FINDINGS Brain: Acute intraparenchymal hemorrhage involving the right frontotemporal region measures 5.5 x 3.6 x 3.5 cm (estimated volume 35 mL). Localized edema with partial effacement of the right lateral ventricle. No hydrocephalus. No other acute intracranial hemorrhage or large vessel territory infarct. No mass lesion or extra-axial fluid collection. Vascular: No abnormal hyperdense vessel. Skull: Scalp soft tissues and calvarium grossly within normal limits. Sinuses/Orbits: Globes orbital soft tissues demonstrate no acute finding. Remote posttraumatic defect at the left lamina papyracea. Paranasal sinuses are largely clear. No significant mastoid effusion. Other: None. ASPECTS [facility] Stroke Program Early CT Score) Acute ICH, does not apply. CTA NECK FINDINGS Aortic arch: Standard branching. Imaged portion shows no evidence of aneurysm or dissection. No significant stenosis of the major arch vessel origins. Right carotid system: No evidence of dissection, stenosis (50% or greater), or occlusion. Left carotid system: No evidence of dissection, stenosis (50% or greater), or occlusion. Vertebral arteries: No evidence of dissection,  stenosis (50% or greater), or occlusion. Skeleton: No worrisome osseous lesions. Other neck: No other acute finding. Upper chest: No other acute finding. Review of the MIP images confirms the above findings CTA HEAD FINDINGS Anterior circulation: Both internal carotid arteries are widely patent through the siphons without stenosis. A1 segments, anterior communicating artery complex, and anterior cerebral arteries are widely patent. No M1 stenosis or occlusion. Distal MCA branches perfused and fairly symmetric. Posterior circulation: Both V4 segments patent without  stenosis. Right vertebral artery dominant. Both PICA patent. Basilar widely patent. Superior cerebellar and posterior cerebral arteries patent bilaterally. Venous sinuses: Patent allowing for timing the contrast bolus. Anatomic variants: None significant. No intracranial aneurysm. Two adjacent foci of contrast enhancement measuring up to 9 mm seen within the bed of the right cerebral hematoma, consistent with active contrast extravasation/spot sign (series 6, image 336). No other underlying vascular abnormality. Review of the MIP images confirms the above findings IMPRESSION: CT HEAD: 5.5 x 3.6 x 3.5 cm (estimated volume 35 mL) acute intraparenchymal hemorrhage involving the right frontotemporal region. Localized edema without significant midline shift. CTA HEAD AND NECK: 1. Focus of contrast enhancement within the bed of the right cerebral hematoma, consistent with active contrast extravasation/spot sign. No other underlying vascular abnormality. 2. Otherwise negative CTA of the head and neck. No large vessel occlusion or other emergent finding. No hemodynamically significant or correctable stenosis. These results were communicated to Dr. Alecia Ames at 1:01 am on 12/16/2023 by text page via the [facility] messaging system. Electronically Signed   By: Virgia Griffins M.D.   On: 12/16/2023 01:40    Review of Systems  Cardiovascular:         Hypertension, not controlled   Blood pressure (!) 129/94, pulse 99, temperature 98.9 F (37.2 C), temperature source Oral, resp. rate 15, weight 63.4 kg, SpO2 97%. Physical Exam Neurological:     Mental Status: He is lethargic.     Motor: Weakness present.     Comments: Oriented x 2, able to name his sister and wife.  No movement left upper extremity. Minimal movement left lower extremity. Perrl, full eom Tongue midline, uvula midline Will follow some commands, needs constant stimulation Unable to perform reliable motor or sensory examination due to level of consciousness     Assessment/Plan: Raygen Castiglioni is a 33 y.o. male With a platelet count of 56k at 0024 and known hx of hypertension. Dr. Alecia Ames has ordered 2units of platelets to be transfused. Most likely in addition to the thrombocytopenia he has dysfunctional platelets secondary to his liver disease. Will repeat Head CT after transfusion. Very low threshold for operative evacuation, but left sided plegia will not improve significantly with hematoma evacuation. Will monitor closely.   Audie Bleacher 12/16/2023, 2:34 AM

## 2023-12-16 NOTE — ED Notes (Signed)
 Dr. Alphonzo Jenkins at bedside.

## 2023-12-16 NOTE — H&P (Signed)
 NEUROLOGY H&P NOTE   Date of service: Dec 16, 2023 Patient Name: Troy Bishop MRN:  [id number] DOB:  1990-10-27 Chief Complaint: "Left sided weakness"  History of Present Illness  Jurgen Satkowiak is a 33 y.o. male with hx of hypertension, not taking medications, as well as a single previous seizure who presents with left-sided weakness.  He was last known well at 10 PM, and then stood up at 10:45 PM and developed left-sided weakness.  Due to the symptoms a code stroke was activated and the patient was evaluated emergently as a code stroke.  He was taken for CT which revealed a fairly large subcortical hemorrhage on the left.   Last known well: 10 PM Modified rankin score: 0-Completely asymptomatic and back to baseline post- stroke ICH Score: 1 tNKASE: Not offered due to ICH Thrombectomy: not offered due to ICH NIHSS components Score: Comment  1a Level of Conscious 0[]  1[x]  2[]  3[]      1b LOC Questions 0[x]  1[]  2[]       1c LOC Commands 0[x]  1[]  2[]       2 Best Gaze 0[]  1[x]  2[]       3 Visual 0[]  1[]  2[x]  3[]      4 Facial Palsy 0[]  1[x]  2[]  3[]      5a Motor Arm - left 0[]  1[]  2[]  3[]  4[x]  UN[]    5b Motor Arm - Right 0[x]  1[]  2[]  3[]  4[]  UN[]    6a Motor Leg - Left 0[]  1[]  2[]  3[]  4[x]  UN[]    6b Motor Leg - Right 0[x]  1[]  2[]  3[]  4[]  UN[]    7 Limb Ataxia 0[x]  1[]  2[]  UN[]      8 Sensory 0[]  1[]  2[x]  UN[]      9 Best Language 0[x]  1[]  2[]  3[]      10 Dysarthria 0[]  1[]  2[x]  UN[]      11 Extinct. and Inattention 0[]  1[x]  2[]       TOTAL: 18       Past History   Past Medical History:  Diagnosis Date   Liver disease    Painful orthopaedic hardware (HCC) 07/2017   right tibia   Past Surgical History:  Procedure Laterality Date   BREATH TEK H PYLORI N/A 09/08/2016   Procedure: BREATH TEK [name];  Surgeon: [name], MD;  Location: [facility];  Service: Gastroenterology;  Laterality: N/A;   HARDWARE REMOVAL Left 07/27/2017   Procedure: Removal of deep implants right proximal and  distal tibia;  Surgeon: Amada Backer, MD;  Location: [facility];  Service: Orthopedics;  Laterality: Left;   TENDON REPAIR Left 08/14/2014   Procedure: LEFT HAND WOUND EXPLORATION AND TENDON REPAIR;  Surgeon: Shellie Dials, MD;  Location: [facility];  Service: Orthopedics;  Laterality: Left;   TIBIA IM NAIL INSERTION  07/31/2012   Procedure: INTRAMEDULLARY (IM) NAIL TIBIAL;  Surgeon: Amada Backer, MD;  Location: [facility];  Service: Orthopedics;  Laterality: Left;   UPPER GI ENDOSCOPY  07/25/2016   Family History  Problem Relation Age of Onset   Esophageal cancer Neg Hx    Liver disease Neg Hx    Colon cancer Neg Hx    Social History   Socioeconomic History   Marital status: Married    Spouse name: Not on file   Number of children: 4   Years of education: Not on file   Highest education level: Not on file  Occupational History   Not on file  Tobacco Use   Smoking status: Former    Current  packs/day: 0.00    Types: Cigarettes    Quit date: 05/13/2014    Years since quitting: 9.6   Smokeless tobacco: Never  Vaping Use   Vaping status: Never Used  Substance and Sexual Activity   Alcohol use: Yes    Comment: 4 times/week   Drug use: No   Sexual activity: Not on file  Other Topics Concern   Not on file  Social History Narrative   ** Merged History Encounter **       Social Drivers of Health   Financial Resource Strain: Not on file  Food Insecurity: Not on file  Transportation Needs: Not on file  Physical Activity: Not on file  Stress: Not on file  Social Connections: Not on file   No Known Allergies  Medications  (Not in a hospital admission)    Vitals   Vitals:   12/16/23 0100 12/16/23 0104 12/16/23 0115 12/16/23 0130  BP: (!) 171/112 (!) 147/114 (!) 168/109 (!) 148/100  Pulse: (!) 101 (!) 108 (!) 115 (!) 109  Resp: 19 20 (!) 23 17  Temp:      TempSrc:      SpO2: 100% 100% 100% 100%  Weight:         Body mass index is 22.56  kg/m.  Physical Exam   Constitutional: Appears well-developed and well-nourished.  Psych: Affect appropriate to situation.  Eyes: No scleral injection.  HENT: No OP obstruction.  Head: Normocephalic.  Cardiovascular: Normal rate and regular rhythm.  Respiratory: Effort normal, non-labored breathing.  GI: Soft.  No distension. There is no tenderness.  Skin: WDI.   Neurologic Examination   Neuro: Mental Status: Patient is lethargic, but easily arousable.  He is oriented to place, month, age. Cranial Nerves: II: He has a left hemianopia pupils are equal, round, and reactive to light.   III,IV, VI: He has a right gaze preference, does cross midline to the left VII: Facial movement is weak on the left VIII: hearing is intact to voice X: Uvula elevates symmetrically XII: tongue with left deviation Motor: Bulk is normal. 5/5 strength was present on the right, he is hemiplegic on the left Sensory: Sensation is symmetric to light touch and temperature in the arms and legs. Cerebellar: Does not perform       Labs   CBC:  Recent Labs  Lab 12/16/23 0024 12/16/23 0030  WBC 3.5*  --   NEUTROABS 1.9  --   HGB 15.1 16.3  HCT 45.8 48.0  MCV 95.2  --   PLT 56*  --     Basic Metabolic Panel:  Lab Results  Component Value Date   NA 142 12/16/2023   K 3.3 (L) 12/16/2023   CO2 23 12/16/2023   GLUCOSE 106 (H) 12/16/2023   BUN 4 (L) 12/16/2023   CREATININE 1.00 12/16/2023   CALCIUM 9.2 12/16/2023   GFRNONAA >60 12/16/2023   GFRAA >60 09/02/2017     Urine Drug Screen:     Component Value Date/Time   LABOPIA NONE DETECTED 07/31/2012 1953   COCAINSCRNUR NONE DETECTED 07/31/2012 1953   LABBENZ NONE DETECTED 07/31/2012 1953   AMPHETMU NONE DETECTED 07/31/2012 1953   THCU NONE DETECTED 07/31/2012 1953   LABBARB NONE DETECTED 07/31/2012 1953    Alcohol Level     Component Value Date/Time   ETH 264 (H) 12/16/2023 0024   INR  Lab Results  Component Value Date    INR 1.0 12/16/2023   APTT  Lab Results  Component  Value Date   APTT 30 12/16/2023     CT Head without contrast(Personally reviewed): Large intraparenchymal hematoma on the left  CT angio Head and Neck with contrast(Personally reviewed): There is active extravasation into the his hematoma  Impression   Jaxx Tlatelpa is a 33 y.o. male with a history of alcoholism, hypertension, not on medications, who presents with a large hemorrhage that is subcortically seated with significant spread.  It does come very near the cortical surface.  Possible etiologies include stroke with hemorrhagic conversion, underlying vascular malformation, hypertensive hemorrhage.  Over the course of my evaluation, the patient was continuing to worsen, and especially given his young age, I did ask neurosurgery for further evaluation for consideration of surgical intervention.  Primary Diagnosis:  Nontraumatic intracerebral hemorrhage in hemisphere, subcortical  Secondary Diagnosis: Brain compression, Essential (primary) hypertension, and Hypertension Emergency (SBP > 180 or DBP > 120 & end organ damage)  Recommendations  1) Admit to ICU 2) no antiplatelets or anticoagulants 3) blood pressure control with goal systolic 130 - 150 4) Frequent neuro checks 5) PT,OT,ST 6) neurosurgical consultation ______________________________________________________________________  This patient is critically ill and at significant risk of neurological worsening, death and care requires constant monitoring of vital signs, hemodynamics,respiratory and cardiac monitoring, neurological assessment, discussion with family, other specialists and medical decision making of high complexity. I spent 65 minutes of neurocritical care time  in the care of  this patient. This was time spent independent of any time provided by nurse practitioner or PA.  Ann Keto, MD [facility]   If 7pm- 7am, please page neurology on  call as listed in AMION. 12/16/2023  2:20 AM

## 2023-12-16 NOTE — Progress Notes (Signed)
 Patient ID: Troy Bishop, male   DOB: 01-14-1991, 33 y.o.   MRN: [id number] BP (!) 142/99   Pulse (!) 140   Temp 100.3 F (37.9 C) (Axillary) Comment: ice packs applied  Resp 18   Wt 63.4 kg   SpO2 96%   BMI 22.56 kg/m  Perrl, left facial droop Lethargic, follows some commands Plegic on left side ICH unchanged on today's scan Have spoken with neurology. No need at this time for operative evacuation.  Will follow

## 2023-12-16 NOTE — ED Notes (Signed)
 UNMATCHED BLOOD PRODUCT NOTE  Compare the patient ID on the blood tag to the patient ID on the hospital armband and Blood Bank armband. Then confirm the unit number on the blood tag matches the unit number on the blood product.  If a discrepancy is discovered return the product to blood bank immediately.   Blood Product Type: Platelets (Pheresed)  Unit #: (Found on blood product bag, begins with W) [id number]  Product Code #: (Found on blood product bag, begins with E) [id number] [id number]   Start Time: 0445  Starting Rate: 800 ml/hr  Rate increase/decreased  (if applicable):      ml/hr  Rate changed time (if applicable):    Stop Time: 0510   All Other Documentation should be documented within the Blood Admin Flowsheet per policy.

## 2023-12-16 NOTE — Progress Notes (Signed)
 OT Cancellation Note  Patient Details Name: Troy Bishop MRN: [id number] DOB: [birth year]   Cancelled Treatment:    Reason Eval/Treat Not Completed: Active bedrest order. Will follow up as pt medically ready and schedule allows.   Karilyn Ouch, OTR/L [facility] Office: [phone]   Emery Hans 12/16/2023, 8:10 AM

## 2023-12-16 NOTE — Consult Note (Signed)
 NAMEYoltzin Bishop, MRN:  [id number], DOB:  1991-06-27, LOS: 0 ADMISSION DATE:  12/16/2023, CONSULTATION DATE:  12/16/2023 REFERRING MD: Stroke - MD, CHIEF COMPLAINT: ICH with evolving EtOH withdrawal  History of Present Illness:  Troy Bishop is a 33 year old male with past medical history significant for EtOH use and liver disease who scented to the ED at [facility] 5/9 after syncopal episode with left-sided weakness.  Presented as code stroke.  Code stroke CT revealed a large right IPH with no significant midline shift.  CTA head and neck concerning for contrast extravasation/spots on for which neurosurgery was consulted.  Patient was admitted to neuro ICU for close monitoring under the care of neurology and neurosurgery.  Mid morning of 5/10 patient became recently agitated with tachycardia additional medical history was obtained and patient's family reports patient drinks alcohol daily.  Concern for evolving alcohol withdrawal for which PCCM was consulted.  Pertinent  Medical History  Liver disease Alcohol use  Significant Hospital Events: Including procedures, antibiotic start and stop dates in addition to other pertinent events   5/9 presented after syncopal episode with left-sided weakness noted, code stroke CT confirmed IPH.  CTA head and neck concern for contrast dissociation/, neurosurgery consulted with plans to closely observe  5/10 neurologically remains able with repeat CT unchanged with right cerebral hematoma with 6 mm midline shift.  Concerns for evolving EtOH withdrawal  Interim History / Subjective:  Sleepy post Ativan  administration  Objective    Blood pressure (!) 144/98, pulse (!) 122, temperature 100.3 F (37.9 C), temperature source Axillary, resp. rate (!) 32, weight 63.4 kg, SpO2 96%.        Intake/Output Summary (Last 24 hours) at 12/16/2023 0954 Last data filed at 12/16/2023 0900 Gross per 24 hour  Intake 460.65 ml  Output 550 ml  Net -89.35 ml   Filed Weights    12/16/23 0000  Weight: 63.4 kg    Examination: General: Acute ill-appearing adult male lying in bed sleepy post Ativan  administration HEENT: C/AT, MM pink/moist, PERRL,  Neuro: Will open eyes to verbal response but sleepy after receiving Ativan , slight tremor seen in upper extremities CV: s1s2 regular rate and rhythm, no murmur, rubs, or gallops,  PULM: Clear to auscultation, no increased work of breathing, on room air GI: soft, bowel sounds active in all 4 quadrants, non-tender, non-distended Extremities: warm/dry, no edema  Skin: no rashes or lesions   Resolved Hospital Problem list     Assessment & Plan:  Nontraumatic intracerebral hematoma with brain compression - Repeat head CT early a.m. 5/10 with unchanged right cerebral hematoma with 6 mm midline shift P: Neurology and neurosurgery following, appreciate assistance Frequent neurochecks Neuroprotective measures No antiplatelets or anticoagulants SBP goal 130-150 Secondary stroke PT/OT/SLP  At risk for airway compromise - The setting of large ICH and evolving alcohol withdrawal requiring benzodiazepines/sedating medications P: Close monitoring of airway in ICU Aspiration precautions N.p.o. 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 with elevated LFTs -Cirrhosis seen on ultrasound 8/24 Daily alcohol consumption P: Alcohol cessation education when appropriate Avoid hepatotoxins  Leukopenia Thrombocytopenia -Presumed secondary to large-volume ongoing alcohol use P: Trend CBC Monitor for signs of bleeding Transfuse per protocol Hemoglobin goal greater than 7 Platelet goal greater than 20  Hypokalemia P: Supplement as needed  Best Practice (right click and "Reselect all SmartList Selections" daily)  Per primary  Labs   CBC: Recent  Labs  Lab 12/16/23 0024 12/16/23 0030  WBC 3.5*  --    NEUTROABS 1.9  --   HGB 15.1 16.3  HCT 45.8 48.0  MCV 95.2  --   PLT 56*  --     Basic Metabolic Panel: Recent Labs  Lab 12/16/23 0024 12/16/23 0030 12/16/23 0638  NA 142 142 141  K 3.4* 3.3*  --   CL 104 103  --   CO2 23  --   --   GLUCOSE 111* 106*  --   BUN 6 4*  --   CREATININE 0.72 1.00  --   CALCIUM 9.2  --   --    GFR: CrCl cannot be calculated (Unknown ideal weight.). Recent Labs  Lab 12/16/23 0024  WBC 3.5*    Liver Function Tests: Recent Labs  Lab 12/16/23 0024  AST 272*  ALT 221*  ALKPHOS 85  BILITOT 0.9  PROT 8.4*  ALBUMIN 3.9   No results for input(s): "LIPASE", "AMYLASE" in the last 168 hours. No results for input(s): "AMMONIA" in the last 168 hours.  ABG    Component Value Date/Time   PHART 7.310 (L) 08/01/2012 1624   PCO2ART 51.3 (H) 08/01/2012 1624   PO2ART 48.0 (L) 08/01/2012 1624   HCO3 25.8 (H) 08/01/2012 1624   TCO2 25 12/16/2023 0030   ACIDBASEDEF 1.0 08/01/2012 1624   O2SAT 79.0 08/01/2012 1624     Coagulation Profile: Recent Labs  Lab 12/16/23 0024  INR 1.0    Cardiac Enzymes: No results for input(s): "CKTOTAL", "CKMB", "CKMBINDEX", "TROPONINI" in the last 168 hours.  HbA1C: No results found for: "HGBA1C"  CBG: Recent Labs  Lab 12/16/23 0026  GLUCAP 103*    Review of Systems:   Unable to assess   Past Medical History:  He,  has a past medical history of Liver disease and Painful orthopaedic hardware (HCC) (07/2017).   Surgical History:   Past Surgical History:  Procedure Laterality Date   BREATH TEK H PYLORI N/A 09/08/2016   Procedure: BREATH TEK [name];  Surgeon: [name], MD;  Location: [facility];  Service: Gastroenterology;  Laterality: N/A;   HARDWARE REMOVAL Left 07/27/2017   Procedure: Removal of deep implants right proximal and distal tibia;  Surgeon: Amada Backer, MD;  Location: [facility];  Service: Orthopedics;  Laterality: Left;   TENDON REPAIR Left 08/14/2014    Procedure: LEFT HAND WOUND EXPLORATION AND TENDON REPAIR;  Surgeon: Shellie Dials, MD;  Location: [facility];  Service: Orthopedics;  Laterality: Left;   TIBIA IM NAIL INSERTION  07/31/2012   Procedure: INTRAMEDULLARY (IM) NAIL TIBIAL;  Surgeon: Amada Backer, MD;  Location: [facility];  Service: Orthopedics;  Laterality: Left;   UPPER GI ENDOSCOPY  07/25/2016     Social History:   reports that he quit smoking about 9 years ago. His smoking use included cigarettes. He has never used smokeless tobacco. He reports current alcohol use. He reports that he does not use drugs.   Family History:  His family history is negative for Esophageal cancer, Liver disease, and Colon cancer.   Allergies No Known Allergies   Home Medications  Prior to Admission medications   Medication Sig Start Date End Date Taking? Authorizing Provider  risankizumab-rzaa (SKYRIZI PEN) 150 MG/ML pen Inject 150 mg as directed every 3 (three) months. 05/22/23 05/21/24 Yes [provider]     Critical care time:   CRITICAL CARE Performed by: Ajia Chadderdon D. Raquel Cables  Total critical care time: 40 minutes  Critical care time was exclusive of separately billable procedures and treating other patients.  Critical care was necessary to treat or prevent imminent or life-threatening deterioration.  Critical care was time spent personally by me on the following activities: development of treatment plan with patient and/or surrogate as well as nursing, discussions with consultants, evaluation of patient's response to treatment, examination of patient, obtaining history from patient or surrogate, ordering and performing treatments and interventions, ordering and review of laboratory studies, ordering and review of radiographic studies, pulse oximetry and re-evaluation of patient's condition.  Miraya Cudney D. Harris, NP-C [facility] Personal contact information can be found on Amion  If no contact  or response made please call [phone] 12/16/2023, 11:02 AM

## 2023-12-16 NOTE — ED Notes (Signed)
 Blood consent form signed by spouse, Drema Genta.

## 2023-12-16 NOTE — ED Triage Notes (Signed)
 Pt arrived from home via [facility] code stroke. LKW 2300. Pt attempted to get up from couch and went limp on left side, slurred speech.

## 2023-12-16 NOTE — ED Notes (Signed)
Pt transported to CT via stretcher by this RN  

## 2023-12-16 NOTE — ED Provider Notes (Signed)
 [place] EMERGENCY DEPARTMENT AT [facility] Provider Note   CSN: [id number] Arrival date & time: 12/16/23  0021     History  No chief complaint on file.   Troy Bishop is a 33 y.o. male.  The history is provided by the EMS personnel.  He is reported to have collapsed and noted to be weak on his left side.  He does have history of having had a seizure in the past, not on anticonvulsants.  There is no loss of consciousness today.   Home Medications Prior to Admission medications   Medication Sig Start Date End Date Taking? Authorizing Provider  amoxicillin -clavulanate (AUGMENTIN ) 875-125 MG tablet Take 1 tablet by mouth every 12 (twelve) hours. 11/12/23   Raspet, Erin K, PA-C  famotidine  (PEPCID ) 40 MG tablet Take 1 tablet (40 mg total) by mouth daily. 06/13/23   [name], MD  risankizumab-rzaa (SKYRIZI PEN) 150 MG/ML pen Inject 150 mg as directed. 05/22/23 05/21/24  [provider]      Allergies    Patient has no known allergies.    Review of Systems   Review of Systems  Unable to perform ROS: Acuity of condition    Physical Exam Updated Vital Signs BP (!) 129/94   Pulse 99   Temp 98.9 F (37.2 C) (Oral)   Resp 15   Wt 63.4 kg   SpO2 97%   BMI 22.56 kg/m  Physical Exam Vitals and nursing note reviewed.   33 year old male, resting comfortably and in no acute distress. Vital signs are significant for elevated diastolic blood pressure. Oxygen saturation is 97%, which is normal. Head is normocephalic and atraumatic. PERRLA, EOMI.  Neck is nontender and supple. Lungs are clear without rales, wheezes, or rhonchi. Chest is nontender. Heart has regular rate and rhythm without murmur. Abdomen is soft, flat, nontender. Extremities have no signs of trauma. Skin is warm and dry without rash. Neurologic: Awake and alert, mild left central facial droop, moderate left hemiparesis..  ED Results / Procedures / Treatments   Labs (all labs ordered  are listed, but only abnormal results are displayed) Labs Reviewed  CBC - Abnormal; Notable for the following components:      Result Value   WBC 3.5 (*)    Platelets 56 (*)    All other components within normal limits  COMPREHENSIVE METABOLIC PANEL WITH GFR - Abnormal; Notable for the following components:   Potassium 3.4 (*)    Glucose, Bld 111 (*)    Total Protein 8.4 (*)    AST 272 (*)    ALT 221 (*)    All other components within normal limits  ETHANOL - Abnormal; Notable for the following components:   Alcohol, Ethyl (B) 264 (*)    All other components within normal limits  I-STAT CHEM 8, ED - Abnormal; Notable for the following components:   Potassium 3.3 (*)    BUN 4 (*)    Glucose, Bld 106 (*)    Calcium, Ion 1.04 (*)    All other components within normal limits  CBG MONITORING, ED - Abnormal; Notable for the following components:   Glucose-Capillary 103 (*)    All other components within normal limits  PROTIME-INR  APTT  DIFFERENTIAL  HIV ANTIBODY (ROUTINE TESTING W REFLEX)   Radiology CT HEAD CODE STROKE WO CONTRAST Result Date: 12/16/2023 CLINICAL DATA:  Code stroke. Follow-up examination for acute neuro deficit, stroke. EXAM: CT HEAD WITHOUT CONTRAST TECHNIQUE: Contiguous axial images  were obtained from the base of the skull through the vertex without intravenous contrast. RADIATION DOSE REDUCTION: This exam was performed according to the departmental dose-optimization program which includes automated exposure control, adjustment of the mA and/or kV according to patient size and/or use of iterative reconstruction technique. COMPARISON:  CT from earlier the same day. FINDINGS: Brain: Previously identified right cerebral hemorrhage again seen, now measuring 6.0 x 4.1 x 4.2 cm (estimated volume 52 mL, previously 35 mL). Surrounding edema with partial effacement of the right lateral ventricle. Associated 4 mm of right-to-left shift is now seen. Additionally, now seen is  small volume hemorrhage within the right lateral ventricle, consistent with intraventricular extension. No other acute intracranial hemorrhage. No other acute large vessel territory infarct. No mass lesion. No extra-axial fluid collection. Vascular: Contrast material from prior CTA present within the intracranial circulation. Skull: No new finding. Sinuses/Orbits: No new finding. Other: None. IMPRESSION: 1. Interval increase in size of right cerebral hemorrhage, now measuring 6.0 x 4.1 x 4.2 cm (estimated volume 52 mL, previously 35 mL). Surrounding edema with partial effacement of the right lateral ventricle with progressive 4 mm of right-to-left shift. 2. Interval intraventricular extension with small volume hemorrhage within the right lateral ventricle. No hydrocephalus. Electronically Signed   By: Virgia Griffins M.D.   On: 12/16/2023 02:08   CT HEAD CODE STROKE WO CONTRAST Result Date: 12/16/2023 CLINICAL DATA:  Code stroke.  Acute neuro deficit, stroke. EXAM: CT HEAD WITHOUT CONTRAST CT ANGIOGRAPHY HEAD AND NECK TECHNIQUE: Multidetector CT imaging of the head and neck was performed using the standard protocol during bolus administration of intravenous contrast. Multiplanar CT image reconstructions and MIPs were obtained to evaluate the vascular anatomy. Carotid stenosis measurements (when applicable) are obtained utilizing NASCET criteria, using the distal internal carotid diameter as the denominator. RADIATION DOSE REDUCTION: This exam was performed according to the departmental dose-optimization program which includes automated exposure control, adjustment of the mA and/or kV according to patient size and/or use of iterative reconstruction technique. CONTRAST:  75mL OMNIPAQUE  IOHEXOL  350 MG/ML SOLN COMPARISON:  Study from 08/23/2012 FINDINGS: CT HEAD FINDINGS Brain: Acute intraparenchymal hemorrhage involving the right frontotemporal region measures 5.5 x 3.6 x 3.5 cm (estimated volume 35 mL).  Localized edema with partial effacement of the right lateral ventricle. No hydrocephalus. No other acute intracranial hemorrhage or large vessel territory infarct. No mass lesion or extra-axial fluid collection. Vascular: No abnormal hyperdense vessel. Skull: Scalp soft tissues and calvarium grossly within normal limits. Sinuses/Orbits: Globes orbital soft tissues demonstrate no acute finding. Remote posttraumatic defect at the left lamina papyracea. Paranasal sinuses are largely clear. No significant mastoid effusion. Other: None. ASPECTS [facility] Stroke Program Early CT Score) Acute ICH, does not apply. CTA NECK FINDINGS Aortic arch: Standard branching. Imaged portion shows no evidence of aneurysm or dissection. No significant stenosis of the major arch vessel origins. Right carotid system: No evidence of dissection, stenosis (50% or greater), or occlusion. Left carotid system: No evidence of dissection, stenosis (50% or greater), or occlusion. Vertebral arteries: No evidence of dissection, stenosis (50% or greater), or occlusion. Skeleton: No worrisome osseous lesions. Other neck: No other acute finding. Upper chest: No other acute finding. Review of the MIP images confirms the above findings CTA HEAD FINDINGS Anterior circulation: Both internal carotid arteries are widely patent through the siphons without stenosis. A1 segments, anterior communicating artery complex, and anterior cerebral arteries are widely patent. No M1 stenosis or occlusion. Distal MCA branches perfused and fairly symmetric. Posterior circulation:  Both V4 segments patent without stenosis. Right vertebral artery dominant. Both PICA patent. Basilar widely patent. Superior cerebellar and posterior cerebral arteries patent bilaterally. Venous sinuses: Patent allowing for timing the contrast bolus. Anatomic variants: None significant. No intracranial aneurysm. Two adjacent foci of contrast enhancement measuring up to 9 mm seen within the bed of  the right cerebral hematoma, consistent with active contrast extravasation/spot sign (series 6, image 336). No other underlying vascular abnormality. Review of the MIP images confirms the above findings IMPRESSION: CT HEAD: 5.5 x 3.6 x 3.5 cm (estimated volume 35 mL) acute intraparenchymal hemorrhage involving the right frontotemporal region. Localized edema without significant midline shift. CTA HEAD AND NECK: 1. Focus of contrast enhancement within the bed of the right cerebral hematoma, consistent with active contrast extravasation/spot sign. No other underlying vascular abnormality. 2. Otherwise negative CTA of the head and neck. No large vessel occlusion or other emergent finding. No hemodynamically significant or correctable stenosis. These results were communicated to Dr. Alecia Ames at 1:01 am on 12/16/2023 by text page via the [facility] messaging system. Electronically Signed   By: Virgia Griffins M.D.   On: 12/16/2023 01:40   CT ANGIO HEAD NECK W WO CM (CODE STROKE) Result Date: 12/16/2023 CLINICAL DATA:  Code stroke.  Acute neuro deficit, stroke. EXAM: CT HEAD WITHOUT CONTRAST CT ANGIOGRAPHY HEAD AND NECK TECHNIQUE: Multidetector CT imaging of the head and neck was performed using the standard protocol during bolus administration of intravenous contrast. Multiplanar CT image reconstructions and MIPs were obtained to evaluate the vascular anatomy. Carotid stenosis measurements (when applicable) are obtained utilizing NASCET criteria, using the distal internal carotid diameter as the denominator. RADIATION DOSE REDUCTION: This exam was performed according to the departmental dose-optimization program which includes automated exposure control, adjustment of the mA and/or kV according to patient size and/or use of iterative reconstruction technique. CONTRAST:  75mL OMNIPAQUE  IOHEXOL  350 MG/ML SOLN COMPARISON:  Study from 08/23/2012 FINDINGS: CT HEAD FINDINGS Brain: Acute intraparenchymal hemorrhage  involving the right frontotemporal region measures 5.5 x 3.6 x 3.5 cm (estimated volume 35 mL). Localized edema with partial effacement of the right lateral ventricle. No hydrocephalus. No other acute intracranial hemorrhage or large vessel territory infarct. No mass lesion or extra-axial fluid collection. Vascular: No abnormal hyperdense vessel. Skull: Scalp soft tissues and calvarium grossly within normal limits. Sinuses/Orbits: Globes orbital soft tissues demonstrate no acute finding. Remote posttraumatic defect at the left lamina papyracea. Paranasal sinuses are largely clear. No significant mastoid effusion. Other: None. ASPECTS [facility] Stroke Program Early CT Score) Acute ICH, does not apply. CTA NECK FINDINGS Aortic arch: Standard branching. Imaged portion shows no evidence of aneurysm or dissection. No significant stenosis of the major arch vessel origins. Right carotid system: No evidence of dissection, stenosis (50% or greater), or occlusion. Left carotid system: No evidence of dissection, stenosis (50% or greater), or occlusion. Vertebral arteries: No evidence of dissection, stenosis (50% or greater), or occlusion. Skeleton: No worrisome osseous lesions. Other neck: No other acute finding. Upper chest: No other acute finding. Review of the MIP images confirms the above findings CTA HEAD FINDINGS Anterior circulation: Both internal carotid arteries are widely patent through the siphons without stenosis. A1 segments, anterior communicating artery complex, and anterior cerebral arteries are widely patent. No M1 stenosis or occlusion. Distal MCA branches perfused and fairly symmetric. Posterior circulation: Both V4 segments patent without stenosis. Right vertebral artery dominant. Both PICA patent. Basilar widely patent. Superior cerebellar and posterior cerebral arteries patent bilaterally. Venous sinuses: Patent  allowing for timing the contrast bolus. Anatomic variants: None significant. No intracranial  aneurysm. Two adjacent foci of contrast enhancement measuring up to 9 mm seen within the bed of the right cerebral hematoma, consistent with active contrast extravasation/spot sign (series 6, image 336). No other underlying vascular abnormality. Review of the MIP images confirms the above findings IMPRESSION: CT HEAD: 5.5 x 3.6 x 3.5 cm (estimated volume 35 mL) acute intraparenchymal hemorrhage involving the right frontotemporal region. Localized edema without significant midline shift. CTA HEAD AND NECK: 1. Focus of contrast enhancement within the bed of the right cerebral hematoma, consistent with active contrast extravasation/spot sign. No other underlying vascular abnormality. 2. Otherwise negative CTA of the head and neck. No large vessel occlusion or other emergent finding. No hemodynamically significant or correctable stenosis. These results were communicated to Dr. Alecia Ames at 1:01 am on 12/16/2023 by text page via the [facility] messaging system. Electronically Signed   By: Virgia Griffins M.D.   On: 12/16/2023 01:40    Procedures Procedures  Cardiac monitor shows normal sinus rhythm, per my interpretation.  Medications Ordered in ED Medications  sodium chloride  flush (NS) 0.9 % injection 3 mL (has no administration in time range)  clevidipine (CLEVIPREX) infusion 0.5 mg/mL (has no administration in time range)    ED Course/ Medical Decision Making/ A&P                                 Medical Decision Making Amount and/or Complexity of Data Reviewed Labs: ordered. Radiology: ordered.  Risk Decision regarding hospitalization.   Patient presented as a code stroke.  He was taken for emergent CT of head which shows a large right hemisphere bleed.  CT angiogram has been ordered.  Official radiologist interpretation of CT of head is 5.5 x 3.6 x 3.5 cm intraparenchymal hemorrhage involving the right frontotemporal region with localized edema but no significant midline shift.  CTA of  head and neck shows focus of contrast-enhancement within the bed of the right cerebral hematoma consistent with active contrast extravasation/spot sign.  I have independently viewed the images, and agree with the radiologist's interpretation.  Dr. Alecia Ames of neurology service has seen the patient and is admitting him and has requested emergent neurosurgical consultation.  Dr. Cabbell is here evaluating the patient.  I have reviewed his laboratory tests, and my interpretation is leukopenia, thrombocytopenia, elevated transaminases all likely secondary to known history of alcohol abuse and cirrhosis, mild hypokalemia and elevated random glucose level not felt to be clinically significant, ethanol level significantly above the legal limit for intoxication.  CRITICAL CARE Performed by: Alissa April Total critical care time: 50 minutes Critical care time was exclusive of separately billable procedures and treating other patients. Critical care was necessary to treat or prevent imminent or life-threatening deterioration. Critical care was time spent personally by me on the following activities: development of treatment plan with patient and/or surrogate as well as nursing, discussions with consultants, evaluation of patient's response to treatment, examination of patient, obtaining history from patient or surrogate, ordering and performing treatments and interventions, ordering and review of laboratory studies, ordering and review of radiographic studies, pulse oximetry and re-evaluation of patient's condition.  Final Clinical Impression(s) / ED Diagnoses Final diagnoses:  Intracranial hemorrhage (HCC)  Thrombocytopenia (HCC)  Leukopenia, unspecified type  Elevated transaminase level  Hypokalemia  Elevated random blood glucose level  Alcohol intoxication, uncomplicated (HCC)    Rx / DC  Orders ED Discharge Orders     None         Alissa April, MD 12/16/23 [phone]

## 2023-12-16 NOTE — ED Notes (Addendum)
 UNMATCHED BLOOD PRODUCT NOTE  Compare the patient ID on the blood tag to the patient ID on the hospital armband and Blood Bank armband. Then confirm the unit number on the blood tag matches the unit number on the blood product.  If a discrepancy is discovered return the product to blood bank immediately.   Blood Product Type: Platelets  Unit #: (Found on blood product bag, begins with W) [id number]  Product Code #: (Found on blood product bag, begins with E) [id number]   Start Time: 0333  Starting Rate: 120 ml/hr  Rate increase/decreased  (if applicable):     200 ml/hr  Rate changed time (if applicable): 0351   Stop Time: 0444   All Other Documentation should be documented within the Blood Admin Flowsheet per policy.

## 2023-12-16 NOTE — Progress Notes (Addendum)
 STROKE TEAM PROGRESS NOTE    SIGNIFICANT HOSPITAL EVENTS 5/10: Patient admitted with large right sided subcortical hemorrhage, started on hypertonic saline, treatment for alcohol withdrawal started later in the day  INTERIM HISTORY/SUBJECTIVE  Patient continues to require Cleviprex to maintain blood pressure within parameters.  He has been restless and agitated but remains oriented to time and place.  Patient appears diaphoretic with nausea and tachycardia.  He reports that he drinks 2 or 3 drinks a day.  CCM consulted and treatment for alcohol withdrawal initiated.  OBJECTIVE  CBC    Component Value Date/Time   WBC 3.5 (L) 12/16/2023 0024   RBC 4.81 12/16/2023 0024   HGB 16.3 12/16/2023 0030   HCT 48.0 12/16/2023 0030   PLT 56 (L) 12/16/2023 0024   MCV 95.2 12/16/2023 0024   MCH 31.4 12/16/2023 0024   MCHC 33.0 12/16/2023 0024   RDW 14.9 12/16/2023 0024   LYMPHSABS 0.9 12/16/2023 0024   MONOABS 0.6 12/16/2023 0024   EOSABS 0.1 12/16/2023 0024   BASOSABS 0.0 12/16/2023 0024    BMET    Component Value Date/Time   NA 142 12/16/2023 1109   K 3.3 (L) 12/16/2023 0030   CL 103 12/16/2023 0030   CO2 23 12/16/2023 0024   GLUCOSE 106 (H) 12/16/2023 0030   BUN 4 (L) 12/16/2023 0030   CREATININE 1.00 12/16/2023 0030   CALCIUM 9.2 12/16/2023 0024   GFRNONAA >60 12/16/2023 0024    IMAGING past 24 hours CT HEAD WO CONTRAST ( ) Result Date: 12/16/2023 CLINICAL DATA:  Hemorrhagic stroke follow-up EXAM: CT HEAD WITHOUT CONTRAST TECHNIQUE: Contiguous axial images were obtained from the base of the skull through the vertex without intravenous contrast. RADIATION DOSE REDUCTION: This exam was performed according to the departmental dose-optimization program which includes automated exposure control, adjustment of the mA and/or kV according to patient size and/or use of iterative reconstruction technique. COMPARISON:  CTA from yesterday FINDINGS: Brain: Acute hematoma centered in the deep  right brain, especially to be attainment has increased in size from initial noncontrast head CT but is stable from most recent CTA, 5.7 x 4.7 x 4.7 cm. Little adjacent edema. Leftward midline shift measures 6 mm near the foramina [place]. No evidence of cortical infarct. Minimal intraventricular blood clot at the right lateral ventricle is unchanged. Vascular: No hyperdense vessel or unexpected calcification. Skull: Normal. Negative for fracture or focal lesion. Sinuses/Orbits: No acute finding IMPRESSION: Unchanged right cerebral hematoma when compared to CTA yesterday. 6 mm of midline shift. Electronically Signed   By: Ronnette Coke M.D.   On: 12/16/2023 08:34   CT HEAD CODE STROKE WO CONTRAST Result Date: 12/16/2023 CLINICAL DATA:  Code stroke. Follow-up examination for acute neuro deficit, stroke. EXAM: CT HEAD WITHOUT CONTRAST TECHNIQUE: Contiguous axial images were obtained from the base of the skull through the vertex without intravenous contrast. RADIATION DOSE REDUCTION: This exam was performed according to the departmental dose-optimization program which includes automated exposure control, adjustment of the mA and/or kV according to patient size and/or use of iterative reconstruction technique. COMPARISON:  CT from earlier the same day. FINDINGS: Brain: Previously identified right cerebral hemorrhage again seen, now measuring 6.0 x 4.1 x 4.2 cm (estimated volume 52 mL, previously 35 mL). Surrounding edema with partial effacement of the right lateral ventricle. Associated 4 mm of right-to-left shift is now seen. Additionally, now seen is small volume hemorrhage within the right lateral ventricle, consistent with intraventricular extension. No other acute intracranial hemorrhage. No other acute large  vessel territory infarct. No mass lesion. No extra-axial fluid collection. Vascular: Contrast material from prior CTA present within the intracranial circulation. Skull: No new finding. Sinuses/Orbits: No  new finding. Other: None. IMPRESSION: 1. Interval increase in size of right cerebral hemorrhage, now measuring 6.0 x 4.1 x 4.2 cm (estimated volume 52 mL, previously 35 mL). Surrounding edema with partial effacement of the right lateral ventricle with progressive 4 mm of right-to-left shift. 2. Interval intraventricular extension with small volume hemorrhage within the right lateral ventricle. No hydrocephalus. Electronically Signed   By: Virgia Griffins M.D.   On: 12/16/2023 02:08   CT HEAD CODE STROKE WO CONTRAST Result Date: 12/16/2023 CLINICAL DATA:  Code stroke.  Acute neuro deficit, stroke. EXAM: CT HEAD WITHOUT CONTRAST CT ANGIOGRAPHY HEAD AND NECK TECHNIQUE: Multidetector CT imaging of the head and neck was performed using the standard protocol during bolus administration of intravenous contrast. Multiplanar CT image reconstructions and MIPs were obtained to evaluate the vascular anatomy. Carotid stenosis measurements (when applicable) are obtained utilizing NASCET criteria, using the distal internal carotid diameter as the denominator. RADIATION DOSE REDUCTION: This exam was performed according to the departmental dose-optimization program which includes automated exposure control, adjustment of the mA and/or kV according to patient size and/or use of iterative reconstruction technique. CONTRAST:  75mL OMNIPAQUE  IOHEXOL  350 MG/ML SOLN COMPARISON:  Study from 08/23/2012 FINDINGS: CT HEAD FINDINGS Brain: Acute intraparenchymal hemorrhage involving the right frontotemporal region measures 5.5 x 3.6 x 3.5 cm (estimated volume 35 mL). Localized edema with partial effacement of the right lateral ventricle. No hydrocephalus. No other acute intracranial hemorrhage or large vessel territory infarct. No mass lesion or extra-axial fluid collection. Vascular: No abnormal hyperdense vessel. Skull: Scalp soft tissues and calvarium grossly within normal limits. Sinuses/Orbits: Globes orbital soft tissues  demonstrate no acute finding. Remote posttraumatic defect at the left lamina papyracea. Paranasal sinuses are largely clear. No significant mastoid effusion. Other: None. ASPECTS [facility] Stroke Program Early CT Score) Acute ICH, does not apply. CTA NECK FINDINGS Aortic arch: Standard branching. Imaged portion shows no evidence of aneurysm or dissection. No significant stenosis of the major arch vessel origins. Right carotid system: No evidence of dissection, stenosis (50% or greater), or occlusion. Left carotid system: No evidence of dissection, stenosis (50% or greater), or occlusion. Vertebral arteries: No evidence of dissection, stenosis (50% or greater), or occlusion. Skeleton: No worrisome osseous lesions. Other neck: No other acute finding. Upper chest: No other acute finding. Review of the MIP images confirms the above findings CTA HEAD FINDINGS Anterior circulation: Both internal carotid arteries are widely patent through the siphons without stenosis. A1 segments, anterior communicating artery complex, and anterior cerebral arteries are widely patent. No M1 stenosis or occlusion. Distal MCA branches perfused and fairly symmetric. Posterior circulation: Both V4 segments patent without stenosis. Right vertebral artery dominant. Both PICA patent. Basilar widely patent. Superior cerebellar and posterior cerebral arteries patent bilaterally. Venous sinuses: Patent allowing for timing the contrast bolus. Anatomic variants: None significant. No intracranial aneurysm. Two adjacent foci of contrast enhancement measuring up to 9 mm seen within the bed of the right cerebral hematoma, consistent with active contrast extravasation/spot sign (series 6, image 336). No other underlying vascular abnormality. Review of the MIP images confirms the above findings IMPRESSION: CT HEAD: 5.5 x 3.6 x 3.5 cm (estimated volume 35 mL) acute intraparenchymal hemorrhage involving the right frontotemporal region. Localized edema without  significant midline shift. CTA HEAD AND NECK: 1. Focus of contrast enhancement within the bed  of the right cerebral hematoma, consistent with active contrast extravasation/spot sign. No other underlying vascular abnormality. 2. Otherwise negative CTA of the head and neck. No large vessel occlusion or other emergent finding. No hemodynamically significant or correctable stenosis. These results were communicated to Dr. Alecia Ames at 1:01 am on 12/16/2023 by text page via the [facility] messaging system. Electronically Signed   By: Virgia Griffins M.D.   On: 12/16/2023 01:40   CT ANGIO HEAD NECK W WO CM (CODE STROKE) Result Date: 12/16/2023 CLINICAL DATA:  Code stroke.  Acute neuro deficit, stroke. EXAM: CT HEAD WITHOUT CONTRAST CT ANGIOGRAPHY HEAD AND NECK TECHNIQUE: Multidetector CT imaging of the head and neck was performed using the standard protocol during bolus administration of intravenous contrast. Multiplanar CT image reconstructions and MIPs were obtained to evaluate the vascular anatomy. Carotid stenosis measurements (when applicable) are obtained utilizing NASCET criteria, using the distal internal carotid diameter as the denominator. RADIATION DOSE REDUCTION: This exam was performed according to the departmental dose-optimization program which includes automated exposure control, adjustment of the mA and/or kV according to patient size and/or use of iterative reconstruction technique. CONTRAST:  75mL OMNIPAQUE  IOHEXOL  350 MG/ML SOLN COMPARISON:  Study from 08/23/2012 FINDINGS: CT HEAD FINDINGS Brain: Acute intraparenchymal hemorrhage involving the right frontotemporal region measures 5.5 x 3.6 x 3.5 cm (estimated volume 35 mL). Localized edema with partial effacement of the right lateral ventricle. No hydrocephalus. No other acute intracranial hemorrhage or large vessel territory infarct. No mass lesion or extra-axial fluid collection. Vascular: No abnormal hyperdense vessel. Skull: Scalp soft tissues  and calvarium grossly within normal limits. Sinuses/Orbits: Globes orbital soft tissues demonstrate no acute finding. Remote posttraumatic defect at the left lamina papyracea. Paranasal sinuses are largely clear. No significant mastoid effusion. Other: None. ASPECTS [facility] Stroke Program Early CT Score) Acute ICH, does not apply. CTA NECK FINDINGS Aortic arch: Standard branching. Imaged portion shows no evidence of aneurysm or dissection. No significant stenosis of the major arch vessel origins. Right carotid system: No evidence of dissection, stenosis (50% or greater), or occlusion. Left carotid system: No evidence of dissection, stenosis (50% or greater), or occlusion. Vertebral arteries: No evidence of dissection, stenosis (50% or greater), or occlusion. Skeleton: No worrisome osseous lesions. Other neck: No other acute finding. Upper chest: No other acute finding. Review of the MIP images confirms the above findings CTA HEAD FINDINGS Anterior circulation: Both internal carotid arteries are widely patent through the siphons without stenosis. A1 segments, anterior communicating artery complex, and anterior cerebral arteries are widely patent. No M1 stenosis or occlusion. Distal MCA branches perfused and fairly symmetric. Posterior circulation: Both V4 segments patent without stenosis. Right vertebral artery dominant. Both PICA patent. Basilar widely patent. Superior cerebellar and posterior cerebral arteries patent bilaterally. Venous sinuses: Patent allowing for timing the contrast bolus. Anatomic variants: None significant. No intracranial aneurysm. Two adjacent foci of contrast enhancement measuring up to 9 mm seen within the bed of the right cerebral hematoma, consistent with active contrast extravasation/spot sign (series 6, image 336). No other underlying vascular abnormality. Review of the MIP images confirms the above findings IMPRESSION: CT HEAD: 5.5 x 3.6 x 3.5 cm (estimated volume 35 mL) acute  intraparenchymal hemorrhage involving the right frontotemporal region. Localized edema without significant midline shift. CTA HEAD AND NECK: 1. Focus of contrast enhancement within the bed of the right cerebral hematoma, consistent with active contrast extravasation/spot sign. No other underlying vascular abnormality. 2. Otherwise negative CTA of the head and neck. No large  vessel occlusion or other emergent finding. No hemodynamically significant or correctable stenosis. These results were communicated to Dr. Alecia Ames at 1:01 am on 12/16/2023 by text page via the [facility] messaging system. Electronically Signed   By: Virgia Griffins M.D.   On: 12/16/2023 01:40    Vitals:   12/16/23 1100 12/16/23 1200 12/16/23 1300 12/16/23 1400  BP: (!) 136/91 (!) 142/99 (!) 141/97 (!) 148/100  Pulse: (!) 116 (!) 140 (!) 116 (!) 120  Resp: (!) 25 20 19  (!) 24  Temp:  (!) 101 F (38.3 C)    TempSrc:  Axillary    SpO2: 98% 95% 95% 96%  Weight:         PHYSICAL EXAM General: Ill-appearing, restless patient Psych: Restlessness and agitation noted CV: Regular, tachycardic rate and rhythm on monitor Respiratory:  Regular, unlabored respirations on room air  NEURO:  Mental Status: AA&Ox3, patient is able to answer questions but unable to give a good history of his present illness Speech/Language: speech is with some dysarthria.  Naming intact  Cranial Nerves:  II: PERRL. Visual fields full.  III, IV, VI: EOMI. Eyelids elevate symmetrically.  V: Sensation is intact to light touch and symmetrical to face.  VII: Left facial droop VIII: hearing intact to voice. IX, X: Voice is dysarthric XII: tongue is midline without fasciculations. Motor: Able to move right upper and lower extremity with good antigravity strength, able to move fingers of the left hand but cannot lift it off the bed, no movement of left lower extremity Tone: is normal and bulk is normal Sensation- Intact to light touch  bilaterally. Coordination: Unable to perform Gait- deferred  Most Recent NIH  1a Level of Conscious.: 0 1b LOC Questions: 0 1c LOC Commands: 0 2 Best Gaze: 0 3 Visual: 0 4 Facial Palsy: 1 5a Motor Arm - left: 3 5b Motor Arm - Right: 0 6a Motor Leg - Left: 4 6b Motor Leg - Right: 0 7 Limb Ataxia: 0 8 Sensory: 0 9 Best Language: 0 10 Dysarthria: 1 11 Extinct. and Inatten.:  TOTAL: 9   ASSESSMENT/PLAN  Troy Bishop is a 33 y.o. male with history of hypertension, seizure and daily alcohol use admitted for acute onset left-sided weakness.  He was found to have a large right subcortical ICH.  On later CT angiogram, focus of contrast-enhancement within cerebral hematoma was found, and repeat CT demonstrated enlargement of ICH.  He received platelet transfusion for platelet count of 54.  However, follow-up CT in the morning of 5/10 revealed stable ICH.  Neurosurgery was consulted but has declined to intervene.  Cleviprex was used for blood pressure control, and hypertonic saline was initiated to decrease edema.  In the morning of 5/10, patient became restless, agitated, diaphoretic and developed nausea.  He reports that he has 2-3 drinks of alcohol daily, and phenobarbital taper along with Precedex drip were initiated to treat alcohol withdrawal.  NIH on Admission 18, ICH score 1  Intracerebral Hemorrhage:  right subcortical ICH Etiology: Likely hypertensive Code Stroke CT head 35 mm ICH in right frontotemporal region Repeat CT head interval increase in size of right cerebral hemorrhage, now estimated volume 52 mL CTA head & neck focus of contrast enhancement within bed of right cerebral hematoma, no other underlying vascular abnormality Repeat CT head 5/10 unchanged right cerebral hematoma with 6 mm of midline shift MRI pending 2D Echo pending LDL pending HgbA1c pending VTE prophylaxis -SCDs No antithrombotic prior to admission, now on No antithrombotic secondary  to ICH Therapy  recommendations:  Pending Disposition: Pending  Cerebral edema (brain compression) Large right ICH with 6 mm midline shift noted on CT 5/10 Continue 3% hypertonic saline at 75 cc/h Monitor sodium every 6 hours, last 142 Repeat head CT 5/11  Hypertension Home meds: None Unstable, requiring Cleviprex Blood Pressure Goal: SBP between 130-150 for 24 hours and then less than 160   Hyperlipidemia Home meds: None LDL pending, goal < 70 Add statin at discharge if LDL greater than goal  Risk for diabetes type II  Home meds: None HgbA1c pending, goal < 7.0  Alcohol withdrawal Patient states he drinks 2-3 drinks per day EtOH level on admission 264 Patient noted to be tachycardic, diaphoretic, agitated, restless with nausea and vomiting on 5/10 CCM consulted and phenobarbital taper initiated for EtOH withdrawal Patient requiring IV Precedex for withdrawal related agitation  Dysphagia Patient has post-stroke dysphagia, SLP consulted    Diet   Diet Heart Room service appropriate? Yes with Assist; Fluid consistency: Thin   Advance diet as tolerated  Other Stroke Risk Factors ETOH use, alcohol level 264, will advise cessation when patient more oriented    Other Active Problems None  Hospital day # 0  Patient seen by NP and then by MD, MD to edit note as needed. Cortney E Bucky Cardinal , MSN, AGACNP-BC [facility] See Amion for schedule and pager information 12/16/2023 4:02 PM    ATTENDING ATTESTATION:  33 year old with large right subcortical hematoma.  He had some expansion repeat head CT.  Discussed with neurosurgery no plans for intervention.  Sodium is 142 and not at goal but given his clinical stable exam we will continue current hypertonic rate.  Started CIWA protocol and CCM consulted.  Ativan  will be useful given he is actively undergoing withdrawal from alcohol he is diaphoretic on exam and agitated.  Will continue to monitor with serial head CT.  No  antithrombotics.  Monitor platelets.  Continue Cleviprex for now.  Dr. Dahlia Dross evaluated pt independently, reviewed imaging, chart, labs. Discussed and formulated plan with the Resident/APP. Changes were made to the note where appropriate. Please see APP/resident note above for details.      This patient is critically ill due to respiratory distress, hemorrhagic stroke with possibility of worsening edema and alcohol withdrawal on CIWA protocol and at significant risk of neurological worsening, death form heart failure, respiratory failure, recurrent stroke, bleeding from [facility], seizure, sepsis. This patient's care requires constant monitoring of vital signs, hemodynamics, respiratory and cardiac monitoring, review of multiple databases, neurological assessment, discussion with family, other specialists and medical decision making of high complexity. I spent 35 minutes of neurocritical care time in the care of this patient.   Vance Hochmuth,MD   To contact Stroke Continuity provider, please refer to [email or web address]. After hours, contact General Neurology

## 2023-12-17 ENCOUNTER — Other Ambulatory Visit (HOSPITAL_COMMUNITY)

## 2023-12-17 ENCOUNTER — Inpatient Hospital Stay (HOSPITAL_COMMUNITY)

## 2023-12-17 DIAGNOSIS — E785 Hyperlipidemia, unspecified: Secondary | ICD-10-CM | POA: Diagnosis not present

## 2023-12-17 DIAGNOSIS — R509 Fever, unspecified: Secondary | ICD-10-CM

## 2023-12-17 DIAGNOSIS — I10 Essential (primary) hypertension: Secondary | ICD-10-CM | POA: Diagnosis not present

## 2023-12-17 DIAGNOSIS — I629 Nontraumatic intracranial hemorrhage, unspecified: Secondary | ICD-10-CM | POA: Diagnosis not present

## 2023-12-17 DIAGNOSIS — K7030 Alcoholic cirrhosis of liver without ascites: Secondary | ICD-10-CM | POA: Diagnosis not present

## 2023-12-17 DIAGNOSIS — R29709 NIHSS score 9: Secondary | ICD-10-CM | POA: Diagnosis not present

## 2023-12-17 DIAGNOSIS — G936 Cerebral edema: Secondary | ICD-10-CM | POA: Diagnosis not present

## 2023-12-17 DIAGNOSIS — I610 Nontraumatic intracerebral hemorrhage in hemisphere, subcortical: Secondary | ICD-10-CM | POA: Diagnosis not present

## 2023-12-17 DIAGNOSIS — G935 Compression of brain: Secondary | ICD-10-CM | POA: Diagnosis not present

## 2023-12-17 LAB — PREPARE PLATELET PHERESIS
Unit division: 0
Unit division: 0
Unit division: 0

## 2023-12-17 LAB — COMPREHENSIVE METABOLIC PANEL WITH GFR
ALT: 144 U/L — ABNORMAL HIGH (ref 0–44)
AST: 146 U/L — ABNORMAL HIGH (ref 15–41)
Albumin: 3.4 g/dL — ABNORMAL LOW (ref 3.5–5.0)
Alkaline Phosphatase: 73 U/L (ref 38–126)
Anion gap: 11 (ref 5–15)
BUN: 6 mg/dL (ref 6–20)
CO2: 22 mmol/L (ref 22–32)
Calcium: 7.9 mg/dL — ABNORMAL LOW (ref 8.9–10.3)
Chloride: 105 mmol/L (ref 98–111)
Creatinine, Ser: 0.81 mg/dL (ref 0.61–1.24)
GFR, Estimated: >60 mL/min (ref >60)
Glucose, Bld: 114 mg/dL — ABNORMAL HIGH (ref 70–99)
Potassium: 3.2 mmol/L — ABNORMAL LOW (ref 3.5–5.1)
Sodium: 138 mmol/L (ref 135–145)
Total Bilirubin: 1.5 mg/dL — ABNORMAL HIGH (ref 0.0–1.2)
Total Protein: 7.3 g/dL (ref 6.5–8.1)

## 2023-12-17 LAB — CBC
HCT: 41.2 % (ref 39.0–52.0)
Hemoglobin: 14 g/dL (ref 13.0–17.0)
MCH: 31.8 pg (ref 26.0–34.0)
MCHC: 34 g/dL (ref 30.0–36.0)
MCV: 93.6 fL (ref 80.0–100.0)
Platelets: 60 10*3/uL — ABNORMAL LOW (ref 150–400)
RBC: 4.4 MIL/uL (ref 4.22–5.81)
RDW: 14.4 % (ref 11.5–15.5)
WBC: 5.5 10*3/uL (ref 4.0–10.5)
nRBC: 0 % (ref 0.0–0.2)

## 2023-12-17 LAB — BPAM PLATELET PHERESIS
Blood Product Expiration Date: 202505112359
Blood Product Expiration Date: 202505122359
Blood Product Expiration Date: 202505122359
ISSUE DATE / TIME: 202505100246
ISSUE DATE / TIME: 202505100246
ISSUE DATE / TIME: 202505100625
Unit Type and Rh: 5100
Unit Type and Rh: 6200
Unit Type and Rh: 6200

## 2023-12-17 LAB — URINALYSIS, ROUTINE W REFLEX MICROSCOPIC
Bilirubin Urine: NEGATIVE
Glucose, UA: NEGATIVE mg/dL
Hgb urine dipstick: NEGATIVE
Ketones, ur: 5 mg/dL — AB
Leukocytes,Ua: NEGATIVE
Nitrite: NEGATIVE
Protein, ur: 30 mg/dL — AB
Specific Gravity, Urine: 1.012 (ref 1.005–1.030)
pH: 7 (ref 5.0–8.0)

## 2023-12-17 LAB — BASIC METABOLIC PANEL WITH GFR
Anion gap: 13 (ref 5–15)
BUN: 5 mg/dL — ABNORMAL LOW (ref 6–20)
CO2: 22 mmol/L (ref 22–32)
Calcium: 8.7 mg/dL — ABNORMAL LOW (ref 8.9–10.3)
Chloride: 97 mmol/L — ABNORMAL LOW (ref 98–111)
Creatinine, Ser: 0.75 mg/dL (ref 0.61–1.24)
GFR, Estimated: >60 mL/min (ref >60)
Glucose, Bld: 111 mg/dL — ABNORMAL HIGH (ref 70–99)
Potassium: 3.2 mmol/L — ABNORMAL LOW (ref 3.5–5.1)
Sodium: 132 mmol/L — ABNORMAL LOW (ref 135–145)

## 2023-12-17 LAB — LIPID PANEL
Cholesterol: 247 mg/dL — ABNORMAL HIGH (ref 0–200)
HDL: 78 mg/dL (ref >40)
LDL Cholesterol: 124 mg/dL — ABNORMAL HIGH (ref 0–99)
Total CHOL/HDL Ratio: 3.2 ratio
Triglycerides: 224 mg/dL — ABNORMAL HIGH (ref <150)
VLDL: 45 mg/dL — ABNORMAL HIGH (ref 0–40)

## 2023-12-17 MED ORDER — POTASSIUM CHLORIDE 10 MEQ/100ML IV SOLN
10.0000 meq | INTRAVENOUS | Status: AC
  Administered 2023-12-17 – 2023-12-18 (×6): 10 meq via INTRAVENOUS
  Filled 2023-12-17 (×6): qty 100

## 2023-12-17 MED ORDER — LABETALOL HCL 5 MG/ML IV SOLN
10.0000 mg | INTRAVENOUS | Status: DC | PRN
  Administered 2023-12-17 – 2023-12-18 (×4): 10 mg via INTRAVENOUS
  Filled 2023-12-17 (×3): qty 4

## 2023-12-17 MED ORDER — LORAZEPAM 2 MG/ML IJ SOLN
INTRAMUSCULAR | Status: AC
  Filled 2023-12-17: qty 1

## 2023-12-17 MED ORDER — PANTOPRAZOLE SODIUM 40 MG PO TBEC
40.0000 mg | DELAYED_RELEASE_TABLET | Freq: Every day | ORAL | Status: DC
  Administered 2023-12-18 – 2023-12-21 (×4): 40 mg via ORAL
  Filled 2023-12-17 (×4): qty 1

## 2023-12-17 MED ORDER — SODIUM CHLORIDE 23.4 % INJECTION (4 MEQ/ML) FOR IV ADMINISTRATION
120.0000 meq | Freq: Once | INTRAVENOUS | Status: AC
  Administered 2023-12-17: 120 meq via INTRAVENOUS
  Filled 2023-12-17: qty 30

## 2023-12-17 MED ORDER — LACTATED RINGERS IV BOLUS
1000.0000 mL | Freq: Once | INTRAVENOUS | Status: AC
  Administered 2023-12-17: 1000 mL via INTRAVENOUS

## 2023-12-17 MED ORDER — POTASSIUM CHLORIDE 10 MEQ/100ML IV SOLN
10.0000 meq | INTRAVENOUS | Status: AC
  Administered 2023-12-17 (×5): 10 meq via INTRAVENOUS
  Filled 2023-12-17 (×5): qty 100

## 2023-12-17 MED ORDER — HYDRALAZINE HCL 20 MG/ML IJ SOLN
10.0000 mg | INTRAMUSCULAR | Status: DC | PRN
  Administered 2023-12-17 – 2023-12-19 (×3): 10 mg via INTRAVENOUS
  Filled 2023-12-17 (×3): qty 1

## 2023-12-17 MED ORDER — NICOTINE 21 MG/24HR TD PT24
21.0000 mg | MEDICATED_PATCH | Freq: Every day | TRANSDERMAL | Status: DC
  Administered 2023-12-17 – 2023-12-30 (×14): 21 mg via TRANSDERMAL
  Filled 2023-12-17 (×14): qty 1

## 2023-12-17 MED ORDER — LORAZEPAM 2 MG/ML IJ SOLN
INTRAMUSCULAR | Status: AC
  Administered 2023-12-17: 2 mg via INTRAVENOUS
  Filled 2023-12-17: qty 1

## 2023-12-17 MED ORDER — ONDANSETRON HCL 4 MG/2ML IJ SOLN: 4.0000 mg | Freq: Once | INTRAMUSCULAR | Status: DC

## 2023-12-17 MED ORDER — LORAZEPAM 2 MG/ML IJ SOLN
INTRAMUSCULAR | Status: AC
Start: 2023-12-17 — End: 2023-12-18
  Filled 2023-12-17: qty 2

## 2023-12-17 MED ORDER — ONDANSETRON HCL 4 MG/2ML IJ SOLN: 4.0000 mg | Freq: Four times a day (QID) | INTRAMUSCULAR | Status: DC

## 2023-12-17 MED ORDER — ACETAMINOPHEN 10 MG/ML IV SOLN
1000.0000 mg | Freq: Once | INTRAVENOUS | Status: AC
  Administered 2023-12-18: 1000 mg via INTRAVENOUS
  Filled 2023-12-17: qty 100

## 2023-12-17 MED ORDER — AMLODIPINE BESYLATE 10 MG PO TABS
10.0000 mg | ORAL_TABLET | Freq: Every day | ORAL | Status: DC
  Administered 2023-12-17 – 2023-12-21 (×5): 10 mg via ORAL
  Filled 2023-12-17 (×5): qty 1

## 2023-12-17 MED ORDER — LORAZEPAM 2 MG/ML IJ SOLN
2.0000 mg | Freq: Once | INTRAMUSCULAR | Status: AC
Start: 2023-12-17 — End: 2023-12-17

## 2023-12-17 MED ORDER — SODIUM CHLORIDE 3 % IV SOLN
INTRAVENOUS | Status: DC
  Filled 2023-12-17 (×14): qty 500

## 2023-12-17 MED ORDER — ONDANSETRON HCL 4 MG/2ML IJ SOLN
4.0000 mg | Freq: Four times a day (QID) | INTRAMUSCULAR | Status: DC | PRN
  Administered 2023-12-17: 4 mg via INTRAVENOUS
  Filled 2023-12-17: qty 2

## 2023-12-17 MED ORDER — LORAZEPAM 2 MG/ML IJ SOLN
2.0000 mg | Freq: Once | INTRAMUSCULAR | Status: AC
  Administered 2023-12-17: 2 mg via INTRAVENOUS

## 2023-12-17 MED ORDER — LORAZEPAM 2 MG/ML IJ SOLN: 2.0000 mg | Freq: Once | INTRAMUSCULAR | Status: DC

## 2023-12-17 MED ORDER — SODIUM CHLORIDE 0.9 % IV SOLN
12.5000 mg | Freq: Once | INTRAVENOUS | Status: AC
  Administered 2023-12-17: 12.5 mg via INTRAVENOUS
  Filled 2023-12-17: qty 12.5

## 2023-12-17 NOTE — Progress Notes (Signed)
 NAMECarlos Bishop, MRN:  [id number], DOB:  12/09/1990, LOS: 1 ADMISSION DATE:  12/16/2023, CONSULTATION DATE:  12/16/2023 REFERRING MD: Stroke - MD, CHIEF COMPLAINT: ICH with evolving EtOH withdrawal  History of Present Illness:  Troy Bishop is a 33 year old male with past medical history significant for EtOH use and liver disease who scented to the ED at [facility] 5/9 after syncopal episode with left-sided weakness.  Presented as code stroke.  Code stroke CT revealed a large right IPH with no significant midline shift.  CTA head and neck concerning for contrast extravasation/spots on for which neurosurgery was consulted.  Patient was admitted to neuro ICU for close monitoring under the care of neurology and neurosurgery.  Mid morning of 5/10 patient became recently agitated with tachycardia additional medical history was obtained and patient's family reports patient drinks alcohol daily.  Concern for evolving alcohol withdrawal for which PCCM was consulted.  Pertinent  Medical History  Liver disease Alcohol use  Significant Hospital Events: Including procedures, antibiotic start and stop dates in addition to other pertinent events   5/9 presented after syncopal episode with left-sided weakness noted, code stroke CT confirmed IPH.  CTA head and neck concern for contrast dissociation/, neurosurgery consulted with plans to closely observe  5/10 neurologically remains able with repeat CT unchanged with right cerebral hematoma with 6 mm midline shift.  Concerns for evolving EtOH withdrawal 5/11 waxing and waning agitation/withdrawal symptoms  Interim History / Subjective:  Restless this a.m. after having incontinence of bowel  Objective    Blood pressure 134/79, pulse 83, temperature 99 F (37.2 C), temperature source Axillary, resp. rate 17, weight 63.4 kg, SpO2 98%.        Intake/Output Summary (Last 24 hours) at 12/17/2023 0752 Last data filed at 12/17/2023 0700 Gross per 24 hour  Intake  2966.11 ml  Output 3450 ml  Net -483.89 ml   Filed Weights   12/16/23 0000  Weight: 63.4 kg    Examination: General: Acute ill-appearing adult encephalopathic male lying in bed in no acute distress HEENT: [place]/AT, MM pink/moist, PERRL,  Neuro: Alert but disoriented, nonfocal CV: s1s2 regular rate and rhythm, no murmur, rubs, or gallops,  PULM: Clear to auscultation bilaterally, no increased work of breathing, no added breath sounds, on room air GI: soft, bowel sounds active in all 4 quadrants, non-tender, non-distended Extremities: warm/dry, no edema  Skin: no rashes or lesions  Resolved Hospital Problem list     Assessment & Plan:  Nontraumatic intracerebral hematoma with brain compression - Repeat head CT early a.m. 5/10 with unchanged right cerebral hematoma with 6 mm midline shift P: Neurology and neurosurgery following, appreciate assistance Frequent neurochecks No antiplatelets or anticoagulants currently Neuroprotective measures SBP goal 130-150 Secondary stroke PT/OT/SLP as able  At risk for airway compromise - The setting of large ICH and evolving alcohol withdrawal requiring benzodiazepines/sedating medications P: Continue close monitoring of airway in the ICU Aspiration precautions N.p.o. 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 with elevated LFTs -Cirrhosis seen on ultrasound 8/24 Daily alcohol consumption P: Avoid hepatotoxins CIWA protocol Low-dose phenobarbital taper Precedex drip, wean as able Alcohol cessation education when appropriate  Leukopenia Thrombocytopenia -Presumed secondary to large-volume ongoing alcohol use P: Trend CBC Monitor for signs of transfuse per protocol Hemoglobin goal greater than 7 Platelet goal greater than 20  Hypokalemia P: Supplement as needed    Best Practice (right click  and "Reselect all  SmartList Selections" daily)  Per primary   Critical care time:   CRITICAL CARE Performed by: Quita Mcgrory D. Harris   Total critical care time: 38 minutes  Critical care time was exclusive of separately billable procedures and treating other patients.  Critical care was necessary to treat or prevent imminent or life-threatening deterioration.  Critical care was time spent personally by me on the following activities: development of treatment plan with patient and/or surrogate as well as nursing, discussions with consultants, evaluation of patient's response to treatment, examination of patient, obtaining history from patient or surrogate, ordering and performing treatments and interventions, ordering and review of laboratory studies, ordering and review of radiographic studies, pulse oximetry and re-evaluation of patient's condition.  Dominique Ressel D. Harris, NP-C [facility] Personal contact information can be found on Amion  If no contact or response made please call [phone] 12/17/2023, 7:52 AM

## 2023-12-17 NOTE — Progress Notes (Signed)
 PCCM Progress Note   Notified of ongoing vomiting this afternoon despite PRN antiemetics. This is now coupled with complaints of a headache and "eye pain" which appear to be new complaints, there is a language barrier that makes collecting exam questions difficult. Decision was made to precede with repeat head CT orders written.   Luiscarlos Kaczmarczyk D. Harris, NP-C  Pulmonary & Critical Care Personal contact information can be found on Amion  If no contact or response made please call [phone] 12/17/2023, 5:35 PM

## 2023-12-17 NOTE — Progress Notes (Signed)
 PT Cancellation Note  Patient Details Name: Troy Bishop MRN: [id number] DOB: 07-04-1991   Cancelled Treatment:    Reason Eval/Treat Not Completed: Medical issues which prohibited therapy this afternoon. I checked in with RN who states pt was just given dose of ativan  and has been having significant vomiting this afternoon. Asked PT to return for evaluation tomorrow.   Barnabas Booth, PT, DPT   Acute Rehabilitation Department Office [phone] Secure Chat Communication Preferred   [name] 12/17/2023, 5:13 PM

## 2023-12-17 NOTE — Plan of Care (Signed)
  Problem: Education: Goal: Knowledge of disease or condition will improve Outcome: Progressing Goal: Knowledge of secondary prevention will improve (MUST DOCUMENT ALL) Outcome: Progressing Goal: Knowledge of patient specific risk factors will improve (DELETE if not current risk factor) Outcome: Progressing   Problem: Intracerebral Hemorrhage Tissue Perfusion: Goal: Complications of Intracerebral Hemorrhage will be minimized Outcome: Progressing   Problem: Coping: Goal: Will verbalize positive feelings about self Outcome: Progressing Goal: Will identify appropriate support needs Outcome: Progressing   Problem: Health Behavior/Discharge Planning: Goal: Ability to manage health-related needs will improve Outcome: Progressing Goal: Goals will be collaboratively established with patient/family Outcome: Progressing   Problem: Self-Care: Goal: Ability to participate in self-care as condition permits will improve Outcome: Progressing Goal: Verbalization of feelings and concerns over difficulty with self-care will improve Outcome: Progressing Goal: Ability to communicate needs accurately will improve Outcome: Progressing   Problem: Nutrition: Goal: Risk of aspiration will decrease Outcome: Progressing Goal: Dietary intake will improve Outcome: Progressing   Problem: Education: Goal: Knowledge of General Education information will improve Description: Including pain rating scale, medication(s)/side effects and non-pharmacologic comfort measures Outcome: Progressing   Problem: Health Behavior/Discharge Planning: Goal: Ability to manage health-related needs will improve Outcome: Progressing   Problem: Clinical Measurements: Goal: Ability to maintain clinical measurements within normal limits will improve Outcome: Progressing Goal: Will remain free from infection Outcome: Progressing Goal: Diagnostic test results will improve Outcome: Progressing Goal: Respiratory  complications will improve Outcome: Progressing Goal: Cardiovascular complication will be avoided Outcome: Progressing   Problem: Activity: Goal: Risk for activity intolerance will decrease Outcome: Progressing   Problem: Nutrition: Goal: Adequate nutrition will be maintained Outcome: Progressing   Problem: Coping: Goal: Level of anxiety will decrease Outcome: Progressing   Problem: Elimination: Goal: Will not experience complications related to bowel motility Outcome: Progressing Goal: Will not experience complications related to urinary retention Outcome: Progressing   Problem: Pain Managment: Goal: General experience of comfort will improve and/or be controlled Outcome: Progressing   Problem: Safety: Goal: Ability to remain free from injury will improve Outcome: Progressing   Problem: Skin Integrity: Goal: Risk for impaired skin integrity will decrease Outcome: Progressing   Problem: Safety: Goal: Non-violent Restraint(s) Outcome: Progressing

## 2023-12-17 NOTE — Progress Notes (Signed)
 This RN notified stroke team Dahlia Dross, MD; Bucky Cardinal, NP) and Laurina Popper NP (CCM) regarding persistent vomiting and diarrhea. From 1300-1600 pt has vomited 5x. Diarrhea episodes 2x. IV Zofran  not effective in relieving N/V. ECG obtained, IV phenergan  and additional dose of zofran  ordered. KUB and repeat head CT obtained. One-time dose Ativan  2 mg given and precedex gtt restarted in CT. 1 L LR bolus given for low BP.   Pt neuro exam remains unchanged. Pt is A+O x4, but gets intermittently restless, agitated, and impulsive. Pt c/o headache, which is consistent w prior assessments. Will continue to monitor neuro status.   [place]us Knoll, RN

## 2023-12-17 NOTE — Progress Notes (Signed)
 Patient ID: Troy Bishop, male   DOB: 09-21-1990, 33 y.o.   MRN: [id number] BP (!) 136/96   Pulse 86   Temp 99.4 F (37.4 C) (Oral)   Resp 20   Wt 63.4 kg   SpO2 100%   BMI 22.56 kg/m  Lethargic, will open eyes Plegic on left side Does not need repeat CT's unless exam changes Unlikely at this point he would need operative evacuation.

## 2023-12-17 NOTE — Progress Notes (Addendum)
 STROKE TEAM PROGRESS NOTE    SIGNIFICANT HOSPITAL EVENTS 5/10: Patient admitted with large right sided subcortical hemorrhage, started on hypertonic saline, treatment for alcohol withdrawal started later in the day  INTERIM HISTORY/SUBJECTIVE  Patient has a Tmax of 102, will order UA and chest x-ray.  He continues to require Cleviprex, but hopefully this can be weaned off today.  He is less agitated now that Precedex has been started.  Family remains at the bedside  OBJECTIVE  CBC    Component Value Date/Time   WBC 5.5 12/17/2023 1015   RBC 4.40 12/17/2023 1015   HGB 14.0 12/17/2023 1015   HCT 41.2 12/17/2023 1015   PLT 60 (L) 12/17/2023 1015   MCV 93.6 12/17/2023 1015   MCH 31.8 12/17/2023 1015   MCHC 34.0 12/17/2023 1015   RDW 14.4 12/17/2023 1015   LYMPHSABS 0.9 12/16/2023 0024   MONOABS 0.6 12/16/2023 0024   EOSABS 0.1 12/16/2023 0024   BASOSABS 0.0 12/16/2023 0024    BMET    Component Value Date/Time   NA 138 12/17/2023 1015   K 3.2 (L) 12/17/2023 1015   CL 105 12/17/2023 1015   CO2 22 12/17/2023 1015   GLUCOSE 114 (H) 12/17/2023 1015   BUN 6 12/17/2023 1015   CREATININE 0.81 12/17/2023 1015   CALCIUM 7.9 (L) 12/17/2023 1015   GFRNONAA >60 12/17/2023 1015    IMAGING past 24 hours CT HEAD WO CONTRAST ( ) Result Date: 12/17/2023 CLINICAL DATA:  Hemorrhagic stroke follow-up EXAM: CT HEAD WITHOUT CONTRAST TECHNIQUE: Contiguous axial images were obtained from the base of the skull through the vertex without intravenous contrast. RADIATION DOSE REDUCTION: This exam was performed according to the departmental dose-optimization program which includes automated exposure control, adjustment of the mA and/or kV according to patient size and/or use of iterative reconstruction technique. COMPARISON:  Yesterday FINDINGS: Brain: Unchanged size and shape of the right ICH centered at the putamen and dissecting into adjacent white matter structures, up to 5.7 cm anterior to  posterior at the dominant portion of the hematoma on sagittal reformats. Mild adjacent edema. Midline shift measures 6 mm. No entrapment or evidence of acute infarction. Vascular: No hyperdense vessel or unexpected calcification. Skull: Normal. Negative for fracture or focal lesion. Sinuses/Orbits: No acute finding. IMPRESSION: Unchanged right cerebral hematoma with 5 mm of midline shift. Electronically Signed   By: Ronnette Coke M.D.   On: 12/17/2023 06:07    Vitals:   12/17/23 1211 12/17/23 1215 12/17/23 1300 12/17/23 1330  BP: (!) 152/108 (!) 161/104 (!) 172/112 (!) 163/102  Pulse: 78 75 74 77  Resp: 15 (!) 26 (!) 29 19  Temp:      TempSrc:      SpO2: 97% 99% 99% 98%  Weight:         PHYSICAL EXAM General: Ill-appearing patient in no acute distress Psych: Affect blunted CV: Regular, tachycardic rate and rhythm on monitor Respiratory:  Regular, unlabored respirations on room air  NEURO:  Mental Status: Patient is drowsy and oriented to person and place.  He rests with eyes closed but will respond to loud voice or touch Speech/Language: speech is with some dysarthria.    Cranial Nerves:  II: PERRL.  III, IV, VI: EOMI. Eyelids elevate symmetrically.  VII: Left facial droop VIII: hearing intact to voice. IX, X: Voice is dysarthric XII: tongue is midline without fasciculations. Motor: Able to move right upper and lower extremity with good antigravity strength, able to move fingers of the left hand  but cannot lift it off the bed, no movement of left lower extremity Tone: is normal and bulk is normal Sensation- Intact to light touch bilaterally. Coordination: Unable to perform Gait- deferred  Most Recent NIH  1a Level of Conscious.: 0 1b LOC Questions: 0 1c LOC Commands: 0 2 Best Gaze: 0 3 Visual: 0 4 Facial Palsy: 1 5a Motor Arm - left: 3 5b Motor Arm - Right: 0 6a Motor Leg - Left: 4 6b Motor Leg - Right: 0 7 Limb Ataxia: 0 8 Sensory: 0 9 Best Language: 0 10  Dysarthria: 1 11 Extinct. and Inatten.:  TOTAL: 9   ASSESSMENT/PLAN  [name] is a 33 y.o. male with history of hypertension, seizure and daily alcohol use admitted for acute onset left-sided weakness.  He was found to have a large right subcortical ICH.  On later CT angiogram, focus of contrast-enhancement within cerebral hematoma was found, and repeat CT demonstrated enlargement of ICH.  He received platelet transfusion for platelet count of 54.  However, follow-up CT in the morning of 5/10 revealed stable ICH.  Neurosurgery was consulted but has declined to intervene.  Cleviprex was used for blood pressure control, and hypertonic saline was initiated to decrease edema.  In the morning of 5/10, patient became restless, agitated, diaphoretic and developed nausea.  He reports that he has 2-3 drinks of alcohol daily, and phenobarbital taper along with Precedex drip were initiated to treat alcohol withdrawal.  NIH on Admission 18, ICH score 1  Intracerebral Hemorrhage:  right subcortical ICH Etiology: Likely hypertensive Code Stroke CT head 35 mm ICH in right frontotemporal region Repeat CT head interval increase in size of right cerebral hemorrhage, now estimated volume 52 mL CTA head & neck focus of contrast enhancement within bed of right cerebral hematoma, no other underlying vascular abnormality Repeat CT head 5/10 unchanged right cerebral hematoma with 6 mm of midline shift MRI pending 2D Echo pending LDL 124 HgbA1c pending VTE prophylaxis -SCDs No antithrombotic prior to admission, now on No antithrombotic secondary to ICH Therapy recommendations:  Pending Disposition: Pending  Cerebral edema (brain compression) Large right ICH with 6 mm midline shift noted on CT 5/10 Continue 3% hypertonic saline at 75 cc/h-> discontinued 5/11 given stable edema on CT and stable neurological status Monitor sodium every 6 hours, last 142-> 138 Repeat head CT 5/11  Hypertension Home meds:  None Unstable, requiring Cleviprex As needed hydralazine and labetalol Start amlodipine 10 mg daily Blood Pressure Goal: SBP between 130-150 for 24 hours and then less than 160   Hyperlipidemia Home meds: None LDL 124, goal < 70 Add statin at discharge if LDL greater than goal  Risk for diabetes type II  Home meds: None HgbA1c pending, goal < 7.0  Alcohol withdrawal Patient states he drinks 2-3 drinks per day EtOH level on admission 264 Patient noted to be tachycardic, diaphoretic, agitated, restless with nausea and vomiting on 5/10 CCM consulted and phenobarbital taper initiated for EtOH withdrawal Patient requiring IV Precedex for withdrawal related agitation  Dysphagia Patient has post-stroke dysphagia, SLP consulted    Diet   Diet Heart Room service appropriate? Yes with Assist; Fluid consistency: Thin   Advance diet as tolerated  Other Stroke Risk Factors ETOH use, alcohol level 264, will advise cessation when patient more oriented    Other Active Problems Fever-5/11 Tmax 102-UA and chest x-ray ordered  Hospital day # 1  Patient seen by NP and then by MD, MD to edit note as  needed. Cortney E Bucky Cardinal , MSN, AGACNP-BC [facility] See Amion for schedule and pager information 12/17/2023 1:50 PM   ATTENDING ATTESTATION:   33 year old with large right subcortical hematoma.  Nonsurgical candidate.  Appreciate neurosurgical input.  On CIWA protocol and doing well.  Appreciate CCM assistance.  Sodium is still at 135.  Will stop hypertonic.  Goal systolic blood pressure less than 160.  Add Norvasc 10 mg with a goal of titrating off Cleviprex.  Continue close ICU monitoring.  Discussed with ICU nurse to remove restraints during the day as family will be around and willing to help calm him.    Dr. Dahlia Dross evaluated pt independently, reviewed imaging, chart, labs. Discussed and formulated plan with the Resident/APP. Changes were made to the note where  appropriate. Please see APP/resident note above for details.      This patient is critically ill due to respiratory distress, hemorrhagic stroke with possibility of worsening edema and alcohol withdrawal on CIWA protocol and at significant risk of neurological worsening, death form heart failure, respiratory failure, recurrent stroke, bleeding from [facility], seizure, sepsis. This patient's care requires constant monitoring of vital signs, hemodynamics, respiratory and cardiac monitoring, review of multiple databases, neurological assessment, discussion with family, other specialists and medical decision making of high complexity. I spent 35 minutes of neurocritical care time in the care of this patient.    Doryan Bahl,MD    To contact Stroke Continuity provider, please refer to [email or web address]. After hours, contact General Neurology

## 2023-12-17 NOTE — Progress Notes (Addendum)
 eLink Physician-Brief Progress Note Patient Name: Troy Bishop DOB: 1990-11-25 MRN: [id number]   Date of Service  12/17/2023  HPI/Events of Note  E link asked to follow 8 pm BMP. It has now resulted. Was given IV kcl earlier 50 meq.   eICU Interventions  60 meq IV Kcl ordered Recheck in AM      Intervention Category Major Interventions: Electrolyte abnormality - evaluation and management  Troy Bishop 12/17/2023, 10:02 PM  Addendum at 11:30 pm - Fever. NPO. Has oral tylenol  and suppository ordered but also with rectal pouch so asking for IV tylenol . One time dose ordered. Liver enzymes are ordered for AM.

## 2023-12-17 NOTE — Progress Notes (Signed)
EEG complete, results pending 

## 2023-12-18 ENCOUNTER — Inpatient Hospital Stay (HOSPITAL_COMMUNITY)

## 2023-12-18 DIAGNOSIS — E785 Hyperlipidemia, unspecified: Secondary | ICD-10-CM

## 2023-12-18 DIAGNOSIS — I10 Essential (primary) hypertension: Secondary | ICD-10-CM | POA: Diagnosis not present

## 2023-12-18 DIAGNOSIS — I619 Nontraumatic intracerebral hemorrhage, unspecified: Secondary | ICD-10-CM

## 2023-12-18 DIAGNOSIS — R569 Unspecified convulsions: Secondary | ICD-10-CM | POA: Diagnosis not present

## 2023-12-18 DIAGNOSIS — K7030 Alcoholic cirrhosis of liver without ascites: Secondary | ICD-10-CM | POA: Diagnosis not present

## 2023-12-18 DIAGNOSIS — I629 Nontraumatic intracranial hemorrhage, unspecified: Secondary | ICD-10-CM | POA: Diagnosis not present

## 2023-12-18 DIAGNOSIS — D696 Thrombocytopenia, unspecified: Secondary | ICD-10-CM

## 2023-12-18 DIAGNOSIS — R7989 Other specified abnormal findings of blood chemistry: Secondary | ICD-10-CM

## 2023-12-18 DIAGNOSIS — G936 Cerebral edema: Secondary | ICD-10-CM | POA: Diagnosis not present

## 2023-12-18 DIAGNOSIS — I69391 Dysphagia following cerebral infarction: Secondary | ICD-10-CM | POA: Diagnosis not present

## 2023-12-18 DIAGNOSIS — R509 Fever, unspecified: Secondary | ICD-10-CM | POA: Diagnosis not present

## 2023-12-18 LAB — HEMOGLOBIN A1C
Hgb A1c MFr Bld: 5.6 % (ref 4.8–5.6)
Mean Plasma Glucose: 114 mg/dL

## 2023-12-18 LAB — COMPREHENSIVE METABOLIC PANEL WITH GFR
ALT: 119 U/L — ABNORMAL HIGH (ref 0–44)
AST: 114 U/L — ABNORMAL HIGH (ref 15–41)
Albumin: 3.1 g/dL — ABNORMAL LOW (ref 3.5–5.0)
Alkaline Phosphatase: 65 U/L (ref 38–126)
Anion gap: 11 (ref 5–15)
BUN: 9 mg/dL (ref 6–20)
CO2: 18 mmol/L — ABNORMAL LOW (ref 22–32)
Calcium: 8.1 mg/dL — ABNORMAL LOW (ref 8.9–10.3)
Chloride: 107 mmol/L (ref 98–111)
Creatinine, Ser: 0.7 mg/dL (ref 0.61–1.24)
GFR, Estimated: >60 mL/min (ref >60)
Glucose, Bld: 100 mg/dL — ABNORMAL HIGH (ref 70–99)
Potassium: 3.7 mmol/L (ref 3.5–5.1)
Sodium: 136 mmol/L (ref 135–145)
Total Bilirubin: 1.8 mg/dL — ABNORMAL HIGH (ref 0.0–1.2)
Total Protein: 7.2 g/dL (ref 6.5–8.1)

## 2023-12-18 LAB — ECHOCARDIOGRAM COMPLETE
AR max vel: 2.68 cm2
AV Area VTI: 3.09 cm2
AV Area mean vel: 2.72 cm2
AV Mean grad: 2 mmHg
AV Peak grad: 4.2 mmHg
Ao pk vel: 1.02 m/s
Area-P 1/2: 4.93 cm2
Calc EF: 61.3 %
MV VTI: 2.6 cm2
S' Lateral: 3.5 cm
Single Plane A2C EF: 68.6 %
Single Plane A4C EF: 53 %
Weight: 2236.35 [oz_av]

## 2023-12-18 LAB — SODIUM
Sodium: 135 mmol/L (ref 135–145)
Sodium: 138 mmol/L (ref 135–145)

## 2023-12-18 LAB — CBC
HCT: 41.2 % (ref 39.0–52.0)
Hemoglobin: 13.9 g/dL (ref 13.0–17.0)
MCH: 31 pg (ref 26.0–34.0)
MCHC: 33.7 g/dL (ref 30.0–36.0)
MCV: 91.8 fL (ref 80.0–100.0)
Platelets: 49 10*3/uL — ABNORMAL LOW (ref 150–400)
RBC: 4.49 MIL/uL (ref 4.22–5.81)
RDW: 14.2 % (ref 11.5–15.5)
WBC: 4.6 10*3/uL (ref 4.0–10.5)
nRBC: 0 % (ref 0.0–0.2)

## 2023-12-18 LAB — PHOSPHORUS: Phosphorus: 3.1 mg/dL (ref 2.5–4.6)

## 2023-12-18 LAB — MAGNESIUM
Magnesium: 1.6 mg/dL — ABNORMAL LOW (ref 1.7–2.4)
Magnesium: 2.5 mg/dL — ABNORMAL HIGH (ref 1.7–2.4)

## 2023-12-18 LAB — PROTIME-INR
INR: 1.1 (ref 0.8–1.2)
Prothrombin Time: 14.1 s (ref 11.4–15.2)

## 2023-12-18 LAB — PROCALCITONIN: Procalcitonin: 0.29 ng/mL

## 2023-12-18 LAB — AMMONIA: Ammonia: 72 umol/L — ABNORMAL HIGH (ref 9–35)

## 2023-12-18 MED ORDER — ACETAMINOPHEN 10 MG/ML IV SOLN
1000.0000 mg | Freq: Four times a day (QID) | INTRAVENOUS | Status: DC
  Administered 2023-12-18: 1000 mg via INTRAVENOUS
  Filled 2023-12-18: qty 100

## 2023-12-18 MED ORDER — THIAMINE HCL 100 MG/ML IJ SOLN
500.0000 mg | Freq: Three times a day (TID) | INTRAVENOUS | Status: AC
  Administered 2023-12-18 – 2023-12-19 (×6): 500 mg via INTRAVENOUS
  Filled 2023-12-18: qty 5
  Filled 2023-12-18 (×2): qty 500
  Filled 2023-12-18: qty 5
  Filled 2023-12-18: qty 3
  Filled 2023-12-18: qty 5

## 2023-12-18 MED ORDER — LOSARTAN POTASSIUM 50 MG PO TABS
50.0000 mg | ORAL_TABLET | Freq: Every day | ORAL | Status: DC
  Administered 2023-12-18: 50 mg via ORAL
  Filled 2023-12-18 (×2): qty 1

## 2023-12-18 MED ORDER — HEPARIN SODIUM (PORCINE) 5000 UNIT/ML IJ SOLN
5000.0000 [IU] | Freq: Three times a day (TID) | INTRAMUSCULAR | Status: DC
  Administered 2023-12-18 – 2023-12-30 (×36): 5000 [IU] via SUBCUTANEOUS
  Filled 2023-12-18 (×36): qty 1

## 2023-12-18 MED ORDER — LOSARTAN POTASSIUM 50 MG PO TABS
100.0000 mg | ORAL_TABLET | Freq: Every day | ORAL | Status: DC
  Administered 2023-12-19 – 2023-12-21 (×3): 100 mg via ORAL
  Filled 2023-12-18 (×3): qty 2

## 2023-12-18 MED ORDER — THIAMINE HCL 100 MG/ML IJ SOLN
100.0000 mg | INTRAMUSCULAR | Status: DC
Start: 2023-12-20 — End: 2023-12-23
  Administered 2023-12-20 – 2023-12-23 (×4): 100 mg via INTRAVENOUS
  Filled 2023-12-18 (×4): qty 2

## 2023-12-18 MED ORDER — ATORVASTATIN CALCIUM 40 MG PO TABS: 40.0000 mg | ORAL_TABLET | Freq: Every day | ORAL | Status: DC

## 2023-12-18 MED ORDER — LOSARTAN POTASSIUM 50 MG PO TABS
50.0000 mg | ORAL_TABLET | Freq: Once | ORAL | Status: AC
  Administered 2023-12-18: 50 mg via ORAL
  Filled 2023-12-18: qty 1

## 2023-12-18 MED ORDER — MAGNESIUM SULFATE 4 GM/100ML IV SOLN
4.0000 g | Freq: Once | INTRAVENOUS | Status: AC
  Administered 2023-12-18: 4 g via INTRAVENOUS
  Filled 2023-12-18: qty 100

## 2023-12-18 NOTE — Evaluation (Addendum)
 Physical Therapy Evaluation Patient Details Name: Troy Bishop MRN: [id number] DOB: [birth year] Today's Date: 12/18/2023  History of Present Illness  Pt is a 33 y.o. male presenting 5/10 after sudden onset L weakness and collapse. CTH with large L subcortical hemorrhage, repeat CT 1 hour later showing progression of ICH from 35mL to 52mL with small volume hemorrhage within the  right lateral ventricle, consistent with intraventricular extension. PMH significant of uncontrolled HTN, cirrhosis due to alcohol abuse, thrombocytopenia  Clinical Impression  Pt admitted with above. Pt presents with dysarthric speech, left hemiparesis, neglect and impaired sitting balance. Pt has a right gaze preference; able to cross midline with cueing. No withdrawal to painful stimuli on L side. Pt requiring two person assist for bed mobility and transfers to standing with left knee block. Pt unable to accept weight through LLE in standing position, but did note glute activation with bridging in bed. Patient will benefit from intensive inpatient follow-up therapy, >3 hours/day in order to address deficits and maximize functional mobility. Suspect good progress given age,PLOF, and motivation.       If plan is discharge home, recommend the following: Two people to help with walking and/or transfers;Two people to help with bathing/dressing/bathroom   Can travel by private vehicle        Equipment Recommendations Other (comment) (TBA)  Recommendations for Other Services  Rehab consult    Functional Status Assessment Patient has had a recent decline in their functional status and demonstrates the ability to make significant improvements in function in a reasonable and predictable amount of time.     Precautions / Restrictions Precautions Precautions: Fall;Other (comment) Recall of Precautions/Restrictions: Impaired Precaution/Restrictions Comments: L hemi, neglect Restrictions Weight Bearing Restrictions Per Provider  Order: No      Mobility  Bed Mobility Overal bed mobility: Needs Assistance Bed Mobility: Rolling, Sidelying to Sit, Sit to Supine Rolling: Mod assist Sidelying to sit: Max assist, +2 for physical assistance   Sit to supine: Max assist, +2 for physical assistance   General bed mobility comments: Pt rolling towards left with hand over hand guidance for location of bed rail, assist for BLE's off edge of bed, OT cueing to "push off LLE with RLE" and trunk assist provided to bring upright.    Transfers Overall transfer level: Needs assistance Equipment used: 2 person hand held assist Transfers: Sit to/from Stand Sit to Stand: Max assist, +2 physical assistance           General transfer comment: MaxA + 2 to stand from edge of bed x 2, L knee block and + buckle    Ambulation/Gait               General Gait Details: unable  Stairs            Wheelchair Mobility     Tilt Bed    Modified Rankin (Stroke Patients Only) Modified Rankin (Stroke Patients Only) Pre-Morbid Rankin Score: No symptoms Modified Rankin: Severe disability     Balance Overall balance assessment: Needs assistance Sitting-balance support: Feet supported Sitting balance-Leahy Scale: Poor Sitting balance - Comments: Left lateral lean. Sat on pt R and provided cue to "touch your shoulder to my shoulder" to achieve midline.   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 [employer]       Extremity/Trunk Assessment    Upper Extremity Assessment Upper Extremity Assessment: Defer to OT evaluation    Lower Extremity Assessment Lower Extremity Assessment: LLE deficits/detail LLE Deficits / Details: No active movement noted, no response to painful stimuli    Cervical / Trunk Assessment Cervical / Trunk Assessment: Normal  Communication   Communication Communication: Impaired Factors Affecting Communication: Reduced clarity of speech    Cognition Arousal: Alert Behavior During Therapy: Flat affect   PT - Cognitive impairments: Difficult to assess Difficult to assess due to: Impaired communication                     PT - Cognition Comments: Pt following 1 step commands with increased time, repetition and multimodal cueing Following commands: Impaired Following commands impaired: Follows one step commands with increased time, Follows one step commands inconsistently     Cueing Cueing Techniques: Verbal cues, Gestural cues, Tactile cues     General Comments      Exercises Other Exercises Other Exercises: Supine: bridging x 3 with LLE support   Assessment/Plan    PT Assessment Patient needs continued PT services  PT Problem List Decreased strength;Decreased activity tolerance;Decreased balance;Decreased mobility;Decreased cognition;Decreased safety awareness;Impaired sensation       PT Treatment Interventions DME instruction;Gait training;Therapeutic activities;Functional mobility training;Balance training;Therapeutic exercise;Neuromuscular re-education;Cognitive remediation;Patient/family education    PT Goals (Current goals can be found in the Care Plan section)  Acute Rehab PT Goals Patient Stated Goal: did not state PT Goal Formulation: With patient Time For Goal Achievement: 01/01/24 Potential to Achieve Goals: Good    Frequency Min 3X/week     Co-evaluation PT/OT/SLP Co-Evaluation/Treatment: Yes Reason for Co-Treatment: Complexity of the patient's impairments  (multi-system involvement);For patient/therapist safety;To address functional/ADL transfers;Necessary to address cognition/behavior during functional activity PT goals addressed during session: Mobility/safety with mobility;Balance         AM-PAC PT "6 Clicks" Mobility  Outcome Measure Help needed turning from your back to your side while in a flat bed without using bedrails?: A Lot Help needed moving from lying on your back to sitting on the side of a flat bed without using bedrails?: Total Help needed moving to and from a bed to a chair (including a wheelchair)?: Total Help needed standing up from a chair using your arms (e.g., wheelchair or bedside chair)?: Total Help needed to walk in hospital room?: Total Help needed climbing 3-5 steps with a railing? : Total 6 Click Score: 7    End of Session Equipment Utilized During Treatment: Gait belt Activity Tolerance: Patient tolerated treatment well Patient left: in bed;with call bell/phone within reach;with bed alarm set Nurse Communication: Mobility status PT Visit Diagnosis: Hemiplegia and hemiparesis;Unsteadiness on feet (R26.81) Hemiplegia - Right/Left: Left Hemiplegia - dominant/non-dominant: Non-dominant Hemiplegia - caused by: Nontraumatic intracerebral hemorrhage    Time: 1610-9604 PT Time Calculation (min) (ACUTE ONLY): 27 min   Charges:   PT Evaluation $PT Eval Moderate Complexity: 1 Mod   PT General Charges $$ ACUTE PT VISIT: 1 Visit  Verdia Glad, PT, DPT Acute Rehabilitation Services Office [phone]   Claria Crofts 12/18/2023, 2:44 PM

## 2023-12-18 NOTE — Progress Notes (Signed)
 STROKE TEAM PROGRESS NOTE   SIGNIFICANT HOSPITAL EVENTS 5/10: Patient admitted with large right sided subcortical hemorrhage, started on hypertonic saline, treatment for alcohol withdrawal started later in the day  INTERIM HISTORY/SUBJECTIVE Pt male friend and father are at the bedside. Pt lethargic but awake alert, follow most simple commands, moderate dysarthria, denies significant headache.  Now on 3% saline  OBJECTIVE  CBC    Component Value Date/Time   WBC 4.6 12/18/2023 0643   RBC 4.49 12/18/2023 0643   HGB 13.9 12/18/2023 0643   HCT 41.2 12/18/2023 0643   PLT 49 (L) 12/18/2023 0643   MCV 91.8 12/18/2023 0643   MCH 31.0 12/18/2023 0643   MCHC 33.7 12/18/2023 0643   RDW 14.2 12/18/2023 0643   LYMPHSABS 0.9 12/16/2023 0024   MONOABS 0.6 12/16/2023 0024   EOSABS 0.1 12/16/2023 0024   BASOSABS 0.0 12/16/2023 0024    BMET    Component Value Date/Time   NA 136 12/18/2023 0902   K 3.7 12/18/2023 0902   CL 107 12/18/2023 0902   CO2 18 (L) 12/18/2023 0902   GLUCOSE 100 (H) 12/18/2023 0902   BUN 9 12/18/2023 0902   CREATININE 0.70 12/18/2023 0902   CALCIUM 8.1 (L) 12/18/2023 0902   GFRNONAA >60 12/18/2023 0902    IMAGING past 24 hours EEG adult Result Date: 12/18/2023 Eleni Griffin, MD     12/18/2023  7:26 AM Routine EEG Report Kishore Werthman is a 33 y.o. male with a history of R ICH and AMS who is undergoing an EEG to evaluate for seizures. Report: This EEG was acquired with electrodes placed according to the International 10-20 electrode system (including Fp1, Fp2, F3, F4, C3, C4, P3, P4, O1, O2, T3, T4, T5, T6, A1, A2, Fz, Cz, Pz). The following electrodes were missing or displaced: none. The occipital dominant rhythm was 8.5 Hz. This activity is reactive to stimulation. Drowsiness was manifested by background fragmentation; deeper stages of sleep were not identified. There was focal slowing over the right hemisphere. There were no interictal epileptiform discharges. There  were no electrographic seizures identified. Photic stimulation and hyperventilation were not performed. Impression and clinical correlation: This EEG was obtained while awake and drowsy and is abnormal due to R focal slowing over the region of intracerebral hemorrhage. Epileptiform abnormalities were not seen during this recording. Greg Leaks, MD [facility] [phone] If 7pm- 7am, please page neurology on call as listed in AMION.   CT HEAD WO CONTRAST ( ) Result Date: 12/17/2023 CLINICAL DATA:  Follow-up examination for intracranial hemorrhage, headache, neuro deficit. EXAM: CT HEAD WITHOUT CONTRAST TECHNIQUE: Contiguous axial images were obtained from the base of the skull through the vertex without intravenous contrast. RADIATION DOSE REDUCTION: This exam was performed according to the departmental dose-optimization program which includes automated exposure control, adjustment of the mA and/or kV according to patient size and/or use of iterative reconstruction technique. COMPARISON:  CT from earlier the same day at 5:58 a.m. FINDINGS: Brain: Previously identified intraparenchymal hematoma centered at the right lentiform nucleus with extension into the adjacent right temporal cerebral white matter again seen, not significantly changed in size or morphology measuring up to 5.7 cm in dimension. Similar localized edema and regional mass effect with partial effacement of the right lateral ventricle and 5-6 mm of right-to-left shift. Stable ventricular size and morphology without hydrocephalus. No progressive intraventricular or extra-axial extension or other interval complicating features. Basilar cisterns remain patent. No other new intracranial hemorrhage or large vessel territory infarct. No extra-axial  fluid collection. Vascular: No abnormal hyperdense vessel. Skull: Scalp soft tissues and calvarium demonstrate no new finding. Sinuses/Orbits: Globes orbital soft tissues demonstrate no acute  finding. Remote posttraumatic defect at the left lamina papyracea. Paranasal sinuses and mastoid air cells remain largely clear. Other: None. IMPRESSION: 1. No significant interval change in size and morphology of intraparenchymal hematoma centered at the right lentiform nucleus. Similar regional mass effect with 5-6 mm of right-to-left shift. No hydrocephalus or other complicating features. 2. No other new acute intracranial abnormality. Electronically Signed   By: Virgia Griffins M.D.   On: 12/17/2023 18:45   DG Abd 1 View Result Date: 12/17/2023 CLINICAL DATA:  Vomiting. EXAM: ABDOMEN - 1 VIEW COMPARISON:  None Available. FINDINGS: Air within nondilated stomach. No small bowel distension or evidence of obstruction. There is air throughout nondilated colon. No significant formed colonic stool. No radiopaque calculi or abnormal soft tissue calcifications. No osseous abnormalities are seen. IMPRESSION: Nonobstructive bowel gas pattern. Electronically Signed   By: Chadwick Colonel M.D.   On: 12/17/2023 18:26   DG CHEST PORT 1 VIEW Result Date: 12/17/2023 CLINICAL DATA:  Fever. EXAM: PORTABLE CHEST 1 VIEW COMPARISON:  05/03/2017 FINDINGS: Upper normal heart size likely accentuated by portable AP technique. Mediastinal contours are stable. No confluent consolidation. No pleural fluid or pneumothorax. No pulmonary edema. IMPRESSION: No acute chest findings. Electronically Signed   By: Chadwick Colonel M.D.   On: 12/17/2023 18:26    Vitals:   12/18/23 0700 12/18/23 0800 12/18/23 0900 12/18/23 1000  BP: (!) 137/100 (!) 124/96 (!) 154/103 (!) 157/115  Pulse: 77 74 (!) 59 71  Resp: 15 13 13 16   Temp:  98 F (36.7 C)    TempSrc:  Axillary    SpO2: 93% 97% 100% 100%  Weight:         PHYSICAL EXAM General: Ill-appearing patient in no acute distress Psych: Affect blunted CV: Regular, tachycardic rate and rhythm on monitor Respiratory:  Regular, unlabored respirations on room air  NEURO:  Pt  awake, alert, eyes open, orientated to place, time and people but tole me age 65 first and then 28 instead of 42. No aphasia but paucity of speech with severe dysarthria, able to follow all simple commands and answer all orientation questions. Able to name and repeat in severely dysarthric voice. No gaze palsy, tracking bilaterally, however, left hemianopia vs. Visual neglect. Left asomatognosia. Left facial droop. Tongue midline. RUE and RLE 5/5, LUE and LLE flaccid with LLE 1/5 the most. Sensation symmetrical bilaterally subjectively but left sensory neglect, right FTN intact, gait not tested.     ASSESSMENT/PLAN  Mr. Curtez Hubley is a 33 y.o. male with history of hypertension, seizure and daily alcohol use admitted for acute onset left-sided weakness.  He was found to have a large right subcortical ICH.  On later CT angiogram, focus of contrast-enhancement within cerebral hematoma was found, and repeat CT demonstrated enlargement of ICH.  He received platelet transfusion for platelet count of 54.  However, follow-up CT in the morning of 5/10 revealed stable ICH.  Neurosurgery was consulted but has declined to intervene.  Cleviprex was used for blood pressure control, and hypertonic saline was initiated to decrease edema.  In the morning of 5/10, patient became restless, agitated, diaphoretic and developed nausea.  He reports that he has 2-3 drinks of alcohol daily, and phenobarbital taper along with Precedex drip were initiated to treat alcohol withdrawal.  NIH on Admission 18, ICH score 1  ICH:  right BG large ICH, etiology: Likely hypertensive Code Stroke CT head 35 mm ICH in right frontotemporal region CTA head & neck focus of contrast enhancement within bed of right cerebral hematoma, no other underlying vascular abnormality Repeat CT head interval increase in size of right cerebral hemorrhage, now estimated volume 52 mL Repeat CT head 5/10 unchanged right cerebral hematoma with 6 mm of midline  shift MRI pending 2D Echo EF 60 to 65% LDL 124 HgbA1c 5.6 UDS positive for barbiturates [facility] medication) VTE prophylaxis -heparin subcu No antithrombotic prior to admission, now on No antithrombotic secondary to ICH Therapy recommendations: CIR Disposition: Pending  Cerebral edema Large right ICH with 6 mm midline shift noted on CT 5/10 and 5/11 Continue 3% hypertonic saline at 75 cc/h Monitor sodium every 6 hours  Na 142-> 138->132->136->138 MRI pending  Hypertension Home meds: None Still on Cleviprex, taper off as able As needed hydralazine and labetalol On amlodipine 10 mg daily Add cozaar 50->100 BP goal less than 160 Long-term BP goal normotensive  Hyperlipidemia Home meds: None LDL 124, goal < 70 Consider statin once LFT normalizes  Alcohol withdrawal Patient states he drinks 2-3 drinks per day EtOH level on admission 264 Patient noted to be tachycardic, diaphoretic, agitated, restless with nausea and vomiting on 5/10 CCM consulted and phenobarbital taper initiated for EtOH withdrawal Patient requiring IV Precedex for withdrawal related agitation  Dysphagia Patient has post-stroke dysphagia SLP consulted Now NPO but sip with meds Advance diet as tolerated or consider TF  Thrombocytopenia Elevated LFTs Platelet 56--60--49 Status post platelet transfusion x 1 Likely related to chronic alcohol use CBC monitoring AST/ALT 146/144--114/119--pending  Fever, likely central fever Tmax 101.2-- 101.1 CXR negative UA negative Monitor fever curve  Other Stroke Risk Factors   Other Active Problems Hypokalemia, K3.2--3.7  Hospital day # 2  This patient is critically ill due to large ICH, alcohol withdrawal, thrombocytopenia, elevated LFTs and fever and at significant risk of neurological worsening, death form brain herniation, hematoma expansion, DT, liver failure, bleeding from thrombocytopenia. This patient's care requires constant monitoring of vital  signs, hemodynamics, respiratory and cardiac monitoring, review of multiple databases, neurological assessment, discussion with family, other specialists and medical decision making of high complexity. I spent 50 minutes of neurocritical care time in the care of this patient.  Consuelo Denmark, MD PhD Stroke Neurology 12/18/2023 9:36 PM   To contact Stroke Continuity provider, please refer to [email or web address]. After hours, contact General Neurology

## 2023-12-18 NOTE — Progress Notes (Signed)

## 2023-12-18 NOTE — Progress Notes (Signed)
  Echocardiogram 2D Echocardiogram has been performed.  Toryn Mcclinton L Romie Keeble RDCS 12/18/2023, 8:51 AM

## 2023-12-18 NOTE — Progress Notes (Signed)
 OT Cancellation Note  Patient Details Name: Troy Bishop MRN: [id number] DOB: 07-26-1991   Cancelled Treatment:    Reason Eval/Treat Not Completed: Patient not medically ready (RN requesting to hold- trying to decrease of cleviprex)   Neomia Banner 12/18/2023, 8:26 AM

## 2023-12-18 NOTE — Progress Notes (Signed)
 SLP Cancellation Note  Patient Details Name: Troy Bishop MRN: [id number] DOB: [birth year]   Cancelled treatment:       Reason Eval/Treat Not Completed: Patient's level of consciousness   Terisha Losasso, Hardin Leys 12/18/2023, 8:51 AM

## 2023-12-18 NOTE — Evaluation (Signed)
 Occupational Therapy Evaluation Patient Details Name: Troy Bishop MRN: [id number] DOB: [birth year] Today's Date: 12/18/2023   History of Present Illness   Pt is a 33 y.o. male presenting 5/10 after sudden onset L weakness and collapse. CTH with large L subcortical hemorrhage, repeat CT 1 hour later showing progression of ICH from 35mL to 52mL with small volume hemorrhage within the  right lateral ventricle, consistent with intraventricular extension. PMH significant of uncontrolled HTN, cirrhosis due to alcohol abuse, thrombocytopenia     Clinical Impressions PTA, pt lived with father per his report; otherwise difficulty providing history. Upon eval, pt with R gaze preference, poor seated and standing balance with tendency to push L, L hemibody hemiplegia, decreased cognition, visual field deficit, and safety. Pt needing max-total A for ADL at time of eval. Follows one step commands during ADL and for visual compensation (able to scan toward L with cues and increased time). Will continue to follow acutely. Due to significant change in functional status, recommending intensive multidisciplinary rehabilitation >3 hours/day to optimize safety and independence in ADL.       If plan is discharge home, recommend the following:   Two people to help with walking and/or transfers;Two people to help with bathing/dressing/bathroom;Assist for transportation;Help with stairs or ramp for entrance;Direct supervision/assist for medications management;Direct supervision/assist for financial management;Assistance with feeding;Assistance with cooking/housework     Functional Status Assessment   Patient has had a recent decline in their functional status and demonstrates the ability to make significant improvements in function in a reasonable and predictable amount of time.     Equipment Recommendations   Other (comment) (defer)     Recommendations for Other Services   Rehab consult      Precautions/Restrictions   Precautions Precautions: Fall;Other (comment) Recall of Precautions/Restrictions: Impaired Precaution/Restrictions Comments: L hemi, neglect Restrictions Weight Bearing Restrictions Per Provider Order: No     Mobility Bed Mobility Overal bed mobility: Needs Assistance Bed Mobility: Rolling, Sidelying to Sit, Sit to Supine Rolling: Mod assist Sidelying to sit: Max assist, +2 for physical assistance   Sit to supine: Max assist, +2 for physical assistance   General bed mobility comments: Pt rolling towards left with hand over hand guidance for location of bed rail, assist for BLE's off edge of bed, OT cueing to "push off LLE with RLE" and trunk assist provided to bring upright.    Transfers Overall transfer level: Needs assistance Equipment used: 2 person hand held assist Transfers: Sit to/from Stand Sit to Stand: Max assist, +2 physical assistance           General transfer comment: MaxA + 2 to stand from edge of bed x 2, L knee block and + buckle      Balance Overall balance assessment: Needs assistance Sitting-balance support: Feet supported Sitting balance-Leahy Scale: Poor Sitting balance - Comments: Left lateral lean. Sat on pt R and provided cue to "touch your shoulder to my shoulder" to achieve midline.   Standing balance support: Bilateral upper extremity supported Standing balance-Leahy Scale: Zero                             ADL either performed or assessed with clinical judgement   ADL Overall ADL's : Needs assistance/impaired Eating/Feeding: NPO   Grooming: Moderate assistance;Maximal assistance;Sitting   Upper Body Bathing: Maximal assistance;Sitting   Lower Body Bathing: Total assistance;+2 for physical assistance;+2 for safety/equipment   Upper Body Dressing : Maximal assistance;Sitting  Lower Body Dressing: Bed level;Maximal assistance Lower Body Dressing Details (indicate cue type and reason): to don  socks Toilet Transfer: Maximal assistance;+2 for physical assistance;+2 for safety/equipment Toilet Transfer Details (indicate cue type and reason): STS only         Functional mobility during ADLs: Maximal assistance;+2 for physical assistance;+2 for safety/equipment       Vision Patient Visual Report: No change from baseline Vision Assessment?: Vision impaired- to be further tested in functional context Additional Comments: suspect L visual field deficit, Pt with R gaze preference, needing max compensatory techniques and mod cues to locate objects and items on L     Perception Perception: Impaired Preception Impairment Details: Inattention/Neglect     Praxis Praxis: Impaired Praxis Impairment Details:  (pushes L)     Pertinent Vitals/Pain Pain Assessment Pain Assessment: Faces Faces Pain Scale: No hurt     Extremity/Trunk Assessment Upper Extremity Assessment Upper Extremity Assessment: LUE deficits/detail;Right hand dominant LUE Deficits / Details: no active movement and no response to painful stimuli. questionably developing sublux   Lower Extremity Assessment Lower Extremity Assessment: Defer to PT evaluation LLE Deficits / Details: No active movement noted, no response to painful stimuli   Cervical / Trunk Assessment Cervical / Trunk Assessment: Normal   Communication Communication Communication: Impaired Factors Affecting Communication: Reduced clarity of speech   Cognition Arousal: Alert Behavior During Therapy: Flat affect Cognition: Cognition impaired   Orientation impairments: Time, Place, Situation (aware L side not at baseline, but was not aware he has brain bleed) Awareness: Intellectual awareness impaired, Online awareness impaired Memory impairment (select all impairments): Short-term memory, Working memory Attention impairment (select first level of impairment): Sustained attention                     Following commands:  Impaired Following commands impaired: Follows one step commands with increased time, Follows one step commands inconsistently     Cueing  General Comments   Cueing Techniques: Verbal cues;Gestural cues;Tactile cues      Exercises Exercises: Other exercises Other Exercises Other Exercises: Supine: bridging x 3 with LLE support   Shoulder Instructions      Home Living Family/patient expects to be discharged to:: Private residence                                 Additional Comments: Pt unable to provide      Prior Functioning/Environment Prior Level of Function : Independent/Modified Independent             Mobility Comments: 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 [employer]      OT Problem List: Decreased strength;Impaired balance (sitting and/or standing);Decreased cognition;Decreased coordination;Impaired vision/perception;Decreased range of motion;Decreased activity tolerance;Decreased safety awareness;Decreased knowledge of use of DME or AE;Impaired tone;Impaired sensation   OT Treatment/Interventions: Self-care/ADL training;Therapeutic exercise;DME and/or AE instruction;Therapeutic activities;Patient/family education;Balance training      OT Goals(Current goals can be found in the care plan section)   Acute Rehab OT Goals OT Goal Formulation: With patient Time For Goal Achievement: 01/01/24 Potential to Achieve Goals: Good   OT Frequency:  Min 2X/week    Co-evaluation PT/OT/SLP Co-Evaluation/Treatment: Yes Reason for Co-Treatment: Complexity of the patient's impairments (multi-system involvement);For patient/therapist safety;To address functional/ADL transfers;Necessary to address cognition/behavior during functional activity PT goals addressed during session: Mobility/safety with mobility;Balance OT goals addressed during session: ADL's and self-care      AM-PAC OT "6 Clicks" Daily  Activity     Outcome Measure Help from another person eating  meals?: Total Help from another person taking care of personal grooming?: A Lot Help from another person toileting, which includes using toliet, bedpan, or urinal?: Total Help from another person bathing (including washing, rinsing, drying)?: A Lot Help from another person to put on and taking off regular upper body clothing?: A Lot Help from another person to put on and taking off regular lower body clothing?: Total 6 Click Score: 9   End of Session Equipment Utilized During Treatment: Gait belt Nurse Communication: Mobility status  Activity Tolerance: Patient tolerated treatment well Patient left: in bed;with call bell/phone within reach;with bed alarm set  OT Visit Diagnosis: Unsteadiness on feet (R26.81);Muscle weakness (generalized) (M62.81);Low vision, both eyes (H54.2);Other symptoms and signs involving cognitive function;Cognitive communication deficit (R41.841);Hemiplegia and hemiparesis Symptoms and signs involving cognitive functions: Nontraumatic intracerebral hemorrhage Hemiplegia - Right/Left: Left Hemiplegia - caused by: Nontraumatic intracerebral hemorrhage                Time: 9604-5409 OT Time Calculation (min): 27 min Charges:  OT General Charges $OT Visit: 1 Visit OT Evaluation $OT Eval Moderate Complexity: 1 Mod  Karilyn Ouch, OTR/L [facility] Acute Rehabilitation Office: [phone]   Emery Hans 12/18/2023, 3:09 PM

## 2023-12-18 NOTE — Progress Notes (Signed)
 PT Cancellation Note  Patient Details Name: Troy Bishop MRN: [id number] DOB: [birth year]   Cancelled Treatment:    Reason Eval/Treat Not Completed: Other (comment) RN requesting to hold- trying to decrease of cleviprex   Verdia Glad, PT, DPT Acute Rehabilitation Services Office [phone]    Claria Crofts 12/18/2023, 8:53 AM

## 2023-12-18 NOTE — Procedures (Signed)
 Routine EEG Report  Troy Bishop is a 33 y.o. male with a history of R ICH and AMS who is undergoing an EEG to evaluate for seizures.  Report: This EEG was acquired with electrodes placed according to the International 10-20 electrode system (including Fp1, Fp2, F3, F4, C3, C4, P3, P4, O1, O2, T3, T4, T5, T6, A1, A2, Fz, Cz, Pz). The following electrodes were missing or displaced: none.  The occipital dominant rhythm was 8.5 Hz. This activity is reactive to stimulation. Drowsiness was manifested by background fragmentation; deeper stages of sleep were not identified. There was focal slowing over the right hemisphere. There were no interictal epileptiform discharges. There were no electrographic seizures identified. Photic stimulation and hyperventilation were not performed.  Impression and clinical correlation: This EEG was obtained while awake and drowsy and is abnormal due to R focal slowing over the region of intracerebral hemorrhage. Epileptiform abnormalities were not seen during this recording.  Greg Leaks, MD [facility] [phone]  If 7pm- 7am, please page neurology on call as listed in AMION.

## 2023-12-18 NOTE — Progress Notes (Addendum)
 Pharmacy Electrolyte Replacement  Recent Labs:  Recent Labs    12/17/23 2043 12/18/23 0124  K 3.2*  --   MG  --  1.6*  CREATININE 0.75  --     Low Critical Values (K </= 2.5, Phos </= 1, Mg </= 1) Present: Mg = 1.6  MD Contacted: Consulted to replace when below 2  Plan: 4g IV mag sulfate x1 recheck mag ~1300 on 5/12  Young Hensen, PharmD, BCPS Clinical Pharmacist 12/18/2023 2:00 AM

## 2023-12-18 NOTE — Progress Notes (Signed)
 Patient ID: Troy Bishop, male   DOB: 1991-06-12, 33 y.o.   MRN: [id number] Yet another head CT due to patient vomiting. There is still no change in the hematoma. Unlikely at this point to cause any changes. Will sign off

## 2023-12-18 NOTE — Progress Notes (Signed)
 NAMEFlournoy Bishop, MRN:  [id number], DOB:  11-28-1990, LOS: 2 ADMISSION DATE:  12/16/2023, CONSULTATION DATE:  12/16/2023 REFERRING MD: Stroke - MD, CHIEF COMPLAINT: ICH with evolving EtOH withdrawal  History of Present Illness:  Troy Bishop is a 33 year old male with past medical history significant for EtOH use and liver disease who scented to the ED at [facility] 5/9 after syncopal episode with left-sided weakness.  Presented as code stroke.  Code stroke CT revealed a large right IPH with no significant midline shift.  CTA head and neck concerning for contrast extravasation/spots on for which neurosurgery was consulted.  Patient was admitted to neuro ICU for close monitoring under the care of neurology and neurosurgery.  Mid morning of 5/10 patient became recently agitated with tachycardia additional medical history was obtained and patient's family reports patient drinks alcohol daily.  Concern for evolving alcohol withdrawal for which PCCM was consulted.  Pertinent  Medical History  Liver disease Alcohol use  Significant Hospital Events: Including procedures, antibiotic start and stop dates in addition to other pertinent events   5/9 presented after syncopal episode with left-sided weakness noted, code stroke CT confirmed IPH.  CTA head and neck concern for contrast dissociation/, neurosurgery consulted with plans to closely observe  5/10 neurologically remains able with repeat CT unchanged with right cerebral hematoma with 6 mm midline shift.  Concerns for evolving EtOH withdrawal 5/11 waxing and waning agitation/withdrawal symptoms  Interim History / Subjective:  No events Near comatose on precedex + phenobarb Started on hypertonic saline for ?dropping sodiums  Objective    Blood pressure (!) 137/100, pulse 77, temperature (!) 101.2 F (38.4 C), temperature source Axillary, resp. rate 15, weight 63.4 kg, SpO2 93%.        Intake/Output Summary (Last 24 hours) at 12/18/2023 0810 Last  data filed at 12/18/2023 0700 Gross per 24 hour  Intake 3947.63 ml  Output 1700 ml  Net 2247.63 ml   Filed Weights   12/16/23 0000  Weight: 63.4 kg    Examination:  Somnolent Pupils small/equal Speech slightly slurred when prompted enough L weaker than R Abd soft, hypoactive BS No doll's?   Assessment & Plan:  Nontraumatic intracerebral hematoma with brain compression- presumably HTN? Essential hypertension Hyperlipidemia Alcoholic hepatitis, likely underlying cirrhosis Leukopenia Thrombocytopenia of sequestration Fevers- no obvious source query central  - Hypertonic saline discussion with neuro: ? Goal - Wean off precedex, continue phenobarb taper - Thiamine  to  treatment dose then taper - Airway watch - Wean off cleviprex - Swallow screen later, if fails needs cortrak - PT/OT/SLP - Maintain SBP < 160 - Check Pct, monitor fever curve, stop APAP given cirrhosis, transaminitis - Check CMP, may need to hold phenobarb and switch to benzo if going up - Recheck INR - Recheck ammonia - Statin on hold with elevated LFTs  Best Practice (right click and "Reselect all SmartList Selections" daily)  Per primary   Critical care time:   CRITICAL CARE Performed by: Josiah Nigh   Total critical care time: 32 minutes  Critical care time was exclusive of separately billable procedures and treating other patients.  Critical care was necessary to treat or prevent imminent or life-threatening deterioration.  Critical care was time spent personally by me on the following activities: development of treatment plan with patient and/or surrogate as well as nursing, discussions with consultants, evaluation of patient's response to treatment, examination of patient, obtaining history from patient or surrogate, ordering and performing treatments and interventions, ordering and  review of laboratory studies, ordering and review of radiographic studies, pulse oximetry and re-evaluation of  patient's condition.  Whitney D. Harris, NP-C [facility] Personal contact information can be found on Amion  If no contact or response made please call [phone] 12/18/2023, 8:10 AM

## 2023-12-19 ENCOUNTER — Inpatient Hospital Stay (HOSPITAL_COMMUNITY)

## 2023-12-19 DIAGNOSIS — G936 Cerebral edema: Secondary | ICD-10-CM | POA: Diagnosis not present

## 2023-12-19 DIAGNOSIS — K7030 Alcoholic cirrhosis of liver without ascites: Secondary | ICD-10-CM | POA: Diagnosis not present

## 2023-12-19 DIAGNOSIS — I10 Essential (primary) hypertension: Secondary | ICD-10-CM | POA: Diagnosis not present

## 2023-12-19 DIAGNOSIS — I619 Nontraumatic intracerebral hemorrhage, unspecified: Secondary | ICD-10-CM | POA: Diagnosis not present

## 2023-12-19 DIAGNOSIS — I69391 Dysphagia following cerebral infarction: Secondary | ICD-10-CM | POA: Diagnosis not present

## 2023-12-19 DIAGNOSIS — E871 Hypo-osmolality and hyponatremia: Secondary | ICD-10-CM

## 2023-12-19 DIAGNOSIS — R509 Fever, unspecified: Secondary | ICD-10-CM | POA: Diagnosis not present

## 2023-12-19 DIAGNOSIS — I629 Nontraumatic intracranial hemorrhage, unspecified: Secondary | ICD-10-CM | POA: Diagnosis not present

## 2023-12-19 LAB — COMPREHENSIVE METABOLIC PANEL WITH GFR
ALT: 118 U/L — ABNORMAL HIGH (ref 0–44)
AST: 106 U/L — ABNORMAL HIGH (ref 15–41)
Albumin: 3.3 g/dL — ABNORMAL LOW (ref 3.5–5.0)
Alkaline Phosphatase: 71 U/L (ref 38–126)
Anion gap: 14 (ref 5–15)
BUN: 10 mg/dL (ref 6–20)
CO2: 17 mmol/L — ABNORMAL LOW (ref 22–32)
Calcium: 8.6 mg/dL — ABNORMAL LOW (ref 8.9–10.3)
Chloride: 106 mmol/L (ref 98–111)
Creatinine, Ser: 0.74 mg/dL (ref 0.61–1.24)
GFR, Estimated: >60 mL/min (ref >60)
Glucose, Bld: 106 mg/dL — ABNORMAL HIGH (ref 70–99)
Potassium: 3 mmol/L — ABNORMAL LOW (ref 3.5–5.1)
Sodium: 137 mmol/L (ref 135–145)
Total Bilirubin: 2.2 mg/dL — ABNORMAL HIGH (ref 0.0–1.2)
Total Protein: 8.1 g/dL (ref 6.5–8.1)

## 2023-12-19 LAB — CBC
HCT: 46.6 % (ref 39.0–52.0)
Hemoglobin: 16.1 g/dL (ref 13.0–17.0)
MCH: 31.8 pg (ref 26.0–34.0)
MCHC: 34.5 g/dL (ref 30.0–36.0)
MCV: 92.1 fL (ref 80.0–100.0)
Platelets: 46 10*3/uL — ABNORMAL LOW (ref 150–400)
RBC: 5.06 MIL/uL (ref 4.22–5.81)
RDW: 14.6 % (ref 11.5–15.5)
WBC: 5.7 10*3/uL (ref 4.0–10.5)
nRBC: 0 % (ref 0.0–0.2)

## 2023-12-19 LAB — MAGNESIUM: Magnesium: 2.1 mg/dL (ref 1.7–2.4)

## 2023-12-19 LAB — SODIUM
Sodium: 136 mmol/L (ref 135–145)
Sodium: 140 mmol/L (ref 135–145)

## 2023-12-19 LAB — PHOSPHORUS: Phosphorus: 2.9 mg/dL (ref 2.5–4.6)

## 2023-12-19 MED ORDER — HYDRALAZINE HCL 20 MG/ML IJ SOLN
10.0000 mg | INTRAMUSCULAR | Status: DC | PRN
  Administered 2023-12-20: 20 mg via INTRAVENOUS
  Administered 2023-12-20 – 2023-12-21 (×2): 10 mg via INTRAVENOUS
  Filled 2023-12-19 (×3): qty 1

## 2023-12-19 MED ORDER — QUETIAPINE FUMARATE 25 MG PO TABS
50.0000 mg | ORAL_TABLET | Freq: Two times a day (BID) | ORAL | Status: DC
  Administered 2023-12-19 – 2023-12-21 (×6): 50 mg via ORAL
  Filled 2023-12-19 (×6): qty 2

## 2023-12-19 MED ORDER — PHENOBARBITAL 32.4 MG PO TABS
32.4000 mg | ORAL_TABLET | Freq: Two times a day (BID) | ORAL | Status: DC
  Administered 2023-12-19 – 2023-12-20 (×3): 32.4 mg via ORAL
  Filled 2023-12-19 (×3): qty 1

## 2023-12-19 MED ORDER — POTASSIUM CHLORIDE CRYS ER 20 MEQ PO TBCR
40.0000 meq | EXTENDED_RELEASE_TABLET | ORAL | Status: AC
  Administered 2023-12-19 (×2): 40 meq via ORAL
  Filled 2023-12-19 (×2): qty 2

## 2023-12-19 MED ORDER — CARVEDILOL 3.125 MG PO TABS
6.2500 mg | ORAL_TABLET | Freq: Two times a day (BID) | ORAL | Status: DC
  Administered 2023-12-19 (×2): 6.25 mg via ORAL
  Filled 2023-12-19 (×2): qty 2

## 2023-12-19 MED ORDER — GADOBUTROL 1 MMOL/ML IV SOLN
6.0000 mL | Freq: Once | INTRAVENOUS | Status: AC | PRN
  Administered 2023-12-19: 6 mL via INTRAVENOUS

## 2023-12-19 MED ORDER — LABETALOL HCL 5 MG/ML IV SOLN
10.0000 mg | INTRAVENOUS | Status: DC | PRN
  Administered 2023-12-21 – 2023-12-22 (×4): 20 mg via INTRAVENOUS
  Filled 2023-12-19 (×3): qty 4

## 2023-12-19 MED ORDER — LACTULOSE 10 GM/15ML PO SOLN
30.0000 g | Freq: Two times a day (BID) | ORAL | Status: DC
  Administered 2023-12-19 – 2023-12-21 (×6): 30 g via ORAL
  Filled 2023-12-19 (×6): qty 45

## 2023-12-19 NOTE — Progress Notes (Signed)
 STROKE TEAM PROGRESS NOTE   INTERIM HISTORY/SUBJECTIVE Pt RN is at the bedside. Pt awake alert but more agitated than yesterday and not fully orientated, more consistent with alcohol withdraw. Overnight MRI showed stable hematoma and MLS.   OBJECTIVE  CBC    Component Value Date/Time   WBC 5.7 12/19/2023 0537   RBC 5.06 12/19/2023 0537   HGB 16.1 12/19/2023 0537   HCT 46.6 12/19/2023 0537   PLT 46 (L) 12/19/2023 0537   MCV 92.1 12/19/2023 0537   MCH 31.8 12/19/2023 0537   MCHC 34.5 12/19/2023 0537   RDW 14.6 12/19/2023 0537   LYMPHSABS 0.9 12/16/2023 0024   MONOABS 0.6 12/16/2023 0024   EOSABS 0.1 12/16/2023 0024   BASOSABS 0.0 12/16/2023 0024    BMET    Component Value Date/Time   NA 137 12/19/2023 0537   K 3.0 (L) 12/19/2023 0537   CL 106 12/19/2023 0537   CO2 17 (L) 12/19/2023 0537   GLUCOSE 106 (H) 12/19/2023 0537   BUN 10 12/19/2023 0537   CREATININE 0.74 12/19/2023 0537   CALCIUM 8.6 (L) 12/19/2023 0537   GFRNONAA >60 12/19/2023 0537    IMAGING past 24 hours MR BRAIN W WO CONTRAST Result Date: 12/19/2023 CLINICAL DATA:  Hemorrhagic stroke EXAM: MRI HEAD WITHOUT AND WITH CONTRAST TECHNIQUE: Multiplanar, multiecho pulse sequences of the brain and surrounding structures were obtained without and with intravenous contrast. CONTRAST:  6mL GADAVIST GADOBUTROL 1 MMOL/ML IV SOLN COMPARISON:  12/17/2023 FINDINGS: Brain: Massive intracranial hemorrhage centered in the right basal ganglia, extending into the anterior right temporal lobe is unchanged. There is abnormal diffusion restriction along the periphery of the hemorrhage. There is multifocal hyperintense T2-weighted signal within the white matter. Parenchymal volume and CSF spaces are normal. Extensive edema surrounding the hemorrhage in the right hemisphere. 5 mm of leftward midline shift. There is no abnormal contrast enhancement. Vascular: Normal flow voids. Skull and upper cervical spine: Normal calvarium and skull base.  Visualized upper cervical spine and soft tissues are normal. Sinuses/Orbits:No paranasal sinus fluid levels or advanced mucosal thickening. No mastoid or middle ear effusion. Normal orbits. IMPRESSION: 1. Unchanged massive intracranial hemorrhage centered in the right basal ganglia, extending into the anterior right temporal lobe. Peripheral diffusion restriction, consistent with hemorrhagic ischemic infarct. 2. No abnormal contrast enhancement. Electronically Signed   By: Juanetta Nordmann M.D.   On: 12/19/2023 03:08    Vitals:   12/19/23 0900 12/19/23 0915 12/19/23 0930 12/19/23 1140  BP: (!) 135/91 129/83 (!) 134/94   Pulse: 88 85 89   Resp: (!) 25 (!) 25 (!) 27   Temp:    99.3 F (37.4 C)  TempSrc:    Axillary  SpO2: 99% 98% 99%   Weight:         PHYSICAL EXAM General: Ill-appearing patient in no acute distress, but more agitated and restless Psych: Affect blunted CV: Regular, tachycardic rate and rhythm on monitor Respiratory:  Regular, unlabored respirations on room air  NEURO:  Pt awake, alert, eyes open, orientated to time and age but not to place. No aphasia but paucity of speech with severe dysarthria, able to follow all simple commands and answer all orientation questions. Able to name and repeat in severely dysarthric voice. No gaze palsy, tracking bilaterally, however, left hemianopia vs. Visual neglect. Left asomatognosia. Left facial droop. Tongue midline. RUE and RLE 5/5, LUE flaccid with LLE 2/5 the most. Sensation symmetrical bilaterally subjectively but left sensory neglect, right FTN intact, gait not tested.  ASSESSMENT/PLAN  [name] is a 33 y.o. male with history of hypertension, seizure and daily alcohol use admitted for acute onset left-sided weakness.  He was found to have a large right subcortical ICH.  On later CT angiogram, focus of contrast-enhancement within cerebral hematoma was found, and repeat CT demonstrated enlargement of ICH.  He received platelet  transfusion for platelet count of 54.  However, follow-up CT in the morning of 5/10 revealed stable ICH.  Neurosurgery was consulted but has declined to intervene.  Cleviprex was used for blood pressure control, and hypertonic saline was initiated to decrease edema.  In the morning of 5/10, patient became restless, agitated, diaphoretic and developed nausea.  He reports that he has 2-3 drinks of alcohol daily, and phenobarbital taper along with Precedex drip were initiated to treat alcohol withdrawal.  NIH on Admission 18, ICH score 1  ICH:  right BG large ICH, etiology: Likely hypertensive Code Stroke CT head 35 mm ICH in right frontotemporal region CTA head & neck focus of contrast enhancement within bed of right cerebral hematoma, no other underlying vascular abnormality Repeat CT head interval increase in size of right cerebral hemorrhage, now estimated volume 52 mL Repeat CT head 5/10 unchanged right cerebral hematoma with 6 mm of midline shift MRI stable hematoma and MLS, no abnormal enhancement  2D Echo EF 60 to 65% LDL 124 HgbA1c 5.6 UDS positive for barbiturates [facility] medication) VTE prophylaxis -heparin subcu No antithrombotic prior to admission, now on No antithrombotic secondary to ICH Therapy recommendations: CIR Disposition: Pending  Cerebral edema Large right ICH with 6 mm midline shift noted on CT 5/10 and 5/11 Continue 3% hypertonic saline at 75 cc/h Monitor sodium every 6 hours  Na 142-> 138->132->136->138->137 MRI stable hematoma and MLS  Hypertension Home meds: None Still on Cleviprex, taper off as able As needed hydralazine and labetalol On amlodipine 10 mg daily Add cozaar 50->100 Add coreg 6.25 bid BP goal less than 160 Long-term BP goal normotensive  Hyperlipidemia Home meds: None LDL 124, goal < 70 Consider statin once LFT normalizes  Alcohol withdrawal Patient states he drinks 2-3 drinks per day EtOH level on admission 264 Patient noted to be  tachycardic, diaphoretic, agitated, restless with nausea and vomiting on 5/10 CCM consulted and phenobarbital taper initiated for EtOH withdrawal Patient requiring IV Precedex for withdrawal related agitation  Thrombocytopenia Elevated LFTs Platelet 56--60--49--46 Status post platelet transfusion x 1 Likely related to chronic alcohol use CBC monitoring AST/ALT 146/144--114/119--106/118   Fever, likely central fever Tmax 101.2-- 101.1--100 CXR negative UA negative Monitor fever curve  Other Stroke Risk Factors   Other Active Problems Hypokalemia, K3.2--3.7--3.0 - supplement  Hospital day # 3  This patient is critically ill due to large ICH, alcohol withdrawal, thrombocytopenia, elevated LFTs and fever and at significant risk of neurological worsening, death form brain herniation, hematoma expansion, DT, liver failure, bleeding from thrombocytopenia. This patient's care requires constant monitoring of vital signs, hemodynamics, respiratory and cardiac monitoring, review of multiple databases, neurological assessment, discussion with family, other specialists and medical decision making of high complexity. I spent 40 minutes of neurocritical care time in the care of this patient.  Consuelo Denmark, MD PhD Stroke Neurology 12/19/2023 11:59 AM   To contact Stroke Continuity provider, please refer to [email or web address]. After hours, contact General Neurology

## 2023-12-19 NOTE — Progress Notes (Signed)
   NAMEUra Bishop, MRN:  [id number], DOB:  Oct 26, 1990, LOS: 3 ADMISSION DATE:  12/16/2023, CONSULTATION DATE:  12/16/2023 REFERRING MD: Stroke - MD, CHIEF COMPLAINT: ICH with evolving EtOH withdrawal  History of Present Illness:  Troy Bishop is a 33 year old male with past medical history significant for EtOH use and liver disease who scented to the ED at [facility] 5/9 after syncopal episode with left-sided weakness.  Presented as code stroke.  Code stroke CT revealed a large right IPH with no significant midline shift.  CTA head and neck concerning for contrast extravasation/spots on for which neurosurgery was consulted.  Patient was admitted to neuro ICU for close monitoring under the care of neurology and neurosurgery.  Mid morning of 5/10 patient became recently agitated with tachycardia additional medical history was obtained and patient's family reports patient drinks alcohol daily.  Concern for evolving alcohol withdrawal for which PCCM was consulted.  Pertinent  Medical History  Liver disease Alcohol use   Significant Hospital Events: Including procedures, antibiotic start and stop dates in addition to other pertinent events   5/9 presented after syncopal episode with left-sided weakness noted, code stroke CT confirmed IPH.  CTA head and neck concern for contrast dissociation/, neurosurgery consulted with plans to closely observe  5/10 neurologically remains able with repeat CT unchanged with right cerebral hematoma with 6 mm midline shift.  Concerns for evolving EtOH withdrawal 5/11 waxing and waning agitation/withdrawal symptoms  Interim History / Subjective:  More awake today Remains on hypertonic Polyuric Wants food Passed swallow  Objective    Blood pressure (!) 155/101, pulse (!) 108, temperature 98.8 F (37.1 C), temperature source Oral, resp. rate (!) 21, weight 63.4 kg, SpO2 98%.        Intake/Output Summary (Last 24 hours) at 12/19/2023 0802 Last data filed at  12/19/2023 0715 Gross per 24 hour  Intake 1764.09 ml  Output 5025 ml  Net -3260.91 ml   Filed Weights   12/16/23 0000  Weight: 63.4 kg    Examination:  Moves R briskly, left lower some, and minimal left upper Talking okay, intermittently confused, knows name Abd soft, +BS Lungs sounds okay A little restless on precedex Ext without edema  K repleted Unclear why bicarb remains low, will watch for now LFTs stable Thrombocytopenia stable  Assessment & Plan:  Nontraumatic intracerebral hematoma with brain compression- presumably HTN Essential hypertension Hyperlipidemia Alcoholic hepatitis, likely underlying cirrhosis Hyponatremia- if liver disease has progressed he could have a deranged ADH axis and lower baseline Na, discussed with neurology, keep normonatremic for t+7 days from event Leukopenia Thrombocytopenia of sequestration Fevers- no obvious source query central; looks improved, Pct neg  - Hypertonic saline discussion with neuro for 7 days from event - Wean off precedex, continue phenobarb taper; add seroquel - Thiamine  to  treatment dose then taper - Wean off cleviprex; coreg added to amlodipine and ARB.  Maintain SBP < 160. - Swallow screen later, if fails needs cortrak - PT/OT Heparin dvt ppx  32 min cc time  Ardelle Kos MD PCCM [facility] Personal contact information can be found on Amion  If no contact or response made please call [phone] 12/19/2023, 8:02 AM

## 2023-12-19 NOTE — Progress Notes (Signed)
  Inpatient Rehabilitation Admissions Coordinator   Met with patient and wife at bedside for rehab assessment. We discussed goals and expectations of a possible CIR admit. Prior to admit patient living with wife and their 4 children. He worked in [employer], [employer]. Wife can provide 24/7 assist. I will await further progress with therapy to assist with planning eventual rehab needs. Please call me with any questions.   Jeannetta Millman, RN, MSN Rehab Admissions Coordinator [phone]

## 2023-12-19 NOTE — Progress Notes (Signed)
 Physical Therapy Treatment Patient Details Name: Troy Bishop MRN: [id number] DOB: 02-26-1991 Today's Date: 12/19/2023   History of Present Illness Pt is a 33 y.o. male presenting 5/10 after sudden onset L weakness and collapse. CTH with large L subcortical hemorrhage, repeat CT 1 hour later showing progression of ICH from 35mL to 52mL with small volume hemorrhage within the  right lateral ventricle, consistent with intraventricular extension. PMH significant of uncontrolled HTN, cirrhosis due to alcohol abuse, thrombocytopenia    PT Comments  Pt declining getting out of bed due to dizziness (BP stable). Pt with muscle activation ~1/5 of L hip and glute; able to perform supine bridging with LLE supported. Worked on rolling to L and functional reaching towards left to promote awareness to L side. Continues with heavy left lateral lean when trunk is unsupported in chair position. Would benefit from high intensity rehab based on age and PLOF.     If plan is discharge home, recommend the following: Two people to help with walking and/or transfers;Two people to help with bathing/dressing/bathroom   Can travel by private vehicle        Equipment Recommendations  Other (comment) (TBA)    Recommendations for Other Services       Precautions / Restrictions Precautions Precautions: Fall;Other (comment) Recall of Precautions/Restrictions: Impaired Precaution/Restrictions Comments: L hemi, neglect, rectal pouch Restrictions Weight Bearing Restrictions Per Provider Order: No     Mobility  Bed Mobility Overal bed mobility: Needs Assistance Bed Mobility: Rolling Rolling: Min assist         General bed mobility comments: Rolling towards L with min assist with hand over hand guidance for locating bed rail    Transfers                   General transfer comment: deferred by pt    Ambulation/Gait                   Stairs             Wheelchair Mobility     Tilt  Bed    Modified Rankin (Stroke Patients Only) Modified Rankin (Stroke Patients Only) Pre-Morbid Rankin Score: No symptoms Modified Rankin: Severe disability     Balance                                            Communication Communication Communication: Impaired Factors Affecting Communication: Reduced clarity of speech  Cognition Arousal: Alert Behavior During Therapy: Flat affect, Restless   PT - Cognitive impairments: Difficult to assess Difficult to assess due to: Impaired communication                     PT - Cognition Comments: Pt perseverating on needing to use the bathroom; reminded pt of catheter and rectal pouch. Decreased attention and awareness Following commands: Impaired Following commands impaired: Follows one step commands inconsistently    Cueing    Exercises Other Exercises Other Exercises: Supine: PROM LUE D1/2, LLE D1  x10 each, bridging x 20 (with LLE support) Other Exercises: Bed in chair position: modified pull ups x 3 Other Exercises: Bed in chair position: functional reaching with RUE towards left    General Comments        Pertinent Vitals/Pain Pain Assessment Pain Assessment: Faces Faces Pain Scale: Hurts a little bit Pain Location: LUE (pointing to IV  site) Pain Descriptors / Indicators: Discomfort Pain Intervention(s): Monitored during session, Repositioned    Home Living                          Prior Function            PT Goals (current goals can now be found in the care plan section) Acute Rehab PT Goals Patient Stated Goal: did not state Potential to Achieve Goals: Good    Frequency    Min 3X/week      PT Plan      Co-evaluation              AM-PAC PT "6 Clicks" Mobility   Outcome Measure  Help needed turning from your back to your side while in a flat bed without using bedrails?: A Lot Help needed moving from lying on your back to sitting on the side of a flat bed  without using bedrails?: Total Help needed moving to and from a bed to a chair (including a wheelchair)?: Total Help needed standing up from a chair using your arms (e.g., wheelchair or bedside chair)?: Total Help needed to walk in hospital room?: Total Help needed climbing 3-5 steps with a railing? : Total 6 Click Score: 7    End of Session   Activity Tolerance: Patient tolerated treatment well Patient left: in bed;with call bell/phone within reach;with bed alarm set Nurse Communication: Mobility status PT Visit Diagnosis: Hemiplegia and hemiparesis;Unsteadiness on feet (R26.81) Hemiplegia - Right/Left: Left Hemiplegia - dominant/non-dominant: Non-dominant Hemiplegia - caused by: Nontraumatic intracerebral hemorrhage     Time: 0930-1001 PT Time Calculation (min) (ACUTE ONLY): 31 min  Charges:    $Therapeutic Activity: 23-37 mins PT General Charges $$ ACUTE PT VISIT: 1 Visit                     Verdia Glad, PT, DPT Acute Rehabilitation Services Office [phone]    Claria Crofts 12/19/2023, 11:06 AM

## 2023-12-19 NOTE — Progress Notes (Signed)
 Orthopedic Tech Progress Note Patient Details:  Troy Bishop [birth year] [id number]  Ortho Devices Type of Ortho Device: Prafo boot/shoe Ortho Device/Splint Location: LLE Ortho Device/Splint Interventions: Ordered, Application, Adjustmentapplied with family at bedside     Post Interventions Patient Tolerated: Well Instructions Provided: Care of device  [name] 12/19/2023, 1:26 PM

## 2023-12-20 DIAGNOSIS — G9340 Encephalopathy, unspecified: Secondary | ICD-10-CM

## 2023-12-20 DIAGNOSIS — R509 Fever, unspecified: Secondary | ICD-10-CM | POA: Diagnosis not present

## 2023-12-20 DIAGNOSIS — I610 Nontraumatic intracerebral hemorrhage in hemisphere, subcortical: Secondary | ICD-10-CM | POA: Diagnosis not present

## 2023-12-20 DIAGNOSIS — K7030 Alcoholic cirrhosis of liver without ascites: Secondary | ICD-10-CM | POA: Diagnosis not present

## 2023-12-20 DIAGNOSIS — E785 Hyperlipidemia, unspecified: Secondary | ICD-10-CM | POA: Diagnosis not present

## 2023-12-20 DIAGNOSIS — I629 Nontraumatic intracranial hemorrhage, unspecified: Secondary | ICD-10-CM | POA: Diagnosis not present

## 2023-12-20 DIAGNOSIS — I10 Essential (primary) hypertension: Secondary | ICD-10-CM | POA: Diagnosis not present

## 2023-12-20 DIAGNOSIS — G936 Cerebral edema: Secondary | ICD-10-CM | POA: Diagnosis not present

## 2023-12-20 LAB — CBC
HCT: 40.6 % (ref 39.0–52.0)
Hemoglobin: 13.8 g/dL (ref 13.0–17.0)
MCH: 31.6 pg (ref 26.0–34.0)
MCHC: 34 g/dL (ref 30.0–36.0)
MCV: 92.9 fL (ref 80.0–100.0)
Platelets: 46 10*3/uL — ABNORMAL LOW (ref 150–400)
RBC: 4.37 MIL/uL (ref 4.22–5.81)
RDW: 14.2 % (ref 11.5–15.5)
WBC: 4.8 10*3/uL (ref 4.0–10.5)
nRBC: 0 % (ref 0.0–0.2)

## 2023-12-20 LAB — COMPREHENSIVE METABOLIC PANEL WITH GFR
ALT: 126 U/L — ABNORMAL HIGH (ref 0–44)
AST: 122 U/L — ABNORMAL HIGH (ref 15–41)
Albumin: 3.1 g/dL — ABNORMAL LOW (ref 3.5–5.0)
Alkaline Phosphatase: 60 U/L (ref 38–126)
Anion gap: 10 (ref 5–15)
BUN: 7 mg/dL (ref 6–20)
CO2: 20 mmol/L — ABNORMAL LOW (ref 22–32)
Calcium: 8.7 mg/dL — ABNORMAL LOW (ref 8.9–10.3)
Chloride: 109 mmol/L (ref 98–111)
Creatinine, Ser: 0.6 mg/dL — ABNORMAL LOW (ref 0.61–1.24)
GFR, Estimated: >60 mL/min (ref >60)
Glucose, Bld: 100 mg/dL — ABNORMAL HIGH (ref 70–99)
Potassium: 3.3 mmol/L — ABNORMAL LOW (ref 3.5–5.1)
Sodium: 139 mmol/L (ref 135–145)
Total Bilirubin: 2 mg/dL — ABNORMAL HIGH (ref 0.0–1.2)
Total Protein: 7.4 g/dL (ref 6.5–8.1)

## 2023-12-20 LAB — AMMONIA: Ammonia: 47 umol/L — ABNORMAL HIGH (ref 9–35)

## 2023-12-20 LAB — PHOSPHORUS: Phosphorus: 2.8 mg/dL (ref 2.5–4.6)

## 2023-12-20 LAB — SODIUM
Sodium: 137 mmol/L (ref 135–145)
Sodium: 137 mmol/L (ref 135–145)
Sodium: 138 mmol/L (ref 135–145)
Sodium: 138 mmol/L (ref 135–145)

## 2023-12-20 LAB — MAGNESIUM: Magnesium: 1.9 mg/dL (ref 1.7–2.4)

## 2023-12-20 MED ORDER — ACETAMINOPHEN 650 MG RE SUPP: 650.0000 mg | Freq: Four times a day (QID) | RECTAL | Status: DC | PRN

## 2023-12-20 MED ORDER — LORAZEPAM 1 MG PO TABS
0.0000 mg | ORAL_TABLET | ORAL | Status: AC
  Administered 2023-12-20: 4 mg via ORAL
  Administered 2023-12-21 (×5): 2 mg via ORAL
  Filled 2023-12-20 (×3): qty 2
  Filled 2023-12-20 (×2): qty 4

## 2023-12-20 MED ORDER — POTASSIUM CHLORIDE CRYS ER 20 MEQ PO TBCR
40.0000 meq | EXTENDED_RELEASE_TABLET | ORAL | Status: AC
  Administered 2023-12-20 (×3): 40 meq via ORAL
  Filled 2023-12-20 (×3): qty 2

## 2023-12-20 MED ORDER — ACETAMINOPHEN 325 MG PO TABS
650.0000 mg | ORAL_TABLET | Freq: Four times a day (QID) | ORAL | Status: DC | PRN
  Administered 2023-12-20 – 2023-12-29 (×11): 650 mg via ORAL
  Filled 2023-12-20 (×12): qty 2

## 2023-12-20 MED ORDER — LORAZEPAM 1 MG PO TABS
0.0000 mg | ORAL_TABLET | Freq: Three times a day (TID) | ORAL | Status: AC
  Administered 2023-12-22: 3 mg via ORAL
  Administered 2023-12-22: 1 mg via ORAL
  Administered 2023-12-23: 2 mg via ORAL
  Administered 2023-12-23: 1 mg via ORAL
  Administered 2023-12-23: 4 mg via ORAL
  Filled 2023-12-20: qty 2
  Filled 2023-12-20: qty 4
  Filled 2023-12-20: qty 1
  Filled 2023-12-20: qty 3
  Filled 2023-12-20: qty 2

## 2023-12-20 MED ORDER — CARVEDILOL 12.5 MG PO TABS
12.5000 mg | ORAL_TABLET | Freq: Two times a day (BID) | ORAL | Status: DC
  Administered 2023-12-20 – 2023-12-21 (×3): 12.5 mg via ORAL
  Filled 2023-12-20 (×3): qty 1

## 2023-12-20 MED ORDER — ACETAMINOPHEN 160 MG/5ML PO SOLN: 650.0000 mg | Freq: Four times a day (QID) | ORAL | Status: DC | PRN

## 2023-12-20 NOTE — Plan of Care (Signed)
 Pt alert and oriented.  Dysarthric.  Working with PT/OT/SPT.  Flaccid on left.  Tolerating diet.  Anxiety decreased and off of precedex this shift.  Weaned off of Cleveprex and current time with po bp meds.

## 2023-12-20 NOTE — Progress Notes (Addendum)
 STROKE TEAM PROGRESS NOTE   INTERIM HISTORY/SUBJECTIVE  Wife at bedside. Patient c/o headache and neckache. Stable neurological exam.  Cleviprex and Precedex stopped this AM.  Coreg increased to 12.5mg  this AM Continue 3% at 75.   Pending ammonia level for today.   OBJECTIVE  CBC    Component Value Date/Time   WBC 4.8 12/20/2023 0558   RBC 4.37 12/20/2023 0558   HGB 13.8 12/20/2023 0558   HCT 40.6 12/20/2023 0558   PLT 46 (L) 12/20/2023 0558   MCV 92.9 12/20/2023 0558   MCH 31.6 12/20/2023 0558   MCHC 34.0 12/20/2023 0558   RDW 14.2 12/20/2023 0558   LYMPHSABS 0.9 12/16/2023 0024   MONOABS 0.6 12/16/2023 0024   EOSABS 0.1 12/16/2023 0024   BASOSABS 0.0 12/16/2023 0024    BMET    Component Value Date/Time   NA 139 12/20/2023 0558   K 3.3 (L) 12/20/2023 0558   CL 109 12/20/2023 0558   CO2 20 (L) 12/20/2023 0558   GLUCOSE 100 (H) 12/20/2023 0558   BUN 7 12/20/2023 0558   CREATININE 0.60 (L) 12/20/2023 0558   CALCIUM 8.7 (L) 12/20/2023 0558   GFRNONAA >60 12/20/2023 0558    IMAGING past 24 hours No results found.   Vitals:   12/20/23 0450 12/20/23 0500 12/20/23 0600 12/20/23 0732  BP:  119/80 123/81   Pulse: 71 73 77   Resp: 16 17 17    Temp:    (!) 100.9 F (38.3 C)  TempSrc:    Axillary  SpO2: 96% 95% 95%   Weight:         PHYSICAL EXAM General: Ill-appearing patient in no acute distress Psych: Calm and cooperative CV: Regular, tachycardic rate and rhythm on monitor Respiratory:  Regular, unlabored respirations on room air  NEURO:  Oriented to place, self, age No aphasia, severe dysarthria. Able to follow commands and hold conversation. Good attention.  No gaze palsy, left hemianopia Left facial droop Aware of left side, but unable to move LUE LLE: 1/5 knee, no ankle to toe movement Left sensory neglect, right FTN intact, gait not tested.    ASSESSMENT/PLAN  [name] is a 33 y.o. male with history of hypertension, seizure and daily  alcohol use admitted for acute onset left-sided weakness.  He was found to have a large right subcortical ICH.  On later CT angiogram, focus of contrast-enhancement within cerebral hematoma was found, and repeat CT demonstrated enlargement of ICH.  He received platelet transfusion for platelet count of 54.  However, follow-up CT in the morning of 5/10 revealed stable ICH.  Neurosurgery was consulted but has declined to intervene.  Cleviprex was used for blood pressure control, and hypertonic saline was initiated to decrease edema.  In the morning of 5/10, patient became restless, agitated, diaphoretic and developed nausea.  He reports that he has 2-3 drinks of alcohol daily, and phenobarbital taper along with Precedex drip were initiated to treat alcohol withdrawal.  NIH on Admission 18, ICH score 1  ICH:  right BG large ICH, etiology: Likely hypertensive Code Stroke CT head 35 mm ICH in right frontotemporal region CTA head & neck focus of contrast enhancement within bed of right cerebral hematoma, no other underlying vascular abnormality Repeat CT head interval increase in size of right cerebral hemorrhage, now estimated volume 52 mL Repeat CT head 5/10 unchanged right cerebral hematoma with 6 mm of midline shift MRI stable hematoma and MLS, no abnormal enhancement  2D Echo EF 60 to 65%  LDL 124 HgbA1c 5.6 UDS positive for barbiturates [facility] medication) VTE prophylaxis - continue heparin subcu No antithrombotic prior to admission, continue No antithrombotic secondary to ICH Therapy recommendations: CIR Disposition: Pending  Cerebral edema Large right ICH with 6 mm midline shift noted on CT 5/10 and 5/11 Continue 3% hypertonic saline at 75 cc/h -> consider weaning down tomorrow if neuro stable Monitor sodium every 6 hours  Na 142-> 138->132->136->138->137 MRI stable hematoma and MLS  Hypertension Home meds: None Still on Cleviprex, taper off as able As needed hydralazine and  labetalol On amlodipine 10 mg daily Add cozaar 50->100 Add coreg 6.25 bid--12.5mg  BID increased this AM  BP goal less than 160 Long-term BP goal normotensive  Hyperlipidemia Home meds: None LDL 124, goal < 70 Consider statin once LFT normalizes  Alcohol withdrawal Patient states he drinks 2-3 drinks per day EtOH level on admission 264 Patient noted to be tachycardic, diaphoretic, agitated, restless with nausea and vomiting on 5/10 CCM consulted and phenobarbital taper initiated for EtOH withdrawal Precedex stopped, CIWA protocol Ativan  in place.  ETOH abuse cessation discussed this morning with patient and wife at bedside.   Thrombocytopenia Elevated LFTs Platelet 56--60--49--46-46 Status post platelet transfusion x 1 Likely related to chronic alcohol use CBC monitoring AST/ALT 146/144--114/119--106/118--122/126  Fever, likely central fever Tmax 101.2-- 101.1--100-100.9 CXR negative UA negative Monitor fever curve  Other Active Problems Hypokalemia, K3.2--3.7--3.0->3.3 - supplement  Hospital day # 4   Pt seen by Neuro NP/APP and later by MD. Note/plan to be edited by MD as needed.    Troy Lease, DNP [facility] Please use AMION for contact information & EPIC for messaging.  ATTENDING NOTE: I reviewed above note and agree with the assessment and plan. Pt was seen and examined.   Wife at bedside.  Patient lying bed, less agitated as yesterday, more calm.  Orientated to place, age and people.  Still has left sided hemiplegia and sensory loss.  On 3% saline, however sodium not at goal, but would not chase the numbers now given that he is neuro stable.  Will consider taper down 3% tomorrow if neuro stable.  LFT continue to be abnormal, likely at baseline.  Platelet low but stabilized at 46.  Still has central fever but improving.  BP improved, off Cleviprex.  PT and OT recommend CIR.  For detailed assessment and plan, please refer to above as I have made  changes wherever appropriate.   Troy Denmark, MD PhD Stroke Neurology 12/20/2023 6:04 PM  This patient is critically ill due to large ICH, alcohol withdrawal, thrombocytopenia, elevated LFTs and fever and at significant risk of neurological worsening, death form brain herniation, hematoma expansion, DT, liver failure, bleeding from thrombocytopenia. This patient's care requires constant monitoring of vital signs, hemodynamics, respiratory and cardiac monitoring, review of multiple databases, neurological assessment, discussion with family, other specialists and medical decision making of high complexity. I spent 40 minutes of neurocritical care time in the care of this patient.  I had long discussion with wife at bedside, updated pt current condition, treatment plan and potential prognosis, and answered all the questions.  She expressed understanding and appreciation.    To contact Stroke Continuity provider, please refer to [email or web address]. After hours, contact General Neurology
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
## 2023-12-20 NOTE — Progress Notes (Signed)
 eLink Physician-Brief Progress Note Patient Name: Troy Bishop DOB: 1991-05-30 MRN: [id number]   Date of Service  12/20/2023  HPI/Events of Note  Patient needs a right wrist restraint order added to his Posey belt order to keep him from interfering with his medical treatment.  eICU Interventions  Right soft wrist restraint order entered.        Delmon Andrada U Pietrina Jagodzinski 12/20/2023, 9:55 PM

## 2023-12-20 NOTE — TOC CM/SW Note (Signed)
 Transition of Care [facility]) - Inpatient Brief Assessment   Patient Details  Name: Scotland Carabetta MRN: [id number] Date of Birth: [birth year]  [facility] CM/SW Contact:    Jannice Mends, LCSW Phone Number: 12/20/2023, 1:35 PM   Clinical Narrative: Patient admitted from home with spouse and children. TOC following for discharge needs including substance use resources. No PCP listed.    Transition of Care Asessment: Insurance and Status: Insurance coverage has been reviewed Patient has primary care physician: No Home environment has been reviewed: From home Prior level of function:: Independent Prior/Current Home Services: No current home services Social Drivers of Health Review: SDOH reviewed no interventions necessary Readmission risk has been reviewed: Yes Transition of care needs: transition of care needs identified, TOC will continue to follow

## 2023-12-20 NOTE — Progress Notes (Signed)
 Physical Therapy Treatment Patient Details Name: Troy Bishop MRN: [id number] DOB: November 09, 1990 Today's Date: 12/20/2023   History of Present Illness Pt is a 33 y.o. male presenting 5/10 after sudden onset L weakness and collapse. CTH with large L subcortical hemorrhage, repeat CT 1 hour later showing progression of ICH from 35mL to 52mL with small volume hemorrhage within the  right lateral ventricle, consistent with intraventricular extension. PMH significant of uncontrolled HTN, cirrhosis due to alcohol abuse, thrombocytopenia    PT Comments  Pt agreeable to participate in therapy with spouse at bedside. Continues with lateropulsion towards left with trunk unsupported and left neglect. Able to cross midline with cueing and locate objects on L with increased time. Utilized Stedy for pre transfer training and to promote BLE weightbearing. Pt able to bear weight through LLE today, but unable to achieve significant quad contraction. Patient will benefit from intensive inpatient follow-up therapy, >3 hours/day.    If plan is discharge home, recommend the following: Two people to help with walking and/or transfers;Two people to help with bathing/dressing/bathroom   Can travel by private vehicle        Equipment Recommendations  Other (comment) (TBA)    Recommendations for Other Services       Precautions / Restrictions Precautions Precautions: Fall;Other (comment) Recall of Precautions/Restrictions: Impaired Precaution/Restrictions Comments: L hemi, neglect, rectal pouch, posey Restrictions Weight Bearing Restrictions Per Provider Order: No     Mobility  Bed Mobility Overal bed mobility: Needs Assistance Bed Mobility: Supine to Sit Rolling: Max assist, +2 for physical assistance         General bed mobility comments: Assist for L sided management, guidance to locate rail with R hand to pull up to sitting position with support at trunk. Use of bed pad to scoot L hip forward     Transfers Overall transfer level: Needs assistance Equipment used: Ambulation equipment used Transfers: Sit to/from Stand Sit to Stand: Max assist, +2 physical assistance           General transfer comment: Pt pulling up with RUE on Stedy, guarding of LUE. Pt able to achieve complete upright positioning; increased L knee flexion, but able to sustain weight through LLE. Not able to activate quad to achieve terminal knee extension    Ambulation/Gait                   Stairs             Wheelchair Mobility     Tilt Bed    Modified Rankin (Stroke Patients Only) Modified Rankin (Stroke Patients Only) Pre-Morbid Rankin Score: No symptoms Modified Rankin: Severe disability     Balance Overall balance assessment: Needs assistance Sitting-balance support: Feet supported Sitting balance-Leahy Scale: Poor Sitting balance - Comments: Left lateral and posterior lean, requiring up to maxA. benefits from person sitting to R for visual/tactile cue of "touch my shoulder." Otherwise, not adjusting posture with just verbal cueing                                    Communication Communication Communication: Impaired Factors Affecting Communication: Reduced clarity of speech  Cognition Arousal: Alert Behavior During Therapy: Flat affect, Restless   PT - Cognitive impairments: Difficult to assess Difficult to assess due to: Impaired communication                     PT - Cognition Comments:  Pt perseverating on needing to use the bathroom; reminded pt of catheter and rectal pouch. Decreased attention and awareness Following commands: Impaired Following commands impaired: Follows one step commands inconsistently    Cueing    Exercises      General Comments        Pertinent Vitals/Pain Pain Assessment Pain Assessment: Faces Faces Pain Scale: Hurts a little bit Pain Location: LUE (pointing to IV site) Pain Descriptors / Indicators:  Discomfort Pain Intervention(s): Monitored during session    Home Living                          Prior Function            PT Goals (current goals can now be found in the care plan section) Acute Rehab PT Goals Patient Stated Goal: did not state Potential to Achieve Goals: Good Progress towards PT goals: Progressing toward goals    Frequency    Min 3X/week      PT Plan      Co-evaluation PT/OT/SLP Co-Evaluation/Treatment: Yes Reason for Co-Treatment: Complexity of the patient's impairments (multi-system involvement);For patient/therapist safety;To address functional/ADL transfers;Necessary to address cognition/behavior during functional activity PT goals addressed during session: Mobility/safety with mobility        AM-PAC PT "6 Clicks" Mobility   Outcome Measure  Help needed turning from your back to your side while in a flat bed without using bedrails?: A Lot Help needed moving from lying on your back to sitting on the side of a flat bed without using bedrails?: Total Help needed moving to and from a bed to a chair (including a wheelchair)?: Total Help needed standing up from a chair using your arms (e.g., wheelchair or bedside chair)?: Total Help needed to walk in hospital room?: Total Help needed climbing 3-5 steps with a railing? : Total 6 Click Score: 7    End of Session   Activity Tolerance: Patient tolerated treatment well Patient left: with call bell/phone within reach;in chair;with chair alarm set;with family/visitor present Nurse Communication: Mobility status;Need for lift equipment PT Visit Diagnosis: Hemiplegia and hemiparesis;Unsteadiness on feet (R26.81) Hemiplegia - Right/Left: Left Hemiplegia - dominant/non-dominant: Non-dominant Hemiplegia - caused by: Nontraumatic intracerebral hemorrhage     Time: 1005-1038 PT Time Calculation (min) (ACUTE ONLY): 33 min  Charges:    $Therapeutic Activity: 8-22 mins PT General Charges $$  ACUTE PT VISIT: 1 Visit                     Verdia Glad, PT, DPT Acute Rehabilitation Services Office [phone]    Claria Crofts 12/20/2023, 12:30 PM

## 2023-12-20 NOTE — Progress Notes (Signed)
 Orthopedic Tech Progress Note Patient Details:  Troy Bishop Jun 24, 1991 [id number]  Called in order to [employer] for a RESTING HAND SPLINT  Patient ID: Troy Bishop, male   DOB: July 07, 1991, 33 y.o.   MRN: [id number]  Troy Bishop 12/20/2023, 6:10 PM

## 2023-12-20 NOTE — Progress Notes (Signed)
   NAMEDalin Bishop, MRN:  [id number], DOB:  1991-05-05, LOS: 4 ADMISSION DATE:  12/16/2023, CONSULTATION DATE:  12/16/2023 REFERRING MD: Stroke - MD, CHIEF COMPLAINT: ICH with evolving EtOH withdrawal  History of Present Illness:  Troy Bishop is a 33 year old male with past medical history significant for EtOH use and liver disease who scented to the ED at [facility] 5/9 after syncopal episode with left-sided weakness.  Presented as code stroke.  Code stroke CT revealed a large right IPH with no significant midline shift.  CTA head and neck concerning for contrast extravasation/spots on for which neurosurgery was consulted.  Patient was admitted to neuro ICU for close monitoring under the care of neurology and neurosurgery.  Mid morning of 5/10 patient became recently agitated with tachycardia additional medical history was obtained and patient's family reports patient drinks alcohol daily.  Concern for evolving alcohol withdrawal for which PCCM was consulted.  Pertinent  Medical History  Liver disease Alcohol use   Significant Hospital Events: Including procedures, antibiotic start and stop dates in addition to other pertinent events   5/9 presented after syncopal episode with left-sided weakness noted, code stroke CT confirmed IPH.  CTA head and neck concern for contrast dissociation/, neurosurgery consulted with plans to closely observe  5/10 neurologically remains able with repeat CT unchanged with right cerebral hematoma with 6 mm midline shift.  Concerns for evolving EtOH withdrawal 5/11 waxing and waning agitation/withdrawal symptoms  Interim History / Subjective:  Back on precedex overnight and somnolent again.  Objective    Blood pressure (!) 141/104, pulse 76, temperature (!) 100.9 F (38.3 C), temperature source Axillary, resp. rate (!) 22, weight 63.4 kg, SpO2 98%.        Intake/Output Summary (Last 24 hours) at 12/20/2023 1029 Last data filed at 12/20/2023 1000 Gross per 24 hour   Intake 2301.16 ml  Output 2750 ml  Net -448.84 ml   Filed Weights   12/16/23 0000  Weight: 63.4 kg    Examination:  No distress Moves L>R Lungs clear Abd soft Heart sounds regular, ext warm RASS -2  Ammonia up a bit Bicarb better K repleted again Stable thrombocytopenia  Assessment & Plan:  Nontraumatic R intracerebral hematoma with brain compression- presumably HTN related, stable on imaging Encephalopathy- mixed EtOH w/d, ICU delirium, TBI, ?HE Essential hypertension Hyperlipidemia Alcoholic hepatitis, likely underlying cirrhosis Hyponatremia- if liver disease has progressed he could have a deranged ADH axis and lower baseline Na, discussed with neurology, keep normonatremic for t+7 days from event Leukopenia Thrombocytopenia of sequestration Fevers- no obvious source query central vs precedex; looks improved/stable, Pct neg  Switch precedex to PRN ativan  CIWA driven, continue thiamine , seroquel, phenobarb taper Encourage day/night cycles Defer the hypertonic dosing to primary Ammonia being rechecked, may need to up lactulose Really needs mobilization Eventual substance abuse counseling If febrile despite being off precedex will check LE duplex  31 min cc time  Ardelle Kos MD PCCM [facility] Personal contact information can be found on Amion  If no contact or response made please call [phone] 12/20/2023, 10:29 AM

## 2023-12-20 NOTE — Progress Notes (Signed)
 Patient ID: Troy Bishop, male   DOB: 04-Jan-1991, 33 y.o.   MRN: [id number] BP 119/70 (BP Location: Right Arm)   Pulse 69   Temp 99.9 F (37.7 C) (Oral)   Resp 18   Wt 63.4 kg   SpO2 97%   BMI 22.56 kg/m  May use anticoagulant for dvt prophylaxis

## 2023-12-20 NOTE — Evaluation (Signed)
 Clinical/Bedside Swallow Evaluation Patient Details  Name: Troy Bishop MRN: [id number] Date of Birth: 28-Dec-1990  Today's Date: 12/20/2023 Time: SLP Start Time (ACUTE ONLY): 1428 SLP Stop Time (ACUTE ONLY): 1443 SLP Time Calculation (min) (ACUTE ONLY): 15 min  Past Medical History:  Past Medical History:  Diagnosis Date   Liver disease    Painful orthopaedic hardware (HCC) 07/2017   right tibia   Past Surgical History:  Past Surgical History:  Procedure Laterality Date   BREATH TEK H PYLORI N/A 09/08/2016   Procedure: BREATH TEK [name];  Surgeon: [name], MD;  Location: [facility];  Service: Gastroenterology;  Laterality: N/A;   HARDWARE REMOVAL Left 07/27/2017   Procedure: Removal of deep implants right proximal and distal tibia;  Surgeon: Amada Backer, MD;  Location: [facility];  Service: Orthopedics;  Laterality: Left;   TENDON REPAIR Left 08/14/2014   Procedure: LEFT HAND WOUND EXPLORATION AND TENDON REPAIR;  Surgeon: Shellie Dials, MD;  Location: [facility];  Service: Orthopedics;  Laterality: Left;   TIBIA IM NAIL INSERTION  07/31/2012   Procedure: INTRAMEDULLARY (IM) NAIL TIBIAL;  Surgeon: Amada Backer, MD;  Location: [facility];  Service: Orthopedics;  Laterality: Left;   UPPER GI ENDOSCOPY  07/25/2016   HPI:  Patient is a 33 y.o. male with PMH: ETOH abuse, HTN (not taking medications), single previous seizure. He presented to the hospital on 12/16/23 with left sided weakness. CT showed a large subcortical hemorrhage on the left. MRI brain showed a massive intracranial hemorrhage centered in right basal ganglia and extending into the anterior right temporal lobe. He passed Yale swallow with RN but was having observed pocketing of food, prompting order for SLP swallow evaluation in addition to SLP speech-language cognitive evaluation.    Assessment / Plan / Recommendation  Clinical Impression  Patient is presenting with clinical s/s of what  appears to be a primary oral phase dysphagia but with pharyngeal phase of swallow appearing WFL. Patient with very poor awareness to sensory and motor deficits on right side facial and oral motor musculature. This leads to right sided anterior spillage with liquids and solids, oral residuals and pocketing of PO's with puree and mechancial soft solids. Swallow initiation appeared timely and no overt s/s aspiration observed. Patient did benefit from cues to direct PO's to right side of oral cavity and to take sips of liquids to clear. SLP recommending dys 3 (mechanical soft) solids and thin liquids. SLP Visit Diagnosis: Dysphagia, unspecified (R13.10);Dysphagia, oral phase (R13.11)    Aspiration Risk  Mild aspiration risk    Diet Recommendation Dysphagia 3 (Mech soft);Thin liquid    Liquid Administration via: Cup;Straw Medication Administration: Whole meds with puree Supervision: Patient able to self feed;Full supervision/cueing for compensatory strategies Compensations: Slow rate;Small sips/bites;Minimize environmental distractions;Lingual sweep for clearance of pocketing;Monitor for anterior loss Postural Changes: Seated upright at 90 degrees    Other  Recommendations Oral Care Recommendations: Oral care BID    Recommendations for follow up therapy are one component of a multi-disciplinary discharge planning process, led by the attending physician.  Recommendations may be updated based on patient status, additional functional criteria and insurance authorization.  Follow up Recommendations Acute inpatient rehab (3hours/day)      Assistance Recommended at Discharge    Functional Status Assessment Patient has had a recent decline in their functional status and demonstrates the ability to make significant improvements in function in a reasonable and predictable amount of time.  Frequency and  Duration min 2x/week  2 weeks       Prognosis Prognosis for improved oropharyngeal function: Good       Swallow Study   General Date of Onset: 12/16/23 HPI: Patient is a 33 y.o. male with PMH: ETOH abuse, HTN (not taking medications), single previous seizure. He presented to the hospital on 12/16/23 with left sided weakness. CT showed a large subcortical hemorrhage on the left. MRI brain showed a massive intracranial hemorrhage centered in right basal ganglia and extending into the anterior right temporal lobe. He passed Yale swallow with RN but was having observed pocketing of food, prompting order for SLP swallow evaluation in addition to SLP speech-language cognitive evaluation. Type of Study: Bedside Swallow Evaluation Previous Swallow Assessment: none found Diet Prior to this Study: Regular;Thin liquids (Level 0) Temperature Spikes Noted: No Respiratory Status: Room air History of Recent Intubation: No Behavior/Cognition: Alert;Cooperative;Pleasant mood;Impulsive Oral Cavity Assessment: Within Functional Limits Oral Care Completed by SLP: No Oral Cavity - Dentition: Adequate natural dentition Vision: Functional for self-feeding Self-Feeding Abilities: Able to feed self Patient Positioning: Upright in bed Baseline Vocal Quality: Normal Volitional Swallow: Able to elicit    Oral/Motor/Sensory Function Overall Oral Motor/Sensory Function: Severe impairment Facial ROM: Reduced left;Suspected CN VII (facial) dysfunction Facial Symmetry: Abnormal symmetry left;Suspected CN VII (facial) dysfunction Facial Strength: Reduced left;Suspected CN VII (facial) dysfunction Facial Sensation: Reduced left;Suspected CN V (Trigeminal) dysfunction Lingual ROM: Reduced left;Suspected CN XII (hypoglossal) dysfunction Lingual Symmetry: Abnormal symmetry left;Suspected CN XII (hypoglossal) dysfunction   Ice Chips     Thin Liquid Thin Liquid: Impaired Presentation: Cup;Self Fed;Straw Oral Phase Impairments: Reduced labial seal Oral Phase Functional Implications: Left anterior spillage Pharyngeal   Phase Impairments: Other (comments) (no overt s/s)    Nectar Thick     Honey Thick     Puree Puree: Impaired Presentation: Spoon Oral Phase Functional Implications: Left anterior spillage;Oral residue;Left lateral sulci pocketing   Solid     Solid: Impaired Oral Phase Functional Implications: Left anterior spillage;Oral residue     Jacqualine Mater, MA, CCC-SLP Speech Therapy

## 2023-12-20 NOTE — Progress Notes (Signed)
 12/20/2023  Full note to follow - Increase coreg - DC precedex, to ativan  CIWA driven, continue seroquel - ?increase lactulose - Mobility

## 2023-12-20 NOTE — Progress Notes (Addendum)
 Occupational Therapy Treatment Patient Details Name: Troy Bishop MRN: [id number] DOB: 04/11/1991 Today's Date: 12/20/2023   History of present illness Pt is a 33 y.o. male presenting 5/10 after sudden onset L weakness and collapse. CTH with large L subcortical hemorrhage, repeat CT 1 hour later showing progression of ICH from 35mL to 52mL with small volume hemorrhage within the  right lateral ventricle, consistent with intraventricular extension. PMH significant of uncontrolled HTN, cirrhosis due to alcohol abuse, thrombocytopenia   OT comments  Pt progressing toward established OT goals. Focus session on sitting balance, STS transfers with use of stedy and transition to chair, and visual scanning to L. Pt following one step commands with increased time but needing multimodal cues to follow commands as they pertain to balance to facilitate upright positioning. Integrating crossing midline with reaching with RUE across body as well as weightbearing with L lateral lean. Pt needing max A +2 for STS transfers this session as well as bed mobility and mod-max A for sitting balance.LUE remains flaccid this session; ordered L resting hand splint (4 hours on/off schedule) and sling to be donned for transfers. Will continue to follow; recommend discharge to intensive inpatient rehabilitation >3 hours.       If plan is discharge home, recommend the following:  Two people to help with walking and/or transfers;Two people to help with bathing/dressing/bathroom;Assist for transportation;Help with stairs or ramp for entrance;Direct supervision/assist for medications management;Direct supervision/assist for financial management;Assistance with feeding;Assistance with cooking/housework   Equipment Recommendations  Other (comment) (defer)    Recommendations for Other Services Rehab consult    Precautions / Restrictions Precautions Precautions: Fall;Other (comment) Recall of Precautions/Restrictions:  Impaired Precaution/Restrictions Comments: L hemi, neglect, rectal pouch, posey Restrictions Weight Bearing Restrictions Per Provider Order: No       Mobility Bed Mobility Overal bed mobility: Needs Assistance Bed Mobility: Supine to Sit     Supine to sit: Max assist, +2 for safety/equipment, +2 for physical assistance     General bed mobility comments: Assist for L sided management, guidance to locate rail with R hand to pull up to sitting position with support at trunk. Use of bed pad to scoot L hip forward    Transfers Overall transfer level: Needs assistance Equipment used: Ambulation equipment used Transfers: Sit to/from Stand Sit to Stand: Max assist, +2 physical assistance           General transfer comment: Pt pulling up with RUE on Stedy, guarding of LUE. Pt able to achieve complete upright positioning; increased L knee flexion, but able to sustain weight through LLE. Not able to activate quad to achieve terminal knee extension. Constant cues in standing for midline positioning with external target to get to midline (touch your shoulder to my shoulder)     Balance Overall balance assessment: Needs assistance Sitting-balance support: Feet supported Sitting balance-Leahy Scale: Poor Sitting balance - Comments: Left lateral and posterior lean, requiring up to maxA. benefits from person sitting to R for visual/tactile cue of "touch my shoulder." Otherwise, not adjusting posture with just verbal cueing   Standing balance support: Bilateral upper extremity supported Standing balance-Leahy Scale: Zero                             ADL either performed or assessed with clinical judgement   ADL Overall ADL's : Needs assistance/impaired     Grooming: Moderate assistance;Maximal assistance;Sitting Grooming Details (indicate cue type and reason): cues for receiving grooming  items             Lower Body Dressing: Maximal assistance Lower Body Dressing  Details (indicate cue type and reason): to don socks using figure 4 from bed level. Cues for problem solving, more reliant on OT to place LLE Toilet Transfer: Maximal assistance;+2 for physical assistance;+2 for safety/equipment Toilet Transfer Details (indicate cue type and reason): stedy         Functional mobility during ADLs: Maximal assistance;+2 for physical assistance;+2 for safety/equipment      Extremity/Trunk Assessment Upper Extremity Assessment Upper Extremity Assessment: LUE deficits/detail LUE Deficits / Details: no active movement.   Lower Extremity Assessment Lower Extremity Assessment: Defer to PT evaluation        Vision   Vision Assessment?: Vision impaired- to be further tested in functional context Additional Comments: suspect L visual field deficit; able to scan L on command with incresaed time and cues. pt with decr attention to environment and body on L   Perception Perception Perception: Impaired Preception Impairment Details: Inattention/Neglect Perception-Other Comments: L   Praxis Praxis Praxis: Impaired Praxis Impairment Details:  (pushes L)   Communication Communication Communication: Impaired Factors Affecting Communication: Reduced clarity of speech   Cognition Arousal: Alert Behavior During Therapy: Flat affect, Restless Cognition: Cognition impaired, Difficult to assess Difficult to assess due to: Impaired communication Orientation impairments: Time, Situation, Place Awareness: Intellectual awareness impaired, Online awareness impaired Memory impairment (select all impairments): Short-term memory, Working memory Attention impairment (select first level of impairment): Sustained attention   OT - Cognition Comments: Pt perseverative on need to use restroom and decr understanding of catheter as well as fecal pouch                 Following commands: Impaired Following commands impaired: Follows one step commands inconsistently       Cueing   Cueing Techniques: Verbal cues, Gestural cues, Tactile cues  Exercises Exercises: Other exercises Other Exercises Other Exercises: reaching with RUE across body and visual scanning toward L for desired target 5x Other Exercises: L lateral lean onto hemi arm to facilitate weight bearing.    Shoulder Instructions       General Comments      Pertinent Vitals/ Pain          Home Living     Available Help at Discharge: Family Type of Home: Apartment                              Lives With: Spouse    Prior Functioning/Environment              Frequency  Min 2X/week        Progress Toward Goals  OT Goals(current goals can now be found in the care plan section)  Progress towards OT goals: Progressing toward goals  Acute Rehab OT Goals OT Goal Formulation: With patient Time For Goal Achievement: 01/01/24 Potential to Achieve Goals: Good ADL Goals Pt Will Perform Grooming: with min assist;sitting Pt Will Perform Upper Body Bathing: with min assist;sitting Pt Will Perform Lower Body Bathing: with mod assist;sit to/from stand Pt Will Perform Lower Body Dressing: sit to/from stand;with max assist Pt Will Transfer to Toilet: with mod assist;stand pivot transfer Additional ADL Goal #1: Pt will locate 3/5 items on tray table with min cues to scan L Additional ADL Goal #2: pt will perform bed mobility with min A as a precursor to ADL  Plan  Co-evaluation    PT/OT/SLP Co-Evaluation/Treatment: Yes Reason for Co-Treatment: Complexity of the patient's impairments (multi-system involvement);For patient/therapist safety;To address functional/ADL transfers;Necessary to address cognition/behavior during functional activity PT goals addressed during session: Mobility/safety with mobility OT goals addressed during session: ADL's and self-care      AM-PAC OT "6 Clicks" Daily Activity     Outcome Measure   Help from another person eating  meals?: Total Help from another person taking care of personal grooming?: A Lot Help from another person toileting, which includes using toliet, bedpan, or urinal?: Total Help from another person bathing (including washing, rinsing, drying)?: A Lot Help from another person to put on and taking off regular upper body clothing?: A Lot Help from another person to put on and taking off regular lower body clothing?: A Lot 6 Click Score: 10    End of Session Equipment Utilized During Treatment: Gait belt;Other (comment) (stedy)  OT Visit Diagnosis: Unsteadiness on feet (R26.81);Muscle weakness (generalized) (M62.81);Low vision, both eyes (H54.2);Other symptoms and signs involving cognitive function;Cognitive communication deficit (R41.841);Hemiplegia and hemiparesis Symptoms and signs involving cognitive functions: Nontraumatic intracerebral hemorrhage Hemiplegia - Right/Left: Left Hemiplegia - caused by: Nontraumatic intracerebral hemorrhage   Activity Tolerance Patient tolerated treatment well   Patient Left in bed;with call bell/phone within reach;with bed alarm set   Nurse Communication Mobility status        Time: 4132-4401 OT Time Calculation (min): 29 min  Charges: OT General Charges $OT Visit: 1 Visit OT Treatments $Therapeutic Activity: 8-22 mins  Karilyn Ouch, OTR/L [facility] Acute Rehabilitation Office: [phone]   Emery Hans 12/20/2023, 4:51 PM

## 2023-12-21 ENCOUNTER — Inpatient Hospital Stay (HOSPITAL_COMMUNITY)

## 2023-12-21 DIAGNOSIS — I10 Essential (primary) hypertension: Secondary | ICD-10-CM | POA: Diagnosis not present

## 2023-12-21 DIAGNOSIS — I629 Nontraumatic intracranial hemorrhage, unspecified: Secondary | ICD-10-CM | POA: Diagnosis not present

## 2023-12-21 DIAGNOSIS — I69391 Dysphagia following cerebral infarction: Secondary | ICD-10-CM | POA: Diagnosis not present

## 2023-12-21 DIAGNOSIS — R609 Edema, unspecified: Secondary | ICD-10-CM

## 2023-12-21 DIAGNOSIS — G936 Cerebral edema: Secondary | ICD-10-CM | POA: Diagnosis not present

## 2023-12-21 DIAGNOSIS — I610 Nontraumatic intracerebral hemorrhage in hemisphere, subcortical: Secondary | ICD-10-CM | POA: Diagnosis not present

## 2023-12-21 DIAGNOSIS — K7030 Alcoholic cirrhosis of liver without ascites: Secondary | ICD-10-CM | POA: Diagnosis not present

## 2023-12-21 DIAGNOSIS — R509 Fever, unspecified: Secondary | ICD-10-CM | POA: Diagnosis not present

## 2023-12-21 LAB — COMPREHENSIVE METABOLIC PANEL WITH GFR
ALT: 169 U/L — ABNORMAL HIGH (ref 0–44)
AST: 130 U/L — ABNORMAL HIGH (ref 15–41)
Albumin: 3.2 g/dL — ABNORMAL LOW (ref 3.5–5.0)
Alkaline Phosphatase: 76 U/L (ref 38–126)
Anion gap: 12 (ref 5–15)
BUN: 9 mg/dL (ref 6–20)
CO2: 18 mmol/L — ABNORMAL LOW (ref 22–32)
Calcium: 8.8 mg/dL — ABNORMAL LOW (ref 8.9–10.3)
Chloride: 109 mmol/L (ref 98–111)
Creatinine, Ser: 0.7 mg/dL (ref 0.61–1.24)
GFR, Estimated: >60 mL/min (ref >60)
Glucose, Bld: 132 mg/dL — ABNORMAL HIGH (ref 70–99)
Potassium: 3.3 mmol/L — ABNORMAL LOW (ref 3.5–5.1)
Sodium: 139 mmol/L (ref 135–145)
Total Bilirubin: 1.6 mg/dL — ABNORMAL HIGH (ref 0.0–1.2)
Total Protein: 7.5 g/dL (ref 6.5–8.1)

## 2023-12-21 LAB — MAGNESIUM: Magnesium: 2.1 mg/dL (ref 1.7–2.4)

## 2023-12-21 LAB — CBC
HCT: 41.9 % (ref 39.0–52.0)
Hemoglobin: 14.3 g/dL (ref 13.0–17.0)
MCH: 31.6 pg (ref 26.0–34.0)
MCHC: 34.1 g/dL (ref 30.0–36.0)
MCV: 92.5 fL (ref 80.0–100.0)
Platelets: 55 10*3/uL — ABNORMAL LOW (ref 150–400)
RBC: 4.53 MIL/uL (ref 4.22–5.81)
RDW: 14.4 % (ref 11.5–15.5)
WBC: 5.8 10*3/uL (ref 4.0–10.5)
nRBC: 0 % (ref 0.0–0.2)

## 2023-12-21 LAB — PHOSPHORUS: Phosphorus: 3.7 mg/dL (ref 2.5–4.6)

## 2023-12-21 LAB — SODIUM
Sodium: 139 mmol/L (ref 135–145)
Sodium: 139 mmol/L (ref 135–145)

## 2023-12-21 MED ORDER — POTASSIUM CHLORIDE CRYS ER 20 MEQ PO TBCR
40.0000 meq | EXTENDED_RELEASE_TABLET | ORAL | Status: AC
  Administered 2023-12-21 (×3): 40 meq via ORAL
  Filled 2023-12-21 (×3): qty 2

## 2023-12-21 MED ORDER — CARVEDILOL 12.5 MG PO TABS
25.0000 mg | ORAL_TABLET | Freq: Two times a day (BID) | ORAL | Status: DC
  Administered 2023-12-21 – 2023-12-22 (×2): 25 mg via ORAL
  Filled 2023-12-21 (×2): qty 2

## 2023-12-21 MED ORDER — CARVEDILOL 12.5 MG PO TABS
12.5000 mg | ORAL_TABLET | Freq: Once | ORAL | Status: AC
  Administered 2023-12-21: 12.5 mg via ORAL
  Filled 2023-12-21: qty 1

## 2023-12-21 NOTE — Progress Notes (Signed)
 STROKE TEAM PROGRESS NOTE   INTERIM HISTORY/SUBJECTIVE Father is at bedside. Patient is agitated overnight, currently on Posey belt and wrist restraint.  Received Ativan  for agitation.  Currently drowsy sleepy after medication.  Per RN, patient neurologically stable, unchanged.  Taper down 3% today.  Repeat CT in a.m.   OBJECTIVE  CBC    Component Value Date/Time   WBC 5.8 12/21/2023 0930   RBC 4.53 12/21/2023 0930   HGB 14.3 12/21/2023 0930   HCT 41.9 12/21/2023 0930   PLT 55 (L) 12/21/2023 0930   MCV 92.5 12/21/2023 0930   MCH 31.6 12/21/2023 0930   MCHC 34.1 12/21/2023 0930   RDW 14.4 12/21/2023 0930   LYMPHSABS 0.9 12/16/2023 0024   MONOABS 0.6 12/16/2023 0024   EOSABS 0.1 12/16/2023 0024   BASOSABS 0.0 12/16/2023 0024    BMET    Component Value Date/Time   NA 139 12/21/2023 1202   K 3.3 (L) 12/21/2023 0930   CL 109 12/21/2023 0930   CO2 18 (L) 12/21/2023 0930   GLUCOSE 132 (H) 12/21/2023 0930   BUN 9 12/21/2023 0930   CREATININE 0.70 12/21/2023 0930   CALCIUM 8.8 (L) 12/21/2023 0930   GFRNONAA >60 12/21/2023 0930    IMAGING past 24 hours No results found.   Vitals:   12/21/23 1600 12/21/23 1641 12/21/23 1700 12/21/23 1800  BP: (!) 146/113 (!) 146/113 (!) 154/107 (!) 161/104  Pulse: 92  94 100  Resp: (!) 21  (!) 22 19  Temp:      TempSrc:      SpO2: 98%  98% 98%  Weight:         PHYSICAL EXAM General: Ill-appearing patient in no acute distress Psych: Calm and cooperative CV: Regular, tachycardic rate and rhythm on monitor Respiratory:  Regular, unlabored respirations on room air  NEURO:  Drowsy sleepy hard to arouse. When voice and tactile stimulation, he was able to open eyes briefly, answer orientation questions but apparently disorientated to place and age.  Still moving right upper and lower extremity spontaneously, however flaccid on the left.   ASSESSMENT/PLAN  [name] is a 33 y.o. male with history of hypertension, seizure and daily  alcohol use admitted for acute onset left-sided weakness.  He was found to have a large right subcortical ICH.  On later CT angiogram, focus of contrast-enhancement within cerebral hematoma was found, and repeat CT demonstrated enlargement of ICH.  He received platelet transfusion for platelet count of 54.  However, follow-up CT in the morning of 5/10 revealed stable ICH.  Neurosurgery was consulted but has declined to intervene.  Cleviprex was used for blood pressure control, and hypertonic saline was initiated to decrease edema.  In the morning of 5/10, patient became restless, agitated, diaphoretic and developed nausea.  He reports that he has 2-3 drinks of alcohol daily, and phenobarbital taper along with Precedex drip were initiated to treat alcohol withdrawal.  NIH on Admission 18, ICH score 1  ICH:  right BG large ICH, etiology: Likely hypertensive Code Stroke CT head 35 mm ICH in right frontotemporal region CTA head & neck focus of contrast enhancement within bed of right cerebral hematoma, no other underlying vascular abnormality Repeat CT head interval increase in size of right cerebral hemorrhage, now estimated volume 52 mL Repeat CT head 5/10 unchanged right cerebral hematoma with 6 mm of midline shift MRI stable hematoma and MLS, no abnormal enhancement  Repeat CT in am 2D Echo EF 60 to 65% LDL 124  HgbA1c 5.6 UDS positive for barbiturates [facility] medication) VTE prophylaxis - continue heparin subcu No antithrombotic prior to admission,  No antithrombotic secondary to ICH Therapy recommendations: CIR Disposition: Pending  Cerebral edema Large right ICH with 6 mm midline shift noted on CT 5/10 and 5/11 Continue 3% hypertonic saline at 75->40 cc/h -> consider weaning off tomorrow if neuro stable Monitor sodium every 6 hours  Na 142-> 138->132->136->138->137 MRI stable hematoma and MLS Repeat CT in am  Hypertension Home meds: None Still on Cleviprex, taper off as able As  needed hydralazine and labetalol On amlodipine 10 mg daily Add cozaar 50->100 Add coreg 6.25--12.5--25mg  BID  BP goal less than 160 Long-term BP goal normotensive  Hyperlipidemia Home meds: None LDL 124, goal < 70 Consider statin once LFT normalizes  Alcohol withdrawal Patient states he drinks 2-3 drinks per day EtOH level on admission 264 Patient noted to be tachycardic, diaphoretic, agitated, restless with nausea and vomiting on 5/10 CCM consulted and phenobarbital taper initiated for EtOH withdrawal Precedex stopped, CIWA protocol Ativan  in place.  Still agitated 5/15 on restrain and ativan  PRN ETOH abuse cessation discussed this morning with patient and wife at bedside.   Thrombocytopenia Elevated LFTs Platelet 56--60--49--46-46--55 Status post platelet transfusion x 1 Likely related to chronic alcohol use CBC monitoring AST/ALT 146/144--114/119--106/118--122/126--130/169  Fever, likely central fever Tmax 101.2-- 101.1--100-100.9--100.6 CXR negative UA negative Monitor fever curve  Dysphagia On dysphagia 2 and thin liquid However patient not eating adequately Encourage p.o. intake May consider core Trak in a.m. for nutrition  Other Active Problems Hypokalemia, K3.2--3.7--3.0->3.3 - supplement  Hospital day # 5   Consuelo Denmark, MD PhD Stroke Neurology 12/21/2023 6:37 PM  This patient is critically ill due to large ICH, alcohol withdrawal, thrombocytopenia, elevated LFTs and fever and at significant risk of neurological worsening, death form brain herniation, hematoma expansion, DT, liver failure, bleeding from thrombocytopenia. This patient's care requires constant monitoring of vital signs, hemodynamics, respiratory and cardiac monitoring, review of multiple databases, neurological assessment, discussion with family, other specialists and medical decision making of high complexity. I spent 35 minutes of neurocritical care time in the care of this patient.     To  contact Stroke Continuity provider, please refer to [email or web address]. After hours, contact General Neurology

## 2023-12-21 NOTE — Progress Notes (Signed)
 Physical Therapy Treatment Patient Details Name: Troy Bishop MRN: [id number] DOB: 11-18-1990 Today's Date: 12/21/2023   History of Present Illness Pt is a 33 y.o. male presenting 5/10 after sudden onset L weakness and collapse. CTH with large L subcortical hemorrhage, repeat CT 1 hour later showing progression of ICH from 35mL to 52mL with small volume hemorrhage within the  right lateral ventricle, consistent with intraventricular extension. PMH significant of uncontrolled HTN, cirrhosis due to alcohol abuse, thrombocytopenia    PT Comments  Pt sleepy due to medications but able to tolerate 30 minutes of PT session today. Pt requiring two person assist for transfers to standing with + left knee buckle. Worked on static sitting balance with bed in slight Trendelenberg to decrease left lateral lean with multimodal cues for midline posture. Pt continues with left hemiplegia, impaired sitting/standing balance, cognition, and communication. Patient will benefit from intensive inpatient follow-up therapy, >3 hours/day.   If plan is discharge home, recommend the following: Two people to help with walking and/or transfers;Two people to help with bathing/dressing/bathroom   Can travel by private vehicle        Equipment Recommendations  Other (comment) (TBA)    Recommendations for Other Services       Precautions / Restrictions Precautions Precautions: Fall;Other (comment) Recall of Precautions/Restrictions: Impaired Precaution/Restrictions Comments: L hemi, neglect, rectal pouch, posey Restrictions Weight Bearing Restrictions Per Provider Order: No     Mobility  Bed Mobility Overal bed mobility: Needs Assistance Bed Mobility: Supine to Sit Rolling: Max assist         General bed mobility comments: Assist for L sided management, cues for initiation    Transfers Overall transfer level: Needs assistance Equipment used: None Transfers: Sit to/from Stand Sit to Stand: Max assist, +2  physical assistance           General transfer comment: MaxA + 2 to stand from edge of bed, L knee block with + buckle    Ambulation/Gait                   Stairs             Wheelchair Mobility     Tilt Bed    Modified Rankin (Stroke Patients Only) Modified Rankin (Stroke Patients Only) Pre-Morbid Rankin Score: No symptoms Modified Rankin: Severe disability     Balance Overall balance assessment: Needs assistance Sitting-balance support: Feet supported Sitting balance-Leahy Scale: Poor Sitting balance - Comments: Left lateral and posterior lean, requiring up to maxA. benefits from person sitting to R for visual/tactile cue of "touch my shoulder." Otherwise, not adjusting posture with just verbal cueing                                    Communication Communication Communication: Impaired Factors Affecting Communication: Reduced clarity of speech  Cognition Arousal: Alert Behavior During Therapy: Flat affect, Restless   PT - Cognitive impairments: Difficult to assess Difficult to assess due to: Impaired communication                     PT - Cognition Comments: Pt more drowsy due to medication, difficult to understand speech, follows commands intermittently Following commands: Impaired Following commands impaired: Follows one step commands inconsistently    Cueing Cueing Techniques: Verbal cues, Gestural cues, Tactile cues, Visual cues  Exercises Other Exercises Other Exercises: Sitting: rubbing lotion onto L leg with R hand  for truncal rotation and promoting L awareness    General Comments        Pertinent Vitals/Pain Pain Assessment Pain Assessment: Faces Faces Pain Scale: No hurt    Home Living                          Prior Function            PT Goals (current goals can now be found in the care plan section) Acute Rehab PT Goals Patient Stated Goal: did not state Potential to Achieve Goals:  Good Progress towards PT goals: Progressing toward goals    Frequency    Min 3X/week      PT Plan      Co-evaluation PT/OT/SLP Co-Evaluation/Treatment: Yes Reason for Co-Treatment: Complexity of the patient's impairments (multi-system involvement);For patient/therapist safety;To address functional/ADL transfers;Necessary to address cognition/behavior during functional activity PT goals addressed during session: Mobility/safety with mobility        AM-PAC PT "6 Clicks" Mobility   Outcome Measure  Help needed turning from your back to your side while in a flat bed without using bedrails?: A Lot Help needed moving from lying on your back to sitting on the side of a flat bed without using bedrails?: Total Help needed moving to and from a bed to a chair (including a wheelchair)?: Total Help needed standing up from a chair using your arms (e.g., wheelchair or bedside chair)?: Total Help needed to walk in hospital room?: Total Help needed climbing 3-5 steps with a railing? : Total 6 Click Score: 7    End of Session   Activity Tolerance: Patient tolerated treatment well Patient left: with call bell/phone within reach;in chair;with chair alarm set;with family/visitor present Nurse Communication: Mobility status;Need for lift equipment PT Visit Diagnosis: Hemiplegia and hemiparesis;Unsteadiness on feet (R26.81) Hemiplegia - Right/Left: Left Hemiplegia - dominant/non-dominant: Non-dominant Hemiplegia - caused by: Nontraumatic intracerebral hemorrhage     Time: 1401-1431 PT Time Calculation (min) (ACUTE ONLY): 30 min  Charges:    $Therapeutic Activity: 23-37 mins PT General Charges $$ ACUTE PT VISIT: 1 Visit                     Verdia Glad, PT, DPT Acute Rehabilitation Services Office [phone]    Claria Crofts 12/21/2023, 4:09 PM

## 2023-12-21 NOTE — TOC CAGE-AID Note (Signed)
 Transition of Care [facility]) - CAGE-AID Screening   Patient Details  Name: Troy Bishop MRN: [id number] Date of Birth: 1991-01-29  Transition of Care [facility]) CM/SW Contact:    Jannice Mends, LCSW Phone Number: 12/21/2023, 3:55 PM   Clinical Narrative: Patient not appropriate for screening at this time due to lethargy.    CAGE-AID Screening: Substance Abuse Screening unable to be completed due to: : Patient unable to participate

## 2023-12-21 NOTE — Progress Notes (Signed)
 NAMESanthosh Ranft, MRN:  [id number], DOB:  Sep 19, 1990, LOS: 5 ADMISSION DATE:  12/16/2023, CONSULTATION DATE:  12/16/2023 REFERRING MD: Stroke - MD, CHIEF COMPLAINT: ICH with evolving EtOH withdrawal  History of Present Illness:  Troy Bishop is a 33 year old male with past medical history significant for EtOH use and liver disease who scented to the ED at [facility] 5/9 after syncopal episode with left-sided weakness.  Presented as code stroke.  Code stroke CT revealed a large right IPH with no significant midline shift.  CTA head and neck concerning for contrast extravasation/spots on for which neurosurgery was consulted.  Patient was admitted to neuro ICU for close monitoring under the care of neurology and neurosurgery.  Mid morning of 5/10 patient became recently agitated with tachycardia additional medical history was obtained and patient's family reports patient drinks alcohol daily.  Concern for evolving alcohol withdrawal for which PCCM was consulted.  Pertinent  Medical History  Liver disease Alcohol use  Significant Hospital Events: Including procedures, antibiotic start and stop dates in addition to other pertinent events   5/9 presented after syncopal episode with left-sided weakness noted, code stroke CT confirmed IPH.  CTA head and neck concern for contrast dissociation/, neurosurgery consulted with plans to closely observe  5/10 neurologically remains stable with repeat CT unchanged with right cerebral hematoma with 6 mm midline shift.  Concerns for evolving EtOH withdrawal 5/11 waxing and waning agitation/withdrawal symptoms 5/13-5/14: overnight back on precedex  5/14: put back on CIWA ativan  rather than precedex  5/15: getting ativan  often still. Increasing BP meds   Interim History / Subjective:  Ammonia check yesterday and okay. Still needing plenty of ativan  off of precedex gtt but is calm at this time. Tremulous. Hypertensive - increased coreg to 25 BID. If this doesn't help,  consider hydralazine scheduled. Cortrak ordered.   Objective    Blood pressure (!) 168/107, pulse 84, temperature 98.7 F (37.1 C), temperature source Oral, resp. rate 19, weight 63.4 kg, SpO2 97%.        Intake/Output Summary (Last 24 hours) at 12/21/2023 0747 Last data filed at 12/21/2023 0735 Gross per 24 hour  Intake 2783.08 ml  Output 3300 ml  Net -516.92 ml   Filed Weights   12/16/23 0000  Weight: 63.4 kg    Examination:  Younger male, acute on chronically ill appearing  Wakes to voice, answers questions appropriately, right sided weaker. Tremulous  Room air, CTAB, resp even and unlabored  Abdomen rounded, soft S1s2, no murmur, rub, gallop  No LE edema   Assessment & Plan:  Nontraumatic R intracerebral hematoma with brain compression- presumably HTN related, stable on imaging Encephalopathy- mixed EtOH w/d, ICU delirium, TBI, ?HE Essential hypertension Hyperlipidemia Alcoholic hepatitis, likely underlying cirrhosis Hyponatremia- if liver disease has progressed he could have a deranged ADH axis and lower baseline Na, discussed with neurology, keep normonatremic for t+7 days from event Leukopenia Thrombocytopenia of sequestration Fevers- no obvious source query central vs precedex; looks improved/stable, Pct neg  Continue Ativan  CIWA driven, continue thiamine , seroquel Encourage day/night cycles Defer the hypertonic dosing to primary Ammonia stable, keep lactulose at current dosing  F/u DVT studies due to fevers off precedex although could be neuro? As white count is 5.8.  Really needs mobilization - PT/OT/SLP ordered  Eventual substance abuse counseling  32 min cc time  Lalla Pill, PA-C [facility] 12/21/23 10:50 AM  Please see [email or web address] for pager details.  From 7A-7P if no response,  please call [phone] After hours, please call ELink [phone]

## 2023-12-21 NOTE — Progress Notes (Signed)
 SLP Cancellation Note  Patient Details Name: Troy Bishop MRN: [id number] DOB: Oct 12, 1990   Cancelled treatment:       Reason Eval/Treat Not Completed: Patient's level of consciousness. Pt is sleeping deeply and has been more lethargic due to medication. RN reports his oral intake has been minimal due to lethargy and there is a plan for cortrak tomorrow. He has not been observed to cough or choke with liquids, but isnt really eating. Might be getting fatigued with meal due to texture. Did not observed pt with PO today, but will downgrade texture to minced and moist in addition to plan for cortrak and f/u.   Jazz Rogala, Hardin Leys 12/21/2023, 11:59 AM

## 2023-12-21 NOTE — Progress Notes (Signed)
 BLE venous duplex has been completed.   Results can be found under chart review under CV PROC. 12/21/2023 6:41 PM Arrow Tomko RVT, RDMS

## 2023-12-21 NOTE — Plan of Care (Signed)
  Problem: Education: Goal: Knowledge of secondary prevention will improve (MUST DOCUMENT ALL) 12/21/2023 1000 by Youlanda Henry, RN Outcome: Progressing 12/21/2023 0706 by Youlanda Henry, RN Outcome: Progressing   Problem: Education: Goal: Knowledge of patient specific risk factors will improve (DELETE if not current risk factor) 12/21/2023 1000 by Youlanda Henry, RN Outcome: Progressing 12/21/2023 0706 by Youlanda Henry, RN Outcome: Progressing   Problem: Coping: Goal: Will identify appropriate support needs 12/21/2023 1000 by Youlanda Henry, RN Outcome: Progressing 12/21/2023 0706 by Youlanda Henry, RN Outcome: Progressing   Problem: Coping: Goal: Will verbalize positive feelings about self 12/21/2023 1000 by Youlanda Henry, RN Outcome: Progressing 12/21/2023 0706 by Youlanda Henry, RN Outcome: Progressing   Problem: Self-Care: Goal: Ability to participate in self-care as condition permits will improve 12/21/2023 1000 by Youlanda Henry, RN Outcome: Progressing 12/21/2023 0706 by Youlanda Henry, RN Outcome: Progressing   Problem: Self-Care: Goal: Ability to communicate needs accurately will improve 12/21/2023 1000 by Youlanda Henry, RN Outcome: Progressing 12/21/2023 0706 by Youlanda Henry, RN Outcome: Progressing

## 2023-12-22 ENCOUNTER — Inpatient Hospital Stay (HOSPITAL_COMMUNITY)

## 2023-12-22 DIAGNOSIS — I69391 Dysphagia following cerebral infarction: Secondary | ICD-10-CM | POA: Diagnosis not present

## 2023-12-22 DIAGNOSIS — I610 Nontraumatic intracerebral hemorrhage in hemisphere, subcortical: Secondary | ICD-10-CM | POA: Diagnosis not present

## 2023-12-22 DIAGNOSIS — G936 Cerebral edema: Secondary | ICD-10-CM | POA: Diagnosis not present

## 2023-12-22 DIAGNOSIS — I10 Essential (primary) hypertension: Secondary | ICD-10-CM | POA: Diagnosis not present

## 2023-12-22 LAB — COMPREHENSIVE METABOLIC PANEL WITH GFR
ALT: 179 U/L — ABNORMAL HIGH (ref 0–44)
AST: 160 U/L — ABNORMAL HIGH (ref 15–41)
Albumin: 2.9 g/dL — ABNORMAL LOW (ref 3.5–5.0)
Alkaline Phosphatase: 65 U/L (ref 38–126)
Anion gap: 9 (ref 5–15)
BUN: 10 mg/dL (ref 6–20)
CO2: 19 mmol/L — ABNORMAL LOW (ref 22–32)
Calcium: 8.6 mg/dL — ABNORMAL LOW (ref 8.9–10.3)
Chloride: 109 mmol/L (ref 98–111)
Creatinine, Ser: 0.67 mg/dL (ref 0.61–1.24)
GFR, Estimated: >60 mL/min (ref >60)
Glucose, Bld: 95 mg/dL (ref 70–99)
Potassium: 3.6 mmol/L (ref 3.5–5.1)
Sodium: 137 mmol/L (ref 135–145)
Total Bilirubin: 1.6 mg/dL — ABNORMAL HIGH (ref 0.0–1.2)
Total Protein: 7.2 g/dL (ref 6.5–8.1)

## 2023-12-22 LAB — CBC
HCT: 40.3 % (ref 39.0–52.0)
Hemoglobin: 13.2 g/dL (ref 13.0–17.0)
MCH: 30.5 pg (ref 26.0–34.0)
MCHC: 32.8 g/dL (ref 30.0–36.0)
MCV: 93.1 fL (ref 80.0–100.0)
Platelets: 70 10*3/uL — ABNORMAL LOW (ref 150–400)
RBC: 4.33 MIL/uL (ref 4.22–5.81)
RDW: 14 % (ref 11.5–15.5)
WBC: 5.9 10*3/uL (ref 4.0–10.5)
nRBC: 0 % (ref 0.0–0.2)

## 2023-12-22 LAB — SODIUM
Sodium: 136 mmol/L (ref 135–145)
Sodium: 137 mmol/L (ref 135–145)
Sodium: 137 mmol/L (ref 135–145)

## 2023-12-22 LAB — PHOSPHORUS: Phosphorus: 4.3 mg/dL (ref 2.5–4.6)

## 2023-12-22 LAB — GLUCOSE, CAPILLARY
Glucose-Capillary: 103 mg/dL — ABNORMAL HIGH (ref 70–99)
Glucose-Capillary: 116 mg/dL — ABNORMAL HIGH (ref 70–99)
Glucose-Capillary: 136 mg/dL — ABNORMAL HIGH (ref 70–99)

## 2023-12-22 LAB — MAGNESIUM: Magnesium: 2.1 mg/dL (ref 1.7–2.4)

## 2023-12-22 MED ORDER — ADULT MULTIVITAMIN W/MINERALS CH
1.0000 | ORAL_TABLET | Freq: Every day | ORAL | Status: DC
  Administered 2023-12-22 – 2023-12-25 (×4): 1
  Filled 2023-12-22 (×4): qty 1

## 2023-12-22 MED ORDER — LACTULOSE 10 GM/15ML PO SOLN
30.0000 g | Freq: Two times a day (BID) | ORAL | Status: DC
  Administered 2023-12-22 – 2023-12-25 (×5): 30 g
  Filled 2023-12-22: qty 45
  Filled 2023-12-22: qty 60
  Filled 2023-12-22: qty 45
  Filled 2023-12-22: qty 60
  Filled 2023-12-22: qty 45
  Filled 2023-12-22: qty 60

## 2023-12-22 MED ORDER — QUETIAPINE FUMARATE 50 MG PO TABS
50.0000 mg | ORAL_TABLET | Freq: Two times a day (BID) | ORAL | Status: DC
  Administered 2023-12-22 – 2023-12-25 (×7): 50 mg
  Filled 2023-12-22: qty 1
  Filled 2023-12-22 (×2): qty 2
  Filled 2023-12-22 (×3): qty 1
  Filled 2023-12-22: qty 2

## 2023-12-22 MED ORDER — SODIUM CHLORIDE 0.9 % IV SOLN: INTRAVENOUS | Status: AC

## 2023-12-22 MED ORDER — LORAZEPAM 2 MG/ML IJ SOLN
2.0000 mg | Freq: Once | INTRAMUSCULAR | Status: AC
  Administered 2023-12-22: 2 mg via INTRAVENOUS
  Filled 2023-12-22: qty 1

## 2023-12-22 MED ORDER — FOLIC ACID 1 MG PO TABS
1.0000 mg | ORAL_TABLET | Freq: Every day | ORAL | Status: DC
  Administered 2023-12-22 – 2023-12-25 (×4): 1 mg
  Filled 2023-12-22 (×4): qty 1

## 2023-12-22 MED ORDER — FENTANYL CITRATE PF 50 MCG/ML IJ SOSY
25.0000 ug | PREFILLED_SYRINGE | Freq: Once | INTRAMUSCULAR | Status: AC
  Administered 2023-12-22: 25 ug via INTRAVENOUS
  Filled 2023-12-22: qty 1

## 2023-12-22 MED ORDER — OSMOLITE 1.5 CAL PO LIQD
1000.0000 mL | ORAL | Status: DC
  Administered 2023-12-22: 1000 mL

## 2023-12-22 MED ORDER — PANTOPRAZOLE SODIUM 40 MG IV SOLR
40.0000 mg | Freq: Every day | INTRAVENOUS | Status: DC
  Administered 2023-12-22 – 2023-12-26 (×5): 40 mg via INTRAVENOUS
  Filled 2023-12-22 (×5): qty 10

## 2023-12-22 MED ORDER — PROSOURCE TF20 ENFIT COMPATIBL EN LIQD
60.0000 mL | Freq: Two times a day (BID) | ENTERAL | Status: DC
  Administered 2023-12-22: 60 mL
  Filled 2023-12-22 (×4): qty 60

## 2023-12-22 MED ORDER — AMLODIPINE BESYLATE 5 MG PO TABS
10.0000 mg | ORAL_TABLET | Freq: Every day | ORAL | Status: DC
  Administered 2023-12-22 – 2023-12-25 (×4): 10 mg
  Filled 2023-12-22: qty 2
  Filled 2023-12-22 (×2): qty 1
  Filled 2023-12-22: qty 2

## 2023-12-22 MED ORDER — CARVEDILOL 12.5 MG PO TABS
25.0000 mg | ORAL_TABLET | Freq: Two times a day (BID) | ORAL | Status: DC
  Administered 2023-12-22 – 2023-12-25 (×6): 25 mg
  Filled 2023-12-22 (×6): qty 2

## 2023-12-22 MED ORDER — LOSARTAN POTASSIUM 50 MG PO TABS
100.0000 mg | ORAL_TABLET | Freq: Every day | ORAL | Status: DC
  Administered 2023-12-22 – 2023-12-25 (×4): 100 mg
  Filled 2023-12-22 (×4): qty 2

## 2023-12-22 NOTE — Plan of Care (Signed)
  Problem: Self-Care: Goal: Ability to participate in self-care as condition permits will improve Outcome: Progressing   Problem: Nutrition: Goal: Risk of aspiration will decrease Outcome: Progressing Goal: Dietary intake will improve Outcome: Progressing   Problem: Clinical Measurements: Goal: Will remain free from infection Outcome: Progressing Goal: Diagnostic test results will improve Outcome: Progressing   Problem: Nutrition: Goal: Adequate nutrition will be maintained Outcome: Progressing   Problem: Coping: Goal: Level of anxiety will decrease Outcome: Progressing

## 2023-12-22 NOTE — Progress Notes (Signed)
 Initial Nutrition Assessment  DOCUMENTATION CODES:   Non-severe (moderate) malnutrition in context of chronic illness  INTERVENTION:   Initiate tube feeding via Cortrak tube: Osmolite 1.5 at 25 ml/h and increase by 10 ml every 8 hours to goal rate of 55 ml/hr (1320 ml per day)  Prosource TF20 60 ml BID  Provides 2140 kcal, 122 gm protein, 1003 ml free water daily  Continue thiamine  and folic acid  Add MVI with minerals daily  Monitor magnesium  and phosphorus every day x  4 occurrences, MD to replete as needed, as pt is at risk for refeeding syndrome given pt meets criteria for moderate malnutrition.   NUTRITION DIAGNOSIS:   Moderate Malnutrition related to chronic illness (cirrhosis) as evidenced by moderate muscle depletion, moderate fat depletion.  GOAL:   Patient will meet greater than or equal to 90% of their needs  MONITOR:   TF tolerance  REASON FOR ASSESSMENT:   Consult Enteral/tube feeding initiation and management  ASSESSMENT:   Pt with PMH of ETOH use and liver dz (likely underlying cirrhosis) admitted for nontraumatic R ICH with brain compression.    Pt discussed during ICU rounds and with RN and MD.  Pt and family member sleeping soundly. Pt did not arouse during exam.   5/10 - concerns for ETOH withdrawal; waxing and waning agitation  5/16 - s/p cortrak placement; tip gastric    Medications reviewed and include: 1 mg folic acid, 30 g lactulose BID, MVI with minerals, protonix , thiamine  100 mg daily (previously received 500 mg every 8 hours 5/12 -5/13) Hypertonic saline   Labs reviewed:  AST: 160 ALT: 179 CBG's: 103  NUTRITION - FOCUSED PHYSICAL EXAM:  Flowsheet Row Most Recent Value  Orbital Region No depletion  Upper Arm Region Moderate depletion  Thoracic and Lumbar Region Severe depletion  Buccal Region No depletion  Temple Region No depletion  Clavicle Bone Region Moderate depletion  Clavicle and Acromion Bone Region Moderate  depletion  Scapular Bone Region Unable to assess  Dorsal Hand Unable to assess  Patellar Region Mild depletion  Anterior Thigh Region Mild depletion  Posterior Calf Region Moderate depletion  Edema (RD Assessment) None  Hair Reviewed  Eyes Unable to assess  Mouth Unable to assess  Skin Reviewed  Nails Reviewed       Diet Order:   Diet Order             DIET DYS 2 Room service appropriate? No; Fluid consistency: Thin  Diet effective now                   EDUCATION NEEDS:   Not appropriate for education at this time  Skin:  Skin Assessment: Reviewed RN Assessment  Last BM:  5/14 via FMS; no documented BM after 5/14  Height:   Ht Readings from Last 1 Encounters:  06/13/23 5\' 6"  (1.676 m)    Weight:   Wt Readings from Last 1 Encounters:  12/16/23 63.4 kg    BMI:  Body mass index is 22.56 kg/m.  Estimated Nutritional Needs:   Kcal:  2100-2300  Protein:  100-120 grams  Fluid:  > 2 L/day  Randine Butcher., RD, LDN, CNSC See AMiON for contact information

## 2023-12-22 NOTE — Progress Notes (Signed)
 Reported that patient removed Cortrak right before shift change., offgoing RN told to hold off on replacing tube, since patient is able to eat with assistance.

## 2023-12-22 NOTE — Plan of Care (Signed)
  Problem: Education: Goal: Knowledge of disease or condition will improve Outcome: Progressing Goal: Knowledge of secondary prevention will improve (MUST DOCUMENT ALL) Outcome: Progressing Goal: Knowledge of patient specific risk factors will improve (DELETE if not current risk factor) Outcome: Progressing   Problem: Intracerebral Hemorrhage Tissue Perfusion: Goal: Complications of Intracerebral Hemorrhage will be minimized Outcome: Progressing   Problem: Coping: Goal: Will verbalize positive feelings about self Outcome: Progressing Goal: Will identify appropriate support needs Outcome: Progressing   Problem: Coping: Goal: Will identify appropriate support needs Outcome: Progressing   Problem: Health Behavior/Discharge Planning: Goal: Ability to manage health-related needs will improve Outcome: Progressing Goal: Goals will be collaboratively established with patient/family Outcome: Progressing   Problem: Self-Care: Goal: Ability to participate in self-care as condition permits will improve Outcome: Progressing Goal: Verbalization of feelings and concerns over difficulty with self-care will improve Outcome: Progressing Goal: Ability to communicate needs accurately will improve Outcome: Progressing   Problem: Nutrition: Goal: Risk of aspiration will decrease Outcome: Progressing Goal: Dietary intake will improve Outcome: Progressing   Problem: Education: Goal: Knowledge of General Education information will improve Description: Including pain rating scale, medication(s)/side effects and non-pharmacologic comfort measures Outcome: Progressing   Problem: Health Behavior/Discharge Planning: Goal: Ability to manage health-related needs will improve Outcome: Progressing   Problem: Clinical Measurements: Goal: Ability to maintain clinical measurements within normal limits will improve Outcome: Progressing Goal: Will remain free from infection Outcome:  Progressing Goal: Diagnostic test results will improve Outcome: Progressing Goal: Respiratory complications will improve Outcome: Progressing Goal: Cardiovascular complication will be avoided Outcome: Progressing   Problem: Clinical Measurements: Goal: Will remain free from infection Outcome: Progressing

## 2023-12-22 NOTE — Progress Notes (Signed)
 Inpatient Rehabilitation Admissions Coordinator   I continue to follow patient's progress from a distance. We will follow up next week with his progress to assist in determining rehab venue options.  Jeannetta Millman, RN, MSN Rehab Admissions Coordinator [phone] 12/22/2023 4:27 PM

## 2023-12-22 NOTE — Progress Notes (Addendum)
 STROKE TEAM PROGRESS NOTE   INTERIM HISTORY/SUBJECTIVE Family at the bedside.  Patient is laying in the bed in no apparent distress.  Has mitt and restraint on right hand.  He did receive 1 mg of Ativan  this morning at 8 AM  Repeat CT head this morning is stable. Will taper 3% saline today.  Last sodium 137  LFTs remain elevated Neurological exam remains stable  OBJECTIVE  CBC    Component Value Date/Time   WBC 5.9 12/22/2023 0631   RBC 4.33 12/22/2023 0631   HGB 13.2 12/22/2023 0631   HCT 40.3 12/22/2023 0631   PLT 70 (L) 12/22/2023 0631   MCV 93.1 12/22/2023 0631   MCH 30.5 12/22/2023 0631   MCHC 32.8 12/22/2023 0631   RDW 14.0 12/22/2023 0631   LYMPHSABS 0.9 12/16/2023 0024   MONOABS 0.6 12/16/2023 0024   EOSABS 0.1 12/16/2023 0024   BASOSABS 0.0 12/16/2023 0024    BMET    Component Value Date/Time   NA 137 12/22/2023 0631   K 3.6 12/22/2023 0631   CL 109 12/22/2023 0631   CO2 19 (L) 12/22/2023 0631   GLUCOSE 95 12/22/2023 0631   BUN 10 12/22/2023 0631   CREATININE 0.67 12/22/2023 0631   CALCIUM 8.6 (L) 12/22/2023 0631   GFRNONAA >60 12/22/2023 0631    IMAGING past 24 hours VAS US  LOWER EXTREMITY VENOUS (DVT) Result Date: 12/22/2023  Lower Venous DVT Study Patient Name:  Troy Bishop  Date of Exam:   12/21/2023 Medical Rec #: [id number]  Accession #:    [id number] Date of Birth: 1990-12-13   Patient Gender: M Patient Age:   66 years Exam Location:  [facility], [place] Procedure:      VAS US  LOWER EXTREMITY VENOUS (DVT) Referring Phys: Jannet Memos --------------------------------------------------------------------------------  Indications: Edema.  Limitations: Patient position / movement. Comparison Study: No previous exams Performing Technologist: Jody Hill RVT, RDMS  Examination Guidelines: A complete evaluation includes B-mode imaging, spectral Doppler, color Doppler, and power Doppler as needed of all accessible portions of each vessel. Bilateral testing is considered  an integral part of a complete examination. Limited examinations for reoccurring indications may be performed as noted. The reflux portion of the exam is performed with the patient in reverse Trendelenburg.  +---------+---------------+---------+-----------+----------+-------------------+ RIGHT    CompressibilityPhasicitySpontaneityPropertiesThrombus Aging      +---------+---------------+---------+-----------+----------+-------------------+ CFV      Full           Yes      Yes                                      +---------+---------------+---------+-----------+----------+-------------------+ SFJ      Full                                         Not well visualized +---------+---------------+---------+-----------+----------+-------------------+ FV Prox  Full           Yes      Yes                                      +---------+---------------+---------+-----------+----------+-------------------+ FV Mid   Full           Yes      Yes                                      +---------+---------------+---------+-----------+----------+-------------------+  FV DistalFull           Yes      Yes                                      +---------+---------------+---------+-----------+----------+-------------------+ PFV      Full                                                             +---------+---------------+---------+-----------+----------+-------------------+ POP      Full           Yes      Yes                                      +---------+---------------+---------+-----------+----------+-------------------+ PTV      Full                                                             +---------+---------------+---------+-----------+----------+-------------------+ PERO     Full                                                             +---------+---------------+---------+-----------+----------+-------------------+ Difficulty imagig left leg due to  patient movement  +---------+---------------+---------+-----------+----------+--------------+ LEFT     CompressibilityPhasicitySpontaneityPropertiesThrombus Aging +---------+---------------+---------+-----------+----------+--------------+ CFV      Full           Yes      Yes                                 +---------+---------------+---------+-----------+----------+--------------+ SFJ      Full                                                        +---------+---------------+---------+-----------+----------+--------------+ FV Prox  Full           Yes      Yes                                 +---------+---------------+---------+-----------+----------+--------------+ FV Mid   Full           Yes      Yes                                 +---------+---------------+---------+-----------+----------+--------------+ FV DistalFull           Yes      Yes                                 +---------+---------------+---------+-----------+----------+--------------+  PFV      Full                                                        +---------+---------------+---------+-----------+----------+--------------+ POP      Full           Yes      Yes                                 +---------+---------------+---------+-----------+----------+--------------+ PTV      Full                                                        +---------+---------------+---------+-----------+----------+--------------+ PERO     Full                                                        +---------+---------------+---------+-----------+----------+--------------+     Summary: BILATERAL: - No evidence of deep vein thrombosis seen in the lower extremities, bilaterally. -No evidence of popliteal cyst, bilaterally.   *See table(s) above for measurements and observations. Electronically signed by Delaney Fearing on 12/22/2023 at 9:36:33 AM.    Final    CT HEAD WO CONTRAST ( ) Result Date:  12/22/2023 CLINICAL DATA:  33 year old male code stroke presentation on 12/16/2023 with right hemisphere intra-axial hemorrhage. Subsequent encounter. EXAM: CT HEAD WITHOUT CONTRAST TECHNIQUE: Contiguous axial images were obtained from the base of the skull through the vertex without intravenous contrast. RADIATION DOSE REDUCTION: This exam was performed according to the departmental dose-optimization program which includes automated exposure control, adjustment of the mA and/or kV according to patient size and/or use of iterative reconstruction technique. COMPARISON:  Brain MRI 12/19/2023 and earlier. FINDINGS: Brain: Large and lobulated hyperdense right hemisphere intra-axial hemorrhage with epicenter at the right lentiform and/or thalamus now encompasses 57 x 54 by 54 mm (AP by transverse by CC) for an estimated blood volume of 83 mL). This was 58 x 56 by 52 mm on 12/17/2023 CT (85 mL). Surrounding hypodense edema has not significantly changed. Regional mass effect with effacement of the right lateral ventricle and leftward midline shift are both slightly improved (midline shift previously 7 mm, now 6 mm). Trace intraventricular blood on MRI is not apparent. Stable basilar cistern patency, partially effaced suprasellar cistern. No ventriculomegaly. Stable gray-white differentiation elsewhere. No new intracranial hemorrhage. Vascular: No suspicious intracranial vascular hyperdensity. Skull: Chronic left lamina papyracea fracture. Calvarium is intact. No acute osseous abnormality identified. Sinuses/Orbits: Maxillary sinus mucosal thickening. Chronic appearing right mastoid sclerosis. Other Visualized paranasal sinuses and mastoids are stable and well aerated. Other: Orbit and scalp soft tissues appears stable and negative. IMPRESSION: 1. Large right hemisphere intra-axial hemorrhage with minimal retraction since 12/17/2023. Stable surrounding edema with decreased leftward midline shift by about 1 mm. 2. Stable  basilar cisterns, no progressive IVH, and no new intracranial abnormality identified. Electronically Signed   By: Marlise Simpers M.D.   On: 12/22/2023 04:50  Vitals:   12/22/23 1015 12/22/23 1100 12/22/23 1124 12/22/23 1200  BP: (!) 151/109 (!) 185/115 (!) 154/103 (!) 146/92  Pulse:  90 84 83  Resp:  14 17 18   Temp:    99.4 F (37.4 C)  TempSrc:    Oral  SpO2:  98% 97% 98%  Weight:         PHYSICAL EXAM General: Ill-appearing patient in no acute distress Psych: Calm and cooperative CV: Regular, tachycardic rate and rhythm on monitor Respiratory:  Regular, unlabored respirations on room air  NEURO:  Drowsy arouses easily.  Opens eyes remain open.,  He is oriented to age, place, year and month.  Follows commands.  Speech is dysarthric.  Still moving right upper and lower extremity spontaneously, however flaccid on the left.   ASSESSMENT/PLAN  Mr. Trendell Leahy is a 33 y.o. male with history of hypertension, seizure and daily alcohol use admitted for acute onset left-sided weakness.  He was found to have a large right subcortical ICH.  On later CT angiogram, focus of contrast-enhancement within cerebral hematoma was found, and repeat CT demonstrated enlargement of ICH.  He received platelet transfusion for platelet count of 54.  However, follow-up CT in the morning of 5/10 revealed stable ICH.  Neurosurgery was consulted but has declined to intervene.  Cleviprex was used for blood pressure control, and hypertonic saline was initiated to decrease edema.  In the morning of 5/10, patient became restless, agitated, diaphoretic and developed nausea.  He reports that he has 2-3 drinks of alcohol daily, and phenobarbital taper along with Precedex drip were initiated to treat alcohol withdrawal.  NIH on Admission 18, ICH score 1  ICH:  right BG large ICH, etiology: Likely hypertensive Code Stroke CT head 35 mm ICH in right frontotemporal region CTA head & neck focus of contrast enhancement within bed  of right cerebral hematoma, no other underlying vascular abnormality Repeat CT head interval increase in size of right cerebral hemorrhage, now estimated volume 52 mL Repeat CT head 5/10 unchanged right cerebral hematoma with 6 mm of midline shift Repeat CT head 5/16 stable MRI stable hematoma and MLS, no abnormal enhancement  2D Echo EF 60 to 65% LDL 124 HgbA1c 5.6 UDS positive for barbiturates [facility] medication) VTE prophylaxis - continue heparin subcu No antithrombotic prior to admission,  No antithrombotic secondary to ICH Therapy recommendations: CIR Disposition: Pending  Cerebral edema Large right ICH with 6 mm midline shift noted on CT 5/10 and 5/11 Continue 3% hypertonic saline at 75->40 cc/h -> will taper off today Monitor sodium every 6 hours  Na 142-> 138->132->136->138->137-137 MRI stable hematoma and MLS  Hypertension Home meds: None Cleviprex drip is off As needed hydralazine and labetalol On amlodipine 10 mg daily Add cozaar 50->100 Add coreg 6.25--12.5--25mg  BID  BP goal less than 160 Long-term BP goal normotensive  Hyperlipidemia Home meds: None LDL 124, goal < 70 Consider statin once LFT normalizes  Alcohol withdrawal Patient states he drinks 2-3 drinks per day EtOH level on admission 264 Patient noted to be tachycardic, diaphoretic, agitated, restless with nausea and vomiting on 5/10 CCM consulted and phenobarbital taper initiated for EtOH withdrawal Precedex stopped, CIWA protocol Ativan  in place.  Still agitated 5/15 on restrain and ativan  PRN ETOH abuse cessation discussed this morning with patient and wife at bedside.   Thrombocytopenia Elevated LFTs Platelet 56--60--49--46-46--55-70 Status post platelet transfusion x 1 Likely related to chronic alcohol use CBC monitoring AST/ALT 146/144--114/119--106/118--122/126--130/169-160/179  Fever, likely central fever Tmax 101.2--  101.1--100-100.9--100.6-100.4 CXR negative UA negative Monitor  fever curve  Dysphagia On dysphagia 2 and thin liquid However patient not eating adequately Encourage p.o. intake Core Trak tube placed today.  Will start tube feeds  Other Active Problems Hypokalemia, K3.2--3.7--3.0->3.3 - supplement  Hospital day # 6   Jonette Nestle DNP, ACNPC-AG  [facility]  ATTENDING NOTE: I reviewed above note and agree with the assessment and plan. Pt was seen and examined.   Wife and family member at bedside.  Patient initially drowsy after Ativan  for alcohol withdrawal, then more awake alert on voice and tactile stimulation.  Neurologically unchanged still has left hemiplegia and orientated.  Plan to taper off 3% saline today.  CT repeat this morning stable.  Continue CIWA protocol for now.  BP still on the higher end, on 3 BP meds and IV BP as needed.  Continue to monitor.  For detailed assessment and plan, please refer to above as I have made changes wherever appropriate.   Consuelo Denmark, MD PhD Stroke Neurology 12/22/2023 4:36 PM  This patient is critically ill due to large ICH, alcohol withdrawal, thrombocytopenia, elevated LFTs and fever and at significant risk of neurological worsening, death form brain herniation, hematoma expansion, DT, liver failure, bleeding from thrombocytopenia. This patient's care requires constant monitoring of vital signs, hemodynamics, respiratory and cardiac monitoring, review of multiple databases, neurological assessment, discussion with family, other specialists and medical decision making of high complexity. I spent 35 minutes of neurocritical care time in the care of this patient. I had long discussion with wife at bedside, updated pt current condition, treatment plan and potential prognosis, and answered all the questions.  She expressed understanding and appreciation.    To contact Stroke Continuity provider, please refer to [email or web address]. After hours, contact General Neurology

## 2023-12-22 NOTE — Progress Notes (Signed)
 SLP Cancellation Note  Patient Details Name: Troy Bishop MRN: [id number] DOB: 03-05-1991   Cancelled treatment:       Reason Eval/Treat Not Completed: Patient's level of consciousness (Pt remains difficult to rouse with limited PO intake. Cortrak placed this AM. Will continue efforts as appropriate.)  Dia Forget, M.S., CCC-SLP Speech-Language Pathologist Secure Chat Preferred  O: [phone]  [name] 12/22/2023, 10:41 AM

## 2023-12-22 NOTE — Procedures (Signed)
 Cortrak  Person Inserting Tube:  Louetta Roux, RD Tube Type:  Cortrak - 43 inches Tube Size:  10 Tube Location:  Left nare Initial Placement:  Stomach Secured by: Bridle Technique Used to Measure Tube Placement:  Marking at nare/corner of mouth Cortrak Secured At:  69 cm   Cortrak Tube Team Note:  Consult received to place a Cortrak feeding tube.   No x-ray is required. RN may begin using tube.   If the tube becomes dislodged please keep the tube and contact the Cortrak team at [email or web address] for replacement.  If after hours and replacement cannot be delayed, place a NG tube and confirm placement with an abdominal x-ray.    Ernestina Headland, MS, RD, LDN Registered Dietitian II Please see AMiON for contact information.

## 2023-12-23 DIAGNOSIS — G936 Cerebral edema: Secondary | ICD-10-CM | POA: Diagnosis not present

## 2023-12-23 DIAGNOSIS — I10 Essential (primary) hypertension: Secondary | ICD-10-CM | POA: Diagnosis not present

## 2023-12-23 DIAGNOSIS — I69391 Dysphagia following cerebral infarction: Secondary | ICD-10-CM | POA: Diagnosis not present

## 2023-12-23 DIAGNOSIS — E440 Moderate protein-calorie malnutrition: Secondary | ICD-10-CM | POA: Insufficient documentation

## 2023-12-23 DIAGNOSIS — I610 Nontraumatic intracerebral hemorrhage in hemisphere, subcortical: Secondary | ICD-10-CM | POA: Diagnosis not present

## 2023-12-23 LAB — GLUCOSE, CAPILLARY
Glucose-Capillary: 92 mg/dL (ref 70–99)
Glucose-Capillary: 89 mg/dL (ref 70–99)
Glucose-Capillary: 110 mg/dL — ABNORMAL HIGH (ref 70–99)
Glucose-Capillary: 115 mg/dL — ABNORMAL HIGH (ref 70–99)
Glucose-Capillary: 142 mg/dL — ABNORMAL HIGH (ref 70–99)

## 2023-12-23 LAB — MAGNESIUM: Magnesium: 2.2 mg/dL (ref 1.7–2.4)

## 2023-12-23 LAB — PHOSPHORUS: Phosphorus: 3.6 mg/dL (ref 2.5–4.6)

## 2023-12-23 LAB — COMPREHENSIVE METABOLIC PANEL WITH GFR
ALT: 268 U/L — ABNORMAL HIGH (ref 0–44)
AST: 232 U/L — ABNORMAL HIGH (ref 15–41)
Albumin: 3 g/dL — ABNORMAL LOW (ref 3.5–5.0)
Alkaline Phosphatase: 75 U/L (ref 38–126)
Anion gap: 11 (ref 5–15)
BUN: 15 mg/dL (ref 6–20)
CO2: 20 mmol/L — ABNORMAL LOW (ref 22–32)
Calcium: 8.8 mg/dL — ABNORMAL LOW (ref 8.9–10.3)
Chloride: 104 mmol/L (ref 98–111)
Creatinine, Ser: 0.61 mg/dL (ref 0.61–1.24)
GFR, Estimated: >60 mL/min (ref >60)
Glucose, Bld: 96 mg/dL (ref 70–99)
Potassium: 3.3 mmol/L — ABNORMAL LOW (ref 3.5–5.1)
Sodium: 135 mmol/L (ref 135–145)
Total Bilirubin: 1.6 mg/dL — ABNORMAL HIGH (ref 0.0–1.2)
Total Protein: 7.5 g/dL (ref 6.5–8.1)

## 2023-12-23 LAB — SODIUM: Sodium: 136 mmol/L (ref 135–145)

## 2023-12-23 MED ORDER — ORAL CARE MOUTH RINSE: 15.0000 mL | OROMUCOSAL | Status: DC | PRN

## 2023-12-23 MED ORDER — ORAL CARE MOUTH RINSE
15.0000 mL | OROMUCOSAL | Status: DC
  Administered 2023-12-23 – 2023-12-30 (×26): 15 mL via OROMUCOSAL

## 2023-12-23 MED ORDER — THIAMINE MONONITRATE 100 MG PO TABS
100.0000 mg | ORAL_TABLET | Freq: Every day | ORAL | Status: DC
  Administered 2023-12-24 – 2023-12-25 (×2): 100 mg
  Filled 2023-12-23 (×2): qty 1

## 2023-12-23 MED ORDER — LORAZEPAM 2 MG/ML IJ SOLN
1.0000 mg | Freq: Four times a day (QID) | INTRAMUSCULAR | Status: DC | PRN
  Administered 2023-12-23 (×2): 1 mg via INTRAVENOUS
  Filled 2023-12-23 (×2): qty 1

## 2023-12-23 MED ORDER — POTASSIUM CHLORIDE 20 MEQ PO PACK
40.0000 meq | PACK | Freq: Four times a day (QID) | ORAL | Status: AC
  Administered 2023-12-23 (×2): 40 meq via ORAL
  Filled 2023-12-23 (×2): qty 2

## 2023-12-23 NOTE — Plan of Care (Signed)
  Problem: Intracerebral Hemorrhage Tissue Perfusion: Goal: Complications of Intracerebral Hemorrhage will be minimized Outcome: Progressing   Problem: Education: Goal: Knowledge of disease or condition will improve Outcome: Not Progressing Goal: Knowledge of secondary prevention will improve (MUST DOCUMENT ALL) Outcome: Not Progressing Goal: Knowledge of patient specific risk factors will improve (DELETE if not current risk factor) Outcome: Not Progressing   Problem: Coping: Goal: Will verbalize positive feelings about self Outcome: Not Progressing Goal: Will identify appropriate support needs Outcome: Not Progressing   Problem: Health Behavior/Discharge Planning: Goal: Ability to manage health-related needs will improve Outcome: Not Progressing Goal: Goals will be collaboratively established with patient/family Outcome: Not Progressing

## 2023-12-23 NOTE — Progress Notes (Addendum)
 STROKE TEAM PROGRESS NOTE   INTERIM HISTORY/SUBJECTIVE His father is at the bedside.  Patient is lying in bed in no distress.  Patient has met in restraint on right wrist.  Neurological exam is unchanged with dense left hemiplegia K3.3 will replace.  Tmax 100.7 overnight Liver enzymes still elevated Vitals are stable Will transfer out of the ICU today and ask hospitalist to assume care of patient tomorrow  CBC    Component Value Date/Time   WBC 5.9 12/22/2023 0631   RBC 4.33 12/22/2023 0631   HGB 13.2 12/22/2023 0631   HCT 40.3 12/22/2023 0631   PLT 70 (L) 12/22/2023 0631   MCV 93.1 12/22/2023 0631   MCH 30.5 12/22/2023 0631   MCHC 32.8 12/22/2023 0631   RDW 14.0 12/22/2023 0631   LYMPHSABS 0.9 12/16/2023 0024   MONOABS 0.6 12/16/2023 0024   EOSABS 0.1 12/16/2023 0024   BASOSABS 0.0 12/16/2023 0024    BMET    Component Value Date/Time   NA 135 12/23/2023 0732   K 3.3 (L) 12/23/2023 0732   CL 104 12/23/2023 0732   CO2 20 (L) 12/23/2023 0732   GLUCOSE 96 12/23/2023 0732   BUN 15 12/23/2023 0732   CREATININE 0.61 12/23/2023 0732   CALCIUM  8.8 (L) 12/23/2023 0732   GFRNONAA >60 12/23/2023 0732    IMAGING past 24 hours No results found.    Vitals:   12/23/23 0800 12/23/23 0832 12/23/23 0900 12/23/23 1000  BP: (!) 146/102 (!) 145/98 131/75 (!) 139/92  Pulse: 90 88 96 89  Resp: (!) 22  (!) 24 19  Temp:      TempSrc:      SpO2: 99%  99% 98%  Weight:         PHYSICAL EXAM General: Ill-appearing patient in no acute distress Psych: Calm and cooperative CV: Regular, tachycardic rate and rhythm on monitor Respiratory:  Regular, unlabored respirations on room air  NEURO:  Drowsy arouses easily.  Opens eyes remain open.,  He is oriented to age, city, state follows commands.  Left facial droop speech is dysarthric.  Right arm and right leg with spontaneous movement, left upper extremity can slightly move fingers, left lower leg no movement to noxious stimuli.   Decreased sensation on left   ASSESSMENT/PLAN  Mr. Ariyan Brisendine is a 33 y.o. male with history of hypertension, seizure and daily alcohol use admitted for acute onset left-sided weakness.  He was found to have a large right subcortical ICH.  On later CT angiogram, focus of contrast-enhancement within cerebral hematoma was found, and repeat CT demonstrated enlargement of ICH.  He received platelet transfusion for platelet count of 54.  However, follow-up CT in the morning of 5/10 revealed stable ICH.  Neurosurgery was consulted but has declined to intervene.  Cleviprex  was used for blood pressure control, and hypertonic saline was initiated to decrease edema.  In the morning of 5/10, patient became restless, agitated, diaphoretic and developed nausea.  He reports that he has 2-3 drinks of alcohol daily, and phenobarbital  taper along with Precedex  drip were initiated to treat alcohol withdrawal.  NIH on Admission 18, ICH score 1  ICH:  right BG large ICH, etiology: Likely hypertensive Code Stroke CT head 35 mm ICH in right frontotemporal region CTA head & neck focus of contrast enhancement within bed of right cerebral hematoma, no other underlying vascular abnormality Repeat CT head interval increase in size of right cerebral hemorrhage, now estimated volume 52 mL Repeat CT head 5/10 unchanged right  cerebral hematoma with 6 mm of midline shift Repeat CT head 5/16 stable MRI stable hematoma and MLS, no abnormal enhancement  2D Echo EF 60 to 65% LDL 124 HgbA1c 5.6 UDS positive for barbiturates [facility] medication) VTE prophylaxis - continue heparin  subcu No antithrombotic prior to admission,  No antithrombotic secondary to ICH Therapy recommendations: CIR Disposition: Pending  Cerebral edema Large right ICH with 6 mm midline shift noted on CT 5/10 and 5/11 Continue 3% hypertonic saline at 75->40 cc/h -> off Monitor sodium every 6 hours  Na 142-> 138->132->136->138->137-137-135 MRI stable  hematoma and MLS  Hypertension Home meds: None Cleviprex  drip is off As needed hydralazine  and labetalol  On amlodipine  10 mg daily Add cozaar  50->100 Add coreg  6.25--12.5--25mg  BID  BP goal less than 160 Long-term BP goal normotensive  Hyperlipidemia Home meds: None LDL 124, goal < 70 Consider statin once LFT normalizes  Alcohol withdrawal Patient states he drinks 2-3 drinks per day EtOH level on admission 264 Patient noted to be tachycardic, diaphoretic, agitated, restless with nausea and vomiting on 5/10 CCM consulted and phenobarbital  taper initiated for EtOH withdrawal Precedex  stopped, CIWA protocol Ativan  in place.  Still agitated 5/15 on restrain and ativan  PRN ETOH abuse cessation discussed this morning with patient and wife at bedside.   Thrombocytopenia Elevated LFTs Platelet 56--60--49--46-46--55-70 Status post platelet transfusion x 1 Likely related to chronic alcohol use CBC monitoring AST/ALT 146/144--114/119--106/118--122/126--130/169-160/179-232/268  Fever, likely central fever Tmax 101.2-- 101.1--100-100.9--100.6-100.4-100.7 CXR negative UA negative Monitor fever curve  Dysphagia On dysphagia 2 and thin liquid However patient not eating adequately Encourage p.o. intake Core Trak tube placed 5/16.  Receiving tube feeds  Other Active Problems Hypokalemia, K3.2--3.7--3.0->3.3 - supplement  Hospital day # 7   Jonette Nestle DNP, ACNPC-AG  [facility] I have personally obtained history,examined this patient, reviewed notes, independently viewed imaging studies, participated in medical decision making and plan of care.ROS completed by me personally and pertinent positives fully documented  I have made any additions or clarifications directly to the above note. Agree with note above.  Patient continues to have dysarthria and left hemiplegia.  Blood pressure adequately controlled.  Recommend mobilize out of bed.  Therapy consults.  BP target  blood pressure goal below 160.  Hypertonic saline has been discontinued  .  Long discussion with patient and father at the bedside and answered questions. This patient is critically ill and at significant risk of neurological worsening, death and care requires constant monitoring of vital signs, hemodynamics,respiratory and cardiac monitoring, extensive review of multiple databases, frequent neurological assessment, discussion with family, other specialists and medical decision making of high complexity.I have made any additions or clarifications directly to the above note.This critical care time does not reflect procedure time, or teaching time or supervisory time of PA/NP/Med Resident etc but could involve care discussion time.  I spent 30 minutes of neurocritical care time  in the care of  this patient.     Ardella Beaver, MD Medical Director [facility] Pager: [phone] 12/23/2023 1:17 PM   To contact Stroke Continuity provider, please refer to [email or web address]. After hours, contact General Neurology

## 2023-12-23 NOTE — Plan of Care (Signed)
  Problem: Self-Care: Goal: Ability to participate in self-care as condition permits will improve Outcome: Progressing   Problem: Nutrition: Goal: Risk of aspiration will decrease Outcome: Progressing Goal: Dietary intake will improve Outcome: Progressing   Problem: Clinical Measurements: Goal: Ability to maintain clinical measurements within normal limits will improve Outcome: Progressing   Problem: Activity: Goal: Risk for activity intolerance will decrease Outcome: Progressing   Problem: Nutrition: Goal: Adequate nutrition will be maintained Outcome: Progressing   Problem: Elimination: Goal: Will not experience complications related to urinary retention Outcome: Progressing

## 2023-12-24 DIAGNOSIS — I69391 Dysphagia following cerebral infarction: Secondary | ICD-10-CM | POA: Diagnosis not present

## 2023-12-24 DIAGNOSIS — I10 Essential (primary) hypertension: Secondary | ICD-10-CM | POA: Diagnosis not present

## 2023-12-24 DIAGNOSIS — G936 Cerebral edema: Secondary | ICD-10-CM | POA: Diagnosis not present

## 2023-12-24 DIAGNOSIS — I610 Nontraumatic intracerebral hemorrhage in hemisphere, subcortical: Secondary | ICD-10-CM | POA: Diagnosis not present

## 2023-12-24 LAB — COMPREHENSIVE METABOLIC PANEL WITH GFR
ALT: 355 U/L — ABNORMAL HIGH (ref 0–44)
AST: 321 U/L — ABNORMAL HIGH (ref 15–41)
Albumin: 3 g/dL — ABNORMAL LOW (ref 3.5–5.0)
Alkaline Phosphatase: 82 U/L (ref 38–126)
Anion gap: 11 (ref 5–15)
BUN: 12 mg/dL (ref 6–20)
CO2: 19 mmol/L — ABNORMAL LOW (ref 22–32)
Calcium: 8.9 mg/dL (ref 8.9–10.3)
Chloride: 105 mmol/L (ref 98–111)
Creatinine, Ser: 0.71 mg/dL (ref 0.61–1.24)
GFR, Estimated: >60 mL/min (ref >60)
Glucose, Bld: 96 mg/dL (ref 70–99)
Potassium: 3.8 mmol/L (ref 3.5–5.1)
Sodium: 135 mmol/L (ref 135–145)
Total Bilirubin: 1.6 mg/dL — ABNORMAL HIGH (ref 0.0–1.2)
Total Protein: 7.5 g/dL (ref 6.5–8.1)

## 2023-12-24 LAB — PHOSPHORUS: Phosphorus: 3.4 mg/dL (ref 2.5–4.6)

## 2023-12-24 LAB — MAGNESIUM: Magnesium: 2.1 mg/dL (ref 1.7–2.4)

## 2023-12-24 NOTE — Hospital Course (Addendum)
 33 yo male with past medical history of hypertension, seizure and daily alcohol use admitted for acute onset left-sided weakness. He was found to have a large right subcortical ICH. admitted on 5/10. Repeat Ct head with enlargement of ICH and he received PLT transfusion for Plt 54 . was on clevipres has been off for multiple days and started on Po Meds. On CIWA protocol for withdrawal. core track tube placed with TF infusing. Elevated liver enzymes, fevers which are likely central. Therapy recommending CIR   Subjective: Seen and examined Wife at the bedside Overnight afebrile vital stable LFTs slowly improving He is waiting for CIR Complains of pain in the left hip  Assessment and plan:  Right basal ganglia large ICH Acute metabolic encephalopathy: Likely hypertensive ICH. Patient admitted under code stroke complaints CT heads CTA head and neck as below Code Stroke CT head 35 mm ICH in right frontotemporal region CTA head & neck focus of contrast enhancement within bed of right cerebral hematoma, no other underlying vascular abnormality Repeat CT head> increase in size of right cerebral hemorrhage, estimated volume 52 mL Repeat CT head 5/10 unchanged right cerebral hematoma with 6 mm of midline shift Repeat CT head 5/16 stable MRI stable hematoma and MLS, no abnormal enhancement  Stroke workup completed :TTE normal EF, LDL 124 A1c 5.6 No antithrombotic prior to admission,  No antithrombotic secondary to ICH Continue PT OT and extensive rehabilitation awaiting for CIR patient and family opposed to SNF  Continue Seroquel  50 twice daily, as needed sedatives  Cerebral edema: Due to Large right ICH with 6 mm midline shift noted on CT 5/10 and 5/11: S/P 3% hypertonic saline In ICU. MRI stable  Left upper extremity superior thrombophlebitis: Continue supportive care  Left hip pain: Will obtain x-ray, start gabapentin twice daily-watch for sedation  Transient fever, likely central  fever: CXR negative UA negative,monitor.  No recurrence of fever.  Chest x-ray repeat has been unremarkable on 5/20  ? Fever from thrombophlebitis . If febrile again consider oral antibiotics   Hypertension PTA not on meds, In ICU needed Cleviprex  drip. BP well-controlled on current regimen with amlodipine  10 Coreg  25 twice daily, losartan  100. Goal sbp<160   Hyperlipidemia: LDL 124, goal < 70.  Not on statin start once LFT improves also Treated  Hypokalemia: Resolved.  Alcohol withdrawal W/ alcohol abuse drinking 2-3/d EtOH level on admission 264. Initially on Precedex  on CIWA protocol Ativan .  At this time overall stable continue Ativan  as needed, Seroquel  ETOH abuse cessation has been discussed    Thrombocytopenia: Likely from underlying liver dysfunction/alcohol abuse, rs/p platelet transfusion x 1. Now  Improved  Elevated LFTs: Transaminitis likely from alcohol abuse -LFTs very slow to improve.  Continue to monitor intermittently.   Recent Labs  Lab 12/22/23 0631 12/23/23 0732 12/24/23 0849 12/25/23 0625 12/26/23 0631 12/27/23 0519 12/28/23 0501  AST 160*   < > 321* 235* 185* 241* 212*  ALT 179*   < > 355* 372* 341* 429* 404*  ALKPHOS 65   < > 82 93 92 100 89  BILITOT 1.6*   < > 1.6* 1.2 0.9 0.9 0.7  PROT 7.2   < > 7.5 7.5 7.4 7.5 7.6  ALBUMIN 2.9*   < > 3.0* 2.9* 3.0* 3.1* 3.1*  PLT 70*  --   --  189 212 238 268   < > = values in this interval not displayed.   Dysphagia: Cont DYS diet per SLP-was needing core track tube feeding in  ICU 5/16.

## 2023-12-24 NOTE — Plan of Care (Signed)
 Patient medically stable.  Confusion is clearing up and restrains off the whole day.  Still flaccid on left.  Sat edge of bed today but needed a lot of assistance to stay erect.

## 2023-12-24 NOTE — Progress Notes (Signed)
 PROGRESS NOTE Troy Bishop  MVH:[id number] DOB: 06-15-1991 DOA: 12/16/2023 PCP: Pcp, No  Brief Narrative/Hospital Course: 33 yo male with past medical history of hypertension, seizure and daily alcohol use admitted for acute onset left-sided weakness. He was found to have a large right subcortical ICH. admitted on 5/10. Repeat Ct head with enlargement of ICH and he received PLT transfusion for Plt 54 . was on clevipres has been off for multiple days and started on Po Meds. On CIWA protocol for withdrawal. core track tube placed with TF infusing. Elevated liver enzymes, fevers which are likely central. Therapy recommending CIR   Subjective: Patient seen and examined He is resting comfortably, Family at the bedside.  Alert oriented to self but not to date Overnight afebrile not hypoxic BP stable 139-149 systolic Labs reviewed from yesterday mild hypokalemia transaminitis.   Assessment and plan:  Right basal ganglia large ICH Acute metabolic encephalopathy: Likely hypertensive ICH. Patient admitted under code stroke complaints CT heads CTA head and neck as below Code Stroke CT head 35 mm ICH in right frontotemporal region CTA head & neck focus of contrast enhancement within bed of right cerebral hematoma, no other underlying vascular abnormality Repeat CT head interval increase in size of right cerebral hemorrhage, now estimated volume 52 mL Repeat CT head 5/10 unchanged right cerebral hematoma with 6 mm of midline shift Repeat CT head 5/16 stable MRI stable hematoma and MLS, no abnormal enhancement  Stroke workup completed with echo normal EF, LDL 124 A1c 5.6, uds UDS + for barbiturates [facility] medication) No antithrombotic prior to admission,  No antithrombotic secondary to ICH Continue PT OT with plan for CIR  Continue Seroquel  50 twice daily, as needed sedatives  Cerebral edema W/ Large right ICH with 6 mm midline shift noted on CT 5/10 and 5/11: S/P 3% hypertonic saline In ICU.   Continue to monitor sodium MRI stable hematoma and embolus   Hypertension PTA not on meds, In ICU needed Cleviprex  drip. BP well-controlled on amlodipine  10 Coreg  25 twice daily, losartan  100 SBP goal less than 160 AND Long-term BP goal normotensive   Hyperlipidemia: LDL 124, goal < 70.  Not on statin start once LFT improves  Alcohol withdrawal W/ alcohol abuse drinking 2-3/d EtOH level on admission 264. Initially on Precedex  on CIWA protocol Ativan .  At this time overall stable continue Ativan  as needed, Seroquel  ETOH abuse cessation has been discussed    Thrombocytopenia Elevated LFTs: Likely in the setting of alcohol abuse received platelet transfusion x 1, monitor LFTs and labs-labs trending up. Recent Labs  Lab 12/18/23 430 [id number] 2011 12/18/23 0902 12/18/23 0902 12/19/23 0537 12/20/23 0558 12/20/23 1117 12/21/23 0930 12/22/23 0631 12/23/23 0732 12/24/23 0849  AST  --  114*   < > 106* 122*  --  130* 160* 232* 321*  ALT  --  119*   < > 118* 126*  --  169* 179* 268* 355*  ALKPHOS  --  65   < > 71 60  --  76 65 75 82  BILITOT  --  1.8*   < > 2.2* 2.0*  --  1.6* 1.6* 1.6* 1.6*  PROT  --  7.2   < > 8.1 7.4  --  7.5 7.2 7.5 7.5  ALBUMIN  --  3.1*   < > 3.3* 3.1*  --  3.2* 2.9* 3.0* 3.0*  AMMONIA 72*  --   --   --   --  47*  --   --   --   --  INR  --  1.1  --   --   --   --   --   --   --   --   PLT 49*  --   --  46* 46*  --  55* 70*  --   --    < > = values in this interval not displayed.   Fever, likely central fever: CXR negative UA negative,monitor.   Dysphagia:  Cont DYS diet per SLP-was needing core track tube feeding in ICU 5/16.  DVT prophylaxis: heparin  injection 5,000 Units Start: 12/18/23 2215 SCD's Start: 12/16/23 0115 Code Status:   Code Status: Full Code Family Communication: plan of care discussed with patient/family at bedside. Patient status is: Remains hospitalized because of severity of illness Level of care: Telemetry Medical   Dispo: The patient is from:  home            Anticipated disposition: CIR once available.on NGT feed Objective: Vitals last 24 hrs: Vitals:   12/23/23 2007 12/24/23 0001 12/24/23 0348 12/24/23 0853  BP: 139/89 (!) 144/100 (!) 149/99 (!) 140/97  Pulse: 90 96 91 89  Resp: 17 18 18    Temp: 98 F (36.7 C) 97.8 F (36.6 C) 99.2 F (37.3 C) 99 F (37.2 C)  TempSrc:   Oral Oral  SpO2: 97% 98% 100% 99%  Weight:        Physical Examination: General exam: alert awake, older than stated age HEENT:Oral mucosa moist, Ear/Nose WNL grossly Respiratory system: Bilaterally clear BS, no use of accessory muscle Cardiovascular system: S1 & S2 +. Gastrointestinal system: Abdomen soft,  NT,ND,BS+ Nervous System: Alert, awake, oriented x 2-3 following commands.  Flaccid weakness of the left side Extremities: LE edema neg, warm extremities Skin: No rashes,warm. MSK: Normal muscle bulk/tone.   Data Reviewed: I have personally reviewed following labs and imaging studies ( see epic result tab) CBC: Recent Labs  Lab 12/18/23 0643 12/19/23 0537 12/20/23 0558 12/21/23 0930 12/22/23 0631  WBC 4.6 5.7 4.8 5.8 5.9  HGB 13.9 16.1 13.8 14.3 13.2  HCT 41.2 46.6 40.6 41.9 40.3  MCV 91.8 92.1 92.9 92.5 93.1  PLT 49* 46* 46* 55* 70*   CMP: Recent Labs  Lab 12/20/23 0558 12/20/23 1117 12/21/23 0930 12/21/23 1202 12/22/23 0631 12/22/23 1150 12/22/23 1755 12/22/23 2353 12/23/23 0732 12/24/23 0849  NA 139   < > 139   < > 137 137 136 136 135 135  K 3.3*  --  3.3*  --  3.6  --   --   --  3.3* 3.8  CL 109  --  109  --  109  --   --   --  104 105  CO2 20*  --  18*  --  19*  --   --   --  20* 19*  GLUCOSE 100*  --  132*  --  95  --   --   --  96 96  BUN 7  --  9  --  10  --   --   --  15 12  CREATININE 0.60*  --  0.70  --  0.67  --   --   --  0.61 0.71  CALCIUM  8.7*  --  8.8*  --  8.6*  --   --   --  8.8* 8.9  MG 1.9  --  2.1  --  2.1  --   --   --  2.2 2.1  PHOS 2.8  --  3.7  --  4.3  --   --   --  3.6 3.4   < > = values in  this interval not displayed.   GFR: CrCl cannot be calculated (Unknown ideal weight.). Recent Labs  Lab 12/20/23 0558 12/21/23 0930 12/22/23 0631 12/23/23 0732 12/24/23 0849  AST 122* 130* 160* 232* 321*  ALT 126* 169* 179* 268* 355*  ALKPHOS 60 76 65 75 82  BILITOT 2.0* 1.6* 1.6* 1.6* 1.6*  PROT 7.4 7.5 7.2 7.5 7.5  ALBUMIN 3.1* 3.2* 2.9* 3.0* 3.0*   No results for input(s): "LIPASE", "AMYLASE" in the last 168 hours.  Recent Labs  Lab 12/18/23 0643 12/20/23 1117  AMMONIA 72* 47*   Coagulation Profile:  Recent Labs  Lab 12/18/23 0902  INR 1.1   Unresulted Labs (From admission, onward)     Start     Ordered   12/25/23 0500  Comprehensive metabolic panel with GFR  Daily,   R     Question:  Specimen collection method  Answer:  Lab=Lab collect   12/24/23 0900   12/25/23 0500  CBC  Daily,   R     Question:  Specimen collection method  Answer:  Lab=Lab collect   12/24/23 0900   12/23/23 0500  Magnesium   (ICU Tube Feeding: PEPuP )  Daily,   R     Question:  Specimen collection method  Answer:  Lab=Lab collect   12/22/23 1232   12/23/23 0500  Phosphorus  (ICU Tube Feeding: PEPuP )  Daily,   R     Question:  Specimen collection method  Answer:  Lab=Lab collect   12/22/23 1232           Antimicrobials/Microbiology: Anti-infectives (From admission, onward)    None         Component Value Date/Time   SDES URINE, CATHETERIZED 07/31/2012 1953   SPECREQUEST NONE 07/31/2012 1953   CULT NO GROWTH 07/31/2012 1953   REPTSTATUS 08/02/2012 FINAL 07/31/2012 1953    Procedures:  Medications reviewed:  Scheduled Meds:  amLODipine   10 mg Per Tube Daily   carvedilol   25 mg Per Tube BID WC   Chlorhexidine  Gluconate Cloth  6 each Topical Daily   feeding supplement (PROSource TF20)  60 mL Per Tube BID   folic acid   1 mg Per Tube Daily   heparin  injection (subcutaneous)  5,000 Units Subcutaneous Q8H   lactulose   30 g Per Tube BID   losartan   100 mg Per Tube Daily    multivitamin with minerals  1 tablet Per Tube Daily   nicotine   21 mg Transdermal Daily   ondansetron  (ZOFRAN ) IV  4 mg Intravenous Once   mouth rinse  15 mL Mouth Rinse 4 times per day   pantoprazole  (PROTONIX ) IV  40 mg Intravenous QHS   QUEtiapine   50 mg Per Tube BID   sodium chloride  flush  3 mL Intravenous Once   thiamine   100 mg Per Tube Daily   Continuous Infusions:  clevidipine  Stopped (12/20/23 0736)   feeding supplement (OSMOLITE 1.5 CAL) Stopped (12/22/23 1900)  Lesa Rape, MD [facility] 12/24/2023, 11:33 AM

## 2023-12-24 NOTE — Progress Notes (Signed)
 Order for soft wrist restraints to right arm and leg was discontinue due to patient resting comfortably.   Around 2234 patient began to be very agitated pulled off tele monitor and primfit. Family at bedside was unable to reorient patient.  Nursing staff could not keep patient from wanting to leave hospital and from taking taking off tele monitor.   MD was notified that patient would need a new order for restraints.   MD ordered soft wrist restraints per request.

## 2023-12-24 NOTE — Progress Notes (Signed)
 STROKE TEAM PROGRESS NOTE   INTERIM HISTORY/SUBJECTIVE His father and wife are at the bedside.  Patient is sitting up in bed in no distress eating his meal.. .  Neurological exam is unchanged with dense left hemiplegia but able to move left leg off the bed   Vitals are stable.  Blood pressure adequately controlled Was transferred out of the ICU last night and  hospitalist team has assumed care now. CBC    Component Value Date/Time   WBC 5.9 12/22/2023 0631   RBC 4.33 12/22/2023 0631   HGB 13.2 12/22/2023 0631   HCT 40.3 12/22/2023 0631   PLT 70 (L) 12/22/2023 0631   MCV 93.1 12/22/2023 0631   MCH 30.5 12/22/2023 0631   MCHC 32.8 12/22/2023 0631   RDW 14.0 12/22/2023 0631   LYMPHSABS 0.9 12/16/2023 0024   MONOABS 0.6 12/16/2023 0024   EOSABS 0.1 12/16/2023 0024   BASOSABS 0.0 12/16/2023 0024    BMET    Component Value Date/Time   NA 135 12/24/2023 0849   K 3.8 12/24/2023 0849   CL 105 12/24/2023 0849   CO2 19 (L) 12/24/2023 0849   GLUCOSE 96 12/24/2023 0849   BUN 12 12/24/2023 0849   CREATININE 0.71 12/24/2023 0849   CALCIUM  8.9 12/24/2023 0849   GFRNONAA >60 12/24/2023 0849    IMAGING past 24 hours No results found.    Vitals:   12/23/23 2007 12/24/23 0001 12/24/23 0348 12/24/23 0853  BP: 139/89 (!) 144/100 (!) 149/99 (!) 140/97  Pulse: 90 96 91 89  Resp: 17 18 18    Temp: 98 F (36.7 C) 97.8 F (36.6 C) 99.2 F (37.3 C) 99 F (37.2 C)  TempSrc:   Oral Oral  SpO2: 97% 98% 100% 99%  Weight:         PHYSICAL EXAM General: Ill-appearing patient in no acute distress Psych: Calm and cooperative CV: Regular, tachycardic rate and rhythm on monitor Respiratory:  Regular, unlabored respirations on room air  NEURO:  Drowsy arouses easily.  Opens eyes remain open.,  He is oriented to age, city, state follows commands.  Left facial droop speech is dysarthric.  Right arm and right leg with spontaneous movement, left upper extremity can slightly move fingers, left  lower leg no movement to noxious stimuli.  Decreased sensation on left   ASSESSMENT/PLAN  Troy Bishop is a 33 y.o. male with history of hypertension, seizure and daily alcohol use admitted for acute onset left-sided weakness.  He was found to have a large right subcortical ICH.  On later CT angiogram, focus of contrast-enhancement within cerebral hematoma was found, and repeat CT demonstrated enlargement of ICH.  He received platelet transfusion for platelet count of 54.  However, follow-up CT in the morning of 5/10 revealed stable ICH.  Neurosurgery was consulted but has declined to intervene.  Cleviprex  was used for blood pressure control, and hypertonic saline was initiated to decrease edema.  In the morning of 5/10, patient became restless, agitated, diaphoretic and developed nausea.  He reports that he has 2-3 drinks of alcohol daily, and phenobarbital  taper along with Precedex  drip were initiated to treat alcohol withdrawal.  NIH on Admission 18, ICH score 1  ICH:  right BG large ICH, etiology: Likely hypertensive Code Stroke CT head 35 mm ICH in right frontotemporal region CTA head & neck focus of contrast enhancement within bed of right cerebral hematoma, no other underlying vascular abnormality Repeat CT head interval increase in size of right cerebral hemorrhage, now  estimated volume 52 mL Repeat CT head 5/10 unchanged right cerebral hematoma with 6 mm of midline shift Repeat CT head 5/16 stable MRI stable hematoma and MLS, no abnormal enhancement  2D Echo EF 60 to 65% LDL 124 HgbA1c 5.6 UDS positive for barbiturates [facility] medication) VTE prophylaxis - continue heparin  subcu No antithrombotic prior to admission,  No antithrombotic secondary to ICH Therapy recommendations: CIR Disposition: Pending  Cerebral edema Large right ICH with 6 mm midline shift noted on CT 5/10 and 5/11 Continue 3% hypertonic saline at 75->40 cc/h -> off Monitor sodium every 6 hours  Na 142->  138->132->136->138->137-137-135 MRI stable hematoma and MLS  Hypertension Home meds: None Cleviprex  drip is off As needed hydralazine  and labetalol  On amlodipine  10 mg daily Add cozaar  50->100 Add coreg  6.25--12.5--25mg  BID  BP goal less than 160 Long-term BP goal normotensive  Hyperlipidemia Home meds: None LDL 124, goal < 70 Consider statin once LFT normalizes  Alcohol withdrawal Patient states he drinks 2-3 drinks per day EtOH level on admission 264 Patient noted to be tachycardic, diaphoretic, agitated, restless with nausea and vomiting on 5/10 CCM consulted and phenobarbital  taper initiated for EtOH withdrawal Precedex  stopped, CIWA protocol Ativan  in place.  Still agitated 5/15 on restrain and ativan  PRN ETOH abuse cessation discussed this morning with patient and wife at bedside.   Thrombocytopenia Elevated LFTs Platelet 56--60--49--46-46--55-70 Status post platelet transfusion x 1 Likely related to chronic alcohol use CBC monitoring AST/ALT 146/144--114/119--106/118--122/126--130/169-160/179-232/268  Fever, likely central fever Tmax 101.2-- 101.1--100-100.9--100.6-100.4-100.7 CXR negative UA negative Monitor fever curve  Dysphagia On dysphagia 2 and thin liquid However patient not eating adequately Encourage p.o. intake Core Trak tube placed 5/16.  Receiving tube feeds  Other Active Problems Hypokalemia, K3.2--3.7--3.0->3.3 - supplement  Hospital day # 8    Patient continues to have dysarthria and left hemiplegia.  Blood pressure adequately controlled.  Recommend mobilize out of bed.  Therapy consults.  BP target blood pressure goal below 160.  Likely need inpatient rehab..  Long discussion with patient, wife and father at the bedside and answered questions. Greater than 50% time during this 35-minute visit was spent in counseling and coordination of care and discussion with patient and family and care team and answering questions.     Ardella Beaver,  MD Medical Director [facility] Pager: [phone] 12/24/2023 11:57 AM   To contact Stroke Continuity provider, please refer to [email or web address]. After hours, contact General Neurology

## 2023-12-24 NOTE — Plan of Care (Signed)
  Problem: Education: Goal: Knowledge of disease or condition will improve Outcome: Progressing   Problem: Education: Goal: Knowledge of secondary prevention will improve (MUST DOCUMENT ALL) Outcome: Progressing   Problem: Intracerebral Hemorrhage Tissue Perfusion: Goal: Complications of Intracerebral Hemorrhage will be minimized Outcome: Progressing   Problem: Safety: Goal: Ability to remain free from injury will improve Outcome: Progressing

## 2023-12-25 DIAGNOSIS — I10 Essential (primary) hypertension: Secondary | ICD-10-CM | POA: Diagnosis not present

## 2023-12-25 DIAGNOSIS — I629 Nontraumatic intracranial hemorrhage, unspecified: Secondary | ICD-10-CM

## 2023-12-25 DIAGNOSIS — I69391 Dysphagia following cerebral infarction: Secondary | ICD-10-CM | POA: Diagnosis not present

## 2023-12-25 DIAGNOSIS — G936 Cerebral edema: Secondary | ICD-10-CM | POA: Diagnosis not present

## 2023-12-25 DIAGNOSIS — I610 Nontraumatic intracerebral hemorrhage in hemisphere, subcortical: Secondary | ICD-10-CM | POA: Diagnosis not present

## 2023-12-25 LAB — COMPREHENSIVE METABOLIC PANEL WITH GFR
ALT: 372 U/L — ABNORMAL HIGH (ref 0–44)
AST: 235 U/L — ABNORMAL HIGH (ref 15–41)
Albumin: 2.9 g/dL — ABNORMAL LOW (ref 3.5–5.0)
Alkaline Phosphatase: 93 U/L (ref 38–126)
Anion gap: 10 (ref 5–15)
BUN: 16 mg/dL (ref 6–20)
CO2: 20 mmol/L — ABNORMAL LOW (ref 22–32)
Calcium: 9 mg/dL (ref 8.9–10.3)
Chloride: 106 mmol/L (ref 98–111)
Creatinine, Ser: 0.68 mg/dL (ref 0.61–1.24)
GFR, Estimated: >60 mL/min (ref >60)
Glucose, Bld: 95 mg/dL (ref 70–99)
Potassium: 3.4 mmol/L — ABNORMAL LOW (ref 3.5–5.1)
Sodium: 136 mmol/L (ref 135–145)
Total Bilirubin: 1.2 mg/dL (ref 0.0–1.2)
Total Protein: 7.5 g/dL (ref 6.5–8.1)

## 2023-12-25 LAB — CBC
HCT: 37.6 % — ABNORMAL LOW (ref 39.0–52.0)
Hemoglobin: 12.2 g/dL — ABNORMAL LOW (ref 13.0–17.0)
MCH: 30.5 pg (ref 26.0–34.0)
MCHC: 32.4 g/dL (ref 30.0–36.0)
MCV: 94 fL (ref 80.0–100.0)
Platelets: 189 10*3/uL (ref 150–400)
RBC: 4 MIL/uL — ABNORMAL LOW (ref 4.22–5.81)
RDW: 13.5 % (ref 11.5–15.5)
WBC: 6.1 10*3/uL (ref 4.0–10.5)
nRBC: 0 % (ref 0.0–0.2)

## 2023-12-25 LAB — PHOSPHORUS: Phosphorus: 4 mg/dL (ref 2.5–4.6)

## 2023-12-25 LAB — MAGNESIUM: Magnesium: 2.2 mg/dL (ref 1.7–2.4)

## 2023-12-25 MED ORDER — QUETIAPINE FUMARATE 50 MG PO TABS
50.0000 mg | ORAL_TABLET | Freq: Two times a day (BID) | ORAL | Status: DC
  Administered 2023-12-25 – 2023-12-30 (×10): 50 mg via ORAL
  Filled 2023-12-25 (×10): qty 1

## 2023-12-25 MED ORDER — ADULT MULTIVITAMIN W/MINERALS CH
1.0000 | ORAL_TABLET | Freq: Every day | ORAL | Status: DC
  Administered 2023-12-26 – 2023-12-30 (×5): 1 via ORAL
  Filled 2023-12-25 (×5): qty 1

## 2023-12-25 MED ORDER — FOLIC ACID 1 MG PO TABS
1.0000 mg | ORAL_TABLET | Freq: Every day | ORAL | Status: DC
  Administered 2023-12-26 – 2023-12-30 (×5): 1 mg via ORAL
  Filled 2023-12-25 (×5): qty 1

## 2023-12-25 MED ORDER — CARVEDILOL 12.5 MG PO TABS
25.0000 mg | ORAL_TABLET | Freq: Two times a day (BID) | ORAL | Status: DC
  Administered 2023-12-25 – 2023-12-30 (×10): 25 mg via ORAL
  Filled 2023-12-25 (×10): qty 2

## 2023-12-25 MED ORDER — THIAMINE MONONITRATE 100 MG PO TABS
100.0000 mg | ORAL_TABLET | Freq: Every day | ORAL | Status: DC
  Administered 2023-12-26 – 2023-12-30 (×5): 100 mg via ORAL
  Filled 2023-12-25 (×5): qty 1

## 2023-12-25 MED ORDER — POTASSIUM CHLORIDE CRYS ER 20 MEQ PO TBCR
40.0000 meq | EXTENDED_RELEASE_TABLET | Freq: Once | ORAL | Status: AC
  Administered 2023-12-25: 40 meq via ORAL
  Filled 2023-12-25: qty 2

## 2023-12-25 MED ORDER — AMLODIPINE BESYLATE 5 MG PO TABS
10.0000 mg | ORAL_TABLET | Freq: Every day | ORAL | Status: DC
  Administered 2023-12-26 – 2023-12-30 (×5): 10 mg via ORAL
  Filled 2023-12-25 (×5): qty 2

## 2023-12-25 MED ORDER — LOSARTAN POTASSIUM 50 MG PO TABS
100.0000 mg | ORAL_TABLET | Freq: Every day | ORAL | Status: DC
  Administered 2023-12-26 – 2023-12-30 (×5): 100 mg via ORAL
  Filled 2023-12-25 (×5): qty 2

## 2023-12-25 MED ORDER — LACTULOSE 10 GM/15ML PO SOLN
30.0000 g | Freq: Two times a day (BID) | ORAL | Status: DC
  Administered 2023-12-25 – 2023-12-30 (×10): 30 g via ORAL
  Filled 2023-12-25 (×10): qty 60

## 2023-12-25 NOTE — Progress Notes (Signed)
 Occupational Therapy Treatment Patient Details Name: Troy Bishop MRN: [id number] DOB: Feb 11, 1991 Today's Date: 12/25/2023   History of present illness Pt is a 33 y.o. male presenting 5/10 after sudden onset L weakness and collapse. CTH with large L subcortical hemorrhage, repeat CT 1 hour later showing progression of ICH from 35mL to 52mL with small volume hemorrhage within the  right lateral ventricle, consistent with intraventricular extension. PMH significant of uncontrolled HTN, cirrhosis due to alcohol abuse, thrombocytopenia   OT comments  Pt progressing toward goals, needs total +2 for bed mobility to set up at EOB, pt ranging from total - mod A at EOB for sitting balance, utilized mirror for pt to come to midline, comes to midline briefly (2 seconds) before returning to L lateral lean, pushing with R, and also at times with anterior lean. Pt able to perform UB bathing task washing LUE with RUE, LUE elevated on pillows at end of session. Pt presenting with impairments listed below, will follow acutely. Patient will benefit from intensive inpatient follow-up therapy, >3 hours/day to maximize safety/ind with ADL/functional mobility.       If plan is discharge home, recommend the following:  Two people to help with walking and/or transfers;Two people to help with bathing/dressing/bathroom;Assist for transportation;Help with stairs or ramp for entrance;Direct supervision/assist for medications management;Direct supervision/assist for financial management;Assistance with feeding;Assistance with cooking/housework   Equipment Recommendations  Other (comment) (defer)    Recommendations for Other Services Rehab consult    Precautions / Restrictions Precautions Precautions: Fall;Other (comment) Recall of Precautions/Restrictions: Impaired Precaution/Restrictions Comments: L hemi, neglect Restrictions Weight Bearing Restrictions Per Provider Order: No       Mobility Bed Mobility Overal bed  mobility: Needs Assistance Bed Mobility: Supine to Sit     Supine to sit: Total assist, +2 for physical assistance Sit to supine: +2 for physical assistance, Total assist        Transfers                   General transfer comment: deferred     Balance Overall balance assessment: Needs assistance Sitting-balance support: Feet supported Sitting balance-Leahy Scale: Poor Sitting balance - Comments: L lateral and anterior lean                                   ADL either performed or assessed with clinical judgement   ADL Overall ADL's : Needs assistance/impaired Eating/Feeding: NPO       Upper Body Bathing: Set up Upper Body Bathing Details (indicate cue type and reason): washes L arm with RUE                         Functional mobility during ADLs: +2 for physical assistance;Total assistance      Extremity/Trunk Assessment Upper Extremity Assessment Upper Extremity Assessment: LUE deficits/detail LUE Deficits / Details: no active movement.   Lower Extremity Assessment Lower Extremity Assessment: Defer to PT evaluation        Vision   Vision Assessment?: Vision impaired- to be further tested in functional context Additional Comments: will futher assess, noted to have redness in L eye, at times reports diplopia but states it is intermittent,  suspect L field deficit   Perception Perception Perception: Impaired Preception Impairment Details: Inattention/Neglect;Spatial orientation Perception-Other Comments: L neglect, impaired trunk control leans forward and to L   Praxis Praxis Praxis: Impaired Praxis Impairment Details:  (  pushes L)   Communication Communication Communication: Impaired Factors Affecting Communication: Reduced clarity of speech   Cognition Arousal: Alert Behavior During Therapy: Flat affect, Restless Cognition: Cognition impaired, Difficult to assess Difficult to assess due to: Impaired  communication Orientation impairments: Time, Situation, Place Awareness: Intellectual awareness impaired, Online awareness impaired Memory impairment (select all impairments): Short-term memory, Working memory Attention impairment (select first level of impairment): Sustained attention   OT - Cognition Comments: talking in different language on the phone at end of session, following commands with incr time, delayed responses/slowed processing                 Following commands: Impaired Following commands impaired: Follows one step commands inconsistently      Cueing   Cueing Techniques: Verbal cues, Gestural cues, Tactile cues, Visual cues  Exercises      Shoulder Instructions       General Comments BP <160 throughout    Pertinent Vitals/ Pain       Pain Assessment Pain Assessment: No/denies pain Pain Location: L hip (?) pt frequently stating "here" Pain Descriptors / Indicators: Discomfort Pain Intervention(s): Limited activity within patient's tolerance, Monitored during session, Repositioned  Home Living                                          Prior Functioning/Environment              Frequency  Min 2X/week        Progress Toward Goals  OT Goals(current goals can now be found in the care plan section)  Progress towards OT goals: Progressing toward goals  Acute Rehab OT Goals OT Goal Formulation: With patient Time For Goal Achievement: 01/01/24 Potential to Achieve Goals: Good ADL Goals Pt Will Perform Grooming: with min assist;sitting Pt Will Perform Upper Body Bathing: with min assist;sitting Pt Will Perform Lower Body Bathing: with mod assist;sit to/from stand Pt Will Perform Lower Body Dressing: sit to/from stand;with max assist Pt Will Transfer to Toilet: with mod assist;stand pivot transfer Additional ADL Goal #1: Pt will locate 3/5 items on tray table with min cues to scan L Additional ADL Goal #2: pt will perform bed  mobility with min A as a precursor to ADL  Plan      Co-evaluation    PT/OT/SLP Co-Evaluation/Treatment: Yes Reason for Co-Treatment: Complexity of the patient's impairments (multi-system involvement);For patient/therapist safety;To address functional/ADL transfers;Necessary to address cognition/behavior during functional activity PT goals addressed during session: Mobility/safety with mobility OT goals addressed during session: ADL's and self-care      AM-PAC OT "6 Clicks" Daily Activity     Outcome Measure   Help from another person eating meals?: Total Help from another person taking care of personal grooming?: A Lot Help from another person toileting, which includes using toliet, bedpan, or urinal?: A Lot Help from another person bathing (including washing, rinsing, drying)?: A Lot Help from another person to put on and taking off regular upper body clothing?: A Lot Help from another person to put on and taking off regular lower body clothing?: A Lot 6 Click Score: 11    End of Session    OT Visit Diagnosis: Unsteadiness on feet (R26.81);Muscle weakness (generalized) (M62.81);Low vision, both eyes (H54.2);Other symptoms and signs involving cognitive function;Cognitive communication deficit (R41.841);Hemiplegia and hemiparesis Symptoms and signs involving cognitive functions: Nontraumatic intracerebral hemorrhage Hemiplegia - Right/Left: Left Hemiplegia -  caused by: Nontraumatic intracerebral hemorrhage   Activity Tolerance Patient tolerated treatment well   Patient Left in bed;with call bell/phone within reach;with bed alarm set;with family/visitor present   Nurse Communication Mobility status        Time: 1610-9604 OT Time Calculation (min): 28 min  Charges: OT General Charges $OT Visit: 1 Visit OT Treatments $Self Care/Home Management : 8-22 mins  Edilia Ghuman K, OTD, OTR/L SecureChat Preferred Acute Rehab [phone]   Benedict Brain Koonce 12/25/2023, 11:27  AM

## 2023-12-25 NOTE — Progress Notes (Signed)
 Speech Language Pathology Treatment: Cognitive-Linquistic;Dysphagia  Patient Details Name: Troy Bishop MRN: [id number] DOB: 12/13/1990 Today's Date: 12/25/2023 Time: 4098-1191 SLP Time Calculation (min) (ACUTE ONLY): 27 min  Assessment / Plan / Recommendation Clinical Impression  Pt exhibits tolerance of PO, but very poor awareness of deficits and poor compensations. Observed pt laying on weak side, barely upright, with father feeding him water from a straw. Straw on left side with significant anterior spillage. Repositioned pt, demonstrated the leaning to the right is a better position to keep liquid in his mouth. Placed straw on right. Pt tolerated this well. Pt able to masticate regular solids despite lingual weakness on left. Has been eating food from home well. Will advance diet to dys 3 (mech soft) with thin liquids.   Pt is still densely hemiparetic on the left. 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 SLP guided him through some activities and reasoning questions to improve awareness of deficits and set more attainable goals. Pt able to recognize with visual feedback that his left hand is not functional. Able to verbalize that he couldn't use a sewing machine for a while, perhaps more than months, but also states he must work. Also modeled slow loud over articulated speech to improve intelligibility. Pt much clearer when speaking to his family than in english, though he is fluent. Pt able to return demonstrate with a structures repetition task. Could not carry over to simple conversation yet. Will f/u for further interventions while admitted.     HPI HPI: Patient is a 33 y.o. male with PMH: ETOH abuse, HTN (not taking medications), single previous seizure. He presented to the hospital on 12/16/23 with left sided weakness. CT showed a large subcortical hemorrhage on the left. MRI brain showed a massive intracranial hemorrhage centered in right basal  ganglia and extending into the anterior right temporal lobe.      SLP Plan  Continue with current plan of care      Recommendations for follow up therapy are one component of a multi-disciplinary discharge planning process, led by the attending physician.  Recommendations may be updated based on patient status, additional functional criteria and insurance authorization.    Recommendations  Diet recommendations: Dysphagia 3 (mechanical soft);Thin liquid Liquids provided via: Cup;Straw Medication Administration: Whole meds with puree Supervision: Trained caregiver to feed patient Compensations: Slow rate;Small sips/bites;Minimize environmental distractions;Lingual sweep for clearance of pocketing;Monitor for anterior loss Postural Changes and/or Swallow Maneuvers: Seated upright 90 degrees                              Continue with current plan of care     Jennefer Kopp, Hardin Leys  12/25/2023, 2:53 PM

## 2023-12-25 NOTE — Progress Notes (Signed)
 STROKE TEAM PROGRESS NOTE   INTERIM HISTORY/SUBJECTIVE His father and wife are at the bedside.  Patient is sitting up in bed in no distress  .  Neurological exam is unchanged with dense left hemiplegia but able to move left leg off the bed   Vitals are stable.  Blood pressure adequately controlled CBC    Component Value Date/Time   WBC 6.1 12/25/2023 0625   RBC 4.00 (L) 12/25/2023 0625   HGB 12.2 (L) 12/25/2023 0625   HCT 37.6 (L) 12/25/2023 0625   PLT 189 12/25/2023 0625   MCV 94.0 12/25/2023 0625   MCH 30.5 12/25/2023 0625   MCHC 32.4 12/25/2023 0625   RDW 13.5 12/25/2023 0625   LYMPHSABS 0.9 12/16/2023 0024   MONOABS 0.6 12/16/2023 0024   EOSABS 0.1 12/16/2023 0024   BASOSABS 0.0 12/16/2023 0024    BMET    Component Value Date/Time   NA 136 12/25/2023 0625   K 3.4 (L) 12/25/2023 0625   CL 106 12/25/2023 0625   CO2 20 (L) 12/25/2023 0625   GLUCOSE 95 12/25/2023 0625   BUN 16 12/25/2023 0625   CREATININE 0.68 12/25/2023 0625   CALCIUM  9.0 12/25/2023 0625   GFRNONAA >60 12/25/2023 0625    IMAGING past 24 hours No results found.    Vitals:   12/24/23 2350 12/25/23 0408 12/25/23 0754 12/25/23 1203  BP: 136/68 129/79 (!) 140/99 131/89  Pulse: 90 78 80 83  Resp: 18 16 18 19   Temp: 98.5 F (36.9 C) (!) 100.7 F (38.2 C) 98.7 F (37.1 C) 98.7 F (37.1 C)  TempSrc:    Oral  SpO2: 98% 94% 98% 99%  Weight:         PHYSICAL EXAM General: Ill-appearing patient in no acute distress Psych: Calm and cooperative CV: Regular, tachycardic rate and rhythm on monitor Respiratory:  Regular, unlabored respirations on room air  NEURO:  Drowsy arouses easily.  Opens eyes remain open.,  He is oriented to age, city, state follows commands.  Left facial droop speech is dysarthric.  Right arm and right leg with spontaneous movement, left upper extremity can slightly move fingers, left lower leg no movement to noxious stimuli.  Decreased sensation on  left   ASSESSMENT/PLAN  [name] is a 33 y.o. male with history of hypertension, seizure and daily alcohol use admitted for acute onset left-sided weakness.  He was found to have a large right subcortical ICH.  On later CT angiogram, focus of contrast-enhancement within cerebral hematoma was found, and repeat CT demonstrated enlargement of ICH.  He received platelet transfusion for platelet count of 54.  However, follow-up CT in the morning of 5/10 revealed stable ICH.  Neurosurgery was consulted but has declined to intervene.  Cleviprex  was used for blood pressure control, and hypertonic saline was initiated to decrease edema.  In the morning of 5/10, patient became restless, agitated, diaphoretic and developed nausea.  He reports that he has 2-3 drinks of alcohol daily, and phenobarbital  taper along with Precedex  drip were initiated to treat alcohol withdrawal.  NIH on Admission 18, ICH score 1  ICH:  right BG large ICH, etiology: Likely hypertensive Code Stroke CT head 35 mm ICH in right frontotemporal region CTA head & neck focus of contrast enhancement within bed of right cerebral hematoma, no other underlying vascular abnormality Repeat CT head interval increase in size of right cerebral hemorrhage, now estimated volume 52 mL Repeat CT head 5/10 unchanged right cerebral hematoma with 6 mm of  midline shift Repeat CT head 5/16 stable MRI stable hematoma and MLS, no abnormal enhancement  2D Echo EF 60 to 65% LDL 124 HgbA1c 5.6 UDS positive for barbiturates [facility] medication) VTE prophylaxis - continue heparin  subcu No antithrombotic prior to admission,  No antithrombotic secondary to ICH Therapy recommendations: CIR Disposition: Pending  Cerebral edema Large right ICH with 6 mm midline shift noted on CT 5/10 and 5/11 Continue 3% hypertonic saline at 75->40 cc/h -> off Monitor sodium every 6 hours  Na 142-> 138->132->136->138->137-137-135 MRI stable hematoma and  MLS  Hypertension Home meds: None Cleviprex  drip is off As needed hydralazine  and labetalol  On amlodipine  10 mg daily Add cozaar  50->100 Add coreg  6.25--12.5--25mg  BID  BP goal less than 160 Long-term BP goal normotensive  Hyperlipidemia Home meds: None LDL 124, goal < 70 Consider statin once LFT normalizes  Alcohol withdrawal Patient states he drinks 2-3 drinks per day EtOH level on admission 264 Patient noted to be tachycardic, diaphoretic, agitated, restless with nausea and vomiting on 5/10 CCM consulted and phenobarbital  taper initiated for EtOH withdrawal Precedex  stopped, CIWA protocol Ativan  in place.  Still agitated 5/15 on restrain and ativan  PRN ETOH abuse cessation discussed this morning with patient and wife at bedside.   Thrombocytopenia Elevated LFTs Platelet 56--60--49--46-46--55-70 Status post platelet transfusion x 1 Likely related to chronic alcohol use CBC monitoring AST/ALT 146/144--114/119--106/118--122/126--130/169-160/179-232/268  Fever, likely central fever Tmax 101.2-- 101.1--100-100.9--100.6-100.4-100.7 CXR negative UA negative Monitor fever curve  Dysphagia On dysphagia 2 and thin liquid However patient not eating adequately Encourage p.o. intake Core Trak tube placed 5/16.  Receiving tube feeds  Other Active Problems Hypokalemia, K3.2--3.7--3.0->3.3 - supplement  Hospital day # 9   Patient continues to have dysarthria and left hemiplegia.  Blood pressure adequately controlled.  Recommend mobilize out of bed.  Therapy consults.  BP target blood pressure goal below 160.  Likely need inpatient rehab..  Long discussion with patient, wife and father at the bedside and answered questions.  Stroke team will sign off.  Kindly call for questions.  Follow-up as outpatient with stroke clinic in 2 months.  Discussed with Dr. Lurlene Salon Greater than 50% time during this 25-minute visit was spent in counseling and coordination of care and discussion with  patient and family and care team and answering questions.     Ardella Beaver, MD Medical Director [facility] Pager: [phone] 12/25/2023 2:22 PM   To contact Stroke Continuity provider, please refer to [email or web address]. After hours, contact General Neurology

## 2023-12-25 NOTE — Progress Notes (Addendum)
 Inpatient Rehab Admissions Coordinator:   CIR following, note that he remains total assist and not at a level to tolerate the intensity of CIR. CIR will follow for progress and participation and send case to insurance if he appears able to tolerate; however, If pt. Is medically stable for d/c, he will need SNF.   Wandalee Gust, MS, CCC-SLP Rehab Admissions Coordinator  [phone] (celll) [phone] (office)

## 2023-12-25 NOTE — Progress Notes (Signed)
 Physical Therapy Treatment Patient Details Name: Troy Bishop MRN: [id number] DOB: 1990-10-31 Today's Date: 12/25/2023   History of Present Illness Pt is a 33 y.o. male presenting 5/10 after sudden onset L weakness and collapse. CTH with large L subcortical hemorrhage, repeat CT 1 hour later showing progression of ICH from 35mL to 52mL with small volume hemorrhage within the  right lateral ventricle, consistent with intraventricular extension. PMH significant of uncontrolled HTN, cirrhosis due to alcohol abuse, thrombocytopenia    PT Comments  Pt with fair tolerance to treatment today. Co-treat with OT. Pt today required +2 total A to sit EOB and was noted with heavy L lateral and anterior lean. Pt required Mod-Max A for sitting balance throughout and utilized mirror for pt to come to midline. No change in DC/DME recs at this time. PT will continue to follow.     If plan is discharge home, recommend the following: Two people to help with walking and/or transfers;Two people to help with bathing/dressing/bathroom   Can travel by private vehicle        Equipment Recommendations  Other (comment) (Per accepting facility)    Recommendations for Other Services       Precautions / Restrictions Precautions Precautions: Fall;Other (comment) Recall of Precautions/Restrictions: Impaired Precaution/Restrictions Comments: L hemi, neglect Restrictions Weight Bearing Restrictions Per Provider Order: No     Mobility  Bed Mobility Overal bed mobility: Needs Assistance Bed Mobility: Supine to Sit     Supine to sit: Total assist, +2 for physical assistance Sit to supine: +2 for physical assistance, Total assist   General bed mobility comments: Assist for L sided management, cues for initiation. Heavy L Lateral lean and anterior lean when seated EOB.    Transfers                   General transfer comment: deferred    Ambulation/Gait               General Gait Details:  unable   Stairs             Wheelchair Mobility     Tilt Bed    Modified Rankin (Stroke Patients Only)       Balance Overall balance assessment: Needs assistance Sitting-balance support: Feet supported Sitting balance-Leahy Scale: Poor Sitting balance - Comments: L lateral and anterior lean                                    Communication Communication Communication: Impaired Factors Affecting Communication: Reduced clarity of speech  Cognition Arousal: Alert Behavior During Therapy: Flat affect, Restless                           PT - Cognition Comments: Difficult to understand speech, follows commands intermittently. Following commands: Impaired Following commands impaired: Follows one step commands inconsistently    Cueing Cueing Techniques: Verbal cues, Gestural cues, Tactile cues, Visual cues  Exercises      General Comments General comments (skin integrity, edema, etc.): BP <160 throughout      Pertinent Vitals/Pain Pain Assessment Pain Assessment: Faces Faces Pain Scale: Hurts little more Pain Location: L hip (?) pt frequently stating "here" Pain Descriptors / Indicators: Discomfort, Moaning, Grimacing Pain Intervention(s): Limited activity within patient's tolerance, Monitored during session, Repositioned    Home Living  Prior Function            PT Goals (current goals can now be found in the care plan section) Progress towards PT goals: Progressing toward goals    Frequency    Min 3X/week      PT Plan      Co-evaluation PT/OT/SLP Co-Evaluation/Treatment: Yes Reason for Co-Treatment: Complexity of the patient's impairments (multi-system involvement);For patient/therapist safety;To address functional/ADL transfers;Necessary to address cognition/behavior during functional activity PT goals addressed during session: Mobility/safety with mobility OT goals addressed during  session: ADL's and self-care      AM-PAC PT "6 Clicks" Mobility   Outcome Measure  Help needed turning from your back to your side while in a flat bed without using bedrails?: A Lot Help needed moving from lying on your back to sitting on the side of a flat bed without using bedrails?: Total Help needed moving to and from a bed to a chair (including a wheelchair)?: Total Help needed standing up from a chair using your arms (e.g., wheelchair or bedside chair)?: Total Help needed to walk in hospital room?: Total Help needed climbing 3-5 steps with a railing? : Total 6 Click Score: 7    End of Session   Activity Tolerance: Patient tolerated treatment well Patient left: in bed;with call bell/phone within reach;with bed alarm set;with family/visitor present Nurse Communication: Mobility status;Need for lift equipment PT Visit Diagnosis: Hemiplegia and hemiparesis;Unsteadiness on feet (R26.81) Hemiplegia - Right/Left: Left Hemiplegia - dominant/non-dominant: Non-dominant Hemiplegia - caused by: Nontraumatic intracerebral hemorrhage     Time: 4132-4401 PT Time Calculation (min) (ACUTE ONLY): 23 min  Charges:    $Therapeutic Activity: 8-22 mins PT General Charges $$ ACUTE PT VISIT: 1 Visit                     Troy Bishop, PT, DPT Acute Rehab Services [phone]    Malee Grays 12/25/2023, 11:49 AM

## 2023-12-25 NOTE — Progress Notes (Signed)
 PROGRESS NOTE Troy Bishop  XBJ:[id number] DOB: [birth year] DOA: 12/16/2023 PCP: Pcp, No  Brief Narrative/Hospital Course: 33 yo male with past medical history of hypertension, seizure and daily alcohol use admitted for acute onset left-sided weakness. He was found to have a large right subcortical ICH. admitted on 5/10. Repeat Ct head with enlargement of ICH and he received PLT transfusion for Plt 54 . was on clevipres has been off for multiple days and started on Po Meds. On CIWA protocol for withdrawal. core track tube placed with TF infusing. Elevated liver enzymes, fevers which are likely central. Therapy recommending CIR   Subjective: Seen examined Restin well no new complaints  Weakness moe on LUE, able to move LLE some Calm, at times mumbles words per famil. Mostly coherent. Overnight low-grade temp 100.7 BP stable on room air Labs reviewed mild hypokalemia 3.4 CBC stable , lfts AST/ALT/tb:  232/268/1.6> 321/335/1.6> today  235/372/1.2 at  Assessment and plan:  Right basal ganglia large ICH Acute metabolic encephalopathy: Likely hypertensive ICH. Patient admitted under code stroke complaints CT heads CTA head and neck as below Code Stroke CT head 35 mm ICH in right frontotemporal region CTA head & neck focus of contrast enhancement within bed of right cerebral hematoma, no other underlying vascular abnormality Repeat CT head interval increase in size of right cerebral hemorrhage, now estimated volume 52 mL Repeat CT head 5/10 unchanged right cerebral hematoma with 6 mm of midline shift Repeat CT head 5/16 stable MRI stable hematoma and MLS, no abnormal enhancement  Stroke workup completed with echo normal EF, LDL 124 A1c 5.6, uds UDS + for barbiturates [facility] medication) No antithrombotic prior to admission,  No antithrombotic secondary to ICH Continue PT OT with plan for CIR  Continue Seroquel  50 twice daily, as needed sedatives  Cerebral edema W/ Large right ICH with 6 mm  midline shift noted on CT 5/10 and 5/11: S/P 3% hypertonic saline In ICU.  Continue to monitor sodium MRI stable hematoma and embolus   Hypertension PTA not on meds, In ICU needed Cleviprex  drip. BP well-controlled on amlodipine  10 Coreg  25 twice daily, losartan  100 SBP goal less than 160 AND Long-term BP goal normotensive   Hyperlipidemia: LDL 124, goal < 70.  Not on statin start once LFT improves also Treated  Hypokalemia: replaced  Alcohol withdrawal W/ alcohol abuse drinking 2-3/d EtOH level on admission 264. Initially on Precedex  on CIWA protocol Ativan .  At this time overall stable continue Ativan  as needed, Seroquel  ETOH abuse cessation has been discussed    Thrombocytopenia: Likely from underlying liver dysfunction/alcohol abuse, received platelet transfusion x 1.  Improved  Elevated LFTs: Transaminitis likely from alcohol abuse remain elevated continue to trend up Likely in the setting of alcohol abuse, monitor LFTs and labs-labs trending up. Recent Labs  Lab 12/19/23 0537 12/20/23 0558 12/20/23 1117 12/21/23 0930 12/22/23 0631 12/23/23 0732 12/24/23 0849 12/25/23 0625  AST 106* 122*  --  130* 160* 232* 321* 235*  ALT 118* 126*  --  169* 179* 268* 355* 372*  ALKPHOS 71 60  --  76 65 75 82 93  BILITOT 2.2* 2.0*  --  1.6* 1.6* 1.6* 1.6* 1.2  PROT 8.1 7.4  --  7.5 7.2 7.5 7.5 7.5  ALBUMIN 3.3* 3.1*  --  3.2* 2.9* 3.0* 3.0* 2.9*  AMMONIA  --   --  47*  --   --   --   --   --   PLT 46* 46*  --  55* 70*  --   --  189   Fever, likely central fever: CXR negative UA negative,monitor.  Low-grade temperature monitor closely remains with normal WBC count   Dysphagia:  Cont DYS diet per SLP-was needing core track tube feeding in ICU 5/16.  DVT prophylaxis: heparin  injection 5,000 Units Start: 12/18/23 2215 SCD's Start: 12/16/23 0115 Code Status:   Code Status: Full Code Family Communication: plan of care discussed with patient/family at bedside. Patient status is:  Remains hospitalized because of severity of illness Level of care: Telemetry Medical   Dispo: The patient is from: home            Anticipated disposition: CIR once available.IF CIR unable to admit pursue SNF  Objective: Vitals last 24 hrs: Vitals:   12/24/23 2350 12/25/23 0408 12/25/23 0754 12/25/23 1203  BP: 136/68 129/79 (!) 140/99 131/89  Pulse: 90 78 80 83  Resp: 18 16 18 19   Temp: 98.5 F (36.9 C) (!) 100.7 F (38.2 C) 98.7 F (37.1 C) 98.7 F (37.1 C)  TempSrc:    Oral  SpO2: 98% 94% 98% 99%  Weight:        Physical Examination: General exam: aaox3, calm HEENT:Oral mucosa moist, Ear/Nose WNL grossly Respiratory system: Bilaterally clear BS, no use of accessory muscle Cardiovascular system: S1 & S2 +. Gastrointestinal system: Abdomen soft,  NT,ND,BS+ Nervous System: Alert, awake, oriented , left upper extremity flaccid weakness for movement in the left lower extremity  Extremities: LE edema neg, warm extremities Skin: No rashes,warm. MSK: Normal muscle bulk/tone.   Data Reviewed: I have personally reviewed following labs and imaging studies ( see epic result tab) CBC: Recent Labs  Lab 12/19/23 0537 12/20/23 0558 12/21/23 0930 12/22/23 0631 12/25/23 0625  WBC 5.7 4.8 5.8 5.9 6.1  HGB 16.1 13.8 14.3 13.2 12.2*  HCT 46.6 40.6 41.9 40.3 37.6*  MCV 92.1 92.9 92.5 93.1 94.0  PLT 46* 46* 55* 70* 189   CMP: Recent Labs  Lab 12/21/23 0930 12/21/23 1202 12/22/23 0631 12/22/23 1150 12/22/23 1755 12/22/23 2353 12/23/23 0732 12/24/23 0849 12/25/23 0625  NA 139   < > 137   < > 136 136 135 135 136  K 3.3*  --  3.6  --   --   --  3.3* 3.8 3.4*  CL 109  --  109  --   --   --  104 105 106  CO2 18*  --  19*  --   --   --  20* 19* 20*  GLUCOSE 132*  --  95  --   --   --  96 96 95  BUN 9  --  10  --   --   --  15 12 16   CREATININE 0.70  --  0.67  --   --   --  0.61 0.71 0.68  CALCIUM  8.8*  --  8.6*  --   --   --  8.8* 8.9 9.0  MG 2.1  --  2.1  --   --   --  2.2  2.1 2.2  PHOS 3.7  --  4.3  --   --   --  3.6 3.4 4.0   < > = values in this interval not displayed.   GFR: CrCl cannot be calculated (Unknown ideal weight.). Recent Labs  Lab 12/21/23 0930 12/22/23 0631 12/23/23 0732 12/24/23 0849 12/25/23 0625  AST 130* 160* 232* 321* 235*  ALT 169* 179* 268* 355* 372*  ALKPHOS 76  65 75 82 93  BILITOT 1.6* 1.6* 1.6* 1.6* 1.2  PROT 7.5 7.2 7.5 7.5 7.5  ALBUMIN 3.2* 2.9* 3.0* 3.0* 2.9*   No results for input(s): "LIPASE", "AMYLASE" in the last 168 hours.  Recent Labs  Lab 12/20/23 1117  AMMONIA 47*   Coagulation Profile:  No results for input(s): "INR", "PROTIME" in the last 168 hours.  Unresulted Labs (From admission, onward)     Start     Ordered   12/25/23 0500  Comprehensive metabolic panel with GFR  Daily,   R     Question:  Specimen collection method  Answer:  Lab=Lab collect   12/24/23 0900   12/25/23 0500  CBC  Daily,   R     Question:  Specimen collection method  Answer:  Lab=Lab collect   12/24/23 0900   12/23/23 0500  Magnesium   (ICU Tube Feeding: PEPuP )  Daily,   R     Question:  Specimen collection method  Answer:  Lab=Lab collect   12/22/23 1232   12/23/23 0500  Phosphorus  (ICU Tube Feeding: PEPuP )  Daily,   R     Question:  Specimen collection method  Answer:  Lab=Lab collect   12/22/23 1232           Antimicrobials/Microbiology: Anti-infectives (From admission, onward)    None         Component Value Date/Time   SDES URINE, CATHETERIZED 07/31/2012 1953   SPECREQUEST NONE 07/31/2012 1953   CULT NO GROWTH 07/31/2012 1953   REPTSTATUS 08/02/2012 FINAL 07/31/2012 1953    Procedures:  Medications reviewed:  Scheduled Meds:  [START ON 12/26/2023] amLODipine   10 mg Oral Daily   carvedilol   25 mg Oral BID WC   Chlorhexidine  Gluconate Cloth  6 each Topical Daily   [START ON 12/26/2023] folic acid   1 mg Oral Daily   heparin  injection (subcutaneous)  5,000 Units Subcutaneous Q8H   lactulose   30 g Oral BID    [START ON 12/26/2023] losartan   100 mg Oral Daily   [START ON 12/26/2023] multivitamin with minerals  1 tablet Oral Daily   nicotine   21 mg Transdermal Daily   ondansetron  (ZOFRAN ) IV  4 mg Intravenous Once   mouth rinse  15 mL Mouth Rinse 4 times per day   pantoprazole  (PROTONIX ) IV  40 mg Intravenous QHS   QUEtiapine   50 mg Oral BID   sodium chloride  flush  3 mL Intravenous Once   [START ON 12/26/2023] thiamine   100 mg Oral Daily   Continuous Infusions:  Lesa Rape, MD [facility] 12/25/2023, 2:11 PM

## 2023-12-26 ENCOUNTER — Inpatient Hospital Stay (HOSPITAL_COMMUNITY)

## 2023-12-26 DIAGNOSIS — I629 Nontraumatic intracranial hemorrhage, unspecified: Secondary | ICD-10-CM | POA: Diagnosis not present

## 2023-12-26 DIAGNOSIS — M79602 Pain in left arm: Secondary | ICD-10-CM

## 2023-12-26 LAB — COMPREHENSIVE METABOLIC PANEL WITH GFR
ALT: 341 U/L — ABNORMAL HIGH (ref 0–44)
AST: 185 U/L — ABNORMAL HIGH (ref 15–41)
Albumin: 3 g/dL — ABNORMAL LOW (ref 3.5–5.0)
Alkaline Phosphatase: 92 U/L (ref 38–126)
Anion gap: 9 (ref 5–15)
BUN: 16 mg/dL (ref 6–20)
CO2: 21 mmol/L — ABNORMAL LOW (ref 22–32)
Calcium: 9 mg/dL (ref 8.9–10.3)
Chloride: 110 mmol/L (ref 98–111)
Creatinine, Ser: 0.7 mg/dL (ref 0.61–1.24)
GFR, Estimated: >60 mL/min (ref >60)
Glucose, Bld: 98 mg/dL (ref 70–99)
Potassium: 3.9 mmol/L (ref 3.5–5.1)
Sodium: 140 mmol/L (ref 135–145)
Total Bilirubin: 0.9 mg/dL (ref 0.0–1.2)
Total Protein: 7.4 g/dL (ref 6.5–8.1)

## 2023-12-26 LAB — CBC
HCT: 38.2 % — ABNORMAL LOW (ref 39.0–52.0)
Hemoglobin: 12.2 g/dL — ABNORMAL LOW (ref 13.0–17.0)
MCH: 30.4 pg (ref 26.0–34.0)
MCHC: 31.9 g/dL (ref 30.0–36.0)
MCV: 95.3 fL (ref 80.0–100.0)
Platelets: 212 10*3/uL (ref 150–400)
RBC: 4.01 MIL/uL — ABNORMAL LOW (ref 4.22–5.81)
RDW: 13.8 % (ref 11.5–15.5)
WBC: 5.9 10*3/uL (ref 4.0–10.5)
nRBC: 0 % (ref 0.0–0.2)

## 2023-12-26 LAB — MAGNESIUM: Magnesium: 2.3 mg/dL (ref 1.7–2.4)

## 2023-12-26 LAB — PHOSPHORUS: Phosphorus: 4 mg/dL (ref 2.5–4.6)

## 2023-12-26 MED ORDER — ENSURE ENLIVE PO LIQD
237.0000 mL | Freq: Two times a day (BID) | ORAL | Status: DC
  Administered 2023-12-26 – 2023-12-30 (×10): 237 mL via ORAL

## 2023-12-26 MED ORDER — POLYETHYLENE GLYCOL 3350 17 G PO PACK
17.0000 g | PACK | Freq: Every day | ORAL | Status: DC
  Administered 2023-12-26 – 2023-12-30 (×5): 17 g via ORAL
  Filled 2023-12-26 (×5): qty 1

## 2023-12-26 NOTE — Progress Notes (Signed)
 Nutrition Follow-up  DOCUMENTATION CODES:   Non-severe (moderate) malnutrition in context of chronic illness  INTERVENTION:  Continue thiamine , folic acid , Multivitamin w/ minerals daily Ensure Enlive po BID, each supplement provides 350 kcal and 20 grams of protein. Encourage good PO intake   NUTRITION DIAGNOSIS:  Moderate Malnutrition related to chronic illness (cirrhosis) as evidenced by moderate muscle depletion, moderate fat depletion. - Ongoing  GOAL:  Patient will meet greater than or equal to 90% of their needs - Ongoing   MONITOR:  PO intake, Supplement acceptance, I & O's, Labs  REASON FOR ASSESSMENT:  Consult Enteral/tube feeding initiation and management  ASSESSMENT:  Pt with PMH of ETOH use and liver dz (likely underlying cirrhosis) admitted for nontraumatic R ICH with brain compression.    5/10 - concerns for ETOH withdrawal; waxing and waning agitation  5/16 - s/p cortrak placement (tip gastric); Cortrak pulled by pt 5/17 - transferred to floor 5/19 - diet advanced to dysphagia 3  Pt laying in bed, family at bedside. Reports that he has been eating well. Pt asking when he can have regular/solid foods. RD explained that will be determined by SLP when safe to do so. Denies any nausea or vomiting. Has not had any Ensure's yet, encouraged use to help with PO intake.    Admission Weight: 63.4 kg Current Weight: 61.5 kg (5/17)  Nutrition Related Medications: Folic acid , Lactulose , MVI, Protonix , Thiamine   Labs: Sodium 140, Potassium 3.9, BUN 16, Creatinine 0.70, Phosphorus 4.0, Magnesium  2.3   UOP: 600 mL x 24 hrs   Diet Order:   Diet Order             DIET DYS 3 Room service appropriate? Yes with Assist; Fluid consistency: Thin  Diet effective now                  EDUCATION NEEDS:  No education needs have been identified at this time  Skin:  Skin Assessment: Reviewed RN Assessment  Last BM:  5/18  Height:  Ht Readings from Last 1 Encounters:   06/13/23 5\' 6"  (1.676 m)   Weight:  Wt Readings from Last 1 Encounters:  12/23/23 61.5 kg   BMI:  Body mass index is 21.88 kg/m.  Estimated Nutritional Needs:  Kcal:  2100-2300 Protein:  100-120 grams Fluid:  > 2 L/day   Doneta Furbish RD, LDN Clinical Dietitian

## 2023-12-26 NOTE — Plan of Care (Signed)
 Family at bedside feeding food from home. Meds as ordered.Request PRN Miralax . Passing gas.    Problem: Education: Goal: Knowledge of disease or condition will improve Outcome: Progressing   Problem: Nutrition: Goal: Risk of aspiration will decrease Outcome: Progressing   Problem: Activity: Goal: Risk for activity intolerance will decrease Outcome: Progressing   Problem: Safety: Goal: Ability to remain free from injury will improve Outcome: Progressing   Problem: Skin Integrity: Goal: Risk for impaired skin integrity will decrease Outcome: Progressing

## 2023-12-26 NOTE — Progress Notes (Signed)
   I met at bedside with patient, friend feeding him, and spoke with wife by phone. Wife prefers CIR and does not want SNF. I will begin insurance Auth with [employer]. Admit pending approval.  Jeannetta Millman, RN, MSN Rehab Admissions Coordinator [phone] 12/26/2023 12:12 PM

## 2023-12-26 NOTE — Progress Notes (Signed)
 PROGRESS NOTE Troy Bishop  HYQ:[id number] DOB: July 17, 1991 DOA: 12/16/2023 PCP: Pcp, No  Brief Narrative/Hospital Course: 33 yo male with past medical history of hypertension, seizure and daily alcohol use admitted for acute onset left-sided weakness. He was found to have a large right subcortical ICH. admitted on 5/10. Repeat Ct head with enlargement of ICH and he received PLT transfusion for Plt 54 . was on clevipres has been off for multiple days and started on Po Meds. On CIWA protocol for withdrawal. core track tube placed with TF infusing. Elevated liver enzymes, fevers which are likely central. Therapy recommending CIR   Subjective: Patient seen examined He complaints of left arm redness Overall vital stable afebrile today although intermittent low-grade temperature  Labs reviewed mild hypokalemia 3.4 CBC stable , lfts AST/ALT/tb:  232/268/1.6> 321/335/1.6> 235/372/1.2 > 185/341/0.9 improving  No other new complaints  Assessment and plan:  Right basal ganglia large ICH Acute metabolic encephalopathy: Likely hypertensive ICH. Patient admitted under code stroke complaints CT heads CTA head and neck as below Code Stroke CT head 35 mm ICH in right frontotemporal region CTA head & neck focus of contrast enhancement within bed of right cerebral hematoma, no other underlying vascular abnormality Repeat CT head interval increase in size of right cerebral hemorrhage, now estimated volume 52 mL Repeat CT head 5/10 unchanged right cerebral hematoma with 6 mm of midline shift Repeat CT head 5/16 stable MRI stable hematoma and MLS, no abnormal enhancement  Stroke workup completed with echo normal EF, LDL 124 A1c 5.6, uds UDS + for barbiturates [facility] medication) No antithrombotic prior to admission,  No antithrombotic secondary to ICH Continue PT OT with plan for CIR  Continue Seroquel  50 twice daily, as needed sedatives  Cerebral edema W/ Large right ICH with 6 mm midline shift noted on CT  5/10 and 5/11: S/P 3% hypertonic saline In ICU.  Continue to monitor sodium MRI stable hematoma and embolus  Left upper extremity arm redness: Checking duplex. Fever, likely central fever: CXR negative UA negative,monitor.  Low-grade temperature monitor closely remains with normal WBC count. Having cough so we will obtain chest x-ray  Hypertension PTA not on meds, In ICU needed Cleviprex  drip. BP well-controlled on amlodipine  10 Coreg  25 twice daily, losartan  100 SBP goal less than 160 AND Long-term BP goal normotensive   Hyperlipidemia: LDL 124, goal < 70.  Not on statin start once LFT improves also Treated  Hypokalemia: replaced  Alcohol withdrawal W/ alcohol abuse drinking 2-3/d EtOH level on admission 264. Initially on Precedex  on CIWA protocol Ativan .  At this time overall stable continue Ativan  as needed, Seroquel  ETOH abuse cessation has been discussed    Thrombocytopenia: Likely from underlying liver dysfunction/alcohol abuse, received platelet transfusion x 1.  Improved  Elevated LFTs: Transaminitis likely from alcohol abuse remain elevated continue to trend and LFTs seems to be improving slowly. Likely in the setting of alcohol abuse, monitor LFTs and labs-slightly improving.Aaron Aas Recent Labs  Lab 12/20/23 0558 12/20/23 1117 12/21/23 0930 12/22/23 0631 12/23/23 0732 12/24/23 0849 12/25/23 0625 12/26/23 0631  AST 122*  --  130* 160* 232* 321* 235* 185*  ALT 126*  --  169* 179* 268* 355* 372* 341*  ALKPHOS 60  --  76 65 75 82 93 92  BILITOT 2.0*  --  1.6* 1.6* 1.6* 1.6* 1.2 0.9  PROT 7.4  --  7.5 7.2 7.5 7.5 7.5 7.4  ALBUMIN 3.1*  --  3.2* 2.9* 3.0* 3.0* 2.9* 3.0*  AMMONIA  --  47*  --   --   --   --   --   --   PLT 46*  --  55* 70*  --   --  189 212    Dysphagia: Cont DYS diet per SLP-was needing core track tube feeding in ICU 5/16.  Doing well with po intake.  DVT prophylaxis: heparin  injection 5,000 Units Start: 12/18/23 2215 SCD's Start: 12/16/23  0115 Code Status:   Code Status: Full Code Family Communication: plan of care discussed with patient/family at bedside. Patient status is: Remains hospitalized because of severity of illness Level of care: Telemetry Medical   Dispo: The patient is from: home            Anticipated disposition:CIt appears not a candidate for CIR, wife and patient do not want to go to SNF but rather HH>TOC consulted for home health.  If CIR is feasible patient will go to CIR  Objective: Vitals last 24 hrs: Vitals:   12/25/23 2351 12/26/23 0359 12/26/23 0828 12/26/23 0955  BP: 120/83 (!) 126/97 (!) 136/101 120/82  Pulse: 72 74 78 82  Resp: 18 18 20    Temp: 98.5 F (36.9 C) 98.8 F (37.1 C) 98.6 F (37 C)   TempSrc: Oral Oral Oral   SpO2: 100% 100% 100%   Weight:        Physical Examination: General exam: alert awake, oriented  HEENT:Oral mucosa moist, Ear/Nose WNL grossly Respiratory system: Bilaterally clear BS,no use of accessory muscle Cardiovascular system: S1 & S2 +, No JVD. Gastrointestinal system: Abdomen soft,NT,ND, BS+ Nervous System: Alert, awake, moving right side well able to move left leg sometime, left arm flaccid paralysis  Extremities: LE edema neg,distal peripheral pulses palpable and warm.  Skin: No rashes,no icterus. MSK: Normal muscle bulk,tone, power   Data Reviewed: I have personally reviewed following labs and imaging studies ( see epic result tab) CBC: Recent Labs  Lab 12/20/23 0558 12/21/23 0930 12/22/23 0631 12/25/23 0625 12/26/23 0631  WBC 4.8 5.8 5.9 6.1 5.9  HGB 13.8 14.3 13.2 12.2* 12.2*  HCT 40.6 41.9 40.3 37.6* 38.2*  MCV 92.9 92.5 93.1 94.0 95.3  PLT 46* 55* 70* 189 212   CMP: Recent Labs  Lab 12/22/23 0631 12/22/23 1150 12/22/23 2353 12/23/23 0732 12/24/23 0849 12/25/23 0625 12/26/23 0631  NA 137   < > 136 135 135 136 140  K 3.6  --   --  3.3* 3.8 3.4* 3.9  CL 109  --   --  104 105 106 110  CO2 19*  --   --  20* 19* 20* 21*  GLUCOSE 95   --   --  96 96 95 98  BUN 10  --   --  15 12 16 16   CREATININE 0.67  --   --  0.61 0.71 0.68 0.70  CALCIUM  8.6*  --   --  8.8* 8.9 9.0 9.0  MG 2.1  --   --  2.2 2.1 2.2 2.3  PHOS 4.3  --   --  3.6 3.4 4.0 4.0   < > = values in this interval not displayed.   GFR: CrCl cannot be calculated (Unknown ideal weight.). Recent Labs  Lab 12/22/23 0631 12/23/23 0732 12/24/23 0849 12/25/23 0625 12/26/23 0631  AST 160* 232* 321* 235* 185*  ALT 179* 268* 355* 372* 341*  ALKPHOS 65 75 82 93 92  BILITOT 1.6* 1.6* 1.6* 1.2 0.9  PROT 7.2 7.5 7.5 7.5 7.4  ALBUMIN 2.9* 3.0*  3.0* 2.9* 3.0*   No results for input(s): "LIPASE", "AMYLASE" in the last 168 hours.  Recent Labs  Lab 12/20/23 1117  AMMONIA 47*   Coagulation Profile:  No results for input(s): "INR", "PROTIME" in the last 168 hours.  Unresulted Labs (From admission, onward)     Start     Ordered   12/25/23 0500  Comprehensive metabolic panel with GFR  Daily,   R     Question:  Specimen collection method  Answer:  Lab=Lab collect   12/24/23 0900   12/25/23 0500  CBC  Daily,   R     Question:  Specimen collection method  Answer:  Lab=Lab collect   12/24/23 0900           Antimicrobials/Microbiology: Anti-infectives (From admission, onward)    None         Component Value Date/Time   SDES URINE, CATHETERIZED 07/31/2012 1953   SPECREQUEST NONE 07/31/2012 1953   CULT NO GROWTH 07/31/2012 1953   REPTSTATUS 08/02/2012 FINAL 07/31/2012 1953    Procedures:  Medications reviewed:  Scheduled Meds:  amLODipine   10 mg Oral Daily   carvedilol   25 mg Oral BID WC   Chlorhexidine  Gluconate Cloth  6 each Topical Daily   feeding supplement  237 mL Oral BID BM   folic acid   1 mg Oral Daily   heparin  injection (subcutaneous)  5,000 Units Subcutaneous Q8H   lactulose   30 g Oral BID   losartan   100 mg Oral Daily   multivitamin with minerals  1 tablet Oral Daily   nicotine   21 mg Transdermal Daily   ondansetron  (ZOFRAN ) IV  4 mg  Intravenous Once   mouth rinse  15 mL Mouth Rinse 4 times per day   pantoprazole  (PROTONIX ) IV  40 mg Intravenous QHS   QUEtiapine   50 mg Oral BID   sodium chloride  flush  3 mL Intravenous Once   thiamine   100 mg Oral Daily   Continuous Infusions:  Lesa Rape, MD [facility] 12/26/2023, 12:07 PM

## 2023-12-27 DIAGNOSIS — I629 Nontraumatic intracranial hemorrhage, unspecified: Secondary | ICD-10-CM | POA: Diagnosis not present

## 2023-12-27 LAB — COMPREHENSIVE METABOLIC PANEL WITH GFR
ALT: 429 U/L — ABNORMAL HIGH (ref 0–44)
AST: 241 U/L — ABNORMAL HIGH (ref 15–41)
Albumin: 3.1 g/dL — ABNORMAL LOW (ref 3.5–5.0)
Alkaline Phosphatase: 100 U/L (ref 38–126)
Anion gap: 10 (ref 5–15)
BUN: 15 mg/dL (ref 6–20)
CO2: 19 mmol/L — ABNORMAL LOW (ref 22–32)
Calcium: 9.1 mg/dL (ref 8.9–10.3)
Chloride: 108 mmol/L (ref 98–111)
Creatinine, Ser: 0.67 mg/dL (ref 0.61–1.24)
GFR, Estimated: >60 mL/min (ref >60)
Glucose, Bld: 107 mg/dL — ABNORMAL HIGH (ref 70–99)
Potassium: 4.1 mmol/L (ref 3.5–5.1)
Sodium: 137 mmol/L (ref 135–145)
Total Bilirubin: 0.9 mg/dL (ref 0.0–1.2)
Total Protein: 7.5 g/dL (ref 6.5–8.1)

## 2023-12-27 LAB — CBC
HCT: 39.1 % (ref 39.0–52.0)
Hemoglobin: 13.1 g/dL (ref 13.0–17.0)
MCH: 31.5 pg (ref 26.0–34.0)
MCHC: 33.5 g/dL (ref 30.0–36.0)
MCV: 94 fL (ref 80.0–100.0)
Platelets: 238 10*3/uL (ref 150–400)
RBC: 4.16 MIL/uL — ABNORMAL LOW (ref 4.22–5.81)
RDW: 13.6 % (ref 11.5–15.5)
WBC: 8.7 10*3/uL (ref 4.0–10.5)
nRBC: 0 % (ref 0.0–0.2)

## 2023-12-27 MED ORDER — PANTOPRAZOLE SODIUM 40 MG PO TBEC
40.0000 mg | DELAYED_RELEASE_TABLET | Freq: Every day | ORAL | Status: DC
  Administered 2023-12-27 – 2023-12-30 (×4): 40 mg via ORAL
  Filled 2023-12-27 (×4): qty 1

## 2023-12-27 NOTE — Plan of Care (Signed)
 Assisted with bath per staff. Explained that he will go to rehab at discharge to help learn with care after his stroke. He talks with MD [facility] in his language. Family brings food to eat. Ensure taken. Left arm elevated on pillow as MD requested.    Problem: Education: Goal: Knowledge of disease or condition will improve Outcome: Progressing   Problem: Coping: Goal: Will verbalize positive feelings about self Outcome: Progressing   Problem: Self-Care: Goal: Ability to participate in self-care as condition permits will improve Outcome: Progressing   Problem: Activity: Goal: Risk for activity intolerance will decrease Outcome: Progressing   Problem: Nutrition: Goal: Adequate nutrition will be maintained Outcome: Progressing   Problem: Coping: Goal: Level of anxiety will decrease Outcome: Progressing   Problem: Skin Integrity: Goal: Risk for impaired skin integrity will decrease Outcome: Progressing   Problem: Safety: Goal: Ability to remain free from injury will improve Outcome: Progressing
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
## 2023-12-27 NOTE — Progress Notes (Signed)
 Physical Therapy Treatment Patient Details Name: Troy Bishop MRN: [id number] DOB: [birth year] Today's Date: 12/27/2023   History of Present Illness Pt is a 33 y.o. male presenting 5/10 after sudden onset L weakness and collapse. CTH with large L subcortical hemorrhage, repeat CT 1 hour later showing progression of ICH from 35mL to 52mL with small volume hemorrhage within the  right lateral ventricle, consistent with intraventricular extension. PMH significant of uncontrolled HTN, cirrhosis due to alcohol abuse, thrombocytopenia    PT Comments  Pt tolerated treatment well today. Pt with significant improvement compared to previous session. Pt able to stand x3 with +2 Min A HHA. Pt noted to lean to the L when standing and requiring max cues for upright posture and to find midline in the mirror. Pt also only required Min-Mod A for sitting balance. Pt continues to report significant L hip pain. No change in DC/DME recs at this time. PT will continue to follow.     If plan is discharge home, recommend the following: Two people to help with walking and/or transfers;Two people to help with bathing/dressing/bathroom   Can travel by private vehicle        Equipment Recommendations  Other (comment) (Per accepting facility)    Recommendations for Other Services       Precautions / Restrictions Precautions Precautions: Fall;Other (comment) Recall of Precautions/Restrictions: Impaired Precaution/Restrictions Comments: L hemi, neglect Restrictions Weight Bearing Restrictions Per Provider Order: No     Mobility  Bed Mobility Overal bed mobility: Needs Assistance Bed Mobility: Supine to Sit, Sit to Supine, Rolling Rolling: Min assist   Supine to sit: +2 for physical assistance, Max assist Sit to supine: +2 for physical assistance, Total assist   General bed mobility comments: Assist for L sided management, cues for initiation. L Lateral lean and anterior lean when seated EOB.     Transfers Overall transfer level: Needs assistance Equipment used: 2 person hand held assist Transfers: Sit to/from Stand Sit to Stand: +2 physical assistance, Min assist           General transfer comment: Pt able to stand x3 with +2 Min A. Pt noted to lean to the L when standing and requiring max cues for upright posture and to find midline in the mirror.    Ambulation/Gait               General Gait Details: unable   Stairs             Wheelchair Mobility     Tilt Bed    Modified Rankin (Stroke Patients Only) Modified Rankin (Stroke Patients Only) Pre-Morbid Rankin Score: No symptoms Modified Rankin: Severe disability     Balance Overall balance assessment: Needs assistance Sitting-balance support: Feet supported Sitting balance-Leahy Scale: Poor Sitting balance - Comments: L lateral and anterior lean. Min to Mod A.   Standing balance support: Bilateral upper extremity supported Standing balance-Leahy Scale: Zero Standing balance comment: reliant on therapists                            Communication Communication Communication: Impaired Factors Affecting Communication: Reduced clarity of speech  Cognition Arousal: Alert Behavior During Therapy: Flat affect, Restless   PT - Cognitive impairments: Difficult to assess Difficult to assess due to: Impaired communication                     PT - Cognition Comments: Difficult to understand speech, follows commands intermittently.  Following commands: Impaired Following commands impaired: Follows one step commands inconsistently    Cueing Cueing Techniques: Verbal cues, Gestural cues, Tactile cues, Visual cues  Exercises      General Comments General comments (skin integrity, edema, etc.): VSS      Pertinent Vitals/Pain Pain Assessment Pain Assessment: Faces Faces Pain Scale: Hurts whole lot Pain Location: L hip (?) pt frequently stating "here" Pain Descriptors /  Indicators: Discomfort, Moaning, Grimacing Pain Intervention(s): Monitored during session, Limited activity within patient's tolerance, Repositioned    Home Living                          Prior Function            PT Goals (current goals can now be found in the care plan section) Progress towards PT goals: Progressing toward goals    Frequency    Min 3X/week      PT Plan      Co-evaluation              AM-PAC PT "6 Clicks" Mobility   Outcome Measure  Help needed turning from your back to your side while in a flat bed without using bedrails?: A Lot Help needed moving from lying on your back to sitting on the side of a flat bed without using bedrails?: Total Help needed moving to and from a bed to a chair (including a wheelchair)?: Total Help needed standing up from a chair using your arms (e.g., wheelchair or bedside chair)?: A Lot Help needed to walk in hospital room?: Total Help needed climbing 3-5 steps with a railing? : Total 6 Click Score: 8    End of Session Equipment Utilized During Treatment: Gait belt Activity Tolerance: Patient tolerated treatment well Patient left: in bed;with call bell/phone within reach;with bed alarm set;with family/visitor present Nurse Communication: Mobility status;Need for lift equipment PT Visit Diagnosis: Hemiplegia and hemiparesis;Unsteadiness on feet (R26.81) Hemiplegia - Right/Left: Left Hemiplegia - dominant/non-dominant: Non-dominant Hemiplegia - caused by: Nontraumatic intracerebral hemorrhage     Time: 1415-1443 PT Time Calculation (min) (ACUTE ONLY): 28 min  Charges:    $Therapeutic Activity: 23-37 mins PT General Charges $$ ACUTE PT VISIT: 1 Visit                     Troy Bishop, PT, DPT Acute Rehab Services [id number]    Troy Bishop 12/27/2023, 2:57 PM

## 2023-12-27 NOTE — Consult Note (Signed)
 Physical Medicine and Rehabilitation Consult Reason for Consult: ICH Referring Physician: Lesa Rape, MD   HPI: Troy Bishop is a 33 y.o. male with a PMH of HTN, seizure, and daily alcohol use, who was admitted with acute onset left sided weakness. He was found to have a large right subcortical ICH. He was admitted on 5/10. Repeat CT head showed enlargement on the ICH and he received PLT transfusion for PC 54. He has been off Clevipres for multiple days and started on oral medications. He has been started on the CIWA protocol for withdrawal. Core trak tube has been placed with TF infusing. He has elevated livery enzymes and central fevers. Physical Medicine & Rehabilitation was consulted to assess candidacy for CIR.      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 Heavy L Lateral lean and anterior lean when seated EOB. Transfers Overall transfer level: Needs assistance Equipment used: None Transfers: Sit to/from Stand Sit to Stand: Max assist, +2 physical assistance General transfer comment: deferred Ambulation/Gait General Gait Details: unable    ADL: ADL Overall ADL's : Needs  assistance/impaired Eating/Feeding: NPO Grooming: Moderate assistance, Maximal assistance, Sitting Grooming Details (indicate cue type and reason): cues for receiving grooming items Upper Body Bathing: Set up Upper Body Bathing Details (indicate cue type and reason): washes L arm with RUE Lower Body Bathing: Total assistance, +2 for physical assistance, +2 for safety/equipment Upper Body Dressing : Maximal assistance, Sitting Lower Body Dressing: Maximal assistance Lower Body Dressing Details (indicate cue type and reason): to don socks using figure 4 from bed level. Cues for problem solving, more reliant on OT to place LLE Toilet Transfer: Maximal assistance, +2 for physical assistance, +2 for safety/equipment Toilet Transfer Details (indicate cue type and reason): stedy Functional mobility during ADLs: +2 for physical assistance, Total assistance  Cognition: Cognition Overall Cognitive Status: Impaired/Different from baseline Orientation Level: Oriented X4 Year: 2025 Month: May Day of Week: Correct Attention: Sustained Sustained Attention: Impaired Sustained Attention Impairment: Verbal basic, Functional basic Awareness: Impaired Awareness Impairment: Intellectual impairment Problem Solving: Impaired Problem Solving Impairment: Functional basic Executive Function: Self Monitoring Self Monitoring: Impaired Self Monitoring Impairment: Functional basic Behaviors: Restless, Impulsive, Perseveration Cognition Arousal: Alert Behavior During Therapy: Flat affect, Restless Overall Cognitive Status: Impaired/Different from baseline   ROS LUE >LLE weakness Past Medical History:  Diagnosis Date   Liver disease    Painful orthopaedic hardware (HCC) 07/2017   right tibia   Past Surgical History:  Procedure Laterality Date   BREATH TEK H PYLORI N/A 09/08/2016   Procedure: BREATH TEK [name];  Surgeon: [name], MD;  Location: [facility];  Service: Gastroenterology;   Laterality: N/A;   HARDWARE REMOVAL Left 07/27/2017   Procedure: Removal of deep implants right proximal and distal tibia;  Surgeon: Amada Backer, MD;  Location: [facility];  Service: Orthopedics;  Laterality: Left;   TENDON REPAIR Left 08/14/2014   Procedure: LEFT HAND WOUND EXPLORATION AND TENDON REPAIR;  Surgeon: Shellie Dials, MD;  Location: [facility];  Service: Orthopedics;  Laterality: Left;   TIBIA IM NAIL INSERTION  07/31/2012   Procedure: INTRAMEDULLARY (IM) NAIL TIBIAL;  Surgeon: Amada Backer, MD;  Location: [facility];  Service: Orthopedics;  Laterality: Left;   UPPER GI ENDOSCOPY  07/25/2016   Family History  Problem Relation Age of Onset   Esophageal cancer Neg Hx    Liver disease Neg Hx    Colon cancer Neg Hx    Social History:  reports that he quit smoking about 9 years ago. His smoking use included cigarettes. He has never used smokeless tobacco. He reports current alcohol use. He reports that he does not use drugs. Allergies: No Known Allergies Medications Prior to Admission  Medication Sig Dispense Refill   risankizumab-rzaa (SKYRIZI PEN) 150 MG/ML pen Inject 150 mg as directed every 3 (three) months.       Blood pressure (!) 130/91, pulse 82, temperature 97.8 F (36.6 C), temperature source Oral, resp. rate 17, weight 61.5 kg, SpO2 100%. Physical Exam Gen: no distress, normal appearing HEENT: oral mucosa pink and moist, NCAT Cardio: Reg rate Chest: normal effort, normal rate of breathing Abd: soft, non-distended ZOX:WRUE LUE swelling Psych: pleasant, flat affect Skin: intact Neuro: Alert, 5/5 right sided strength, LUE is flaccid, moves LLE minimally, difficulty following commands  Results for orders placed or performed during the hospital encounter of 12/16/23 (from the past 24 hours)  Comprehensive metabolic panel with GFR     Status: Abnormal   Collection Time: 12/27/23  5:19 AM  Result Value Ref Range   Sodium 137 135 - 145 mmol/L    Potassium 4.1 3.5 - 5.1 mmol/L   Chloride 108 98 - 111 mmol/L   CO2 19 (L) 22 - 32 mmol/L   Glucose, Bld 107 (H) 70 - 99 mg/dL   BUN 15 6 - 20 mg/dL   Creatinine, Ser 4.54 0.61 - 1.24 mg/dL   Calcium  9.1 8.9 - 10.3 mg/dL   Total Protein 7.5 6.5 - 8.1 g/dL   Albumin 3.1 (L) 3.5 - 5.0 g/dL   AST 098 (H) 15 - 41 U/L   ALT 429 (H) 0 - 44 U/L   Alkaline Phosphatase 100 38 - 126 U/L   Total Bilirubin 0.9 0.0 - 1.2 mg/dL   GFR, Estimated >11 >91 mL/min   Anion gap 10 5 - 15  CBC     Status: Abnormal   Collection Time: 12/27/23  5:19 AM  Result Value Ref Range   WBC 8.7 4.0 - 10.5 K/uL   RBC 4.16 (L) 4.22 - 5.81 MIL/uL   Hemoglobin 13.1 13.0 - 17.0 g/dL   HCT 47.8 29.5 - 62.1 %   MCV 94.0 80.0 - 100.0 fL   MCH 31.5 26.0 - 34.0 pg   MCHC 33.5 30.0 - 36.0 g/dL   RDW 30.8 65.7 - 84.6 %   Platelets 238 150 - 400 K/uL   nRBC 0.0 0.0 - 0.2 %   VAS US  UPPER EXTREMITY VENOUS DUPLEX Result Date: 12/26/2023 UPPER VENOUS STUDY  Patient Name:  Troy Bishop  Date of Exam:   12/26/2023 Medical Rec #: [id number]  Accession #:    [id number] Date of Birth: 1990-08-15   Patient Gender: M Patient Age:   68 years Exam Location:  [facility] Procedure:      VAS US  UPPER EXTREMITY VENOUS DUPLEX Referring Phys: RAMESH KC --------------------------------------------------------------------------------  Indications: Pain, and redness. Unable to move left arm. Comparison Study: No priors. Performing Technologist: Franky Ivanoff Sturdivant-Jones RDMS, RVT  Examination Guidelines: A complete evaluation includes B-mode imaging, spectral Doppler, color Doppler, and power Doppler as needed of all accessible portions of each vessel. Bilateral testing is considered an integral part of a complete examination. Limited examinations for reoccurring indications may be performed as noted.  Right Findings: +----------+------------+---------+-----------+----------+-------+ RIGHT     CompressiblePhasicitySpontaneousPropertiesSummary  +----------+------------+---------+-----------+----------+-------+ Subclavian               Yes       Yes                      +----------+------------+---------+-----------+----------+-------+  Left Findings: +----------+------------+---------+-----------+----------+-------+ LEFT      CompressiblePhasicitySpontaneousPropertiesSummary +----------+------------+---------+-----------+----------+-------+ IJV           Full       Yes       Yes                      +----------+------------+---------+-----------+----------+-------+ Subclavian    Full       Yes       Yes                      +----------+------------+---------+-----------+----------+-------+ Axillary      Full       Yes       Yes                      +----------+------------+---------+-----------+----------+-------+ Brachial      Full                                          +----------+------------+---------+-----------+----------+-------+ Radial        Full                                          +----------+------------+---------+-----------+----------+-------+ Ulnar         Full                                          +----------+------------+---------+-----------+----------+-------+ Cephalic      None       No        No                Acute  +----------+------------+---------+-----------+----------+-------+ Basilic       Full                                          +----------+------------+---------+-----------+----------+-------+  Summary:  Right: No evidence of thrombosis in the subclavian.  Left: No evidence of deep vein thrombosis in the upper extremity. Findings consistent with acute superficial vein thrombosis involving the left cephalic vein.  *See table(s) above for measurements and observations.  Diagnosing physician: Genny Kid MD Electronically signed by Genny Kid MD on 12/26/2023 at 7:09:07 PM.    Final    DG [facility]  Result Date: 12/26/2023 CLINICAL  DATA:  Fever. EXAM: PORTABLE CHEST 1 VIEW COMPARISON:  Chest radiograph dated 12/17/2023. FINDINGS: No focal consolidation, pleural effusion, or pneumothorax. Stable cardiac silhouette. No acute osseous pathology. IMPRESSION: No active disease. Electronically Signed   By: Angus Bark M.D.   On: 12/26/2023 12:40    Assessment/Plan: Diagnosis: ICH Does the need for close, 24 hr/day medical supervision in concert with the patient's rehab needs make it unreasonable for this patient to be served in a less intensive setting? Yes Co-Morbidities requiring supervision/potential complications:  1) Moderate malnutrition: provide dietary education 2) History of alcohol abuse: consider replacing folic acid  and vitamin B 1 supplements with Metanx and Vitamin B complex 3) HTN: continue Cozaar  and amlodipine  4) Active smoker: continue nicotine  patch 5) Transaminitis, worsening, consider discontinuing tylenol  Due to bladder management, bowel management, safety, skin/wound care, disease management, medication administration, pain management, and patient education, does the patient require 24 hr/day rehab nursing? Yes Does the patient require coordinated care of a physician, rehab nurse, therapy disciplines of PT, OT, SLP to address physical and functional deficits in the context of the above medical diagnosis(es)? Yes Addressing deficits in the following areas: balance, endurance, locomotion, strength, transferring, bowel/bladder control, bathing, dressing, feeding, grooming, toileting, cognition, and psychosocial support Can the patient actively participate in an intensive therapy program of at least 3 hrs of therapy per day at least 5 days per week? No, we will continue to follow, patient should be at least MaxAx2 in bed mobility in order to tolerate CIR The potential for patient to make measurable gains while on inpatient rehab is excellent Anticipated functional outcomes upon discharge from inpatient rehab  are mod assist  with PT, min assist with OT, supervision with SLP. Estimated rehab length of stay to reach the above functional goals is: 2-3 weeks Anticipated discharge destination: Home Overall Rehab/Functional Prognosis: fair  POST ACUTE RECOMMENDATIONS: This patient's condition is appropriate for continued rehabilitative care in the following setting: CIR once able to perform bed mobility MaxAx2 Patient has agreed to participate in recommended program. N/A Note that insurance prior authorization may be required for reimbursement for recommended care.    I have personally performed a face to face diagnostic evaluation of this patient. Additionally, I have examined the patient's medical record including any pertinent labs and radiographic images.    Thanks,  Liam Redhead, MD 12/27/2023

## 2023-12-27 NOTE — Progress Notes (Signed)
 PROGRESS NOTE Troy Bishop  ZOX:[id number] DOB: 1991/01/05 DOA: 12/16/2023 PCP: Pcp, No  Brief Narrative/Hospital Course: 33 yo male with past medical history of hypertension, seizure and daily alcohol use admitted for acute onset left-sided weakness. He was found to have a large right subcortical ICH. admitted on 5/10. Repeat Ct head with enlargement of ICH and he received PLT transfusion for Plt 54 . was on clevipres has been off for multiple days and started on Po Meds. On CIWA protocol for withdrawal. core track tube placed with TF infusing. Elevated liver enzymes, fevers which are likely central. Therapy recommending CIR   Subjective: Patient seen examined Is doing well overall no complaints Eager to go to rehab No further overnight Wife is at the bedside Is lfts are slightly mostly   Assessment and plan:  Right basal ganglia large ICH Acute metabolic encephalopathy: Likely hypertensive ICH. Patient admitted under code stroke complaints CT heads CTA head and neck as below Code Stroke CT head 35 mm ICH in right frontotemporal region CTA head & neck focus of contrast enhancement within bed of right cerebral hematoma, no other underlying vascular abnormality Repeat CT head interval increase in size of right cerebral hemorrhage, now estimated volume 52 mL Repeat CT head 5/10 unchanged right cerebral hematoma with 6 mm of midline shift Repeat CT head 5/16 stable MRI stable hematoma and MLS, no abnormal enhancement  Stroke workup completed with echo normal EF, LDL 124 A1c 5.6, uds UDS + for barbiturates [facility] medication) No antithrombotic prior to admission,  No antithrombotic secondary to ICH Continue PT OT with plan for CIR> if CIR is not an option recommended skilled nursing facility but patient and family opposed to SNF  Eager to go to rehab once approved Continue Seroquel  50 twice daily, as needed sedatives  Cerebral edema W/ Large right ICH with 6 mm midline shift noted on CT 5/10  and 5/11: S/P 3% hypertonic saline In ICU.  Continue to monitor sodium MRI stable hematoma and embolus  Left upper extremity superior thrombophlebitis: Continue supportive care  Fever, likely central fever: CXR negative UA negative,monitor.  Low-grade temperature monitor closely remains with normal WBC count. Having cough or chest x-ray recheck on 5/20 negative.  , ?  Fever from thrombophlebitis  If febrile again consider oral antibiotics   Hypertension PTA not on meds, In ICU needed Cleviprex  drip. BP well-controlled on current regimen with amlodipine  10 Coreg  25 twice daily, losartan  100. Goal sbp<160   Hyperlipidemia: LDL 124, goal < 70.  Not on statin start once LFT improves also Treated  Hypokalemia: replaced  Alcohol withdrawal W/ alcohol abuse drinking 2-3/d EtOH level on admission 264. Initially on Precedex  on CIWA protocol Ativan .  At this time overall stable continue Ativan  as needed, Seroquel  ETOH abuse cessation has been discussed    Thrombocytopenia: Likely from underlying liver dysfunction/alcohol abuse, received platelet transfusion x 1.  Improved  Elevated LFTs: Transaminitis likely from alcohol abuse remain elevated continue to trend and LFTs seems to be improving slowly. Likely in the setting of alcohol abuse, LFTs fluctuating, continue to monitor  Recent Labs  Lab 12/20/23 1117 12/21/23 0930 12/21/23 0930 12/22/23 0631 12/23/23 0732 12/24/23 0849 12/25/23 0625 12/26/23 0631 12/27/23 0519  AST  --  130*   < > 160* 232* 321* 235* 185* 241*  ALT  --  169*   < > 179* 268* 355* 372* 341* 429*  ALKPHOS  --  76   < > 65 75 82 93 92 100  BILITOT  --  1.6*   < > 1.6* 1.6* 1.6* 1.2 0.9 0.9  PROT  --  7.5   < > 7.2 7.5 7.5 7.5 7.4 7.5  ALBUMIN  --  3.2*   < > 2.9* 3.0* 3.0* 2.9* 3.0* 3.1*  AMMONIA 47*  --   --   --   --   --   --   --   --   PLT  --  55*  --  70*  --   --  189 212 238   < > = values in this interval not displayed.   Dysphagia: Cont DYS  diet per SLP-was needing core track tube feeding in ICU 5/16.  Doing well with po intake.  DVT prophylaxis: heparin  injection 5,000 Units Start: 12/18/23 2215 SCD's Start: 12/16/23 0115 Code Status:   Code Status: Full Code Family Communication: plan of care discussed with patient/family at bedside. Patient status is: Remains hospitalized because of severity of illness Level of care: Telemetry Medical   Dispo: The patient is from: home            Anticipated disposition:awaiting CIR  Objective: Vitals last 24 hrs: Vitals:   12/26/23 2328 12/27/23 0335 12/27/23 0801 12/27/23 0859  BP: 123/83 (!) 133/93 125/87 125/87  Pulse: 82 79 76 76  Resp: 18 18 18    Temp: 98.5 F (36.9 C) 98 F (36.7 C) 98.6 F (37 C)   TempSrc: Oral  Oral   SpO2: 98% 100% 98%   Weight:        Physical Examination: General exam: alert awake, oriented  HEENT:Oral mucosa moist, Ear/Nose WNL grossly Respiratory system: Bilaterally clear BS,no use of accessory muscle Cardiovascular system: S1 & S2 +, No JVD. Gastrointestinal system: Abdomen soft,NT,ND, BS+ Nervous System: Alert, awake, moving all extremities BUT flaccid paralysis on the left arm more than the left Extremities: LE edema neg, mild edema swelling in the left upper arm  Skin: No rashes,no icterus. MSK: Normal muscle bulk,tone, power   Data Reviewed: I have personally reviewed following labs and imaging studies ( see epic result tab) CBC: Recent Labs  Lab 12/21/23 0930 12/22/23 0631 12/25/23 0625 12/26/23 0631 12/27/23 0519  WBC 5.8 5.9 6.1 5.9 8.7  HGB 14.3 13.2 12.2* 12.2* 13.1  HCT 41.9 40.3 37.6* 38.2* 39.1  MCV 92.5 93.1 94.0 95.3 94.0  PLT 55* 70* 189 212 238   CMP: Recent Labs  Lab 12/22/23 0631 12/22/23 1150 12/23/23 0732 12/24/23 0849 12/25/23 0625 12/26/23 0631 12/27/23 0519  NA 137   < > 135 135 136 140 137  K 3.6  --  3.3* 3.8 3.4* 3.9 4.1  CL 109  --  104 105 106 110 108  CO2 19*  --  20* 19* 20* 21* 19*   GLUCOSE 95  --  96 96 95 98 107*  BUN 10  --  15 12 16 16 15   CREATININE 0.67  --  0.61 0.71 0.68 0.70 0.67  CALCIUM  8.6*  --  8.8* 8.9 9.0 9.0 9.1  MG 2.1  --  2.2 2.1 2.2 2.3  --   PHOS 4.3  --  3.6 3.4 4.0 4.0  --    < > = values in this interval not displayed.   GFR: CrCl cannot be calculated (Unknown ideal weight.). Recent Labs  Lab 12/23/23 0732 12/24/23 0849 12/25/23 0625 12/26/23 0631 12/27/23 0519  AST 232* 321* 235* 185* 241*  ALT 268* 355* 372* 341* 429*  ALKPHOS  75 82 93 92 100  BILITOT 1.6* 1.6* 1.2 0.9 0.9  PROT 7.5 7.5 7.5 7.4 7.5  ALBUMIN 3.0* 3.0* 2.9* 3.0* 3.1*   No results for input(s): "LIPASE", "AMYLASE" in the last 168 hours.  No results for input(s): "AMMONIA" in the last 168 hours.  Coagulation Profile:  No results for input(s): "INR", "PROTIME" in the last 168 hours.  Unresulted Labs (From admission, onward)     Start     Ordered   12/25/23 0500  Comprehensive metabolic panel with GFR  Daily,   R     Question:  Specimen collection method  Answer:  Lab=Lab collect   12/24/23 0900   12/25/23 0500  CBC  Daily,   R     Question:  Specimen collection method  Answer:  Lab=Lab collect   12/24/23 0900           Antimicrobials/Microbiology: Anti-infectives (From admission, onward)    None         Component Value Date/Time   SDES URINE, CATHETERIZED 07/31/2012 1953   SPECREQUEST NONE 07/31/2012 1953   CULT NO GROWTH 07/31/2012 1953   REPTSTATUS 08/02/2012 FINAL 07/31/2012 1953    Procedures:  Medications reviewed:  Scheduled Meds:  amLODipine   10 mg Oral Daily   carvedilol   25 mg Oral BID WC   feeding supplement  237 mL Oral BID BM   folic acid   1 mg Oral Daily   heparin  injection (subcutaneous)  5,000 Units Subcutaneous Q8H   lactulose   30 g Oral BID   losartan   100 mg Oral Daily   multivitamin with minerals  1 tablet Oral Daily   nicotine   21 mg Transdermal Daily   ondansetron  (ZOFRAN ) IV  4 mg Intravenous Once   mouth rinse  15  mL Mouth Rinse 4 times per day   pantoprazole  (PROTONIX ) IV  40 mg Intravenous QHS   polyethylene glycol  17 g Oral Daily   QUEtiapine   50 mg Oral BID   sodium chloride  flush  3 mL Intravenous Once   thiamine   100 mg Oral Daily   Continuous Infusions:  Lesa Rape, MD [facility] 12/27/2023, 11:44 AM

## 2023-12-28 ENCOUNTER — Inpatient Hospital Stay (HOSPITAL_COMMUNITY)

## 2023-12-28 DIAGNOSIS — I629 Nontraumatic intracranial hemorrhage, unspecified: Secondary | ICD-10-CM | POA: Diagnosis not present

## 2023-12-28 LAB — CBC
HCT: 39.3 % (ref 39.0–52.0)
Hemoglobin: 12.8 g/dL — ABNORMAL LOW (ref 13.0–17.0)
MCH: 30.4 pg (ref 26.0–34.0)
MCHC: 32.6 g/dL (ref 30.0–36.0)
MCV: 93.3 fL (ref 80.0–100.0)
Platelets: 268 10*3/uL (ref 150–400)
RBC: 4.21 MIL/uL — ABNORMAL LOW (ref 4.22–5.81)
RDW: 13.7 % (ref 11.5–15.5)
WBC: 7.7 10*3/uL (ref 4.0–10.5)
nRBC: 0 % (ref 0.0–0.2)

## 2023-12-28 LAB — COMPREHENSIVE METABOLIC PANEL WITH GFR
ALT: 404 U/L — ABNORMAL HIGH (ref 0–44)
AST: 212 U/L — ABNORMAL HIGH (ref 15–41)
Albumin: 3.1 g/dL — ABNORMAL LOW (ref 3.5–5.0)
Alkaline Phosphatase: 89 U/L (ref 38–126)
Anion gap: 11 (ref 5–15)
BUN: 15 mg/dL (ref 6–20)
CO2: 22 mmol/L (ref 22–32)
Calcium: 9.4 mg/dL (ref 8.9–10.3)
Chloride: 106 mmol/L (ref 98–111)
Creatinine, Ser: 0.64 mg/dL (ref 0.61–1.24)
GFR, Estimated: >60 mL/min (ref >60)
Glucose, Bld: 108 mg/dL — ABNORMAL HIGH (ref 70–99)
Potassium: 3.9 mmol/L (ref 3.5–5.1)
Sodium: 139 mmol/L (ref 135–145)
Total Bilirubin: 0.7 mg/dL (ref 0.0–1.2)
Total Protein: 7.6 g/dL (ref 6.5–8.1)

## 2023-12-28 MED ORDER — GABAPENTIN 100 MG PO CAPS
200.0000 mg | ORAL_CAPSULE | Freq: Two times a day (BID) | ORAL | Status: DC
  Administered 2023-12-28 – 2023-12-30 (×5): 200 mg via ORAL
  Filled 2023-12-28 (×5): qty 2

## 2023-12-28 NOTE — TOC Progression Note (Signed)
 Transition of Care [facility]) - Progression Note    Patient Details  Name: Troy Bishop MRN: [id number] Date of Birth: 03/24/1991  [facility]) CM/SW Contact  Jonathan Neighbor, RN Phone Number: 12/28/2023, 1:25 PM  Clinical Narrative:     Insurance pending for [employer]. TOC following.  Expected Discharge Plan: IP Rehab Facility    Expected Discharge Plan and Services                                               Social Determinants of Health (SDOH) Interventions SDOH Screenings   Food Insecurity: Patient Unable To Answer (12/16/2023)  Housing: Patient Unable To Answer (12/16/2023)  Transportation Needs: Patient Unable To Answer (12/16/2023)  Utilities: Patient Unable To Answer (12/16/2023)  Tobacco Use: Medium Risk (11/12/2023)    Readmission Risk Interventions     No data to display

## 2023-12-28 NOTE — Progress Notes (Signed)
 Inpatient Rehabilitation Admissions Coordinator   I have insurance approval for CIR admit but no bed available to admit to today. I am hopeful for a bed in the next 24 to 48 hrs. I met with patient and his wife at bedside and they are in agreement. Acute team and TOC made aware.  Jeannetta Millman, RN, MSN Rehab Admissions Coordinator [phone] 12/28/2023 3:10 PM

## 2023-12-28 NOTE — Plan of Care (Signed)
  Problem: Education: Goal: Knowledge of disease or condition will improve Outcome: Progressing Goal: Knowledge of secondary prevention will improve (MUST DOCUMENT ALL) Outcome: Progressing Goal: Knowledge of patient specific risk factors will improve (DELETE if not current risk factor) Outcome: Progressing   Problem: Intracerebral Hemorrhage Tissue Perfusion: Goal: Complications of Intracerebral Hemorrhage will be minimized Outcome: Progressing   Problem: Coping: Goal: Will verbalize positive feelings about self Outcome: Progressing Goal: Will identify appropriate support needs Outcome: Progressing   Problem: Health Behavior/Discharge Planning: Goal: Ability to manage health-related needs will improve Outcome: Progressing Goal: Goals will be collaboratively established with patient/family Outcome: Progressing   Problem: Self-Care: Goal: Ability to participate in self-care as condition permits will improve Outcome: Progressing Goal: Verbalization of feelings and concerns over difficulty with self-care will improve Outcome: Progressing Goal: Ability to communicate needs accurately will improve Outcome: Progressing   Problem: Nutrition: Goal: Risk of aspiration will decrease Outcome: Progressing Goal: Dietary intake will improve Outcome: Progressing   Problem: Education: Goal: Knowledge of General Education information will improve Description: Including pain rating scale, medication(s)/side effects and non-pharmacologic comfort measures Outcome: Progressing   Problem: Health Behavior/Discharge Planning: Goal: Ability to manage health-related needs will improve Outcome: Progressing   Problem: Clinical Measurements: Goal: Ability to maintain clinical measurements within normal limits will improve Outcome: Progressing Goal: Will remain free from infection Outcome: Progressing Goal: Diagnostic test results will improve Outcome: Progressing Goal: Respiratory  complications will improve Outcome: Progressing Goal: Cardiovascular complication will be avoided Outcome: Progressing   Problem: Activity: Goal: Risk for activity intolerance will decrease Outcome: Progressing   Problem: Nutrition: Goal: Adequate nutrition will be maintained Outcome: Progressing   Problem: Coping: Goal: Level of anxiety will decrease Outcome: Progressing   Problem: Elimination: Goal: Will not experience complications related to bowel motility Outcome: Progressing Goal: Will not experience complications related to urinary retention Outcome: Progressing   Problem: Pain Managment: Goal: General experience of comfort will improve and/or be controlled Outcome: Progressing   Problem: Safety: Goal: Ability to remain free from injury will improve Outcome: Progressing   Problem: Skin Integrity: Goal: Risk for impaired skin integrity will decrease Outcome: Progressing   Problem: Safety: Goal: Non-violent Restraint(s) Outcome: Progressing

## 2023-12-28 NOTE — Progress Notes (Signed)
 PROGRESS NOTE Troy Bishop  WJX:[id number] DOB: 1991-04-11 DOA: 12/16/2023 PCP: Pcp, No  Brief Narrative/Hospital Course: 33 yo male with past medical history of hypertension, seizure and daily alcohol use admitted for acute onset left-sided weakness. He was found to have a large right subcortical ICH. admitted on 5/10. Repeat Ct head with enlargement of ICH and he received PLT transfusion for Plt 54 . was on clevipres has been off for multiple days and started on Po Meds. On CIWA protocol for withdrawal. core track tube placed with TF infusing. Elevated liver enzymes, fevers which are likely central. Therapy recommending CIR   Subjective: Seen and examined Wife at the bedside Overnight afebrile vital stable LFTs slowly improving He is waiting for CIR Complains of pain in the left hip  Assessment and plan:  Right basal ganglia large ICH Acute metabolic encephalopathy: Likely hypertensive ICH. Patient admitted under code stroke complaints CT heads CTA head and neck as below Code Stroke CT head 35 mm ICH in right frontotemporal region CTA head & neck focus of contrast enhancement within bed of right cerebral hematoma, no other underlying vascular abnormality Repeat CT head> increase in size of right cerebral hemorrhage, estimated volume 52 mL Repeat CT head 5/10 unchanged right cerebral hematoma with 6 mm of midline shift Repeat CT head 5/16 stable MRI stable hematoma and MLS, no abnormal enhancement  Stroke workup completed :TTE normal EF, LDL 124 A1c 5.6 No antithrombotic prior to admission,  No antithrombotic secondary to ICH Continue PT OT and extensive rehabilitation awaiting for CIR patient and family opposed to SNF  Continue Seroquel  50 twice daily, as needed sedatives  Cerebral edema: Due to Large right ICH with 6 mm midline shift noted on CT 5/10 and 5/11: S/P 3% hypertonic saline In ICU. MRI stable  Left upper extremity superior thrombophlebitis: Continue supportive  care  Left hip pain: Will obtain x-ray, start gabapentin twice daily-watch for sedation  Transient fever, likely central fever: CXR negative UA negative,monitor.  No recurrence of fever.  Chest x-ray repeat has been unremarkable on 5/20  ? Fever from thrombophlebitis . If febrile again consider oral antibiotics   Hypertension PTA not on meds, In ICU needed Cleviprex  drip. BP well-controlled on current regimen with amlodipine  10 Coreg  25 twice daily, losartan  100. Goal sbp<160   Hyperlipidemia: LDL 124, goal < 70.  Not on statin start once LFT improves also Treated  Hypokalemia: Resolved.  Alcohol withdrawal W/ alcohol abuse drinking 2-3/d EtOH level on admission 264. Initially on Precedex  on CIWA protocol Ativan .  At this time overall stable continue Ativan  as needed, Seroquel  ETOH abuse cessation has been discussed    Thrombocytopenia: Likely from underlying liver dysfunction/alcohol abuse, rs/p platelet transfusion x 1. Now  Improved  Elevated LFTs: Transaminitis likely from alcohol abuse -LFTs very slow to improve.  Continue to monitor intermittently.   Recent Labs  Lab 12/22/23 0631 12/23/23 0732 12/24/23 2130 12/25/23 0625 12/26/23 0631 12/27/23 0519 12/28/23 0501  AST 160*   < > 321* 235* 185* 241* 212*  ALT 179*   < > 355* 372* 341* 429* 404*  ALKPHOS 65   < > 82 93 92 100 89  BILITOT 1.6*   < > 1.6* 1.2 0.9 0.9 0.7  PROT 7.2   < > 7.5 7.5 7.4 7.5 7.6  ALBUMIN 2.9*   < > 3.0* 2.9* 3.0* 3.1* 3.1*  PLT 70*  --   --  189 212 238 268   < > = values in this  interval not displayed.   Dysphagia: Cont DYS diet per SLP-was needing core track tube feeding in ICU 5/16.  Doing well with po intake.  DVT prophylaxis: heparin  injection 5,000 Units Start: 12/18/23 2215 SCD's Start: 12/16/23 0115 Code Status:   Code Status: Full Code Family Communication: plan of care discussed with patient/ wife at bedside. Patient status is: Remains hospitalized because of severity of  illness Level of care: Telemetry Medical   Dispo: The patient is from: home            Anticipated disposition:awaiting CIR  Objective: Vitals last 24 hrs: Vitals:   12/27/23 2042 12/28/23 0044 12/28/23 0452 12/28/23 0750  BP: 131/83 117/77 (!) 124/97 (!) 134/93  Pulse: 85 67 81 82  Resp:    19  Temp: 98.6 F (37 C) 98.8 F (37.1 C) 98.6 F (37 C) 98.2 F (36.8 C)  TempSrc: Oral Axillary Axillary Oral  SpO2: 96% 100% 100% 100%  Weight:        Physical Examination: General exam: alert awake, oriented at baseline, older than stated age HEENT:Oral mucosa moist, Ear/Nose WNL grossly Respiratory system: Bilaterally clear BS,no use of accessory muscle Cardiovascular system: S1 & S2 +, No JVD. Gastrointestinal system: Abdomen soft,NT,ND, BS+ Nervous System: Alert, awake, in place in the left upper extremity able to move left lower extremity some Extremities: LE edema neg,distal peripheral pulses palpable and warm.  Skin: No rashes,no icterus. MSK: Normal muscle bulk,tone, power   Data Reviewed: I have personally reviewed following labs and imaging studies ( see epic result tab) CBC: Recent Labs  Lab 12/22/23 0631 12/25/23 0625 12/26/23 0631 12/27/23 0519 12/28/23 0501  WBC 5.9 6.1 5.9 8.7 7.7  HGB 13.2 12.2* 12.2* 13.1 12.8*  HCT 40.3 37.6* 38.2* 39.1 39.3  MCV 93.1 94.0 95.3 94.0 93.3  PLT 70* 189 212 238 268   CMP: Recent Labs  Lab 12/22/23 0631 12/22/23 1150 12/23/23 0732 12/24/23 0849 12/25/23 0625 12/26/23 0631 12/27/23 0519 12/28/23 0501  NA 137   < > 135 135 136 140 137 139  K 3.6  --  3.3* 3.8 3.4* 3.9 4.1 3.9  CL 109  --  104 105 106 110 108 106  CO2 19*  --  20* 19* 20* 21* 19* 22  GLUCOSE 95  --  96 96 95 98 107* 108*  BUN 10  --  15 12 16 16 15 15   CREATININE 0.67  --  0.61 0.71 0.68 0.70 0.67 0.64  CALCIUM  8.6*  --  8.8* 8.9 9.0 9.0 9.1 9.4  MG 2.1  --  2.2 2.1 2.2 2.3  --   --   PHOS 4.3  --  3.6 3.4 4.0 4.0  --   --    < > = values in this  interval not displayed.   GFR: CrCl cannot be calculated (Unknown ideal weight.). Recent Labs  Lab 12/24/23 0849 12/25/23 0625 12/26/23 0631 12/27/23 0519 12/28/23 0501  AST 321* 235* 185* 241* 212*  ALT 355* 372* 341* 429* 404*  ALKPHOS 82 93 92 100 89  BILITOT 1.6* 1.2 0.9 0.9 0.7  PROT 7.5 7.5 7.4 7.5 7.6  ALBUMIN 3.0* 2.9* 3.0* 3.1* 3.1*   No results for input(s): "LIPASE", "AMYLASE" in the last 168 hours.  No results for input(s): "AMMONIA" in the last 168 hours.  Coagulation Profile:  No results for input(s): "INR", "PROTIME" in the last 168 hours.  Unresulted Labs (From admission, onward)     Start  Ordered   12/25/23 0500  Comprehensive metabolic panel with GFR  Daily,   R     Question:  Specimen collection method  Answer:  Lab=Lab collect   12/24/23 0900   12/25/23 0500  CBC  Daily,   R     Question:  Specimen collection method  Answer:  Lab=Lab collect   12/24/23 0900           Antimicrobials/Microbiology: Anti-infectives (From admission, onward)    None         Component Value Date/Time   SDES URINE, CATHETERIZED 07/31/2012 1953   SPECREQUEST NONE 07/31/2012 1953   CULT NO GROWTH 07/31/2012 1953   REPTSTATUS 08/02/2012 FINAL 07/31/2012 1953    Procedures:  Medications reviewed:  Scheduled Meds:  amLODipine   10 mg Oral Daily   carvedilol   25 mg Oral BID WC   feeding supplement  237 mL Oral BID BM   folic acid   1 mg Oral Daily   gabapentin   200 mg Oral BID   heparin  injection (subcutaneous)  5,000 Units Subcutaneous Q8H   lactulose   30 g Oral BID   losartan   100 mg Oral Daily   multivitamin with minerals  1 tablet Oral Daily   nicotine   21 mg Transdermal Daily   ondansetron  (ZOFRAN ) IV  4 mg Intravenous Once   mouth rinse  15 mL Mouth Rinse 4 times per day   pantoprazole   40 mg Oral Daily   polyethylene glycol  17 g Oral Daily   QUEtiapine   50 mg Oral BID   sodium chloride  flush  3 mL Intravenous Once   thiamine   100 mg Oral Daily    Continuous Infusions:  Lesa Rape, MD [facility] 12/28/2023, 9:55 AM

## 2023-12-28 NOTE — Progress Notes (Signed)
 Inpatient Rehabilitation Admissions Coordinator   I await insurance determination for possible CIR admit.  Jeannetta Millman, RN, MSN Rehab Admissions Coordinator [phone] 12/28/2023 12:18 PM

## 2023-12-28 NOTE — Progress Notes (Signed)
 Occupational Therapy Treatment Patient Details Name: Troy Bishop MRN: [id number] DOB: [birth year] Today's Date: 12/28/2023   History of present illness Pt is a 33 y.o. male presenting 5/10 after sudden onset L weakness and collapse. CTH with large L subcortical hemorrhage, repeat CT 1 hour later showing progression of ICH from 35mL to 52mL with small volume hemorrhage within the  right lateral ventricle, consistent with intraventricular extension. PMH significant of uncontrolled HTN, cirrhosis due to alcohol abuse, thrombocytopenia   OT comments  Pt very limited by L hip pain this session, not able to get to EOB without producing safety concerns. Pt noted to always have his L hip ext rotated and abducted, perhaps could be the source of his pain, performed L hip mobilization PROM and he is noted to have most pain with adduction and hip flexion beyond neutral. Performed PROM PNF movements to LUE, no noted AROM at this time. Perhaps with resolution of L hip pain pt will be able to progress more in therapy sessions. OT to continue following pt acutely to address deficits. Patient has the potential to reach Mod I and demos the ability to tolerate 3 hours of therapy. Pt would benefit from an intensive rehab program to help maximize functional independence.       If plan is discharge home, recommend the following:  Two people to help with walking and/or transfers;Two people to help with bathing/dressing/bathroom;Assist for transportation;Help with stairs or ramp for entrance;Direct supervision/assist for medications management;Direct supervision/assist for financial management;Assistance with feeding;Assistance with cooking/housework   Equipment Recommendations  Other (comment) (defer)    Recommendations for Other Services      Precautions / Restrictions Precautions Precautions: Fall;Other (comment) Recall of Precautions/Restrictions: Impaired Precaution/Restrictions Comments: L hemi,  neglect Restrictions Weight Bearing Restrictions Per Provider Order: No       Mobility Bed Mobility Overal bed mobility: Needs Assistance Bed Mobility: Rolling, Sidelying to Sit, Sit to Supine Rolling: Mod assist Sidelying to sit: Max assist   Sit to supine: Total assist, Used rails   General bed mobility comments: cues for pt to assist with use of bed rails, OT assist with LUE/LLE management. During efforts to get to EOB but became very restless from L hip pain and could not tolerate upright sitting. Attempted to sit pt upright x2 using different approaches but he would repel himself back into a supine position on the bed at make himself a risk to slide forward. Pt needing consistent cueing and redirection for his safety, deferred further EOB attempts for pt safety.    Transfers                         Balance Overall balance assessment: Needs assistance Sitting-balance support: Feet supported Sitting balance-Leahy Scale: Poor Sitting balance - Comments: post repulsion                                   ADL either performed or assessed with clinical judgement   ADL                                         General ADL Comments: Pt not tolerable to EOB sitting to progress with ADLs    Extremity/Trunk Assessment Upper Extremity Assessment LUE Deficits / Details: no active movement.   Lower Extremity Assessment LLE  Deficits / Details: No active movement noted, no response to painful stimuli        Vision       Perception     Praxis     Communication Communication Communication: Impaired Factors Affecting Communication: Reduced clarity of speech   Cognition Arousal: Alert Behavior During Therapy: Restless Cognition: Cognition impaired, Difficult to assess Difficult to assess due to: Impaired communication   Awareness: Online awareness impaired, Intellectual awareness impaired Memory impairment (select all impairments):  Short-term memory, Working Biochemist, clinical functioning impairment (select all impairments): Reasoning, Problem solving OT - Cognition Comments: pt requiring several cues for safety during bed mobility to reduce risk of sliding off bed                 Following commands: Impaired Following commands impaired: Follows one step commands inconsistently      Cueing   Cueing Techniques: Verbal cues, Gestural cues, Tactile cues, Visual cues  Exercises Other Exercises Other Exercises: LUE PNF D1 flexion/extension PROM with tapping of muscle bellies. Other Exercises: LUE PROM scapular protraction/retraction with tapping of muscle bellies. Other Exercises: L hip mobilization (adduction/abduction, flex/ext, circumduction)    Shoulder Instructions       General Comments Pt noted to always rest his LLE in an external rotated and abducted position, repositioned pt in a more sidelying position with LLE internally rotated as he is not able to maintain it in neutral despite many efforts of positioning attempts. Ice applied to L hip, his imaging was negative for any fxs or dislocations.    Pertinent Vitals/ Pain       Pain Assessment Pain Assessment: Faces Faces Pain Scale: Hurts whole lot Pain Location: L hip with any movement Pain Descriptors / Indicators: Discomfort, Moaning, Grimacing, Sharp Pain Intervention(s): Monitored during session, Limited activity within patient's tolerance, Repositioned, Ice applied, Other (comment) (ROM)  Home Living                                          Prior Functioning/Environment              Frequency  Min 2X/week        Progress Toward Goals  OT Goals(current goals can now be found in the care plan section)  Progress towards OT goals: Progressing toward goals  Acute Rehab OT Goals OT Goal Formulation: With patient Time For Goal Achievement: 01/01/24 Potential to Achieve Goals: Good  Plan       Co-evaluation                 AM-PAC OT "6 Clicks" Daily Activity     Outcome Measure   Help from another person eating meals?: A Lot Help from another person taking care of personal grooming?: A Lot Help from another person toileting, which includes using toliet, bedpan, or urinal?: A Lot Help from another person bathing (including washing, rinsing, drying)?: A Lot Help from another person to put on and taking off regular upper body clothing?: A Lot Help from another person to put on and taking off regular lower body clothing?: A Lot 6 Click Score: 12    End of Session    OT Visit Diagnosis: Unsteadiness on feet (R26.81);Muscle weakness (generalized) (M62.81);Low vision, both eyes (H54.2);Other symptoms and signs involving cognitive function;Cognitive communication deficit (R41.841);Hemiplegia and hemiparesis Symptoms and signs involving cognitive functions: Nontraumatic intracerebral hemorrhage Hemiplegia - Right/Left:  Left Hemiplegia - caused by: Nontraumatic intracerebral hemorrhage   Activity Tolerance Patient limited by pain   Patient Left in bed;with call bell/phone within reach;with bed alarm set;with family/visitor present   Nurse Communication Mobility status        Time: 1610-9604 OT Time Calculation (min): 33 min  Charges: OT General Charges $OT Visit: 1 Visit OT Treatments $Therapeutic Activity: 8-22 mins $Neuromuscular Re-education: 8-22 mins  12/28/2023  AB, OTR/L  Acute Rehabilitation Services  Office: [phone]   Jorene New 12/28/2023, 6:32 PM

## 2023-12-29 ENCOUNTER — Encounter (HOSPITAL_COMMUNITY): Payer: Self-pay | Admitting: Neurology

## 2023-12-29 DIAGNOSIS — I629 Nontraumatic intracranial hemorrhage, unspecified: Secondary | ICD-10-CM | POA: Diagnosis not present

## 2023-12-29 LAB — CBC
HCT: 39.8 % (ref 39.0–52.0)
Hemoglobin: 12.7 g/dL — ABNORMAL LOW (ref 13.0–17.0)
MCH: 30.5 pg (ref 26.0–34.0)
MCHC: 31.9 g/dL (ref 30.0–36.0)
MCV: 95.7 fL (ref 80.0–100.0)
Platelets: 261 10*3/uL (ref 150–400)
RBC: 4.16 MIL/uL — ABNORMAL LOW (ref 4.22–5.81)
RDW: 13.8 % (ref 11.5–15.5)
WBC: 7.7 10*3/uL (ref 4.0–10.5)
nRBC: 0 % (ref 0.0–0.2)

## 2023-12-29 LAB — COMPREHENSIVE METABOLIC PANEL WITH GFR
ALT: 340 U/L — ABNORMAL HIGH (ref 0–44)
AST: 148 U/L — ABNORMAL HIGH (ref 15–41)
Albumin: 2.9 g/dL — ABNORMAL LOW (ref 3.5–5.0)
Alkaline Phosphatase: 96 U/L (ref 38–126)
Anion gap: 7 (ref 5–15)
BUN: 15 mg/dL (ref 6–20)
CO2: 25 mmol/L (ref 22–32)
Calcium: 9.3 mg/dL (ref 8.9–10.3)
Chloride: 107 mmol/L (ref 98–111)
Creatinine, Ser: 0.67 mg/dL (ref 0.61–1.24)
GFR, Estimated: >60 mL/min (ref >60)
Glucose, Bld: 100 mg/dL — ABNORMAL HIGH (ref 70–99)
Potassium: 4 mmol/L (ref 3.5–5.1)
Sodium: 139 mmol/L (ref 135–145)
Total Bilirubin: 0.6 mg/dL (ref 0.0–1.2)
Total Protein: 7.6 g/dL (ref 6.5–8.1)

## 2023-12-29 MED ORDER — TRAMADOL HCL 50 MG PO TABS
50.0000 mg | ORAL_TABLET | Freq: Once | ORAL | Status: AC
  Administered 2023-12-29: 50 mg via ORAL
  Filled 2023-12-29: qty 1

## 2023-12-29 NOTE — H&P (Shared)
 Physical Medicine and Rehabilitation Admission H&P    Chief Complaint  Patient presents with   Functional deficits due to stroke.    HPI: Troy Bishop is a 33 year old male with history of  uncontrolled HTN, thrombocytopenia, cirrhosis of liver due to ETOH abuse (Dr. [name]), ETOH withdrawal seizures who was admitted on 12/16/23 with acute onset of left sided weakness with lethargy. UDS showed ETOH level 264He was found to have acute large right cerebral hematoma with active extravasation/spot sign and no LVO or emergent finding. Repeat CT head with increase in size of hemorrhage and was transfused with 2 units FFP by neurology. Dr. [name] consulted and recommended follow up CT head for monitoring. Tachycardia with agitation due to alcohol withdrawal treated with phenobarb taper.   He was started on hypertonic saline, required Cleviprex  for BP control and precedex  due to ongoing agitation. Serial CT head showed stable bleed. MRI brain showed stable hematoma centered in right basal ganglia extending into anterior right temporal Troy Bishop with extensive edema and 5 mm leftward shift and no abnormal contrast enhancement.  Dr. [name] felt that bleed likely hypertensive in nature.   Medications added for blood pressure control and cleveprex has been weaned off. Thrombocytopenia has been stable.  BLE dopplers  negative for DVT. Abnormal LFTs have been monitored and remain elevated.  He has had issues with left hip pain and X rays done negative for fracture or contusion. LUE ultrasound done due to pain and redness and showed evidence of superficial vein thrombosis left cephalic vein. Low grade fevers have resolved. Mentation and activity tolerance have improved. He requires +2 mod to max assist for standing with max cues for posture and max to total assist with cues for ADLs. He was independent PTA and CIR recommended due to functional decline.    ROS   Past Medical History:  Diagnosis Date   Liver disease     Painful orthopaedic hardware (HCC) 07/2017   right tibia    Past Surgical History:  Procedure Laterality Date   BREATH TEK H PYLORI N/A 09/08/2016   Procedure: BREATH TEK [name];  Surgeon: [name], MD;  Location: [facility];  Service: Gastroenterology;  Laterality: N/A;   HARDWARE REMOVAL Left 07/27/2017   Procedure: Removal of deep implants right proximal and distal tibia;  Surgeon: Amada Backer, MD;  Location: [facility];  Service: Orthopedics;  Laterality: Left;   TENDON REPAIR Left 08/14/2014   Procedure: LEFT HAND WOUND EXPLORATION AND TENDON REPAIR;  Surgeon: Shellie Dials, MD;  Location: [facility];  Service: Orthopedics;  Laterality: Left;   TIBIA IM NAIL INSERTION  07/31/2012   Procedure: INTRAMEDULLARY (IM) NAIL TIBIAL;  Surgeon: Amada Backer, MD;  Location: [facility];  Service: Orthopedics;  Laterality: Left;   UPPER GI ENDOSCOPY  07/25/2016    Family History  Problem Relation Age of Onset   Esophageal cancer Neg Hx    Liver disease Neg Hx    Colon cancer Neg Hx     Social History:  Married reports that he quit smoking about 9 years ago. His smoking use included cigarettes. He has never used smokeless tobacco. He reports current alcohol use. He reports that he does not use drugs.   Allergies: No Known Allergies   Medications Prior to Admission  Medication Sig Dispense Refill   risankizumab-rzaa (SKYRIZI PEN) 150 MG/ML pen Inject 150 mg as directed every 3 (three) months.      Home:  Home Living Family/patient expects to be discharged to:: Private residence Living Arrangements: Spouse/significant other, Children (children are 21, 75 , 56 and 54 year old.) Available Help at Discharge: Available 24 hours/day (wife does not work) Type of Home: House Home Access: Level entry Home Layout: One level Bathroom Shower/Tub: Tub/shower unit, Engineer, building services: Standard Bathroom Accessibility: Yes Additional Comments: verified with  patient and wife on 5/22  Lives With: Spouse, Family   Functional History: Prior Function Prior Level of Function : Independent/Modified Independent Mobility Comments: 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 During efforts to get to EOB but became very restless from L hip pain and could not tolerate upright sitting. Attempted to sit pt upright x2 using different approaches but he would repel himself back into a supine position on the bed at make himself a risk to slide forward. Pt needing consistent cueing and redirection for his safety, deferred further EOB attempts for pt safety. Transfers Overall transfer level: Needs assistance Equipment used: 2 person hand held assist Transfers: Sit to/from Stand Sit to Stand: +2 physical assistance, Min assist General transfer comment: Pt able to stand x3 with +2 Min A. Pt noted to lean to the L when standing and requiring max cues for upright posture and to find midline in the mirror. Ambulation/Gait General Gait Details: unable    ADL: ADL Overall ADL's : Needs assistance/impaired Eating/Feeding: NPO Grooming: Moderate assistance, Maximal assistance, Sitting Grooming Details (indicate cue type and reason): cues for receiving grooming items Upper Body Bathing: Set up Upper Body Bathing Details (indicate cue type and reason): washes L arm with RUE Lower Body Bathing: Total assistance, +2 for physical assistance, +2 for safety/equipment Upper Body Dressing : Maximal assistance, Sitting Lower Body Dressing: Maximal assistance Lower Body Dressing Details  (indicate cue type and reason): to don socks using figure 4 from bed level. Cues for problem solving, more reliant on OT to place LLE Toilet Transfer: Maximal assistance, +2 for physical assistance, +2 for safety/equipment Toilet Transfer Details (indicate cue type and reason): stedy Functional mobility during ADLs: +2 for physical assistance, Total assistance General ADL Comments: Pt not tolerable to EOB sitting to progress with ADLs  Cognition: Cognition Overall Cognitive Status: Impaired/Different from baseline Orientation Level: (P) Oriented to person, Oriented to place, Oriented to time Year: 2025 Month: May Day of Week: Correct Attention: Sustained Sustained Attention: Impaired Sustained Attention Impairment: Verbal basic, Functional basic Awareness: Impaired Awareness Impairment: Intellectual impairment Problem Solving: Impaired Problem Solving Impairment: Functional basic Executive Function: Self Monitoring Self Monitoring: Impaired Self Monitoring Impairment: Functional basic Behaviors: Restless, Impulsive, Perseveration Cognition Arousal: Alert Behavior During Therapy: Restless Overall Cognitive Status: Impaired/Different from baseline   Blood pressure 138/83, pulse 79, temperature 98.9 F (37.2 C), temperature source Oral, resp. rate 15, weight 61.5 kg, SpO2 99%. Physical Exam  Results for orders placed or performed during the hospital encounter of 12/16/23 (from the past 48 hours)  Comprehensive metabolic panel with GFR     Status: Abnormal   Collection Time: 12/28/23  5:01 AM  Result Value Ref Range   Sodium 139 135 - 145 mmol/L  Potassium 3.9 3.5 - 5.1 mmol/L   Chloride 106 98 - 111 mmol/L   CO2 22 22 - 32 mmol/L   Glucose, Bld 108 (H) 70 - 99 mg/dL    Comment: Glucose reference range applies only to samples taken after fasting for at least 8 hours.   BUN 15 6 - 20 mg/dL   Creatinine, Ser 1.61 0.61 - 1.24 mg/dL   Calcium  9.4 8.9 - 10.3 mg/dL   Total  Protein 7.6 6.5 - 8.1 g/dL   Albumin 3.1 (L) 3.5 - 5.0 g/dL   AST 096 (H) 15 - 41 U/L   ALT 404 (H) 0 - 44 U/L   Alkaline Phosphatase 89 38 - 126 U/L   Total Bilirubin 0.7 0.0 - 1.2 mg/dL   GFR, Estimated >04 >54 mL/min    Comment: (NOTE) Calculated using the CKD-EPI Creatinine Equation (2021)    Anion gap 11 5 - 15    Comment: Performed at [facility], [address] [place], [place] [address]  CBC     Status: Abnormal   Collection Time: 12/28/23  5:01 AM  Result Value Ref Range   WBC 7.7 4.0 - 10.5 K/uL   RBC 4.21 (L) 4.22 - 5.81 MIL/uL   Hemoglobin 12.8 (L) 13.0 - 17.0 g/dL   HCT 91.4 78.2 - 95.6 %   MCV 93.3 80.0 - 100.0 fL   MCH 30.4 26.0 - 34.0 pg   MCHC 32.6 30.0 - 36.0 g/dL   RDW 21.3 08.6 - 57.8 %   Platelets 268 150 - 400 K/uL    Comment: REPEATED TO VERIFY   nRBC 0.0 0.0 - 0.2 %    Comment: Performed at [facility], [address], [place], [place] [address]  Comprehensive metabolic panel with GFR     Status: Abnormal   Collection Time: 12/29/23  6:42 AM  Result Value Ref Range   Sodium 139 135 - 145 mmol/L   Potassium 4.0 3.5 - 5.1 mmol/L   Chloride 107 98 - 111 mmol/L   CO2 25 22 - 32 mmol/L   Glucose, Bld 100 (H) 70 - 99 mg/dL    Comment: Glucose reference range applies only to samples taken after fasting for at least 8 hours.   BUN 15 6 - 20 mg/dL   Creatinine, Ser 9.52 0.61 - 1.24 mg/dL   Calcium  9.3 8.9 - 10.3 mg/dL   Total Protein 7.6 6.5 - 8.1 g/dL   Albumin 2.9 (L) 3.5 - 5.0 g/dL   AST 841 (H) 15 - 41 U/L   ALT 340 (H) 0 - 44 U/L   Alkaline Phosphatase 96 38 - 126 U/L   Total Bilirubin 0.6 0.0 - 1.2 mg/dL   GFR, Estimated >32 >44 mL/min    Comment: (NOTE) Calculated using the CKD-EPI Creatinine Equation (2021)    Anion gap 7 5 - 15    Comment: Performed at [facility], [address], [place], [place] [address]  CBC     Status: Abnormal   Collection Time: 12/29/23  6:42 AM  Result Value Ref Range   WBC 7.7 4.0 - 10.5 K/uL    RBC 4.16 (L) 4.22 - 5.81 MIL/uL   Hemoglobin 12.7 (L) 13.0 - 17.0 g/dL   HCT 25.3 66.4 - 40.3 %   MCV 95.7 80.0 - 100.0 fL   MCH 30.5 26.0 - 34.0 pg   MCHC 31.9 30.0 - 36.0 g/dL   RDW 47.4 25.9 - 56.3 %  Platelets 261 150 - 400 K/uL    Comment: REPEATED TO VERIFY   nRBC 0.0 0.0 - 0.2 %    Comment: Performed at [facility], [address] [place], [place] [address]   DG HIP UNILAT WITH PELVIS 2-3 VIEWS LEFT Result Date: 12/28/2023 CLINICAL DATA:  221992 Left hip pain 221992 EXAM: DG HIP (WITH OR WITHOUT PELVIS) 2-3V LEFT COMPARISON:  Dec 17, 2023 FINDINGS: No acute fracture or dislocation. There is no evidence of arthropathy or other focal bone abnormality. Soft tissues are unremarkable. IMPRESSION: No acute fracture or dislocation. Electronically Signed   By: Rance Burrows M.D.   On: 12/28/2023 11:03      Blood pressure 138/83, pulse 79, temperature 98.9 F (37.2 C), temperature source Oral, resp. rate 15, weight 61.5 kg, SpO2 99%.  Medical Problem List and Plan: 1. Functional deficits secondary to ***  -patient may *** shower  -ELOS/Goals: *** 2.  Antithrombotics: -DVT/anticoagulation:  Pharmaceutical: Lovenox   -antiplatelet therapy: N/A 3. Pain Management: Tylenol  prn.  4. Mood/Behavior/Sleep: LCSW to follow for evaluation and support.   -antipsychotic agents: N/A 5. Neuropsych/cognition: This patient *** capable of making decisions on *** own behalf. 6. Skin/Wound Care: Routine pressure relief measures.  7. Fluids/Electrolytes/Nutrition: Monitor I/O. Check CMET in am.  8. Metabolic encephalopathy/ETOH withdrawal: Has resolved 9. Abnormal LFTs:  AST trending down. Continue to monitor 10. ABLA?: Recheck CBC in am.     ***  Zelda Hickman, PA-C 12/29/2023

## 2023-12-29 NOTE — Progress Notes (Signed)
 Inpatient Rehabilitation Admissions Coordinator   I have CIR bed to admit him to on Saturday. Dr Dorn Gaskins will see him in the am and I will arrange the admit. [address] RN can call CIR at 12 noon on Saturday at [phone] to give report and verify when room will be available to admit. Acute team and TOC made aware.  Jeannetta Millman, RN, MSN Rehab Admissions Coordinator [phone] 12/29/2023 9:51 AM

## 2023-12-29 NOTE — TOC Progression Note (Signed)
 Transition of Care [facility]) - Progression Note    Patient Details  Name: Troy Bishop MRN: [id number] Date of Birth: 04/02/1991  Transition of Care [facility] CM/SW Contact  Jonathan Neighbor, RN Phone Number: 12/29/2023, 9:50 AM  Clinical Narrative:     Plan is for patient to admit to CIR tomorrow.  TOC following.  Expected Discharge Plan: IP Rehab Facility    Expected Discharge Plan and Services                                               Social Determinants of Health (SDOH) Interventions SDOH Screenings   Food Insecurity: Patient Unable To Answer (12/16/2023)  Housing: Patient Unable To Answer (12/16/2023)  Transportation Needs: Patient Unable To Answer (12/16/2023)  Utilities: Patient Unable To Answer (12/16/2023)  Tobacco Use: Medium Risk (11/12/2023)    Readmission Risk Interventions     No data to display

## 2023-12-29 NOTE — Progress Notes (Signed)
 Physical Therapy Treatment Patient Details Name: Troy Bishop MRN: [id number] DOB: 1990-09-20 Today's Date: 12/29/2023   History of Present Illness Pt is a 33 y.o. male presenting 5/10 after sudden onset L weakness and collapse. CTH with large L subcortical hemorrhage, repeat CT 1 hour later showing progression of ICH from 35mL to 52mL with small volume hemorrhage within the  right lateral ventricle, consistent with intraventricular extension. PMH significant of uncontrolled HTN, cirrhosis due to alcohol abuse, thrombocytopenia    PT Comments  Pt tolerated treatment well today. Pt with similar presentation to previous session Pt able to stand x2 today with +2 Min A via HHA with emphasis on standing weight shifts however pt limited by L knee buckling. No change in DC/DME recs at this time. PT will continue to follow.     If plan is discharge home, recommend the following: Two people to help with walking and/or transfers;Two people to help with bathing/dressing/bathroom   Can travel by private vehicle        Equipment Recommendations  Other (comment) (Per accepting facility)    Recommendations for Other Services       Precautions / Restrictions Precautions Precautions: Fall;Other (comment) Recall of Precautions/Restrictions: Impaired Precaution/Restrictions Comments: L hemi, neglect Restrictions Weight Bearing Restrictions Per Provider Order: No     Mobility  Bed Mobility Overal bed mobility: Needs Assistance Bed Mobility: Supine to Sit, Sit to Supine, Rolling Rolling: Min assist   Supine to sit: +2 for physical assistance, Mod assist Sit to supine: +2 for physical assistance, Max assist   General bed mobility comments: Assist for L sided management, cues for initiation. L Lateral lean and anterior lean when seated EOB.    Transfers Overall transfer level: Needs assistance Equipment used: 2 person hand held assist Transfers: Sit to/from Stand Sit to Stand: +2 physical  assistance, Min assist           General transfer comment: Pt able to stand x2 with +2 Min A. Pt noted to lean to the L when standing and requiring max cues for upright posture and to find midline in the mirror.    Ambulation/Gait               General Gait Details: unable   Stairs             Wheelchair Mobility     Tilt Bed    Modified Rankin (Stroke Patients Only) Modified Rankin (Stroke Patients Only) Pre-Morbid Rankin Score: No symptoms Modified Rankin: Severe disability     Balance Overall balance assessment: Needs assistance Sitting-balance support: Feet supported Sitting balance-Leahy Scale: Poor Sitting balance - Comments: L lateral and anterior lean. Min to Mod A.   Standing balance support: Bilateral upper extremity supported Standing balance-Leahy Scale: Zero Standing balance comment: reliant on therapists                            Communication Communication Communication: Impaired (Trialed interpreter today however pt would respond in english.) Factors Affecting Communication: Reduced clarity of speech  Cognition Arousal: Alert Behavior During Therapy: Flat affect, Restless   PT - Cognitive impairments: Difficult to assess Difficult to assess due to: Impaired communication                     PT - Cognition Comments: Difficult to understand speech, follows commands intermittently. Following commands: Impaired Following commands impaired: Follows one step commands inconsistently    Cueing Cueing Techniques:  Verbal cues, Gestural cues, Tactile cues, Visual cues  Exercises      General Comments General comments (skin integrity, edema, etc.): VSS      Pertinent Vitals/Pain Pain Assessment Pain Assessment: Faces Faces Pain Scale: Hurts little more Pain Location: L hip (?) pt frequently stating "here" Pain Descriptors / Indicators: Discomfort, Moaning, Grimacing Pain Intervention(s): Monitored during session,  Limited activity within patient's tolerance, Premedicated before session, Repositioned    Home Living                          Prior Function            PT Goals (current goals can now be found in the care plan section) Progress towards PT goals: Progressing toward goals    Frequency    Min 3X/week      PT Plan      Co-evaluation              AM-PAC PT "6 Clicks" Mobility   Outcome Measure  Help needed turning from your back to your side while in a flat bed without using bedrails?: A Lot Help needed moving from lying on your back to sitting on the side of a flat bed without using bedrails?: Total Help needed moving to and from a bed to a chair (including a wheelchair)?: Total Help needed standing up from a chair using your arms (e.g., wheelchair or bedside chair)?: A Lot Help needed to walk in hospital room?: Total Help needed climbing 3-5 steps with a railing? : Total 6 Click Score: 8    End of Session Equipment Utilized During Treatment: Gait belt Activity Tolerance: Patient tolerated treatment well Patient left: in bed;with call bell/phone within reach;with bed alarm set;with family/visitor present Nurse Communication: Mobility status;Need for lift equipment PT Visit Diagnosis: Hemiplegia and hemiparesis;Unsteadiness on feet (R26.81) Hemiplegia - Right/Left: Left Hemiplegia - dominant/non-dominant: Non-dominant Hemiplegia - caused by: Nontraumatic intracerebral hemorrhage     Time: 1356-1416 PT Time Calculation (min) (ACUTE ONLY): 20 min  Charges:    $Therapeutic Activity: 8-22 mins PT General Charges $$ ACUTE PT VISIT: 1 Visit                     Rodgers Clack, PT, DPT Acute Rehab Services [phone]    Montrey Buist 12/29/2023, 5:10 PM

## 2023-12-29 NOTE — Progress Notes (Signed)
 PROGRESS NOTE Micky Sheller  ZOX:[id number] DOB: 07-Sep-1990 DOA: 12/16/2023 PCP: Pcp, No  Brief Narrative/Hospital Course: 33 yo male with past medical history of hypertension, seizure and daily alcohol use admitted for acute onset left-sided weakness. He was found to have a large right subcortical ICH. admitted on 5/10. Repeat Ct head with enlargement of ICH and he received PLT transfusion for Plt 54 . was on clevipres has been off for multiple days and started on Po Meds. On CIWA protocol for withdrawal. core track tube placed with TF infusing. Elevated liver enzymes, fevers which are likely central. Therapy recommending CIR   Subjective: Seen and examined Wife at the bedside Overnight afebrile vital stable LFTs slowly improving He is waiting for CIR Complains of pain in the left hip  Assessment and plan:  Right basal ganglia large ICH Acute metabolic encephalopathy: Likely hypertensive ICH. Patient admitted under code stroke complaints CT heads CTA head and neck as below Code Stroke CT head 35 mm ICH in right frontotemporal region CTA head & neck focus of contrast enhancement within bed of right cerebral hematoma, no other underlying vascular abnormality Repeat CT head> increase in size of right cerebral hemorrhage, estimated volume 52 mL Repeat CT head 5/10 unchanged right cerebral hematoma with 6 mm of midline shift Repeat CT head 5/16 stable MRI stable hematoma and MLS, no abnormal enhancement  Stroke workup completed :TTE normal EF, LDL 124 A1c 5.6 No antithrombotic prior to admission,  No antithrombotic secondary to ICH Continue Seroquel  50 twice daily, as needed sedatives Continue PT OT and extensive rehabilitation awaiting for CIR patient and family opposed to SNF .  Bed available to admit to CIR tomorrow   Cerebral edema: Due to Large right ICH with 6 mm midline shift noted on CT 5/10 and 5/11: S/P 3% hypertonic saline In ICU. MRI stable  Left upper extremity superior  thrombophlebitis: Continue supportive care  Left hip pain: Will obtain x-ray, did not show any abnormality start gabapentin twice daily-watch for sedation.  Still complains of pain and states Tylenol  not helping.  Had one-time dose of tramadol .  Transient fever, likely central fever: CXR negative UA negative,monitor.  No recurrence of fever.  Chest x-ray repeat has been unremarkable on 5/20  ? Fever from thrombophlebitis . If febrile again consider oral antibiotics   Hypertension PTA not on meds, In ICU needed Cleviprex  drip. BP well-controlled on current regimen with amlodipine  10 Coreg  25 twice daily, losartan  100. Goal sbp<160   Hyperlipidemia: LDL 124, goal < 70.  Not on statin start once LFT improves also Treated  Hypokalemia: Resolved.  Alcohol withdrawal W/ alcohol abuse drinking 2-3/d EtOH level on admission 264. Initially on Precedex  on CIWA protocol Ativan .  At this time overall stable continue Ativan  as needed, Seroquel  ETOH abuse cessation has been discussed    Thrombocytopenia: Likely from underlying liver dysfunction/alcohol abuse, rs/p platelet transfusion x 1. Now  Improved  Elevated LFTs: Transaminitis likely from alcohol abuse -LFTs very slow to improve.  Continue to monitor intermittently.   Recent Labs  Lab 12/22/23 0631 12/23/23 0732 12/24/23 8119 12/25/23 0625 12/26/23 0631 12/27/23 0519 12/28/23 0501  AST 160*   < > 321* 235* 185* 241* 212*  ALT 179*   < > 355* 372* 341* 429* 404*  ALKPHOS 65   < > 82 93 92 100 89  BILITOT 1.6*   < > 1.6* 1.2 0.9 0.9 0.7  PROT 7.2   < > 7.5 7.5 7.4 7.5 7.6  ALBUMIN 2.9*   < >  3.0* 2.9* 3.0* 3.1* 3.1*  PLT 70*  --   --  189 212 238 268   < > = values in this interval not displayed.   Dysphagia: Cont DYS diet per SLP-was needing core track tube feeding in ICU 5/16.  Doing well with po intake.  DVT prophylaxis: heparin  injection 5,000 Units Start: 12/18/23 2215 SCD's Start: 12/16/23 0115 Code Status:   Code  Status: Full Code Family Communication: plan of care discussed with patient/ wife at bedside. Patient status is: Remains hospitalized because of severity of illness Level of care: Telemetry Medical   Dispo: The patient is from: home            Anticipated disposition:awaiting CIR  Objective: Vitals last 24 hrs: Vitals:   12/28/23 2014 12/28/23 2321 12/29/23 0414 12/29/23 0737  BP: 133/87 112/67 (!) 103/57 138/83  Pulse: 97 96 68 79  Resp:    15  Temp: 98.9 F (37.2 C) 98.3 F (36.8 C) 98.1 F (36.7 C) 98.9 F (37.2 C)  TempSrc: Oral Oral Axillary Oral  SpO2: 98% 97% 99% 99%  Weight:        Physical Examination: General exam: alert awake, oriented at baseline, older than stated age HEENT:Oral mucosa moist, Ear/Nose WNL grossly Respiratory system: Bilaterally clear BS,no use of accessory muscle Cardiovascular system: S1 & S2 +, No JVD. Gastrointestinal system: Abdomen soft,NT,ND, BS+ Nervous System: Alert, awake, in place in the left upper extremity able to move left lower extremity some Extremities: LE edema neg,distal peripheral pulses palpable and warm.  Skin: No rashes,no icterus. MSK: Normal muscle bulk,tone, power   Data Reviewed: I have personally reviewed following labs and imaging studies ( see epic result tab) CBC: Recent Labs  Lab 12/25/23 0625 12/26/23 0631 12/27/23 0519 12/28/23 0501 12/29/23 0642  WBC 6.1 5.9 8.7 7.7 7.7  HGB 12.2* 12.2* 13.1 12.8* 12.7*  HCT 37.6* 38.2* 39.1 39.3 39.8  MCV 94.0 95.3 94.0 93.3 95.7  PLT 189 212 238 268 261   CMP: Recent Labs  Lab 12/23/23 0732 12/24/23 0849 12/25/23 0625 12/26/23 0631 12/27/23 0519 12/28/23 0501 12/29/23 0642  NA 135 135 136 140 137 139 139  K 3.3* 3.8 3.4* 3.9 4.1 3.9 4.0  CL 104 105 106 110 108 106 107  CO2 20* 19* 20* 21* 19* 22 25  GLUCOSE 96 96 95 98 107* 108* 100*  BUN 15 12 16 16 15 15 15   CREATININE 0.61 0.71 0.68 0.70 0.67 0.64 0.67  CALCIUM  8.8* 8.9 9.0 9.0 9.1 9.4 9.3  MG 2.2  2.1 2.2 2.3  --   --   --   PHOS 3.6 3.4 4.0 4.0  --   --   --    GFR: CrCl cannot be calculated (Unknown ideal weight.). Recent Labs  Lab 12/25/23 0625 12/26/23 0631 12/27/23 0519 12/28/23 0501 12/29/23 0642  AST 235* 185* 241* 212* 148*  ALT 372* 341* 429* 404* 340*  ALKPHOS 93 92 100 89 96  BILITOT 1.2 0.9 0.9 0.7 0.6  PROT 7.5 7.4 7.5 7.6 7.6  ALBUMIN 2.9* 3.0* 3.1* 3.1* 2.9*   No results for input(s): "LIPASE", "AMYLASE" in the last 168 hours.  No results for input(s): "AMMONIA" in the last 168 hours.  Coagulation Profile:  No results for input(s): "INR", "PROTIME" in the last 168 hours.  Unresulted Labs (From admission, onward)    None      Antimicrobials/Microbiology: Anti-infectives (From admission, onward)    None  Component Value Date/Time   SDES URINE, CATHETERIZED 07/31/2012 1953   SPECREQUEST NONE 07/31/2012 1953   CULT NO GROWTH 07/31/2012 1953   REPTSTATUS 08/02/2012 FINAL 07/31/2012 1953    Procedures:  Medications reviewed:  Scheduled Meds:  amLODipine   10 mg Oral Daily   carvedilol   25 mg Oral BID WC   feeding supplement  237 mL Oral BID BM   folic acid   1 mg Oral Daily   gabapentin  200 mg Oral BID   heparin  injection (subcutaneous)  5,000 Units Subcutaneous Q8H   lactulose   30 g Oral BID   losartan   100 mg Oral Daily   multivitamin with minerals  1 tablet Oral Daily   nicotine   21 mg Transdermal Daily   ondansetron  (ZOFRAN ) IV  4 mg Intravenous Once   mouth rinse  15 mL Mouth Rinse 4 times per day   pantoprazole   40 mg Oral Daily   polyethylene glycol  17 g Oral Daily   QUEtiapine   50 mg Oral BID   sodium chloride  flush  3 mL Intravenous Once   thiamine   100 mg Oral Daily   traMADol   50 mg Oral Once   Continuous Infusions:  Edwena Graham, MD [facility] 12/29/2023, 12:16 PM

## 2023-12-29 NOTE — Plan of Care (Signed)
  Problem: Education: Goal: Knowledge of disease or condition will improve Outcome: Progressing Goal: Knowledge of secondary prevention will improve (MUST DOCUMENT ALL) Outcome: Progressing Goal: Knowledge of patient specific risk factors will improve (DELETE if not current risk factor) Outcome: Progressing   Problem: Intracerebral Hemorrhage Tissue Perfusion: Goal: Complications of Intracerebral Hemorrhage will be minimized Outcome: Progressing   Problem: Coping: Goal: Will verbalize positive feelings about self Outcome: Progressing Goal: Will identify appropriate support needs Outcome: Progressing   Problem: Health Behavior/Discharge Planning: Goal: Ability to manage health-related needs will improve Outcome: Progressing Goal: Goals will be collaboratively established with patient/family Outcome: Progressing   Problem: Self-Care: Goal: Ability to participate in self-care as condition permits will improve Outcome: Progressing Goal: Verbalization of feelings and concerns over difficulty with self-care will improve Outcome: Progressing Goal: Ability to communicate needs accurately will improve Outcome: Progressing   Problem: Nutrition: Goal: Risk of aspiration will decrease Outcome: Progressing Goal: Dietary intake will improve Outcome: Progressing   Problem: Education: Goal: Knowledge of General Education information will improve Description: Including pain rating scale, medication(s)/side effects and non-pharmacologic comfort measures Outcome: Progressing   Problem: Health Behavior/Discharge Planning: Goal: Ability to manage health-related needs will improve Outcome: Progressing   Problem: Clinical Measurements: Goal: Ability to maintain clinical measurements within normal limits will improve Outcome: Progressing Goal: Will remain free from infection Outcome: Progressing Goal: Diagnostic test results will improve Outcome: Progressing Goal: Respiratory  complications will improve Outcome: Progressing Goal: Cardiovascular complication will be avoided Outcome: Progressing   Problem: Activity: Goal: Risk for activity intolerance will decrease Outcome: Progressing   Problem: Nutrition: Goal: Adequate nutrition will be maintained Outcome: Progressing   Problem: Coping: Goal: Level of anxiety will decrease Outcome: Progressing   Problem: Elimination: Goal: Will not experience complications related to bowel motility Outcome: Progressing Goal: Will not experience complications related to urinary retention Outcome: Progressing   Problem: Pain Managment: Goal: General experience of comfort will improve and/or be controlled Outcome: Progressing   Problem: Safety: Goal: Ability to remain free from injury will improve Outcome: Progressing   Problem: Skin Integrity: Goal: Risk for impaired skin integrity will decrease Outcome: Progressing   Problem: Safety: Goal: Non-violent Restraint(s) Outcome: Progressing

## 2023-12-29 NOTE — Plan of Care (Signed)
  Problem: Education: Goal: Knowledge of disease or condition will improve 12/29/2023 1606 by Onesimo Bijou, RN Outcome: Progressing 12/29/2023 1606 by Onesimo Bijou, RN Outcome: Not Progressing

## 2023-12-30 ENCOUNTER — Inpatient Hospital Stay (HOSPITAL_COMMUNITY)
Admission: AD | Admit: 2023-12-30 | Discharge: 2024-01-30 | DRG: 057 | Disposition: A | Discharge status: Home / Self Care | Source: Intra-hospital | Attending: Physical Medicine & Rehabilitation | Admitting: Physical Medicine & Rehabilitation

## 2023-12-30 ENCOUNTER — Encounter (HOSPITAL_COMMUNITY): Payer: Self-pay | Admitting: Physical Medicine & Rehabilitation

## 2023-12-30 ENCOUNTER — Other Ambulatory Visit: Payer: Self-pay

## 2023-12-30 DIAGNOSIS — E785 Hyperlipidemia, unspecified: Secondary | ICD-10-CM | POA: Diagnosis present

## 2023-12-30 DIAGNOSIS — I69322 Dysarthria following cerebral infarction: Secondary | ICD-10-CM | POA: Diagnosis not present

## 2023-12-30 DIAGNOSIS — F1010 Alcohol abuse, uncomplicated: Secondary | ICD-10-CM | POA: Diagnosis present

## 2023-12-30 DIAGNOSIS — I619 Nontraumatic intracerebral hemorrhage, unspecified: Secondary | ICD-10-CM | POA: Diagnosis not present

## 2023-12-30 DIAGNOSIS — I69311 Memory deficit following cerebral infarction: Secondary | ICD-10-CM | POA: Diagnosis not present

## 2023-12-30 DIAGNOSIS — D696 Thrombocytopenia, unspecified: Secondary | ICD-10-CM | POA: Diagnosis present

## 2023-12-30 DIAGNOSIS — I639 Cerebral infarction, unspecified: Secondary | ICD-10-CM

## 2023-12-30 DIAGNOSIS — G8194 Hemiplegia, unspecified affecting left nondominant side: Secondary | ICD-10-CM | POA: Diagnosis not present

## 2023-12-30 DIAGNOSIS — R1310 Dysphagia, unspecified: Secondary | ICD-10-CM | POA: Diagnosis present

## 2023-12-30 DIAGNOSIS — I10 Essential (primary) hypertension: Secondary | ICD-10-CM | POA: Diagnosis present

## 2023-12-30 DIAGNOSIS — Z79899 Other long term (current) drug therapy: Secondary | ICD-10-CM

## 2023-12-30 DIAGNOSIS — K5900 Constipation, unspecified: Secondary | ICD-10-CM | POA: Diagnosis not present

## 2023-12-30 DIAGNOSIS — I69390 Apraxia following cerebral infarction: Secondary | ICD-10-CM | POA: Diagnosis not present

## 2023-12-30 DIAGNOSIS — Y908 Blood alcohol level of 240 mg/100 ml or more: Secondary | ICD-10-CM | POA: Diagnosis present

## 2023-12-30 DIAGNOSIS — Z87820 Personal history of traumatic brain injury: Secondary | ICD-10-CM

## 2023-12-30 DIAGNOSIS — E876 Hypokalemia: Secondary | ICD-10-CM | POA: Diagnosis present

## 2023-12-30 DIAGNOSIS — I69354 Hemiplegia and hemiparesis following cerebral infarction affecting left non-dominant side: Secondary | ICD-10-CM | POA: Diagnosis present

## 2023-12-30 DIAGNOSIS — R7401 Elevation of levels of liver transaminase levels: Secondary | ICD-10-CM | POA: Diagnosis not present

## 2023-12-30 DIAGNOSIS — K7030 Alcoholic cirrhosis of liver without ascites: Secondary | ICD-10-CM | POA: Diagnosis present

## 2023-12-30 DIAGNOSIS — I6931 Attention and concentration deficit following cerebral infarction: Secondary | ICD-10-CM | POA: Diagnosis not present

## 2023-12-30 DIAGNOSIS — I69391 Dysphagia following cerebral infarction: Secondary | ICD-10-CM

## 2023-12-30 DIAGNOSIS — R7989 Other specified abnormal findings of blood chemistry: Secondary | ICD-10-CM | POA: Diagnosis not present

## 2023-12-30 DIAGNOSIS — D62 Acute posthemorrhagic anemia: Secondary | ICD-10-CM | POA: Diagnosis present

## 2023-12-30 DIAGNOSIS — I629 Nontraumatic intracranial hemorrhage, unspecified: Secondary | ICD-10-CM | POA: Diagnosis not present

## 2023-12-30 DIAGNOSIS — M25552 Pain in left hip: Secondary | ICD-10-CM | POA: Diagnosis not present

## 2023-12-30 DIAGNOSIS — F411 Generalized anxiety disorder: Secondary | ICD-10-CM | POA: Diagnosis present

## 2023-12-30 DIAGNOSIS — K5901 Slow transit constipation: Secondary | ICD-10-CM | POA: Diagnosis not present

## 2023-12-30 DIAGNOSIS — I610 Nontraumatic intracerebral hemorrhage in hemisphere, subcortical: Secondary | ICD-10-CM | POA: Diagnosis not present

## 2023-12-30 DIAGNOSIS — R252 Cramp and spasm: Secondary | ICD-10-CM | POA: Diagnosis not present

## 2023-12-30 MED ORDER — ORAL CARE MOUTH RINSE: 15.0000 mL | OROMUCOSAL | Status: DC | PRN

## 2023-12-30 MED ORDER — LABETALOL HCL 5 MG/ML IV SOLN: 10.0000 mg | INTRAVENOUS | Status: DC | PRN

## 2023-12-30 MED ORDER — FOLIC ACID 1 MG PO TABS
1.0000 mg | ORAL_TABLET | Freq: Every day | ORAL | Status: DC
Start: 2023-12-31 — End: 2024-01-30
  Administered 2023-12-31 – 2024-01-30 (×29): 1 mg via ORAL
  Filled 2023-12-30 (×33): qty 1

## 2023-12-30 MED ORDER — ONDANSETRON HCL 4 MG/2ML IJ SOLN: 4.0000 mg | Freq: Four times a day (QID) | INTRAMUSCULAR | Status: DC | PRN

## 2023-12-30 MED ORDER — QUETIAPINE FUMARATE 50 MG PO TABS
50.0000 mg | ORAL_TABLET | Freq: Two times a day (BID) | ORAL | Status: DC
  Administered 2023-12-30 – 2024-01-02 (×6): 50 mg via ORAL
  Filled 2023-12-30 (×6): qty 1

## 2023-12-30 MED ORDER — ENSURE ENLIVE PO LIQD: 237.0000 mL | Freq: Two times a day (BID) | ORAL | 12 refills | Status: DC

## 2023-12-30 MED ORDER — HEPARIN SODIUM (PORCINE) 5000 UNIT/ML IJ SOLN
5000.0000 [IU] | Freq: Three times a day (TID) | INTRAMUSCULAR | Status: DC
  Administered 2023-12-30 – 2024-01-30 (×86): 5000 [IU] via SUBCUTANEOUS
  Filled 2023-12-30 (×89): qty 1

## 2023-12-30 MED ORDER — LOSARTAN POTASSIUM 50 MG PO TABS
100.0000 mg | ORAL_TABLET | Freq: Every day | ORAL | Status: DC
  Administered 2023-12-31 – 2024-01-30 (×29): 100 mg via ORAL
  Filled 2023-12-30 (×33): qty 2

## 2023-12-30 MED ORDER — THIAMINE MONONITRATE 100 MG PO TABS
100.0000 mg | ORAL_TABLET | Freq: Every day | ORAL | Status: DC
  Administered 2023-12-31 – 2024-01-30 (×29): 100 mg via ORAL
  Filled 2023-12-30 (×31): qty 1

## 2023-12-30 MED ORDER — LACTULOSE 10 GM/15ML PO SOLN: 30.0000 g | Freq: Two times a day (BID) | ORAL | 0 refills | Status: DC

## 2023-12-30 MED ORDER — AMLODIPINE BESYLATE 10 MG PO TABS: 10.0000 mg | ORAL_TABLET | Freq: Every day | ORAL | 1 refills | Status: DC

## 2023-12-30 MED ORDER — VITAMIN B-1 100 MG PO TABS
100.0000 mg | ORAL_TABLET | Freq: Every day | ORAL | 0 refills | Status: DC
Start: 2023-12-31 — End: 2024-01-24

## 2023-12-30 MED ORDER — ORAL CARE MOUTH RINSE
15.0000 mL | OROMUCOSAL | Status: DC
  Administered 2023-12-30 – 2024-01-30 (×99): 15 mL via OROMUCOSAL

## 2023-12-30 MED ORDER — CARVEDILOL 25 MG PO TABS: 25.0000 mg | ORAL_TABLET | Freq: Two times a day (BID) | ORAL | Status: DC

## 2023-12-30 MED ORDER — HYDRALAZINE HCL 20 MG/ML IJ SOLN: 10.0000 mg | INTRAMUSCULAR | Status: DC | PRN

## 2023-12-30 MED ORDER — ACETAMINOPHEN 650 MG RE SUPP
650.0000 mg | Freq: Four times a day (QID) | RECTAL | Status: DC | PRN
Start: 2023-12-30 — End: 2024-01-30

## 2023-12-30 MED ORDER — GABAPENTIN 100 MG PO CAPS
200.0000 mg | ORAL_CAPSULE | Freq: Two times a day (BID) | ORAL | Status: DC
  Administered 2023-12-30 – 2024-01-30 (×58): 200 mg via ORAL
  Filled 2023-12-30 (×63): qty 2

## 2023-12-30 MED ORDER — PANTOPRAZOLE SODIUM 40 MG PO TBEC
40.0000 mg | DELAYED_RELEASE_TABLET | Freq: Every day | ORAL | Status: DC
  Administered 2023-12-31 – 2024-01-30 (×29): 40 mg via ORAL
  Filled 2023-12-30 (×17): qty 1
  Filled 2023-12-30: qty 2
  Filled 2023-12-30 (×13): qty 1

## 2023-12-30 MED ORDER — ACETAMINOPHEN 325 MG PO TABS
650.0000 mg | ORAL_TABLET | Freq: Four times a day (QID) | ORAL | Status: DC | PRN
  Administered 2023-12-30 – 2024-01-09 (×8): 650 mg via ORAL
  Filled 2023-12-30 (×9): qty 2

## 2023-12-30 MED ORDER — AMLODIPINE BESYLATE 10 MG PO TABS
10.0000 mg | ORAL_TABLET | Freq: Every day | ORAL | Status: DC
  Administered 2023-12-31 – 2024-01-30 (×29): 10 mg via ORAL
  Filled 2023-12-30 (×34): qty 1

## 2023-12-30 MED ORDER — ENSURE ENLIVE PO LIQD
237.0000 mL | Freq: Two times a day (BID) | ORAL | Status: DC
  Administered 2023-12-31 – 2024-01-01 (×3): 237 mL via ORAL

## 2023-12-30 MED ORDER — LORAZEPAM 0.5 MG PO TABS
0.5000 mg | ORAL_TABLET | Freq: Four times a day (QID) | ORAL | Status: DC | PRN
Start: 2023-12-30 — End: 2024-01-30
  Administered 2024-01-09 – 2024-01-24 (×6): 0.5 mg via ORAL
  Filled 2023-12-30 (×6): qty 1

## 2023-12-30 MED ORDER — NICOTINE 21 MG/24HR TD PT24
21.0000 mg | MEDICATED_PATCH | Freq: Every day | TRANSDERMAL | Status: DC
  Administered 2023-12-31 – 2024-01-30 (×29): 21 mg via TRANSDERMAL
  Filled 2023-12-30 (×36): qty 1

## 2023-12-30 MED ORDER — LOSARTAN POTASSIUM 100 MG PO TABS: 100.0000 mg | ORAL_TABLET | Freq: Every day | ORAL | 0 refills | Status: DC

## 2023-12-30 MED ORDER — QUETIAPINE FUMARATE 50 MG PO TABS: 50.0000 mg | ORAL_TABLET | Freq: Two times a day (BID) | ORAL | 0 refills | Status: DC

## 2023-12-30 MED ORDER — ACETAMINOPHEN 160 MG/5ML PO SOLN
650.0000 mg | Freq: Four times a day (QID) | ORAL | Status: DC | PRN
  Administered 2023-12-30: 650 mg
  Filled 2023-12-30: qty 20.3

## 2023-12-30 MED ORDER — POLYETHYLENE GLYCOL 3350 17 G PO PACK
17.0000 g | PACK | Freq: Every day | ORAL | Status: DC
  Administered 2023-12-31 – 2024-01-05 (×6): 17 g via ORAL
  Filled 2023-12-30 (×6): qty 1

## 2023-12-30 MED ORDER — GABAPENTIN 100 MG PO CAPS: 200.0000 mg | ORAL_CAPSULE | Freq: Two times a day (BID) | ORAL | 0 refills | Status: DC

## 2023-12-30 MED ORDER — PANTOPRAZOLE SODIUM 40 MG PO TBEC
40.0000 mg | DELAYED_RELEASE_TABLET | Freq: Every day | ORAL | 0 refills | Status: DC
Start: 2023-12-31 — End: 2024-01-24

## 2023-12-30 MED ORDER — CARVEDILOL 25 MG PO TABS: 25.0000 mg | ORAL_TABLET | Freq: Two times a day (BID) | ORAL | 1 refills | Status: DC

## 2023-12-30 MED ORDER — FOLIC ACID 1 MG PO TABS: 1.0000 mg | ORAL_TABLET | Freq: Every day | ORAL | 0 refills | Status: DC

## 2023-12-30 MED ORDER — ADULT MULTIVITAMIN W/MINERALS CH
1.0000 | ORAL_TABLET | Freq: Every day | ORAL | Status: DC
Start: 2023-12-31 — End: 2024-01-30
  Administered 2023-12-31 – 2024-01-30 (×29): 1 via ORAL
  Filled 2023-12-30 (×31): qty 1

## 2023-12-30 MED ORDER — LACTULOSE 10 GM/15ML PO SOLN
30.0000 g | Freq: Two times a day (BID) | ORAL | Status: DC
  Administered 2023-12-30 – 2024-01-17 (×35): 30 g via ORAL
  Filled 2023-12-30 (×37): qty 45

## 2023-12-30 MED ORDER — CARVEDILOL 25 MG PO TABS
25.0000 mg | ORAL_TABLET | Freq: Two times a day (BID) | ORAL | Status: DC
  Administered 2023-12-30 – 2024-01-30 (×58): 25 mg via ORAL
  Filled 2023-12-30 (×37): qty 1
  Filled 2023-12-30: qty 2
  Filled 2023-12-30 (×16): qty 1
  Filled 2023-12-30: qty 2
  Filled 2023-12-30 (×7): qty 1

## 2023-12-30 NOTE — H&P (Signed)
 Physical Medicine and Rehabilitation Admission H&P    Chief Complaint  Patient presents with   Functional deficits due to stroke.    HPI: Troy [facility] is a 33 year old male with history of  uncontrolled HTN, thrombocytopenia, cirrhosis of liver due to ETOH abuse (Dr. Dominic Bishop), ETOH withdrawal seizures who was admitted on 12/16/23 with acute onset of left sided weakness with lethargy. UDS showed ETOH level 264He was found to have acute large right cerebral hematoma with active extravasation/spot sign and no LVO Bishop emergent finding. Repeat CT head with increase in size of hemorrhage and was transfused with 2 units FFP by neurology. Dr. Michale Bishop consulted and recommended follow up CT head for monitoring. Tachycardia with agitation due to alcohol withdrawal treated with phenobarb taper.   He was started on hypertonic saline, required Cleviprex  for BP control and precedex  due to ongoing agitation. Serial CT head showed stable bleed. MRI brain showed stable hematoma centered in right basal ganglia extending into anterior right temporal love with extensive edema and 5 mm leftward shift and no abnormal contrast enhancement.  Dr. Christiane Bishop felt that bleed likely hypertensive in nature.   Medications added for blood pressure control and cleveprex has been weaned off. Thrombocytopenia has been stable.  BLE dopplers  negative for DVT. Abnormal LFTs have been monitored and remain elevated.  He has had issues with left hip pain and X rays done negative for fracture Bishop contusion. LUE ultrasound done due to pain and redness and showed evidence of superficial vein thrombosis left cephalic vein. Low grade fevers have resolved. Mentation and activity tolerance have improved. He requires +2 mod to max assist for standing with max cues for posture and max to total assist with cues for ADLs. He was independent PTA and CIR recommended due to functional decline.    ROS: impaired attention as per family   Past Medical History:   Diagnosis Date   Liver disease    Painful orthopaedic hardware (HCC) 07/2017   right tibia   Verruca vulgaris 2023   penile mass    Past Surgical History:  Procedure Laterality Date   BREATH TEK H PYLORI N/A 09/08/2016   Procedure: BREATH TEK [facility];  Surgeon: [facility];  Location: [facility];  Service: Gastroenterology;  Laterality: N/A;   HARDWARE REMOVAL Left 07/27/2017   Procedure: Removal of deep implants right proximal and distal tibia;  Surgeon: [facility];  Location: [facility];  Service: Orthopedics;  Laterality: Left;   TENDON REPAIR Left 08/14/2014   Procedure: LEFT HAND WOUND EXPLORATION AND TENDON REPAIR;  Surgeon: [facility];  Location: [facility];  Service: Orthopedics;  Laterality: Left;   TIBIA IM NAIL INSERTION  07/31/2012   Procedure: INTRAMEDULLARY (IM) NAIL TIBIAL;  Surgeon: [facility];  Location: [facility];  Service: Orthopedics;  Laterality: Left;   UPPER GI ENDOSCOPY  07/25/2016    Family History  Problem Relation Bishop of Onset   Esophageal cancer Neg Hx    Liver disease Neg Hx    Colon cancer Neg Hx     Social History:  Married reports that he quit smoking about 9 years ago. His smoking use included cigarettes. He has never used smokeless tobacco. He reports current alcohol use. He reports that he does not use drugs.   Allergies: No Known Allergies   Medications Prior to Admission  Medication Sig Dispense Refill   risankizumab-rzaa (SKYRIZI PEN) 150 MG/ML pen Inject  150 mg as directed every 3 (three) months.     Home: Home Living Family/patient expects to be discharged to:: Private residence Living Arrangements: Spouse/significant other, Children (children are 7, 91 , 69 and 17 year old.) Available Help at Discharge: Available 24 hours/day (wife does not work) Type of Home: House Home Access: Level entry Home Layout: One level Bathroom Shower/Tub: Tub/shower unit, Engineer, building services:  Standard Bathroom Accessibility: Yes Additional Comments: verified with patient and wife on 5/22  Lives With: Spouse, Family   Functional History: Prior Function Prior Level of Function : Independent/Modified Independent Mobility Comments: 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 During efforts to get to EOB but became very restless from L hip pain and could not tolerate upright sitting. Attempted to sit pt upright x2 using different approaches but he would repel himself back into a supine position on the bed at make himself a risk to slide forward. Pt needing consistent cueing and redirection for his safety, deferred further EOB attempts for pt safety. Transfers Overall transfer level: Needs assistance Equipment used: 2 person hand held assist Transfers: Sit to/from Stand Sit to Stand: +2 physical assistance, Min assist General transfer comment: Pt able to stand x3 with +2 Min A. Pt noted to lean to the L when standing and requiring max cues for upright posture and to find midline in the mirror. Ambulation/Gait General Gait Details: unable   ADL: ADL Overall ADL's : Needs assistance/impaired Eating/Feeding: NPO Grooming: Moderate assistance, Maximal assistance, Sitting Grooming Details (indicate cue type and reason): cues for receiving grooming items Upper Body Bathing: Set up Upper Body Bathing Details (indicate cue type and reason): washes L arm with RUE Lower Body Bathing: Total assistance, +2 for physical assistance, +2 for safety/equipment Upper Body Dressing : Maximal assistance,  Sitting Lower Body Dressing: Maximal assistance Lower Body Dressing Details (indicate cue type and reason): to don socks using figure 4 from bed level. Cues for problem solving, more reliant on OT to place LLE Toilet Transfer: Maximal assistance, +2 for physical assistance, +2 for safety/equipment Toilet Transfer Details (indicate cue type and reason): stedy Functional mobility during ADLs: +2 for physical assistance, Total assistance General ADL Comments: Pt not tolerable to EOB sitting to progress with ADLs   Cognition: Cognition Overall Cognitive Status: Impaired/Different from baseline Orientation Level: (P) Oriented to person, Oriented to place, Oriented to time Year: 2025 Month: May Day of Week: Correct Attention: Sustained Sustained Attention: Impaired Sustained Attention Impairment: Verbal basic, Functional basic Awareness: Impaired Awareness Impairment: Intellectual impairment Problem Solving: Impaired Problem Solving Impairment: Functional basic Executive Function: Self Monitoring Self Monitoring: Impaired Self Monitoring Impairment: Functional basic Behaviors: Restless, Impulsive, Perseveration Cognition Arousal: Alert Behavior During Therapy: Restless Overall Cognitive Status: Impaired/Different from baseline   There were no vitals taken for this visit. Physical Exam Gen: no distress, normal appearing HEENT: oral mucosa pink and moist, NCAT Cardio: Reg rate Chest: normal effort, normal rate of breathing Abd: soft, non-distended Ext: no edema Psych: pleasant, normal affect Skin: intact Neuro: Alert and oriented x3, impaired attention, impulsive, restless Musculoskeletal: LLE 0/5 strength, LUE 0/5 except he can  move fingers, right sided strength is intact, decreased sensation throughout left side  Results for orders placed Bishop performed during the hospital encounter of 12/16/23 (from the past 48 hours)  Comprehensive metabolic panel with GFR     Status: Abnormal    Collection Time: 12/29/23  6:42 AM  Result Value Ref Range   Sodium 139 135 - 145 mmol/L   Potassium 4.0 3.5 - 5.1 mmol/L   Chloride 107 98 - 111 mmol/L   CO2 25 22 - 32 mmol/L   Glucose, Bld 100 (H) 70 - 99 mg/dL    Comment: Glucose reference range applies only to samples taken after fasting for at least 8 hours.   BUN 15 6 - 20 mg/dL   Creatinine, Ser 1.61 0.61 - 1.24 mg/dL   Calcium  9.3 8.9 - 10.3 mg/dL   Total Protein 7.6 6.5 - 8.1 g/dL   Albumin 2.9 (L) 3.5 - 5.0 g/dL   AST 096 (H) 15 - 41 U/L   ALT 340 (H) 0 - 44 U/L   Alkaline Phosphatase 96 38 - 126 U/L   Total Bilirubin 0.6 0.0 - 1.2 mg/dL   GFR, Estimated >04 >54 mL/min    Comment: (NOTE) Calculated using the CKD-EPI Creatinine Equation (2021)    Anion gap 7 5 - 15    Comment: Performed at [facility], [address], [place], [place] [address]  CBC     Status: Abnormal   Collection Time: 12/29/23  6:42 AM  Result Value Ref Range   WBC 7.7 4.0 - 10.5 K/uL   RBC 4.16 (L) 4.22 - 5.81 MIL/uL   Hemoglobin 12.7 (L) 13.0 - 17.0 g/dL   HCT 91.4 78.2 - 95.6 %   MCV 95.7 80.0 - 100.0 fL   MCH 30.5 26.0 - 34.0 pg   MCHC 31.9 30.0 - 36.0 g/dL   RDW 21.3 08.6 - 57.8 %   Platelets 261 150 - 400 K/uL    Comment: REPEATED TO VERIFY   nRBC 0.0 0.0 - 0.2 %    Comment: Performed at [facility], [address] [place], [place] [address]   No results found.     There were no vitals taken for this visit.  Medical Problem List and Plan: 1. Functional deficits secondary to right basal ganglia large ICH  -patient may shower  -ELOS/Goals: 12-16 days S  Admit to CIR 2.  Antithrombotics: -DVT/anticoagulation:  Pharmaceutical: Lovenox   -antiplatelet therapy: N/A 3. Pain Management: Tylenol  prn. Continue gabapentin  4. Mood/Behavior/Sleep: LCSW to follow for evaluation and support.   -antipsychotic agents: N/A 5. Neuropsych/cognition: This patient is not capable of making decisions on his own behalf. 6.  Skin/Wound Care: Routine pressure relief measures.  7. Fluids/Electrolytes/Nutrition: Monitor I/O. Check CMET in am.   8. Metabolic encephalopathy/ETOH withdrawal: Has resolved, wean seroquel  as tolerated  9. Abnormal LFTs:  AST trending down. Continue to monitor  10. ABLA?: monitor Hgb weekly  11. HTN: continue amlodipine , coreg , losartan     I have personally performed a face to face diagnostic evaluation, including, but not limited to relevant history and physical exam findings, of this patient and developed relevant assessment and plan.  Additionally, I have reviewed and concur with the physician assistant's documentation above.  Zelda Hickman, PA-C   Liam Redhead, Bishop 12/30/2023

## 2023-12-30 NOTE — Plan of Care (Signed)
  Problem: Education: Goal: Knowledge of disease or condition will improve Outcome: Progressing Goal: Knowledge of secondary prevention will improve (MUST DOCUMENT ALL) Outcome: Progressing Goal: Knowledge of patient specific risk factors will improve (DELETE if not current risk factor) Outcome: Progressing   Problem: Intracerebral Hemorrhage Tissue Perfusion: Goal: Complications of Intracerebral Hemorrhage will be minimized Outcome: Progressing   Problem: Coping: Goal: Will verbalize positive feelings about self Outcome: Progressing Goal: Will identify appropriate support needs Outcome: Progressing   Problem: Health Behavior/Discharge Planning: Goal: Ability to manage health-related needs will improve Outcome: Progressing Goal: Goals will be collaboratively established with patient/family Outcome: Progressing   Problem: Self-Care: Goal: Ability to participate in self-care as condition permits will improve Outcome: Progressing Goal: Verbalization of feelings and concerns over difficulty with self-care will improve Outcome: Progressing Goal: Ability to communicate needs accurately will improve Outcome: Progressing   Problem: Nutrition: Goal: Risk of aspiration will decrease Outcome: Progressing Goal: Dietary intake will improve Outcome: Progressing   Problem: Education: Goal: Knowledge of General Education information will improve Description: Including pain rating scale, medication(s)/side effects and non-pharmacologic comfort measures Outcome: Progressing   Problem: Health Behavior/Discharge Planning: Goal: Ability to manage health-related needs will improve Outcome: Progressing   Problem: Clinical Measurements: Goal: Ability to maintain clinical measurements within normal limits will improve Outcome: Progressing Goal: Will remain free from infection Outcome: Progressing Goal: Diagnostic test results will improve Outcome: Progressing Goal: Respiratory  complications will improve Outcome: Progressing Goal: Cardiovascular complication will be avoided Outcome: Progressing   Problem: Activity: Goal: Risk for activity intolerance will decrease Outcome: Progressing   Problem: Nutrition: Goal: Adequate nutrition will be maintained Outcome: Progressing   Problem: Coping: Goal: Level of anxiety will decrease Outcome: Progressing   Problem: Elimination: Goal: Will not experience complications related to bowel motility Outcome: Progressing Goal: Will not experience complications related to urinary retention Outcome: Progressing   Problem: Pain Managment: Goal: General experience of comfort will improve and/or be controlled Outcome: Progressing   Problem: Safety: Goal: Ability to remain free from injury will improve Outcome: Progressing   Problem: Skin Integrity: Goal: Risk for impaired skin integrity will decrease Outcome: Progressing   Problem: Safety: Goal: Non-violent Restraint(s) Outcome: Progressing

## 2023-12-30 NOTE — Discharge Summary (Signed)
 Physician Discharge Summary  Greogory Cornette RUE:[id number] DOB: Nov 03, 1990 DOA: 12/16/2023  PCP: Pcp, No  Admit date: 12/16/2023  Discharge date: 12/30/2023  Admitted From:Home  Disposition:  CIR  Recommendations for Outpatient Follow-up:  Follow up with neurology in 3 months Follow-up LFTs in 1 week and start atorvastatin  40 mg daily once LFT normalizes Continue other medications as noted below  Home Health: None  Equipment/Devices: None  Discharge Condition:Stable  CODE STATUS: Full  Diet recommendation: Dys 3  Brief/Interim Summary: 33 yo male with past medical history of hypertension, seizure and daily alcohol use admitted for acute onset left-sided weakness. He was found to have a large right subcortical ICH. admitted on 5/10. Repeat Ct head with enlargement of ICH and he received PLT transfusion for Plt 54 . was on clevipres has been off for multiple days and started on Po Meds. On CIWA protocol for withdrawal. core track tube placed with TF infusing.  He is not able to tolerate dysphagia 3 diet.  Elevated liver enzymes, fevers which are likely central. Therapy recommending CIR and he currently has a bed available for placement.  Discharge Diagnoses:  Principal Problem:   Intracranial hemorrhage (HCC) Active Problems:   Malnutrition of moderate degree  Right basal ganglia large ICH Acute metabolic encephalopathy: Likely hypertensive ICH. Patient admitted under code stroke complaints CT heads CTA head and neck as below Code Stroke CT head 35 mm ICH in right frontotemporal region CTA head & neck focus of contrast enhancement within bed of right cerebral hematoma, no other underlying vascular abnormality Repeat CT head> increase in size of right cerebral hemorrhage, estimated volume 52 mL Repeat CT head 5/10 unchanged right cerebral hematoma with 6 mm of midline shift Repeat CT head 5/16 stable MRI stable hematoma and MLS, no abnormal enhancement  Stroke workup completed :TTE  normal EF, LDL 124 A1c 5.6 No antithrombotic prior to admission,  No antithrombotic secondary to ICH Continue Seroquel  50 twice daily, as needed sedatives Transfer to CIR     Cerebral edema: Due to Large right ICH with 6 mm midline shift noted on CT 5/10 and 5/11: S/P 3% hypertonic saline In ICU. MRI stable   Left upper extremity superior thrombophlebitis: Continue supportive care   Left hip pain: Will obtain x-ray, did not show any abnormality start gabapentin twice daily-watch for sedation.  Still complains of pain and states Tylenol  not helping.  Had one-time dose of tramadol .   Transient fever, likely central fever: Currently resolved   Hypertension PTA not on meds, In ICU needed Cleviprex  drip. BP well-controlled on current regimen with amlodipine  10 Coreg  25 twice daily, losartan  100. Goal sbp<160   Hyperlipidemia: LDL 124, goal < 70.  Start atorvastatin  40 mg once LFTs downtrend and repeat in 1 week.    Alcohol withdrawal W/ alcohol abuse drinking 2-3/d EtOH level on admission 264. Initially on Precedex  on CIWA protocol Ativan .  At this time overall stable continue Ativan  as needed, Seroquel  ETOH abuse cessation has been discussed    Thrombocytopenia: Likely from underlying liver dysfunction/alcohol abuse, rs/p platelet transfusion x 1. Now  Improved   Elevated LFTs: Transaminitis likely from alcohol abuse -LFTs very slow to improve.  Repeat in 1 week   Dysphagia: Dysphagia 3 diet  Discharge Instructions  Discharge Instructions     Diet - low sodium heart healthy   Complete by: As directed    Increase activity slowly   Complete by: As directed       Allergies as of 12/30/2023  No Known Allergies      Medication List     TAKE these medications    amLODipine  10 MG tablet Commonly known as: NORVASC  Take 1 tablet (10 mg total) by mouth daily.   carvedilol  25 MG tablet Commonly known as: COREG  Take 1 tablet (25 mg total) by mouth 2 (two) times  daily with a meal.   feeding supplement Liqd Take 237 mLs by mouth 2 (two) times daily between meals.   folic acid  1 MG tablet Commonly known as: FOLVITE  Take 1 tablet (1 mg total) by mouth daily. Start taking on: Dec 31, 2023   gabapentin 100 MG capsule Commonly known as: NEURONTIN Take 2 capsules (200 mg total) by mouth 2 (two) times daily.   lactulose  10 GM/15ML solution Commonly known as: CHRONULAC  Take 45 mLs (30 g total) by mouth 2 (two) times daily.   losartan  100 MG tablet Commonly known as: COZAAR  Take 1 tablet (100 mg total) by mouth daily. Start taking on: Dec 31, 2023   pantoprazole  40 MG tablet Commonly known as: PROTONIX  Take 1 tablet (40 mg total) by mouth daily. Start taking on: Dec 31, 2023   QUEtiapine  50 MG tablet Commonly known as: SEROQUEL  Take 1 tablet (50 mg total) by mouth 2 (two) times daily.   Skyrizi Pen 150 MG/ML pen Generic drug: risankizumab-rzaa Inject 150 mg as directed every 3 (three) months.   thiamine  100 MG tablet Commonly known as: Vitamin B-1 Take 1 tablet (100 mg total) by mouth daily. Start taking on: Dec 31, 2023        Follow-up Information     [facility]. Go in 3 month(s).   Specialty: Neurology Contact information: [address] [place]  [address] [phone]               No Known Allergies  Consultations: Neurology   Procedures/Studies: DG HIP UNILAT WITH PELVIS 2-3 VIEWS LEFT Result Date: 12/28/2023 CLINICAL DATA:  221992 Left hip pain 221992 EXAM: DG HIP (WITH OR WITHOUT PELVIS) 2-3V LEFT COMPARISON:  Dec 17, 2023 FINDINGS: No acute fracture or dislocation. There is no evidence of arthropathy or other focal bone abnormality. Soft tissues are unremarkable. IMPRESSION: No acute fracture or dislocation. Electronically Signed   By: Rance Burrows M.D.   On: 12/28/2023 11:03   VAS US  UPPER EXTREMITY VENOUS DUPLEX Result Date: 12/26/2023 UPPER  VENOUS STUDY  Patient Name:  Marbury Krenz  Date of Exam:   12/26/2023 Medical Rec #: [id number]  Accession #:    [id number] Date of Birth: [birth year]   Patient Gender: M Patient Age:   33 years Exam Location:  [facility] Procedure:      VAS US  UPPER EXTREMITY VENOUS DUPLEX Referring Phys: RAMESH KC --------------------------------------------------------------------------------  Indications: Pain, and redness. Unable to move left arm. Comparison Study: No priors. Performing Technologist: Franky Ivanoff Sturdivant-Jones RDMS, RVT  Examination Guidelines: A complete evaluation includes B-mode imaging, spectral Doppler, color Doppler, and power Doppler as needed of all accessible portions of each vessel. Bilateral testing is considered an integral part of a complete examination. Limited examinations for reoccurring indications may be performed as noted.  Right Findings: +----------+------------+---------+-----------+----------+-------+ RIGHT     CompressiblePhasicitySpontaneousPropertiesSummary +----------+------------+---------+-----------+----------+-------+ Subclavian               Yes       Yes                      +----------+------------+---------+-----------+----------+-------+  Left Findings: +----------+------------+---------+-----------+----------+-------+ LEFT      CompressiblePhasicitySpontaneousPropertiesSummary +----------+------------+---------+-----------+----------+-------+ IJV           Full       Yes       Yes                      +----------+------------+---------+-----------+----------+-------+ Subclavian    Full       Yes       Yes                      +----------+------------+---------+-----------+----------+-------+ Axillary      Full       Yes       Yes                      +----------+------------+---------+-----------+----------+-------+ Brachial      Full                                           +----------+------------+---------+-----------+----------+-------+ Radial        Full                                          +----------+------------+---------+-----------+----------+-------+ Ulnar         Full                                          +----------+------------+---------+-----------+----------+-------+ Cephalic      None       No        No                Acute  +----------+------------+---------+-----------+----------+-------+ Basilic       Full                                          +----------+------------+---------+-----------+----------+-------+  Summary:  Right: No evidence of thrombosis in the subclavian.  Left: No evidence of deep vein thrombosis in the upper extremity. Findings consistent with acute superficial vein thrombosis involving the left cephalic vein.  *See table(s) above for measurements and observations.  Diagnosing physician: Genny Kid MD Electronically signed by Genny Kid MD on 12/26/2023 at 7:09:07 PM.    Final    DG Chest Port 1 View Result Date: 12/26/2023 CLINICAL DATA:  Fever. EXAM: PORTABLE CHEST 1 VIEW COMPARISON:  Chest radiograph dated 12/17/2023. FINDINGS: No focal consolidation, pleural effusion, or pneumothorax. Stable cardiac silhouette. No acute osseous pathology. IMPRESSION: No active disease. Electronically Signed   By: Angus Bark M.D.   On: 12/26/2023 12:40   VAS US  LOWER EXTREMITY VENOUS (DVT) Result Date: 12/22/2023  Lower Venous DVT Study Patient Name:  JOSEDE Monical  Date of Exam:   12/21/2023 Medical Rec #: [id number]  Accession #:    [id number] Date of Birth: [birth year]   Patient Gender: M Patient Age:   34 years Exam Location:  [facility] Procedure:      VAS US  LOWER EXTREMITY VENOUS (DVT) Referring Phys: Jannet Memos --------------------------------------------------------------------------------  Indications: Edema.  Limitations: Patient position / movement. Comparison Study: No previous exams  Performing  Technologist: Arlyce Berger RVT, RDMS  Examination Guidelines: A complete evaluation includes B-mode imaging, spectral Doppler, color Doppler, and power Doppler as needed of all accessible portions of each vessel. Bilateral testing is considered an integral part of a complete examination. Limited examinations for reoccurring indications may be performed as noted. The reflux portion of the exam is performed with the patient in reverse Trendelenburg.  +---------+---------------+---------+-----------+----------+-------------------+ RIGHT    CompressibilityPhasicitySpontaneityPropertiesThrombus Aging      +---------+---------------+---------+-----------+----------+-------------------+ CFV      Full           Yes      Yes                                      +---------+---------------+---------+-----------+----------+-------------------+ SFJ      Full                                         Not well visualized +---------+---------------+---------+-----------+----------+-------------------+ FV Prox  Full           Yes      Yes                                      +---------+---------------+---------+-----------+----------+-------------------+ FV Mid   Full           Yes      Yes                                      +---------+---------------+---------+-----------+----------+-------------------+ FV DistalFull           Yes      Yes                                      +---------+---------------+---------+-----------+----------+-------------------+ PFV      Full                                                             +---------+---------------+---------+-----------+----------+-------------------+ POP      Full           Yes      Yes                                      +---------+---------------+---------+-----------+----------+-------------------+ PTV      Full                                                              +---------+---------------+---------+-----------+----------+-------------------+ PERO     Full                                                             +---------+---------------+---------+-----------+----------+-------------------+  Difficulty imagig left leg due to patient movement  +---------+---------------+---------+-----------+----------+--------------+ LEFT     CompressibilityPhasicitySpontaneityPropertiesThrombus Aging +---------+---------------+---------+-----------+----------+--------------+ CFV      Full           Yes      Yes                                 +---------+---------------+---------+-----------+----------+--------------+ SFJ      Full                                                        +---------+---------------+---------+-----------+----------+--------------+ FV Prox  Full           Yes      Yes                                 +---------+---------------+---------+-----------+----------+--------------+ FV Mid   Full           Yes      Yes                                 +---------+---------------+---------+-----------+----------+--------------+ FV DistalFull           Yes      Yes                                 +---------+---------------+---------+-----------+----------+--------------+ PFV      Full                                                        +---------+---------------+---------+-----------+----------+--------------+ POP      Full           Yes      Yes                                 +---------+---------------+---------+-----------+----------+--------------+ PTV      Full                                                        +---------+---------------+---------+-----------+----------+--------------+ PERO     Full                                                        +---------+---------------+---------+-----------+----------+--------------+     Summary: BILATERAL: - No evidence of deep vein  thrombosis seen in the lower extremities, bilaterally. -No evidence of popliteal cyst, bilaterally.   *See table(s) above for measurements and observations. Electronically signed by Delaney Fearing on 12/22/2023 at 9:36:33 AM.    Final    CT HEAD WO CONTRAST (  ) Result Date: 12/22/2023 CLINICAL DATA:  33 year old male code stroke presentation on 12/16/2023 with right hemisphere intra-axial hemorrhage. Subsequent encounter. EXAM: CT HEAD WITHOUT CONTRAST TECHNIQUE: Contiguous axial images were obtained from the base of the skull through the vertex without intravenous contrast. RADIATION DOSE REDUCTION: This exam was performed according to the departmental dose-optimization program which includes automated exposure control, adjustment of the mA and/or kV according to patient size and/or use of iterative reconstruction technique. COMPARISON:  Brain MRI 12/19/2023 and earlier. FINDINGS: Brain: Large and lobulated hyperdense right hemisphere intra-axial hemorrhage with epicenter at the right lentiform and/or thalamus now encompasses 57 x 54 by 54 mm (AP by transverse by CC) for an estimated blood volume of 83 mL). This was 58 x 56 by 52 mm on 12/17/2023 CT (85 mL). Surrounding hypodense edema has not significantly changed. Regional mass effect with effacement of the right lateral ventricle and leftward midline shift are both slightly improved (midline shift previously 7 mm, now 6 mm). Trace intraventricular blood on MRI is not apparent. Stable basilar cistern patency, partially effaced suprasellar cistern. No ventriculomegaly. Stable gray-white differentiation elsewhere. No new intracranial hemorrhage. Vascular: No suspicious intracranial vascular hyperdensity. Skull: Chronic left lamina papyracea fracture. Calvarium is intact. No acute osseous abnormality identified. Sinuses/Orbits: Maxillary sinus mucosal thickening. Chronic appearing right mastoid sclerosis. Other Visualized paranasal sinuses and mastoids are stable  and well aerated. Other: Orbit and scalp soft tissues appears stable and negative. IMPRESSION: 1. Large right hemisphere intra-axial hemorrhage with minimal retraction since 12/17/2023. Stable surrounding edema with decreased leftward midline shift by about 1 mm. 2. Stable basilar cisterns, no progressive IVH, and no new intracranial abnormality identified. Electronically Signed   By: Marlise Simpers M.D.   On: 12/22/2023 04:50   MR BRAIN W WO CONTRAST Result Date: 12/19/2023 CLINICAL DATA:  Hemorrhagic stroke EXAM: MRI HEAD WITHOUT AND WITH CONTRAST TECHNIQUE: Multiplanar, multiecho pulse sequences of the brain and surrounding structures were obtained without and with intravenous contrast. CONTRAST:  6mL GADAVIST  GADOBUTROL  1 MMOL/ML IV SOLN COMPARISON:  12/17/2023 FINDINGS: Brain: Massive intracranial hemorrhage centered in the right basal ganglia, extending into the anterior right temporal lobe is unchanged. There is abnormal diffusion restriction along the periphery of the hemorrhage. There is multifocal hyperintense T2-weighted signal within the white matter. Parenchymal volume and CSF spaces are normal. Extensive edema surrounding the hemorrhage in the right hemisphere. 5 mm of leftward midline shift. There is no abnormal contrast enhancement. Vascular: Normal flow voids. Skull and upper cervical spine: Normal calvarium and skull base. Visualized upper cervical spine and soft tissues are normal. Sinuses/Orbits:No paranasal sinus fluid levels or advanced mucosal thickening. No mastoid or middle ear effusion. Normal orbits. IMPRESSION: 1. Unchanged massive intracranial hemorrhage centered in the right basal ganglia, extending into the anterior right temporal lobe. Peripheral diffusion restriction, consistent with hemorrhagic ischemic infarct. 2. No abnormal contrast enhancement. Electronically Signed   By: Juanetta Nordmann M.D.   On: 12/19/2023 03:08   ECHOCARDIOGRAM COMPLETE Result Date: 12/18/2023    ECHOCARDIOGRAM  REPORT   Patient Name:   AZAR Wollard  Date of Exam: 12/18/2023 Medical Rec #:  [id number]  Height:       66.0 in Accession #:    [id number] Weight:       139.8 lb Date of Birth:  [birth year]   BSA:          1.717 m Patient Age:    33 years   BP:  142/103 mmHg Patient Gender: M          HR:           76 bpm. Exam Location:  Inpatient Procedure: 2D Echo, Cardiac Doppler and Color Doppler (Both Spectral and Color            Flow Doppler were utilized during procedure). Indications:     Stroke  History:         Patient has no prior history of Echocardiogram examinations.  Sonographer:     Juanita Shaw Referring Phys:  [phone] MCNEILL P KIRKPATRICK Diagnosing Phys: Pasqual Bone MD IMPRESSIONS  1. Left ventricular ejection fraction, by estimation, is 60 to 65%. The left ventricle has normal function. The left ventricle has no regional wall motion abnormalities. Left ventricular diastolic parameters were normal.  2. Right ventricular systolic function is normal. The right ventricular size is normal.  3. Left atrial size was mildly dilated.  4. The mitral valve is normal in structure. Mild mitral valve regurgitation.  5. The aortic valve is tricuspid. Aortic valve regurgitation is not visualized.  6. The inferior vena cava is normal in size with greater than 50% respiratory variability, suggesting right atrial pressure of 3 mmHg. FINDINGS  Left Ventricle: Left ventricular ejection fraction, by estimation, is 60 to 65%. The left ventricle has normal function. The left ventricle has no regional wall motion abnormalities. The left ventricular internal cavity size was normal in size. There is  no left ventricular hypertrophy. Left ventricular diastolic parameters were normal. Right Ventricle: The right ventricular size is normal. No increase in right ventricular wall thickness. Right ventricular systolic function is normal. Left Atrium: Left atrial size was mildly dilated. Right Atrium: Right atrial size was normal in size.  Pericardium: There is no evidence of pericardial effusion. Mitral Valve: The mitral valve is normal in structure. Mild mitral valve regurgitation. MV peak gradient, 2.2 mmHg. The mean mitral valve gradient is 1.0 mmHg. Tricuspid Valve: The tricuspid valve is normal in structure. Tricuspid valve regurgitation is trivial. Aortic Valve: The aortic valve is tricuspid. Aortic valve regurgitation is not visualized. Aortic valve mean gradient measures 2.0 mmHg. Aortic valve peak gradient measures 4.2 mmHg. Aortic valve area, by VTI measures 3.09 cm. Pulmonic Valve: The pulmonic valve was normal in structure. Pulmonic valve regurgitation is not visualized. Aorta: The aortic root is normal in size and structure. Venous: The inferior vena cava is normal in size with greater than 50% respiratory variability, suggesting right atrial pressure of 3 mmHg. IAS/Shunts: The atrial septum is grossly normal.  LEFT VENTRICLE PLAX 2D LVIDd:         5.20 cm      Diastology LVIDs:         3.50 cm      LV e' medial:    6.74 cm/s LV PW:         1.00 cm      LV E/e' medial:  10.0 LV IVS:        0.70 cm      LV e' lateral:   12.70 cm/s LVOT diam:     2.00 cm      LV E/e' lateral: 5.3 LV SV:         56 LV SV Index:   32 LVOT Area:     3.14 cm  LV Volumes (MOD) LV vol d, MOD A2C: 176.0 ml LV vol d, MOD A4C: 136.0 ml LV vol s, MOD A2C: 55.3 ml LV vol s, MOD A4C:  63.9 ml LV SV MOD A2C:     120.7 ml LV SV MOD A4C:     136.0 ml LV SV MOD BP:      95.8 ml RIGHT VENTRICLE RV Basal diam:  3.60 cm RV Mid diam:    3.30 cm RV S prime:     12.80 cm/s TAPSE (M-mode): 2.4 cm LEFT ATRIUM             Index        RIGHT ATRIUM           Index LA diam:        3.00 cm 1.75 cm/m   RA Area:     10.10 cm LA Vol (A2C):   65.7 ml 38.26 ml/m  RA Volume:   19.60 ml  11.41 ml/m LA Vol (A4C):   21.5 ml 12.52 ml/m LA Biplane Vol: 41.1 ml 23.93 ml/m  AORTIC VALVE                    PULMONIC VALVE AV Area (Vmax):    2.68 cm     PV Vmax:       0.80 m/s AV Area  (Vmean):   2.72 cm     PV Peak grad:  2.6 mmHg AV Area (VTI):     3.09 cm AV Vmax:           102.00 cm/s AV Vmean:          67.500 cm/s AV VTI:            0.180 m AV Peak Grad:      4.2 mmHg AV Mean Grad:      2.0 mmHg LVOT Vmax:         87.00 cm/s LVOT Vmean:        58.500 cm/s LVOT VTI:          0.177 m LVOT/AV VTI ratio: 0.98  AORTA Ao Root diam: 3.40 cm Ao Asc diam:  3.20 cm MITRAL VALVE MV Area (PHT): 4.93 cm    SHUNTS MV Area VTI:   2.60 cm    Systemic VTI:  0.18 m MV Peak grad:  2.2 mmHg    Systemic Diam: 2.00 cm MV Mean grad:  1.0 mmHg MV Vmax:       0.74 m/s MV Vmean:      45.6 cm/s MV Decel Time: 154 msec MV E velocity: 67.10 cm/s MV A velocity: 49.80 cm/s MV E/A ratio:  1.35 Pasqual Bone MD Electronically signed by Pasqual Bone MD Signature Date/Time: 12/18/2023/12:36:55 PM    Final    EEG adult Result Date: 12/18/2023 Eleni Griffin, MD     12/18/2023  7:26 AM Routine EEG Report Neamiah Sciarra is a 33 y.o. male with a history of R ICH and AMS who is undergoing an EEG to evaluate for seizures. Report: This EEG was acquired with electrodes placed according to the International 10-20 electrode system (including Fp1, Fp2, F3, F4, C3, C4, P3, P4, O1, O2, T3, T4, T5, T6, A1, A2, Fz, Cz, Pz). The following electrodes were missing or displaced: none. The occipital dominant rhythm was 8.5 Hz. This activity is reactive to stimulation. Drowsiness was manifested by background fragmentation; deeper stages of sleep were not identified. There was focal slowing over the right hemisphere. There were no interictal epileptiform discharges. There were no electrographic seizures identified. Photic stimulation and hyperventilation were not performed. Impression and clinical correlation: This EEG was obtained while awake and drowsy and  is abnormal due to R focal slowing over the region of intracerebral hemorrhage. Epileptiform abnormalities were not seen during this recording. Greg Leaks, MD [facility]  [phone] If 7pm- 7am, please page neurology on call as listed in AMION.   CT HEAD WO CONTRAST ( ) Result Date: 12/17/2023 CLINICAL DATA:  Follow-up examination for intracranial hemorrhage, headache, neuro deficit. EXAM: CT HEAD WITHOUT CONTRAST TECHNIQUE: Contiguous axial images were obtained from the base of the skull through the vertex without intravenous contrast. RADIATION DOSE REDUCTION: This exam was performed according to the departmental dose-optimization program which includes automated exposure control, adjustment of the mA and/or kV according to patient size and/or use of iterative reconstruction technique. COMPARISON:  CT from earlier the same day at 5:58 a.m. FINDINGS: Brain: Previously identified intraparenchymal hematoma centered at the right lentiform nucleus with extension into the adjacent right temporal cerebral white matter again seen, not significantly changed in size or morphology measuring up to 5.7 cm in dimension. Similar localized edema and regional mass effect with partial effacement of the right lateral ventricle and 5-6 mm of right-to-left shift. Stable ventricular size and morphology without hydrocephalus. No progressive intraventricular or extra-axial extension or other interval complicating features. Basilar cisterns remain patent. No other new intracranial hemorrhage or large vessel territory infarct. No extra-axial fluid collection. Vascular: No abnormal hyperdense vessel. Skull: Scalp soft tissues and calvarium demonstrate no new finding. Sinuses/Orbits: Globes orbital soft tissues demonstrate no acute finding. Remote posttraumatic defect at the left lamina papyracea. Paranasal sinuses and mastoid air cells remain largely clear. Other: None. IMPRESSION: 1. No significant interval change in size and morphology of intraparenchymal hematoma centered at the right lentiform nucleus. Similar regional mass effect with 5-6 mm of right-to-left shift. No hydrocephalus or other  complicating features. 2. No other new acute intracranial abnormality. Electronically Signed   By: Virgia Griffins M.D.   On: 12/17/2023 18:45   DG Abd 1 View Result Date: 12/17/2023 CLINICAL DATA:  Vomiting. EXAM: ABDOMEN - 1 VIEW COMPARISON:  None Available. FINDINGS: Air within nondilated stomach. No small bowel distension or evidence of obstruction. There is air throughout nondilated colon. No significant formed colonic stool. No radiopaque calculi or abnormal soft tissue calcifications. No osseous abnormalities are seen. IMPRESSION: Nonobstructive bowel gas pattern. Electronically Signed   By: Chadwick Colonel M.D.   On: 12/17/2023 18:26   DG CHEST PORT 1 VIEW Result Date: 12/17/2023 CLINICAL DATA:  Fever. EXAM: PORTABLE CHEST 1 VIEW COMPARISON:  05/03/2017 FINDINGS: Upper normal heart size likely accentuated by portable AP technique. Mediastinal contours are stable. No confluent consolidation. No pleural fluid or pneumothorax. No pulmonary edema. IMPRESSION: No acute chest findings. Electronically Signed   By: Chadwick Colonel M.D.   On: 12/17/2023 18:26   CT HEAD WO CONTRAST ( ) Result Date: 12/17/2023 CLINICAL DATA:  Hemorrhagic stroke follow-up EXAM: CT HEAD WITHOUT CONTRAST TECHNIQUE: Contiguous axial images were obtained from the base of the skull through the vertex without intravenous contrast. RADIATION DOSE REDUCTION: This exam was performed according to the departmental dose-optimization program which includes automated exposure control, adjustment of the mA and/or kV according to patient size and/or use of iterative reconstruction technique. COMPARISON:  Yesterday FINDINGS: Brain: Unchanged size and shape of the right ICH centered at the putamen and dissecting into adjacent white matter structures, up to 5.7 cm anterior to posterior at the dominant portion of the hematoma on sagittal reformats. Mild adjacent edema. Midline shift measures 6 mm. No entrapment or evidence of acute  infarction. Vascular:  No hyperdense vessel or unexpected calcification. Skull: Normal. Negative for fracture or focal lesion. Sinuses/Orbits: No acute finding. IMPRESSION: Unchanged right cerebral hematoma with 5 mm of midline shift. Electronically Signed   By: Ronnette Coke M.D.   On: 12/17/2023 06:07   CT HEAD WO CONTRAST ( ) Result Date: 12/16/2023 CLINICAL DATA:  Hemorrhagic stroke follow-up EXAM: CT HEAD WITHOUT CONTRAST TECHNIQUE: Contiguous axial images were obtained from the base of the skull through the vertex without intravenous contrast. RADIATION DOSE REDUCTION: This exam was performed according to the departmental dose-optimization program which includes automated exposure control, adjustment of the mA and/or kV according to patient size and/or use of iterative reconstruction technique. COMPARISON:  CTA from yesterday FINDINGS: Brain: Acute hematoma centered in the deep right brain, especially to be attainment has increased in size from initial noncontrast head CT but is stable from most recent CTA, 5.7 x 4.7 x 4.7 cm. Little adjacent edema. Leftward midline shift measures 6 mm near the foramina Shambaugh. No evidence of cortical infarct. Minimal intraventricular blood clot at the right lateral ventricle is unchanged. Vascular: No hyperdense vessel or unexpected calcification. Skull: Normal. Negative for fracture or focal lesion. Sinuses/Orbits: No acute finding IMPRESSION: Unchanged right cerebral hematoma when compared to CTA yesterday. 6 mm of midline shift. Electronically Signed   By: Ronnette Coke M.D.   On: 12/16/2023 08:34   CT HEAD CODE STROKE WO CONTRAST Result Date: 12/16/2023 CLINICAL DATA:  Code stroke. Follow-up examination for acute neuro deficit, stroke. EXAM: CT HEAD WITHOUT CONTRAST TECHNIQUE: Contiguous axial images were obtained from the base of the skull through the vertex without intravenous contrast. RADIATION DOSE REDUCTION: This exam was performed according to the  departmental dose-optimization program which includes automated exposure control, adjustment of the mA and/or kV according to patient size and/or use of iterative reconstruction technique. COMPARISON:  CT from earlier the same day. FINDINGS: Brain: Previously identified right cerebral hemorrhage again seen, now measuring 6.0 x 4.1 x 4.2 cm (estimated volume 52 mL, previously 35 mL). Surrounding edema with partial effacement of the right lateral ventricle. Associated 4 mm of right-to-left shift is now seen. Additionally, now seen is small volume hemorrhage within the right lateral ventricle, consistent with intraventricular extension. No other acute intracranial hemorrhage. No other acute large vessel territory infarct. No mass lesion. No extra-axial fluid collection. Vascular: Contrast material from prior CTA present within the intracranial circulation. Skull: No new finding. Sinuses/Orbits: No new finding. Other: None. IMPRESSION: 1. Interval increase in size of right cerebral hemorrhage, now measuring 6.0 x 4.1 x 4.2 cm (estimated volume 52 mL, previously 35 mL). Surrounding edema with partial effacement of the right lateral ventricle with progressive 4 mm of right-to-left shift. 2. Interval intraventricular extension with small volume hemorrhage within the right lateral ventricle. No hydrocephalus. Electronically Signed   By: Virgia Griffins M.D.   On: 12/16/2023 02:08   CT HEAD CODE STROKE WO CONTRAST Result Date: 12/16/2023 CLINICAL DATA:  Code stroke.  Acute neuro deficit, stroke. EXAM: CT HEAD WITHOUT CONTRAST CT ANGIOGRAPHY HEAD AND NECK TECHNIQUE: Multidetector CT imaging of the head and neck was performed using the standard protocol during bolus administration of intravenous contrast. Multiplanar CT image reconstructions and MIPs were obtained to evaluate the vascular anatomy. Carotid stenosis measurements (when applicable) are obtained utilizing NASCET criteria, using the distal internal carotid  diameter as the denominator. RADIATION DOSE REDUCTION: This exam was performed according to the departmental dose-optimization program which includes automated exposure control, adjustment of the mA  and/or kV according to patient size and/or use of iterative reconstruction technique. CONTRAST:  75mL OMNIPAQUE  IOHEXOL  350 MG/ML SOLN COMPARISON:  Study from 08/23/2012 FINDINGS: CT HEAD FINDINGS Brain: Acute intraparenchymal hemorrhage involving the right frontotemporal region measures 5.5 x 3.6 x 3.5 cm (estimated volume 35 mL). Localized edema with partial effacement of the right lateral ventricle. No hydrocephalus. No other acute intracranial hemorrhage or large vessel territory infarct. No mass lesion or extra-axial fluid collection. Vascular: No abnormal hyperdense vessel. Skull: Scalp soft tissues and calvarium grossly within normal limits. Sinuses/Orbits: Globes orbital soft tissues demonstrate no acute finding. Remote posttraumatic defect at the left lamina papyracea. Paranasal sinuses are largely clear. No significant mastoid effusion. Other: None. ASPECTS [facility] Stroke Program Early CT Score) Acute ICH, does not apply. CTA NECK FINDINGS Aortic arch: Standard branching. Imaged portion shows no evidence of aneurysm or dissection. No significant stenosis of the major arch vessel origins. Right carotid system: No evidence of dissection, stenosis (50% or greater), or occlusion. Left carotid system: No evidence of dissection, stenosis (50% or greater), or occlusion. Vertebral arteries: No evidence of dissection, stenosis (50% or greater), or occlusion. Skeleton: No worrisome osseous lesions. Other neck: No other acute finding. Upper chest: No other acute finding. Review of the MIP images confirms the above findings CTA HEAD FINDINGS Anterior circulation: Both internal carotid arteries are widely patent through the siphons without stenosis. A1 segments, anterior communicating artery complex, and anterior cerebral  arteries are widely patent. No M1 stenosis or occlusion. Distal MCA branches perfused and fairly symmetric. Posterior circulation: Both V4 segments patent without stenosis. Right vertebral artery dominant. Both PICA patent. Basilar widely patent. Superior cerebellar and posterior cerebral arteries patent bilaterally. Venous sinuses: Patent allowing for timing the contrast bolus. Anatomic variants: None significant. No intracranial aneurysm. Two adjacent foci of contrast enhancement measuring up to 9 mm seen within the bed of the right cerebral hematoma, consistent with active contrast extravasation/spot sign (series 6, image 336). No other underlying vascular abnormality. Review of the MIP images confirms the above findings IMPRESSION: CT HEAD: 5.5 x 3.6 x 3.5 cm (estimated volume 35 mL) acute intraparenchymal hemorrhage involving the right frontotemporal region. Localized edema without significant midline shift. CTA HEAD AND NECK: 1. Focus of contrast enhancement within the bed of the right cerebral hematoma, consistent with active contrast extravasation/spot sign. No other underlying vascular abnormality. 2. Otherwise negative CTA of the head and neck. No large vessel occlusion or other emergent finding. No hemodynamically significant or correctable stenosis. These results were communicated to Dr. Alecia Ames at 1:01 am on 12/16/2023 by text page via the [facility] messaging system. Electronically Signed   By: Virgia Griffins M.D.   On: 12/16/2023 01:40   CT ANGIO HEAD NECK W WO CM (CODE STROKE) Result Date: 12/16/2023 CLINICAL DATA:  Code stroke.  Acute neuro deficit, stroke. EXAM: CT HEAD WITHOUT CONTRAST CT ANGIOGRAPHY HEAD AND NECK TECHNIQUE: Multidetector CT imaging of the head and neck was performed using the standard protocol during bolus administration of intravenous contrast. Multiplanar CT image reconstructions and MIPs were obtained to evaluate the vascular anatomy. Carotid stenosis measurements (when  applicable) are obtained utilizing NASCET criteria, using the distal internal carotid diameter as the denominator. RADIATION DOSE REDUCTION: This exam was performed according to the departmental dose-optimization program which includes automated exposure control, adjustment of the mA and/or kV according to patient size and/or use of iterative reconstruction technique. CONTRAST:  75mL OMNIPAQUE  IOHEXOL  350 MG/ML SOLN COMPARISON:  Study from 08/23/2012 FINDINGS: CT  HEAD FINDINGS Brain: Acute intraparenchymal hemorrhage involving the right frontotemporal region measures 5.5 x 3.6 x 3.5 cm (estimated volume 35 mL). Localized edema with partial effacement of the right lateral ventricle. No hydrocephalus. No other acute intracranial hemorrhage or large vessel territory infarct. No mass lesion or extra-axial fluid collection. Vascular: No abnormal hyperdense vessel. Skull: Scalp soft tissues and calvarium grossly within normal limits. Sinuses/Orbits: Globes orbital soft tissues demonstrate no acute finding. Remote posttraumatic defect at the left lamina papyracea. Paranasal sinuses are largely clear. No significant mastoid effusion. Other: None. ASPECTS [facility] Stroke Program Early CT Score) Acute ICH, does not apply. CTA NECK FINDINGS Aortic arch: Standard branching. Imaged portion shows no evidence of aneurysm or dissection. No significant stenosis of the major arch vessel origins. Right carotid system: No evidence of dissection, stenosis (50% or greater), or occlusion. Left carotid system: No evidence of dissection, stenosis (50% or greater), or occlusion. Vertebral arteries: No evidence of dissection, stenosis (50% or greater), or occlusion. Skeleton: No worrisome osseous lesions. Other neck: No other acute finding. Upper chest: No other acute finding. Review of the MIP images confirms the above findings CTA HEAD FINDINGS Anterior circulation: Both internal carotid arteries are widely patent through the siphons  without stenosis. A1 segments, anterior communicating artery complex, and anterior cerebral arteries are widely patent. No M1 stenosis or occlusion. Distal MCA branches perfused and fairly symmetric. Posterior circulation: Both V4 segments patent without stenosis. Right vertebral artery dominant. Both PICA patent. Basilar widely patent. Superior cerebellar and posterior cerebral arteries patent bilaterally. Venous sinuses: Patent allowing for timing the contrast bolus. Anatomic variants: None significant. No intracranial aneurysm. Two adjacent foci of contrast enhancement measuring up to 9 mm seen within the bed of the right cerebral hematoma, consistent with active contrast extravasation/spot sign (series 6, image 336). No other underlying vascular abnormality. Review of the MIP images confirms the above findings IMPRESSION: CT HEAD: 5.5 x 3.6 x 3.5 cm (estimated volume 35 mL) acute intraparenchymal hemorrhage involving the right frontotemporal region. Localized edema without significant midline shift. CTA HEAD AND NECK: 1. Focus of contrast enhancement within the bed of the right cerebral hematoma, consistent with active contrast extravasation/spot sign. No other underlying vascular abnormality. 2. Otherwise negative CTA of the head and neck. No large vessel occlusion or other emergent finding. No hemodynamically significant or correctable stenosis. These results were communicated to Dr. Alecia Ames at 1:01 am on 12/16/2023 by text page via the [facility] messaging system. Electronically Signed   By: Virgia Griffins M.D.   On: 12/16/2023 01:40     Discharge Exam: Vitals:   12/30/23 0300 12/30/23 0732  BP: 104/72 (!) 135/96  Pulse: 78 81  Resp:  18  Temp: 98.2 F (36.8 C) 98.8 F (37.1 C)  SpO2: 99% 100%   Vitals:   12/29/23 1949 12/29/23 2323 12/30/23 0300 12/30/23 0732  BP: 103/69 109/74 104/72 (!) 135/96  Pulse: 90 79 78 81  Resp:    18  Temp: 98.6 F (37 C) 98.1 F (36.7 C) 98.2 F (36.8  C) 98.8 F (37.1 C)  TempSrc: Oral Oral Oral Oral  SpO2: 98% 98% 99% 100%  Weight:        General: Pt is alert, awake, not in acute distress Cardiovascular: RRR, S1/S2 +, no rubs, no gallops Respiratory: CTA bilaterally, no wheezing, no rhonchi Abdominal: Soft, NT, ND, bowel sounds + Extremities: no edema, no cyanosis    The results of significant diagnostics from this hospitalization (including imaging, microbiology, ancillary and  laboratory) are listed below for reference.     Microbiology: No results found for this or any previous visit (from the past 240 hours).   Labs: BNP (last 3 results) No results for input(s): "BNP" in the last 8760 hours. Basic Metabolic Panel: Recent Labs  Lab 12/24/23 0849 12/25/23 0625 12/26/23 0631 12/27/23 0519 12/28/23 0501 12/29/23 0642  NA 135 136 140 137 139 139  K 3.8 3.4* 3.9 4.1 3.9 4.0  CL 105 106 110 108 106 107  CO2 19* 20* 21* 19* 22 25  GLUCOSE 96 95 98 107* 108* 100*  BUN 12 16 16 15 15 15   CREATININE 0.71 0.68 0.70 0.67 0.64 0.67  CALCIUM  8.9 9.0 9.0 9.1 9.4 9.3  MG 2.1 2.2 2.3  --   --   --   PHOS 3.4 4.0 4.0  --   --   --    Liver Function Tests: Recent Labs  Lab 12/25/23 0625 12/26/23 0631 12/27/23 0519 12/28/23 0501 12/29/23 0642  AST 235* 185* 241* 212* 148*  ALT 372* 341* 429* 404* 340*  ALKPHOS 93 92 100 89 96  BILITOT 1.2 0.9 0.9 0.7 0.6  PROT 7.5 7.4 7.5 7.6 7.6  ALBUMIN 2.9* 3.0* 3.1* 3.1* 2.9*   No results for input(s): "LIPASE", "AMYLASE" in the last 168 hours. No results for input(s): "AMMONIA" in the last 168 hours. CBC: Recent Labs  Lab 12/25/23 0625 12/26/23 0631 12/27/23 0519 12/28/23 0501 12/29/23 0642  WBC 6.1 5.9 8.7 7.7 7.7  HGB 12.2* 12.2* 13.1 12.8* 12.7*  HCT 37.6* 38.2* 39.1 39.3 39.8  MCV 94.0 95.3 94.0 93.3 95.7  PLT 189 212 238 268 261   Cardiac Enzymes: No results for input(s): "CKTOTAL", "CKMB", "CKMBINDEX", "TROPONINI" in the last 168 hours. BNP: Invalid  input(s): "POCBNP" CBG: Recent Labs  Lab 12/23/23 1109 12/23/23 1517 12/23/23 2124  GLUCAP 110* 115* 142*   D-Dimer No results for input(s): "DDIMER" in the last 72 hours. Hgb A1c No results for input(s): "HGBA1C" in the last 72 hours. Lipid Profile No results for input(s): "CHOL", "HDL", "LDLCALC", "TRIG", "CHOLHDL", "LDLDIRECT" in the last 72 hours. Thyroid function studies No results for input(s): "TSH", "T4TOTAL", "T3FREE", "THYROIDAB" in the last 72 hours.  Invalid input(s): "FREET3" Anemia work up No results for input(s): "VITAMINB12", "FOLATE", "FERRITIN", "TIBC", "IRON ", "RETICCTPCT" in the last 72 hours. Urinalysis    Component Value Date/Time   COLORURINE YELLOW 12/17/2023 1723   APPEARANCEUR CLEAR 12/17/2023 1723   LABSPEC 1.012 12/17/2023 1723   PHURINE 7.0 12/17/2023 1723   GLUCOSEU NEGATIVE 12/17/2023 1723   HGBUR NEGATIVE 12/17/2023 1723   BILIRUBINUR NEGATIVE 12/17/2023 1723   KETONESUR 5 (A) 12/17/2023 1723   PROTEINUR 30 (A) 12/17/2023 1723   UROBILINOGEN 0.2 07/31/2012 1953   NITRITE NEGATIVE 12/17/2023 1723   LEUKOCYTESUR NEGATIVE 12/17/2023 1723   Sepsis Labs Recent Labs  Lab 12/26/23 0631 12/27/23 0519 12/28/23 0501 12/29/23 0642  WBC 5.9 8.7 7.7 7.7   Microbiology No results found for this or any previous visit (from the past 240 hours).   Time coordinating discharge: 35 minutes  SIGNED:   Cornelius Dill, DO [facility] 12/30/2023, 9:08 AM  If 7PM-7AM, please contact night-coverage [email or web address]

## 2023-12-30 NOTE — Plan of Care (Signed)
 Keeps asking about going home and has to be reminded that he is not going home, but to inpt rehab.    Problem: Education: Goal: Knowledge of disease or condition will improve Outcome: Adequate for Discharge Goal: Knowledge of secondary prevention will improve (MUST DOCUMENT ALL) Outcome: Adequate for Discharge Goal: Knowledge of patient specific risk factors will improve (DELETE if not current risk factor) Outcome: Adequate for Discharge   Problem: Intracerebral Hemorrhage Tissue Perfusion: Goal: Complications of Intracerebral Hemorrhage will be minimized Outcome: Adequate for Discharge   Problem: Coping: Goal: Will verbalize positive feelings about self Outcome: Adequate for Discharge Goal: Will identify appropriate support needs Outcome: Adequate for Discharge   Problem: Health Behavior/Discharge Planning: Goal: Ability to manage health-related needs will improve Outcome: Adequate for Discharge Goal: Goals will be collaboratively established with patient/family Outcome: Adequate for Discharge   Problem: Self-Care: Goal: Ability to participate in self-care as condition permits will improve Outcome: Adequate for Discharge Goal: Verbalization of feelings and concerns over difficulty with self-care will improve Outcome: Adequate for Discharge Goal: Ability to communicate needs accurately will improve Outcome: Adequate for Discharge   Problem: Nutrition: Goal: Risk of aspiration will decrease Outcome: Adequate for Discharge Goal: Dietary intake will improve Outcome: Adequate for Discharge   Problem: Education: Goal: Knowledge of General Education information will improve Description: Including pain rating scale, medication(s)/side effects and non-pharmacologic comfort measures Outcome: Adequate for Discharge   Problem: Health Behavior/Discharge Planning: Goal: Ability to manage health-related needs will improve Outcome: Adequate for Discharge   Problem: Clinical  Measurements: Goal: Ability to maintain clinical measurements within normal limits will improve Outcome: Adequate for Discharge Goal: Will remain free from infection Outcome: Adequate for Discharge Goal: Diagnostic test results will improve Outcome: Adequate for Discharge Goal: Respiratory complications will improve Outcome: Adequate for Discharge Goal: Cardiovascular complication will be avoided Outcome: Adequate for Discharge   Problem: Activity: Goal: Risk for activity intolerance will decrease Outcome: Adequate for Discharge   Problem: Nutrition: Goal: Adequate nutrition will be maintained Outcome: Adequate for Discharge   Problem: Coping: Goal: Level of anxiety will decrease Outcome: Adequate for Discharge   Problem: Elimination: Goal: Will not experience complications related to bowel motility Outcome: Adequate for Discharge Goal: Will not experience complications related to urinary retention Outcome: Adequate for Discharge   Problem: Pain Managment: Goal: General experience of comfort will improve and/or be controlled Outcome: Adequate for Discharge   Problem: Safety: Goal: Ability to remain free from injury will improve Outcome: Adequate for Discharge   Problem: Skin Integrity: Goal: Risk for impaired skin integrity will decrease Outcome: Adequate for Discharge   Problem: Safety: Goal: Non-violent Restraint(s) Outcome: Adequate for Discharge

## 2023-12-31 DIAGNOSIS — I10 Essential (primary) hypertension: Secondary | ICD-10-CM | POA: Diagnosis not present

## 2023-12-31 DIAGNOSIS — I619 Nontraumatic intracerebral hemorrhage, unspecified: Secondary | ICD-10-CM | POA: Diagnosis not present

## 2023-12-31 DIAGNOSIS — M25552 Pain in left hip: Secondary | ICD-10-CM

## 2023-12-31 DIAGNOSIS — K5901 Slow transit constipation: Secondary | ICD-10-CM

## 2023-12-31 DIAGNOSIS — E785 Hyperlipidemia, unspecified: Secondary | ICD-10-CM

## 2023-12-31 DIAGNOSIS — D62 Acute posthemorrhagic anemia: Secondary | ICD-10-CM

## 2023-12-31 MED ORDER — POLYETHYLENE GLYCOL 3350 17 G PO PACK: 17.0000 g | PACK | Freq: Every day | ORAL | Status: DC | PRN

## 2023-12-31 MED ORDER — MELATONIN 5 MG PO TABS
5.0000 mg | ORAL_TABLET | Freq: Every evening | ORAL | Status: DC | PRN
  Administered 2024-01-01: 5 mg via ORAL
  Filled 2023-12-31 (×2): qty 1

## 2023-12-31 MED ORDER — OXYCODONE HCL 5 MG PO TABS
2.5000 mg | ORAL_TABLET | Freq: Four times a day (QID) | ORAL | Status: DC | PRN
  Administered 2023-12-31 – 2024-01-09 (×9): 2.5 mg via ORAL
  Filled 2023-12-31 (×10): qty 1

## 2023-12-31 MED ORDER — ALUM & MAG HYDROXIDE-SIMETH 200-200-20 MG/5ML PO SUSP: 30.0000 mL | ORAL | Status: DC | PRN

## 2023-12-31 MED ORDER — GUAIFENESIN-DM 100-10 MG/5ML PO SYRP
10.0000 mL | ORAL_SOLUTION | Freq: Four times a day (QID) | ORAL | Status: DC | PRN
  Filled 2023-12-31: qty 10

## 2023-12-31 NOTE — Evaluation (Signed)
 Speech Language Pathology Assessment and Plan  Patient Details  Name: Troy Bishop MRN: [id number] Date of Birth: [birth year]  SLP Diagnosis: Dysarthria;Cognitive Impairments;Dysphagia  Rehab Potential: Good ELOS: 2.5-3 weeks    Today's Date: 12/31/2023 SLP Individual Time: 1000-1105 SLP Individual Time Calculation (min): 65 min   Hospital Problem: Principal Problem:   ICH (intracerebral hemorrhage) (HCC)  Past Medical History:  Past Medical History:  Diagnosis Date   Liver disease    Painful orthopaedic hardware (HCC) 07/2017   right tibia   Verruca vulgaris 2023   penile mass   Past Surgical History:  Past Surgical History:  Procedure Laterality Date   BREATH TEK H PYLORI N/A 09/08/2016   Procedure: BREATH TEK [name];  Surgeon: [name], MD;  Location: [facility];  Service: Gastroenterology;  Laterality: N/A;   HARDWARE REMOVAL Left 07/27/2017   Procedure: Removal of deep implants right proximal and distal tibia;  Surgeon: Amada Backer, MD;  Location: [facility];  Service: Orthopedics;  Laterality: Left;   TENDON REPAIR Left 08/14/2014   Procedure: LEFT HAND WOUND EXPLORATION AND TENDON REPAIR;  Surgeon: Shellie Dials, MD;  Location: [facility];  Service: Orthopedics;  Laterality: Left;   TIBIA IM NAIL INSERTION  07/31/2012   Procedure: INTRAMEDULLARY (IM) NAIL TIBIAL;  Surgeon: Amada Backer, MD;  Location: [facility];  Service: Orthopedics;  Laterality: Left;   UPPER GI ENDOSCOPY  07/25/2016    Assessment / Plan / Recommendation Clinical Impression  Pt is a 33 y.o. male s/p intracerebral hemorrhage with PMHx significant for ETOH abuse, uncontrolled HTN, single previous seizure, and cirrhosis of liver due to ETOH abuse. He presented to the hospital on 12/16/23 with left sided weakness. CT showed a large subcortical hemorrhage on the left. MRI brain showed a massive intracranial hemorrhage centered in right basal ganglia and extending into the  anterior right temporal lobe.   Cog/Ling: Pt works as a Designer, industrial/product and his highest level of education is 8th grade. He is married with 4 children. During this evaluation, he frequently was perseverating on returning to work tomorrow and wanting to get exercise so he can return to work "on Monday". He states he is motivated to return to PLOF. He displayed poor reasoning and judgement of his physical and cognitive deficits as well as poor understanding of information presented by his nurse regarding his care. He was not noted to be physically impulsive during the evaluation but was impulsive during PO trials (see "Swallowing" section below). The SLUMS was administered with a score of 5/30 [facility] for the pt's education level is 25+). He was oriented to place, situation, and month/year. 3/5 for immediate recall of 5 objects, reduced to 0/5 with delay recall of approx. 5 minutes. Generatively named 10 animals within 1 minute time constraint. 33% acc for simple math and 0% acc for working memory number reversal task. 0% acc for clock drawing task but no noted visual neglect and hour markers were close to correct. 0% for follow simple directions IND (increased to 100% when provided repetition but could not be counted). 0% acc for comprehension questions of paragraph information. Pt benefits from frequent repetition of instructions, simple language, and direct instruction.   Pt presents with severe cognitive-linguistic deficits in STM, working memory, sustained attention, verbal reasoning, and judgement. Skilled ST warranted to increase awareness to promote safety, increase sustained attention, and increase short-term and working memory.   Swallowing: A bedside swallow evaluation was completed to assess  oropharyngel function and risk for aspiration. Pt's current diet is Dys3/thin. Baseline diet is reg/thin. Pt denies odynophagia, hx of PNA, globus sensation, difficulty with certain foods/liquids, or  difficulty with mastication. PO trials of regular graham cracker, Dys3 fig newton, and Dys1 applesauce (administered via spoon by Nursing) with thin liquids of water and Ensure (via cup/straw) were conducted. Pt able to feed himself when provided set up A. No overt s/sx of aspiration observed across trials despite thorough challenging as pt was impulsive and took large bites/sips when not cued. Anterior spillage from L side observed with thin liquids and puree, improved when straw/utensil placed on R side.   Pt presents with oral phase dysphagia characterized by anterior spillage, mild lingual oral residue, and mild pocketing in L buccal cavity. Pt is at a higher risk of aspiration/choking d/t cognitive deficits that cause him to overfill his mouth and have difficulty modifying bolus size to a safe amount. Rec continue Dys 3/thin diet with meds whole in puree. Provide full supervision with frequent cues (to account for memory deficits) for small bites/sips and to use a slow rate. Pt may require feeding asst if he is not able to respond to cues. Family was educated on these strategies and able to demonstrate understanding while feeding him. Skilled ST warranted to reinforce safe swallow strategies, provide pt/family education, and determine LRD.  Speech: Pt presents with dysarthria characterized by reduced speech intelligibility, reduced vocal intensity, and imprecise articulation. His speech in conversation was estimated to be approx. 50-70% depending on the context and environmental factors. Skilled ST warranted for speech intelligibility strategies to increase his ability to express his wants/needs effectively.    Skilled Therapeutic Interventions          A BSE and speech/lang evaluation were conducted. Please see above.  SLP Assessment  Patient will need skilled Speech Language Pathology Services during CIR admission    Recommendations  SLP Diet Recommendations: Dysphagia 3 (Mech soft);Thin Liquid  Administration via: Spoon;Cup;Straw (on R side) Medication Administration: Whole meds with puree Supervision: Full supervision/cueing for compensatory strategies Compensations: Slow rate;Small sips/bites;Minimize environmental distractions;Lingual sweep for clearance of pocketing;Monitor for anterior loss Postural Changes and/or Swallow Maneuvers: Seated upright 90 degrees;Upright 30-60 min after meal Oral Care Recommendations: Oral care BID Patient destination: Home Follow up Recommendations: Outpatient SLP;Home Health SLP    SLP Frequency 3 to 5 out of 7 days   SLP Duration  SLP Intensity  SLP Treatment/Interventions 2.5-3 weeks  Minumum of 1-2 x/day, 30 to 90 minutes  Therapeutic Activities;Therapeutic Exercise;Internal/external aids;Dysphagia/aspiration precaution training;Patient/family education;Cognitive remediation/compensation;Environmental controls;Cueing hierarchy;Functional tasks    Pain Pain Assessment Pain Scale: 0-10 Pain Score: 5  Pain Type: Acute pain Pain Location: Hip Pain Orientation: Left Pain Descriptors / Indicators: Aching Pain Onset: With Activity Pain Intervention(s): Repositioned  Prior Functioning Cognitive/Linguistic Baseline: Within functional limits Type of Home: House  Lives With: Spouse;Family Available Help at Discharge: Available 24 hours/day Education: 8th grade Vocation: Full time employment  SLP Evaluation Cognition Overall Cognitive Status: Impaired/Different from baseline (unsure of language deficits) Arousal/Alertness: Awake/alert Orientation Level: Oriented to person;Oriented to place;Oriented to situation Year: 2025 Month: May Day of Week: Incorrect Attention: Sustained Sustained Attention: Impaired Sustained Attention Impairment: Verbal basic;Functional basic Memory: Impaired Memory Impairment: Decreased short term memory;Decreased recall of new information Decreased Short Term Memory: Verbal basic;Functional  basic Awareness: Impaired Awareness Impairment: Intellectual impairment Problem Solving: Impaired Problem Solving Impairment: Functional basic;Verbal basic Executive Function: Reasoning;Self Monitoring;Decision Making Reasoning: Impaired Reasoning Impairment: Verbal basic;Functional basic Decision  Making: Impaired Decision Making Impairment: Verbal basic;Functional basic Self Monitoring: Impaired Self Monitoring Impairment: Functional basic Behaviors: Restless;Impulsive;Perseveration Safety/Judgment: Impaired (impulsive.)  Comprehension Auditory Comprehension Overall Auditory Comprehension: Impaired Yes/No Questions: Within Functional Limits Commands: Not tested;Impaired One Step Basic Commands: 25-49% accurate Conversation: Simple Interfering Components: Attention;Processing speed;Working memory EffectiveTechniques: Repetition;Extra processing time Visual Recognition/Discrimination Discrimination: Not tested Reading Comprehension Reading Status: Not tested Expression Expression Primary Mode of Expression: Verbal Verbal Expression Overall Verbal Expression: Impaired Initiation: No impairment Naming: No impairment Pragmatics: Impairment Impairments: Topic maintenance;Interpretation of nonverbal communication Interfering Components: Attention;Speech intelligibility Effective Techniques: Other (Comment) (frequent repetition of information, teach back) Non-Verbal Means of Communication: Not applicable Written Expression Dominant Hand: Right Written Expression: Not tested Oral Motor Oral Motor/Sensory Function Overall Oral Motor/Sensory Function: Moderate impairment Facial ROM: Reduced left;Suspected CN VII (facial) dysfunction Facial Symmetry: Abnormal symmetry left;Suspected CN VII (facial) dysfunction Facial Strength: Reduced left;Suspected CN VII (facial) dysfunction Facial Sensation: Reduced left;Suspected CN V (Trigeminal) dysfunction Lingual ROM: Reduced  left;Suspected CN XII (hypoglossal) dysfunction Lingual Symmetry: Abnormal symmetry left;Suspected CN XII (hypoglossal) dysfunction Mandible: Within Functional Limits Motor Speech Overall Motor Speech: Impaired Respiration: Within functional limits Phonation: Normal Intelligibility: Intelligibility reduced Word: 75-100% accurate Phrase: 50-74% accurate Sentence: 50-74% accurate Conversation: 50-74% accurate  Care Tool Care Tool Cognition Ability to hear (with hearing aid or hearing appliances if normally used Ability to hear (with hearing aid or hearing appliances if normally used): 0. Adequate - no difficulty in normal conservation, social interaction, listening to TV   Expression of Ideas and Wants Expression of Ideas and Wants: 3. Some difficulty - exhibits some difficulty with expressing needs and ideas (e.g, some words or finishing thoughts) or speech is not clear   Understanding Verbal and Non-Verbal Content Understanding Verbal and Non-Verbal Content: 3. Usually understands - understands most conversations, but misses some part/intent of message. Requires cues at times to understand  Memory/Recall Ability Memory/Recall Ability : That he or she is in a hospital/hospital unit   Intelligibility: Intelligibility reduced Word: 75-100% accurate Phrase: 50-74% accurate Sentence: 50-74% accurate Conversation: 50-74% accurate  Bedside Swallowing Assessment General Date of Onset: 12/16/23 Previous Swallow Assessment: none found Diet Prior to this Study: Dysphagia 3 (mechanical soft);Thin liquids (Level 0) Temperature Spikes Noted: No Respiratory Status: Room air History of Recent Intubation: No Behavior/Cognition: Alert;Cooperative;Pleasant mood Oral Cavity - Dentition: Adequate natural dentition Self-Feeding Abilities: Able to feed self Vision: Functional for self-feeding Patient Positioning: Upright in bed Baseline Vocal Quality: Normal Volitional Cough: Strong Volitional  Swallow: Able to elicit  Oral Care Assessment Oral Assessment  (WDL): Exceptions to WDL Lips: Asymmetrical Teeth: Intact Tongue: Pink;Moist Mucous Membrane(s): Pink;Moist Saliva: Moist, saliva free flowing Level of Consciousness: Alert Is patient on any of following O2 devices?: None of the above Nutritional status: No high risk factors Oral Assessment Risk : Low Risk Ice Chips Ice chips: Not tested Thin Liquid Thin Liquid: Impaired Presentation: Cup;Self Fed;Straw Oral Phase Impairments: Reduced labial seal Oral Phase Functional Implications: Left anterior spillage Nectar Thick Nectar Thick Liquid: Not tested Honey Thick Honey Thick Liquid: Not tested Puree Puree: Within functional limits Presentation: Spoon Solid Solid: Impaired Presentation: Self Fed Oral Phase Impairments: Poor awareness of bolus Oral Phase Functional Implications: Oral residue BSE Assessment Risk for Aspiration Impact on safety and function: Mild aspiration risk Other Related Risk Factors: Cognitive impairment  Short Term Goals: Week 1: SLP Short Term Goal 1 (Week 1): Pt will use safe swallow strategies (small bites/sips, slow rate, lingual sweep) when consuming Dys 3/thin  diet to reduce the risk of aspiration/choking. SLP Short Term Goal 2 (Week 1): Pt will use compensatory memory strategies to increase recall of functional information up to 3 minutes when provided verbal cueing and external memory aids. SLP Short Term Goal 3 (Week 1): Pt will name at least 1 cognitive/physical change and how it will functionally impact him to demonstrate increased awareness of deficits and increase reasoning/safety. SLP Short Term Goal 4 (Week 1): Pt will complete verbal safety/reasoning tasks with 60% acc when provided verbal cueing. SLP Short Term Goal 5 (Week 1): Pt will increase speech intelligibility at the sentence level to 80% using compensatory strategies when provided fading verbal cueing.  Refer to Care  Plan for Long Term Goals  Recommendations for other services: None   Discharge Criteria: Patient will be discharged from SLP if patient refuses treatment 3 consecutive times without medical reason, if treatment goals not met, if there is a change in medical status, if patient makes no progress towards goals or if patient is discharged from hospital.  The above assessment, treatment plan, treatment alternatives and goals were discussed and mutually agreed upon: by patient and by family  Caretha Chapel 12/31/2023, 3:54 PM

## 2023-12-31 NOTE — Plan of Care (Signed)
 Problem: RH Balance Goal: LTG: Patient will maintain dynamic sitting balance (OT) Description: LTG:  Patient will maintain dynamic sitting balance with assistance during activities of daily living (OT) Flowsheets (Taken 12/31/2023 1240) LTG: Pt will maintain dynamic sitting balance during ADLs with: Supervision/Verbal cueing Goal: LTG Patient will maintain dynamic standing with ADLs (OT) Description: LTG:  Patient will maintain dynamic standing balance with assist during activities of daily living (OT)  Flowsheets (Taken 12/31/2023 1240) LTG: Pt will maintain dynamic standing balance during ADLs with: Minimal Assistance - Patient > 75%   Problem: Sit to Stand Goal: LTG:  Patient will perform sit to stand in prep for activites of daily living with assistance level (OT) Description: LTG:  Patient will perform sit to stand in prep for activites of daily living with assistance level (OT) Flowsheets (Taken 12/31/2023 1240) LTG: PT will perform sit to stand in prep for activites of daily living with assistance level: Contact Guard/Touching assist   Problem: RH Eating Goal: LTG Patient will perform eating w/assist, cues/equip (OT) Description: LTG: Patient will perform eating with assist, with/without cues using equipment (OT) Flowsheets (Taken 12/31/2023 1240) LTG: Pt will perform eating with assistance level of: Independent with assistive device    Problem: RH Grooming Goal: LTG Patient will perform grooming w/assist,cues/equip (OT) Description: LTG: Patient will perform grooming with assist, with/without cues using equipment (OT) Flowsheets (Taken 12/31/2023 1240) LTG: Pt will perform grooming with assistance level of: Set up assist    Problem: RH Bathing Goal: LTG Patient will bathe all body parts with assist levels (OT) Description: LTG: Patient will bathe all body parts with assist levels (OT) Flowsheets (Taken 12/31/2023 1240) LTG: Pt will perform bathing with assistance level/cueing:  Contact Guard/Touching assist   Problem: RH Dressing Goal: LTG Patient will perform upper body dressing (OT) Description: LTG Patient will perform upper body dressing with assist, with/without cues (OT). Flowsheets (Taken 12/31/2023 1240) LTG: Pt will perform upper body dressing with assistance level of: Supervision/Verbal cueing Goal: LTG Patient will perform lower body dressing w/assist (OT) Description: LTG: Patient will perform lower body dressing with assist, with/without cues in positioning using equipment (OT) Flowsheets (Taken 12/31/2023 1240) LTG: Pt will perform lower body dressing with assistance level of: Contact Guard/Touching assist   Problem: RH Toileting Goal: LTG Patient will perform toileting task (3/3 steps) with assistance level (OT) Description: LTG: Patient will perform toileting task (3/3 steps) with assistance level (OT)  Flowsheets (Taken 12/31/2023 1240) LTG: Pt will perform toileting task (3/3 steps) with assistance level: Minimal Assistance - Patient > 75%   Problem: RH Vision Goal: RH LTG Vision Consulting civil engineer) Flowsheets (Taken 12/31/2023 1240) LTG: Vision Goals: Pt will attend to left body and field in ADL with min cues   Problem: RH Functional Use of Upper Extremity Goal: LTG Patient will use RT/LT upper extremity as a (OT) Description: LTG: Patient will use right/left upper extremity as a stabilizer/gross assist/diminished/nondominant/dominant level with assist, with/without cues during functional activity (OT) Flowsheets (Taken 12/31/2023 1240) LTG: Use of upper extremity in functional activities: LUE as a stabilizer LTG: Pt will use upper extremity in functional activity with assistance level of: Minimal Assistance - Patient > 75%   Problem: RH Toilet Transfers Goal: LTG Patient will perform toilet transfers w/assist (OT) Description: LTG: Patient will perform toilet transfers with assist, with/without cues using equipment (OT) Flowsheets (Taken 12/31/2023  1240) LTG: Pt will perform toilet transfers with assistance level of: Contact Guard/Touching assist   Problem: RH Tub/Shower Transfers Goal: LTG Patient  will perform tub/shower transfers w/assist (OT) Description: LTG: Patient will perform tub/shower transfers with assist, with/without cues using equipment (OT) Flowsheets (Taken 12/31/2023 1240) LTG: Pt will perform tub/shower stall transfers with assistance level of: Minimal Assistance - Patient > 75%   Problem: RH Memory Goal: LTG Patient will demonstrate ability for day to day recall/carry over during activities of daily living with assistance level (OT) Description: LTG:  Patient will demonstrate ability for day to day recall/carry over during activities of daily living with assistance level (OT). Flowsheets (Taken 12/31/2023 1240) LTG:  Patient will demonstrate ability for day to day recall/carry over during activities of daily living with assistance level (OT): Supervision   Problem: RH Attention Goal: LTG Patient will demonstrate this level of attention during functional activites (OT) Description: LTG:  Patient will demonstrate this level of attention during functional activites  (OT) Flowsheets (Taken 12/31/2023 1240) Patient will demonstrate this level of attention during functional activites: Selective Patient will demonstrate above attention level in the following environment: Home LTG: Patient will demonstrate this level of attention during functional activites (OT): Minimal Assistance - Patient > 75%   Problem: RH Awareness Goal: LTG: Patient will demonstrate awareness during functional activites type of (OT) Description: LTG: Patient will demonstrate awareness during functional activites type of (OT) Flowsheets (Taken 12/31/2023 1240) Patient will demonstrate awareness during functional activites type of: Emergent LTG: Patient will demonstrate awareness during functional activites type of (OT): Minimal Assistance - Patient >  75%

## 2023-12-31 NOTE — Progress Notes (Signed)
 Inpatient Rehabilitation  Medication Review by a Pharmacist  A complete drug regimen review was completed for this patient to identify any potential clinically significant medication issues.  High Risk Drug Classes Is patient taking? Indication by Medication  Antipsychotic Yes Quetiapine - mood /behavior  Anticoagulant Yes Heparin  SQ -VTE propylaxis  Antibiotic No   Opioid Yes Oxycodone  - pain  Antiplatelet No   Hypoglycemics/insulin No   Vasoactive Medication Yes, po and IV (prn) Amlodipine , coreg , losartan  - HTN  Hydralazine  IV -prn high blood pressure Labetalol  IV-blood pressure and heart rate control  Chemotherapy No   Other Yes Acetaminophen , gabapentin- pain management Lactulose - metabolic encephalopathy/ETOH withdrawal  Lorazepam - anxiety Protonix - GERD Nicotine  patch- h/o tobacco use Miralax - constipation Thiamine , folic acid , multivitamin - supplements     Type of Medication Issue Identified Description of Issue Recommendation(s)  Drug Interaction(s) (clinically significant)     Duplicate Therapy     Allergy     No Medication Administration End Date     Incorrect Dose     Additional Drug Therapy Needed     Significant med changes from prior encounter (inform family/care partners about these prior to discharge).    Other       Clinically significant medication issues were identified that warrant physician communication and completion of prescribed/recommended actions by midnight of the next day:  No  Name of provider notified for urgent issues identified:   Provider Method of Notification:    Pharmacist comments:   Time spent performing this drug regimen review (minutes):  15   Alisa Irish, [place] Clinical Pharmacist 12/31/2023 1:55 PM

## 2023-12-31 NOTE — Progress Notes (Signed)
 PROGRESS NOTE   Subjective/Complaints:  Pt doing well this morning, slept well. States he's having pain in his bottom or maybe L hip? Discussed turning frequently. States tylenol  doesn't help, will add on something stronger but encouraged pt to still use tylenol .  LBM last night (incontinent).  Urinating ok. No other complaints or concerns today.   ROS: as per HPI. Denies CP, SOB, abd pain, N/V/D/C, or any other complaints at this time although pt is a questionable historian.   Objective:   No results found. Recent Labs    12/29/23 0642  WBC 7.7  HGB 12.7*  HCT 39.8  PLT 261   Recent Labs    12/29/23 0642  NA 139  K 4.0  CL 107  CO2 25  GLUCOSE 100*  BUN 15  CREATININE 0.67  CALCIUM  9.3        Intake/Output Summary (Last 24 hours) at 12/31/2023 1140 Last data filed at 12/31/2023 0838 Gross per 24 hour  Intake 240 ml  Output 575 ml  Net -335 ml        Physical Exam: Vital Signs Blood pressure 120/81, pulse 77, temperature 98.1 F (36.7 C), resp. rate 18, height 5\' 7"  (1.702 m), weight 62.8 kg, SpO2 100%.  Gen: no distress, sitting in w/c HEENT: oral mucosa pink and moist, NCAT, L facial droop Cardio: Reg rate and rhythm, no m/r/g appreciated Chest: normal effort, normal rate of breathing, CTAB Abd: soft, non-distended, nonTTP, +BS throughout Ext: no edema Psych: pleasant, flat affect Skin: intact over exposed surfaces Neuro: impaired attention, internally distracted,  MsK: L sided weakness  PRIOR EXAMS: Neuro: Alert and oriented x3, impaired attention, impulsive, restless Musculoskeletal: LLE 0/5 strength, LUE 0/5 except he can move fingers, right sided strength is intact, decreased sensation throughout left side  Assessment/Plan: 1. Functional deficits which require 3+ hours per day of interdisciplinary therapy in a comprehensive inpatient rehab setting. Physiatrist is providing close team  supervision and 24 hour management of active medical problems listed below. Physiatrist and rehab team continue to assess barriers to discharge/monitor patient progress toward functional and medical goals  Care Tool:  Bathing              Bathing assist       Upper Body Dressing/Undressing Upper body dressing        Upper body assist      Lower Body Dressing/Undressing Lower body dressing            Lower body assist       Toileting Toileting    Toileting assist       Transfers Chair/bed transfer  Transfers assist           Locomotion Ambulation   Ambulation assist              Walk 10 feet activity   Assist           Walk 50 feet activity   Assist           Walk 150 feet activity   Assist           Walk 10 feet on uneven surface  activity   Assist  Wheelchair     Assist               Wheelchair 50 feet with 2 turns activity    Assist            Wheelchair 150 feet activity     Assist          Blood pressure 120/81, pulse 77, temperature 98.1 F (36.7 C), resp. rate 18, height 5\' 7"  (1.702 m), weight 62.8 kg, SpO2 100%.  Medical Problem List and Plan: 1. Functional deficits secondary to right basal ganglia large ICH             -patient may shower             -ELOS/Goals: 12-16 days S             -12/31/23 CIR evals today 2.  Antithrombotics: -DVT/anticoagulation:  Pharmaceutical: SCDs, heparin  5000U q8h             -antiplatelet therapy: N/A  3. Pain Management: Tylenol  prn. Continue gabapentin 200mg  BID -12/31/23 pt c/o L hip/sacral pain, Xray on 12/28/23 negative, will have nursing look for sacral ulcer, added Oxycodone  2.5mg  q6h PRN but encouraged use of tylenol  as well. Looks like they used tramadol  once before he came here.    4. Mood/Behavior/Sleep: LCSW to follow for evaluation and support.              -antipsychotic agents: N/A  -Seroquel  50mg  BID, Ativan   0.5mg  q6h PRN, melatonin 5mg  PRN  5. Neuropsych/cognition: This patient is not capable of making decisions on his own behalf.  6. Skin/Wound Care: Routine pressure relief measures.   7. Fluids/Electrolytes/Nutrition: Monitor I/O. Monitor routine labs. Continue vitamins/supplements.  -Dys 3 diet with thins (SLP to eval)   8. Metabolic encephalopathy/ETOH withdrawal: Has resolved, wean seroquel  and ativan  as tolerated. Continue lactulose  30g BID   9. Abnormal LFTs:  AST/ALT trending down. Continue to monitor weekly   10. ABLA?: Hgb 12's, monitor Hgb weekly   11. HTN: no meds PTA, started on amlodipine  10mg  daily, coreg  25mg  BID, losartan  100mg  daily, hydralazine  PRN, labetalol  PRN. SBP goal <160  -12/31/23 BPs great, monitor on current regimen with increased mobility  Vitals:   12/30/23 1748 12/30/23 2058 12/30/23 2235 12/31/23 0556  BP: 119/70 123/78 123/78 120/81      12. Tobacco abuse: continue nicoderm patches.   13. GI ppx: continue Protonix  40mg  daily  14. Bowel management: continue Miralax  daily  -12/31/23 LBM last night, incontinent; monitor for now  15. HLD: LDL 124, goal LDL <70, per d/c summary, start atorvastatin  40mg  once LFTs downtrend and repeat in 1 week -12/31/23 ordered CMP for tomorrow morning, if LFTs downtrended, consider starting atorvastatin .   LOS: 1 days A FACE TO FACE EVALUATION WAS PERFORMED  [address] 12/31/2023, 11:40 AM

## 2023-12-31 NOTE — Plan of Care (Signed)
  Problem: Consults Goal: RH STROKE PATIENT EDUCATION Description: See Patient Education module for education specifics  12/31/2023 0544 by Yolande Hench, RN Outcome: Progressing 12/31/2023 0544 by Yolande Hench, RN Outcome: Progressing

## 2023-12-31 NOTE — Evaluation (Signed)
 Physical Therapy Assessment and Plan  Patient Details  Name: Troy Bishop MRN: [id number] Date of Birth: 12-Sep-1990  PT Diagnosis: Abnormal posture, Coordination disorder, Hemiplegia non-dominant, Impaired cognition, Impaired sensation, Muscle weakness, and Pain in L hip Rehab Potential: Fair ELOS: 3 weeks.   Today's Date: 12/31/2023 PT Individual Time: 1300-1415 PT Individual Time Calculation (min): 75 min    Hospital Problem: Principal Problem:   ICH (intracerebral hemorrhage) (HCC)   Past Medical History:  Past Medical History:  Diagnosis Date   Liver disease    Painful orthopaedic hardware (HCC) 07/2017   right tibia   Verruca vulgaris 2023   penile mass   Past Surgical History:  Past Surgical History:  Procedure Laterality Date   BREATH TEK H PYLORI N/A 09/08/2016   Procedure: BREATH TEK [name];  Surgeon: [name], MD;  Location: [facility];  Service: Gastroenterology;  Laterality: N/A;   HARDWARE REMOVAL Left 07/27/2017   Procedure: Removal of deep implants right proximal and distal tibia;  Surgeon: Amada Backer, MD;  Location:  SUR[facility]ERY C[facility];  Service: Orthopedics;  Laterality: Left;   TENDON REPAIR Left 08/14/2014   Procedure: LEFT HAND WOUND EXPLORATION AND TENDON REPAIR;  Surgeon: Shellie Dials, MD;  Location: [facility] [place];  Service: Orthopedics;  Laterality: Left;   TIBIA IM NAIL INSERTION  07/31/2012   Procedure: INTRAMEDULLARY (IM) NAIL TIBIAL;  Surgeon: Amada Backer, MD;  Location: [facility];  Service: Orthopedics;  Laterality: Left;   UPPER GI ENDOSCOPY  07/25/2016    Assessment & Plan Clinical Impression: Troy Bishop is a 33 year old male with history of  uncontrolled HTN, thrombocytopenia, cirrhosis of liver due to ETOH abuse (Dr. [name]), ETOH withdrawal seizures who was admitted on 12/16/23 with acute onset of left sided weakness with lethargy. UDS showed ETOH level 264He was found to have acute large right cerebral hematoma with  active extravasation/spot sign and no LVO or emergent finding. Repeat CT head with increase in size of hemorrhage and was transfused with 2 units FFP by neurology. Dr. [name] consulted and recommended follow up CT head for monitoring. Tachycardia with agitation due to alcohol withdrawal treated with phenobarb taper.   He was started on hypertonic saline, required Cleviprex  for BP control and precedex  due to ongoing agitation. Serial CT head showed stable bleed. MRI brain showed stable hematoma centered in right basal ganglia extending into anterior right temporal love with extensive edema and 5 mm leftward shift and no abnormal contrast enhancement.  Dr. [name] felt that bleed likely hypertensive in nature.    Medications added for blood pressure control and cleveprex has been weaned off. Thrombocytopenia has been stable.  BLE dopplers  negative for DVT. Abnormal LFTs have been monitored and remain elevated.  He has had issues with left hip pain and X rays done negative for fracture or contusion. LUE ultrasound done due to pain and redness and showed evidence of superficial vein thrombosis left cephalic vein. Low grade fevers have resolved. Mentation and activity tolerance have improved. He requires +2 mod to max assist for standing with max cues for posture and max to total assist with cues for ADLs. He was independent PTA and CIR recommended due to functional decline.  Patient currently requires total with mobility secondary to muscle paralysis and impaired timing and sequencing, decreased coordination, and decreased motor planning.  Prior to hospitalization, patient was independent  with mobility and lived with Spouse, Family in a House home.  Home access is 1Stairs  to enter.  Patient will benefit from skilled PT intervention to maximize safe functional mobility, minimize fall risk, and decrease caregiver burden for planned discharge home with 24 hour supervision.  Anticipate patient will benefit from follow  up HH at discharge.  PT - End of Session Activity Tolerance: Tolerates 10 - 20 min activity with multiple rests Endurance Deficit: Yes PT Assessment Rehab Potential (ACUTE/IP ONLY): Fair PT Barriers to Discharge: Other (comments) PT Barriers to Discharge Comments: cognitive/language deficits PT Patient demonstrates impairments in the following area(s): Balance;Safety;Sensory;Endurance;Motor PT Transfers Functional Problem(s): Bed Mobility;Bed to Chair;Car;Furniture PT Locomotion Functional Problem(s): Ambulation;Stairs;Wheelchair Mobility PT Plan PT Intensity: Minimum of 1-2 x/day ,45 to 90 minutes PT Frequency: 5 out of 7 days PT Duration Estimated Length of Stay: 3 weeks. PT Treatment/Interventions: Ambulation/gait training;Community reintegration;Neuromuscular re-education;UE/LE Strength taining/ROM;Wheelchair propulsion/positioning;UE/LE Coordination activities;Therapeutic Activities;Balance/vestibular training;Functional electrical stimulation;Functional mobility training;Therapeutic Exercise;Patient/family education PT Transfers Anticipated Outcome(s): supervision PT Locomotion Anticipated Outcome(s): min A w/ LRAD PT Recommendation Follow Up Recommendations: Home health PT Patient destination: Home Equipment Recommended: To be determined Equipment Details: Pt has no equipment PTA.   PT Evaluation Precautions/Restrictions Precautions Precautions: Fall Precaution/Restrictions Comments: L hemi, neglect Restrictions Weight Bearing Restrictions Per Provider Order: No General Chart Reviewed: Yes Family/Caregiver Present: Yes Vital SignsTherapy Vitals Temp: 97.6 F (36.4 C) Temp Source: Oral Pulse Rate: 79 Resp: 18 BP: 101/62 Patient Position (if appropriate): Lying Oxygen Therapy SpO2: 100 % O2 Device: Room Air Pain Pain Assessment Pain Scale: 0-10 Pain Score: 5  Pain Type: Acute pain Pain Location: Hip Pain Orientation: Left Pain Descriptors / Indicators:  Aching Pain Onset: With Activity Pain Intervention(s): Repositioned Pain Interference Pain Interference Pain Effect on Sleep: 2. Occasionally Pain Interference with Therapy Activities: 2. Occasionally Pain Interference with Day-to-Day Activities: 2. Occasionally Home Living/Prior Functioning Home Living Available Help at Discharge: Available 24 hours/day Type of Home: House Home Access: Stairs to enter [employer] of Steps: 1 Entrance Stairs-Rails: None Home Layout: One level Bathroom Shower/Tub: Forensic scientist: Standard  Lives With: Spouse;Family Prior Function Level of Independence: Independent with transfers;Independent with gait  Able to Take Stairs?: Yes Driving: Yes Vocation: Full time employment Vision/Perception  Vision - History Ability to See in Adequate Light: 0 Adequate Vision - Assessment Ocular Range of Motion: Within Functional Limits Alignment/Gaze Preference: Gaze right Saccades: Within functional limits Diplopia Assessment:  (continue to assess - experienced it on acute) Perception Perception: Impaired Preception Impairment Details: Inattention/Neglect;Spatial orientation Praxis Praxis: Impaired Praxis Impairment Details: Motor planning  Cognition Overall Cognitive Status: Impaired/Different from baseline (unsure of language deficits) Arousal/Alertness: Awake/alert Orientation Level: Oriented to person;Oriented to place;Oriented to situation Year: 2025 Month: May Day of Week: Incorrect Attention: Sustained Sustained Attention: Impaired Sustained Attention Impairment: Verbal basic;Functional basic Memory: Impaired Memory Impairment: Decreased short term memory;Decreased recall of new information Decreased Short Term Memory: Verbal basic;Functional basic Awareness: Impaired Awareness Impairment: Intellectual impairment Problem Solving: Impaired Problem Solving Impairment: Functional basic;Verbal basic Executive  Function: Reasoning;Self Monitoring;Decision Making Reasoning: Impaired Reasoning Impairment: Verbal basic;Functional basic Decision Making: Impaired Decision Making Impairment: Verbal basic;Functional basic Self Monitoring: Impaired Self Monitoring Impairment: Functional basic Behaviors: Restless;Impulsive;Perseveration Safety/Judgment: Impaired (impulsive.) Sensation Sensation Light Touch: Impaired Detail Light Touch Impaired Details: Impaired LLE Proprioception: Impaired Detail Proprioception Impaired Details: Impaired LUE;Impaired LLE Coordination Gross Motor Movements are Fluid and Coordinated: No Fine Motor Movements are Fluid and Coordinated: No Finger Nose Finger Test: unable on left Heel Shin Test: unable Motor  Motor Motor: Hemiplegia;Abnormal tone;Abnormal postural alignment and control  Trunk/Postural Assessment  Cervical Assessment Cervical Assessment: Within Functional Limits Thoracic Assessment Thoracic Assessment: Within Functional Limits Lumbar Assessment Lumbar Assessment: Exceptions to [facility] (posterior pelvic tilt.) Postural Control Postural Control: Deficits on evaluation Trunk Control: strong pusher to the left Righting Reactions: absent Protective Responses: absent  Balance Balance Balance Assessed: Yes Static Sitting Balance Static Sitting - Balance Support: Feet supported Static Sitting - Level of Assistance: 4: Min assist Dynamic Sitting Balance Dynamic Sitting - Balance Support: During functional activity Dynamic Sitting - Level of Assistance: 3: Mod assist;2: Max assist Dynamic Sitting Balance - Compensations: min to total in activity Static Standing Balance Static Standing - Balance Support: During functional activity Static Standing - Level of Assistance: 2: Max assist Static Standing - Comment/# of Minutes: +2 for safety with pulling up pants due to pushing Extremity Assessment  RUE Assessment RUE Assessment: Within Functional  Limits LUE Assessment LUE Assessment: Exceptions to [facility] General Strength Comments: pt can elicit some shoulder movement in supine with gravity - distally no movement detected LUE Body System: Neuro Brunstrum levels for arm and hand: Arm;Hand Brunstrum level for arm: Stage I Presynergy Brunstrum level for hand: Stage I Flaccidity LUE Tone LUE Tone: Modified Ashworth Body Part - Modified [place] Scale: Elbow;Wrist;Fingers;Thumb Elbow - Modified [place] Scale for Grading Hypertonia LUE: No increase in muscle tone Wrist - Modified [place] Scale for Grading Hypertonia LUE: No increase in muscle tone Fingers - Modified [place] Scale for Grading Hypertonia LUE: No increase in muscle tone Thumb - Modified [place] Scale for Grading Hypertonia LUE: No increase in muscle tone RLE Assessment RLE Assessment: Within Functional Limits LLE Assessment LLE Assessment: Exceptions to [facility] LLE Strength LLE Overall Strength Comments: 1/5 Left Hip Flexion: 1/5 Left Hip ABduction: 0/5 Left Hip ADduction: 1/5 Left Knee Extension: 1/5 Left Ankle Dorsiflexion: 0/5 Left Ankle Plantar Flexion: 0/5  Care Tool Care Tool Bed Mobility Roll left and right activity   Roll left and right assist level: Maximal Assistance - Patient 25 - 49% (mod A to Left.)    Sit to lying activity   Sit to lying assist level: Maximal Assistance - Patient 25 - 49%    Lying to sitting on side of bed activity   Lying to sitting on side of bed assist level: the ability to move from lying on the back to sitting on the side of the bed with no back support.: Maximal Assistance - Patient 25 - 49%     Care Tool Transfers Sit to stand transfer   Sit to stand assist level: Maximal Assistance - Patient 25 - 49%    Chair/bed transfer   Chair/bed transfer assist level: Maximal Assistance - Patient 25 - 49%    Car transfer Car transfer activity did not occur: Safety/medical concerns        Care Tool Locomotion Ambulation  Ambulation activity did not occur: Safety/medical concerns        Walk 10 feet activity Walk 10 feet activity did not occur: Safety/medical concerns       Walk 50 feet with 2 turns activity Walk 50 feet with 2 turns activity did not occur: Safety/medical concerns      Walk 150 feet activity Walk 150 feet activity did not occur: Safety/medical concerns      Walk 10 feet on uneven surfaces activity Walk 10 feet on uneven surfaces activity did not occur: Safety/medical concerns      Stairs Stair activity did not occur: Safety/medical concerns  Walk up/down 1 step activity Walk up/down 1 step or curb (drop down) activity did not occur: Safety/medical concerns      Walk up/down 4 steps activity Walk up/down 4 steps activity did not occur: Safety/medical concerns      Walk up/down 12 steps activity Walk up/down 12 steps activity did not occur: Safety/medical concerns      Pick up small objects from floor Pick up small object from the floor (from standing position) activity did not occur: Safety/medical concerns      Wheelchair Is the patient using a wheelchair?: Yes Type of Wheelchair: Manual   Wheelchair assist level: Dependent - Patient 0% Max wheelchair distance: 150  Wheel 50 feet with 2 turns activity   Assist Level: Dependent - Patient 0%  Wheel 150 feet activity        Refer to Care Plan for Long Term Goals  SHORT TERM GOAL WEEK 1 PT Short Term Goal 1 (Week 1): Pt will transfer sup to sit w/ mod A consistently. PT Short Term Goal 2 (Week 1): Pt will maintain seated balance x 5' w/ CGA PT Short Term Goal 3 (Week 1): Pt will transfer sit to stand w/ mod A + 1 PT Short Term Goal 4 (Week 1): PT to assess gait.  Recommendations for other services: None   Skilled Therapeutic Intervention Evaluation completed (see details above and below) with education on PT POC and goals and individual treatment initiated with focus on  Balance, transfers, NMR, endurance,  progress to gait.  Pt presents sitting in TIS reclined back.  Pt transferred squat pivot TIS > bed w/ max/total A and cues.  Pt impulsive and will move before PT ready or giving cues.  Pt can be a  strong pusher to L.  Pt requires max A for sit to supine w/ cues for log roll technique.  Pt rolls to L w/ mod A but max/total to R.  Pt requires max A for R sidelying to sit.  Pt transferred sit to stand in [place] w/ mod A and cueing, transferred to front of TIS.  Pt stood in [place] this trial and w/ strong push to L, family asked to move seats from [place] in [place].  Pt wheeled to gyms and into // bars.  Pt stood w/ max/mod A in  bars and blocking of L knee.  PT utilized mirror for visual cueing for upright, midline posture in standing.  Pt returned to room and remained sitting in TIS, reclined back and pillows to support L UE/trunk, chair alarm on and spouse present.     Mobility Bed Mobility Bed Mobility: Supine to Sit;Sit to Supine;Rolling Right;Rolling Left Rolling Right: Maximal Assistance - Patient 25-49% Rolling Left: Moderate Assistance - Patient 50-74% Supine to Sit: Maximal Assistance - Patient - Patient 25-49% Sit to Supine: Maximal Assistance - Patient 25-49% Transfers Transfers: Sit to Stand;Stand to Sit;Stand Pivot Transfers Sit to Stand: Maximal Assistance - Patient 25-49% Stand to Sit: Maximal Assistance - Patient 25-49% Stand Pivot Transfers: Maximal Assistance - Patient 25 - 49% Stand Pivot Transfer Details: Manual facilitation for weight shifting Stand Pivot Transfer Details (indicate cue type and reason): pushing in standing in [place], able to sit EOB w/ CGA, cues for R hand placement and also on lap. Transfer (Assistive device): None (also transferred w/ St[place].) Locomotion  Gait Ambulation: No Gait Gait: No Stairs / Additional Locomotion Stairs: No Wheelchair Mobility Wheelchair Mobility: Yes Wheelchair Assistance: Dependent - Patient 0% Distance: 150  Discharge  Criteria: Patient will be discharged from PT if patient refuses treatment 3 consecutive times without medical reason, if treatment goals not met, if there is a change in medical status, if patient makes no progress towards goals or if patient is discharged from hospital.  The above assessment, treatment plan, treatment alternatives and goals were discussed and mutually agreed upon: by patient and by family  Troy Bishop 12/31/2023, 3:56 PM

## 2023-12-31 NOTE — Evaluation (Signed)
 Occupational Therapy Assessment and Plan  Patient Details  Name: Troy Bishop MRN: [id number] Date of Birth: 06-18-1991  OT Diagnosis: abnormal posture, apraxia, cognitive deficits, and hemiplegia affecting non-dominant side Rehab Potential: Rehab Potential (ACUTE ONLY): Good ELOS: ~ 14-21 days   Today's Date: 12/31/2023 OT Individual Time: 4098-1191 OT Individual Time Calculation (min): 80 min     Hospital Problem: Principal Problem:   ICH (intracerebral hemorrhage) (HCC)   Past Medical History:  Past Medical History:  Diagnosis Date   Liver disease    Painful orthopaedic hardware (HCC) 07/2017   right tibia   Verruca vulgaris 2023   penile mass   Past Surgical History:  Past Surgical History:  Procedure Laterality Date   BREATH TEK H PYLORI N/A 09/08/2016   Procedure: BREATH TEK [name];  Surgeon: [name], MD;  Location: [facility];  Service: Gastroenterology;  Laterality: N/A;   HARDWARE REMOVAL Left 07/27/2017   Procedure: Removal of deep implants right proximal and distal tibia;  Surgeon: Amada Backer, MD;  Location: [facility];  Service: Orthopedics;  Laterality: Left;   TENDON REPAIR Left 08/14/2014   Procedure: LEFT HAND WOUND EXPLORATION AND TENDON REPAIR;  Surgeon: Shellie Dials, MD;  Location: [facility];  Service: Orthopedics;  Laterality: Left;   TIBIA IM NAIL INSERTION  07/31/2012   Procedure: INTRAMEDULLARY (IM) NAIL TIBIAL;  Surgeon: Amada Backer, MD;  Location: [facility];  Service: Orthopedics;  Laterality: Left;   UPPER GI ENDOSCOPY  07/25/2016    Assessment & Plan Clinical Impression: Patient is a 33 y.o. year old male uncontrolled HTN, thrombocytopenia, cirrhosis of liver due to ETOH abuse (Dr. [name]), ETOH withdrawal seizures who was admitted on 12/16/23 with acute onset of left sided weakness with lethargy. UDS showed ETOH level 264He was found to have acute large right cerebral hematoma with active extravasation/spot sign  and no LVO or emergent finding. Repeat CT head with increase in size of hemorrhage and was transfused with 2 units FFP by neurology. Dr. [name] consulted and recommended follow up CT head for monitoring. Tachycardia with agitation due to alcohol withdrawal treated with phenobarb taper.   He was started on hypertonic saline, required Cleviprex  for BP control and precedex  due to ongoing agitation. Serial CT head showed stable bleed. MRI brain showed stable hematoma centered in right basal ganglia extending into anterior right temporal love with extensive edema and 5 mm leftward shift and no abnormal contrast enhancement.  Dr. [name] felt that bleed likely hypertensive in nature.    Medications added for blood pressure control and cleveprex has been weaned off. Thrombocytopenia has been stable.  BLE dopplers  negative for DVT. Abnormal LFTs have been monitored and remain elevated.  He has had issues with left hip pain and X rays done negative for fracture or contusion. LUE ultrasound done due to pain and redness and showed evidence of superficial vein thrombosis left cephalic vein. Low grade fevers have resolved. Mentation and activity tolerance have improved. He requires +2 mod to max assist for standing with max cues for posture and max to total assist with cues for ADLs. He was independent PTA and CIR recommended due to functional decline.  Patient transferred to CIR on 12/30/2023 .    Patient currently requires total with basic self-care skills and functional mobility secondary to muscle weakness, decreased cardiorespiratoy endurance, impaired timing and sequencing, abnormal tone, unbalanced muscle activation, motor apraxia, decreased coordination, and decreased motor planning, decreased visual perceptual skills, decreased  midline orientation, decreased attention to left, and decreased motor planning, decreased attention, decreased awareness, decreased problem solving, decreased safety awareness, decreased memory,  and impulsiveness , and decreased sitting balance, decreased standing balance, decreased postural control, hemiplegia, and decreased balance strategies.  Prior to hospitalization, patient could complete ADL with independent .  Patient will benefit from skilled intervention to decrease level of assist with basic self-care skills and increase independence with basic self-care skills prior to discharge home with care partner.  Anticipate patient will require 24 hour supervision and follow up outpatient.  OT - End of Session Activity Tolerance: Tolerates 30+ min activity with multiple rests Endurance Deficit: Yes OT Assessment Rehab Potential (ACUTE ONLY): Good OT Patient demonstrates impairments in the following area(s): Balance;Cognition;Endurance;Motor;Perception;Safety;Pain;Sensory OT Basic ADL's Functional Problem(s): Eating;Grooming;Bathing;Dressing;Toileting OT Transfers Functional Problem(s): Toilet;Tub/Shower OT Additional Impairment(s): Fuctional Use of Upper Extremity OT Plan OT Intensity: Minimum of 1-2 x/day, 45 to 90 minutes OT Frequency: 5 out of 7 days OT Duration/Estimated Length of Stay: ~ 14-21 days OT Treatment/Interventions: Balance/vestibular training;Disease mangement/prevention;Neuromuscular re-education;Self Care/advanced ADL retraining;Therapeutic Exercise;Wheelchair propulsion/positioning;Cognitive remediation/compensation;DME/adaptive equipment instruction;Pain management;Skin care/wound managment;UE/LE Strength taining/ROM;Community reintegration;Functional electrical stimulation;Patient/family education;Splinting/orthotics;UE/LE Coordination activities;Discharge planning;Functional mobility training;Psychosocial support;Therapeutic Activities;Visual/perceptual remediation/compensation OT Self Feeding Anticipated Outcome(s): supervision OT Basic Self-Care Anticipated Outcome(s): supervision to min A OT Toileting Anticipated Outcome(s): min A OT Bathroom Transfers  Anticipated Outcome(s): min A OT Recommendation Recommendations for Other Services: Neuropsych consult Patient destination: Home Follow Up Recommendations: Outpatient OT Equipment Recommended: To be determined   OT Evaluation Precautions/Restrictions  Precautions Precautions: Fall Precaution/Restrictions Comments: L hemi, neglect Restrictions Weight Bearing Restrictions Per Provider Order: No General Chart Reviewed: Yes Family/Caregiver Present: Yes (father and later wife) Vital Signs   Pain Pain Assessment Pain Scale: 0-10 Pain Score: 0-No pain Pain Location: Hip Pain Intervention(s): Pain med given for lower pain score than stated, per patient request  Home Living/Prior Functioning Home Living Family/patient expects to be discharged to:: Private residence Living Arrangements: Spouse/significant other Available Help at Discharge: Available 24 hours/day Type of Home: House Home Access: Level entry Home Layout: One level Bathroom Shower/Tub: Tub/shower unit, Engineer, building services: Standard  Lives With: Spouse, Family IADL History Education: 8th grade Prior Function Vocation: Full time employment Vision Baseline Vision/History: 0 No visual deficits Ability to See in Adequate Light: 0 Adequate Patient Visual Report: No change from baseline Vision Assessment?: Vision impaired- to be further tested in functional context;Yes Ocular Range of Motion: Within Functional Limits Alignment/Gaze Preference: Gaze right Saccades: Within functional limits Diplopia Assessment:  (continue to assess - experienced it on acute) Perception  Perception: Impaired Praxis Praxis: Impaired Praxis Impairment Details: Motor planning Cognition Cognition Overall Cognitive Status: Impaired/Different from baseline Arousal/Alertness: Awake/alert Orientation Level: Person;Place;Situation Person: Oriented Place: Oriented Situation: Oriented Memory: Impaired Memory Impairment: Decreased  short term memory;Decreased recall of new information Decreased Short Term Memory: Verbal basic;Functional basic Attention: Sustained Sustained Attention: Impaired Sustained Attention Impairment: Verbal basic;Functional basic Awareness: Impaired Awareness Impairment: Intellectual impairment Problem Solving: Impaired Problem Solving Impairment: Functional basic;Verbal basic Executive Function: Reasoning;Self Monitoring;Decision Making Reasoning: Impaired Reasoning Impairment: Verbal basic;Functional basic Decision Making: Impaired Decision Making Impairment: Verbal basic;Functional basic Self Monitoring: Impaired Self Monitoring Impairment: Functional basic Behaviors: Restless;Impulsive;Perseveration Safety/Judgment: Impaired Brief Interview for Mental Status (BIMS) Repetition of Three Words (First Attempt): 3 Temporal Orientation: Year: Correct Temporal Orientation: Month: Missed by more than 1 month Temporal Orientation: Day: Incorrect (said weekend) Recall: "Sock": No, could not recall Recall: "Blue": No, could not recall Recall: "Bed": No, could not recall BIMS  Summary Score: 6 Sensation Sensation Light Touch: Impaired Detail Light Touch Impaired Details: Impaired LUE;Impaired LLE Proprioception: Impaired Detail Proprioception Impaired Details: Impaired LUE;Impaired LLE Coordination Gross Motor Movements are Fluid and Coordinated: No Fine Motor Movements are Fluid and Coordinated: No Finger Nose Finger Test: unable on left Heel Shin Test: unable Motor  Motor Motor: Hemiplegia;Abnormal tone;Abnormal postural alignment and control;Motor apraxia;Clonus;Motor impersistence  Trunk/Postural Assessment  Cervical Assessment Cervical Assessment: Within Functional Limits Thoracic Assessment Thoracic Assessment: Within Functional Limits Lumbar Assessment Lumbar Assessment:  (posterior pelvic tilt) Postural Control Postural Control: Deficits on evaluation Trunk Control:  strong pusher to the left Righting Reactions: absent Protective Responses: absent  Balance Balance Balance Assessed: Yes Static Sitting Balance Static Sitting - Balance Support: Feet supported Static Sitting - Level of Assistance: 3: Mod assist Dynamic Sitting Balance Dynamic Sitting - Balance Support: During functional activity Dynamic Sitting Balance - Compensations: min to total in activity Static Standing Balance Static Standing - Balance Support: During functional activity Static Standing - Level of Assistance: 1: +2 Total assist Static Standing - Comment/# of Minutes: +2 for safety with pulling up pants due to pushing Extremity/Trunk Assessment RUE Assessment RUE Assessment: Within Functional Limits LUE Assessment LUE Assessment: Exceptions to [facility] General Strength Comments: pt can elicit some shoulder movement in supine with gravity - distally no movement detected LUE Body System: Neuro Brunstrum levels for arm and hand: Arm;Hand Brunstrum level for arm: Stage I Presynergy Brunstrum level for hand: Stage I Flaccidity LUE Tone LUE Tone: Modified Ashworth Body Part - Modified Ashworth Scale: Elbow;Wrist;Fingers;Thumb Elbow - Modified Ashworth Scale for Grading Hypertonia LUE: No increase in muscle tone Wrist - Modified Ashworth Scale for Grading Hypertonia LUE: No increase in muscle tone Fingers - Modified Ashworth Scale for Grading Hypertonia LUE: No increase in muscle tone Thumb - Modified Ashworth Scale for Grading Hypertonia LUE: No increase in muscle tone  Care Tool Care Tool Self Care Eating   Eating Assist Level: Moderate Assistance - Patient 50 - 74%    Oral Care    Oral Care Assist Level: Minimal Assistance - Patient > 75%    Bathing   Body parts bathed by patient: Chest;Abdomen;Right upper leg;Left upper leg;Face Body parts bathed by helper: Right arm;Left arm;Front perineal area;Buttocks;Right lower leg;Left lower leg   Assist Level: Maximal Assistance -  Patient 24 - 49%    Upper Body Dressing(including orthotics)   What is the patient wearing?: Pull over shirt   Assist Level: Maximal Assistance - Patient 25 - 49%    Lower Body Dressing (excluding footwear)   What is the patient wearing?: Underwear/pull up;Pants Assist for lower body dressing: 2 Helpers (+2 to pull them up in standing)    Putting on/Taking off footwear   What is the patient wearing?: Non-skid slipper socks Assist for footwear: Dependent - Patient 0%       Care Tool Toileting Toileting activity   Assist for toileting: 2 Helpers     Care Tool Bed Mobility Roll left and right activity   Roll left and right assist level: Maximal Assistance - Patient 25 - 49%    Sit to lying activity        Lying to sitting on side of bed activity   Lying to sitting on side of bed assist level: the ability to move from lying on the back to sitting on the side of the bed with no back support.: Maximal Assistance - Patient 25 - 49%     Care Tool Transfers  Sit to stand transfer   Sit to stand assist level: 2 Helpers    Chair/bed transfer   Chair/bed transfer assist level: Maximal Assistance - Patient 25 - 49%     Toilet transfer   Assist Level: 2 Helpers     Care Tool Cognition  Expression of Ideas and Wants Expression of Ideas and Wants: 3. Some difficulty - exhibits some difficulty with expressing needs and ideas (e.g, some words or finishing thoughts) or speech is not clear  Understanding Verbal and Non-Verbal Content Understanding Verbal and Non-Verbal Content: 3. Usually understands - understands most conversations, but misses some part/intent of message. Requires cues at times to understand   Memory/Recall Ability Memory/Recall Ability : That he or she is in a hospital/hospital unit   Refer to Care Plan for Long Term Goals  SHORT TERM GOAL WEEK 1 OT Short Term Goal 1 (Week 1): Pt will maintain sitting balance on toilet with min A with max cues OT Short Term Goal 2  (Week 1): Pt will don shirt with mod A with cues OT Short Term Goal 3 (Week 1): Pt will attend to left visual field to obtain items for ADL with min questioning cuing OT Short Term Goal 4 (Week 1): Pt will transfer with mod A +1 consistently  Recommendations for other services: Neuropsych   Skilled Therapeutic Intervention 1:1 Ot eval initiated with OT purpose, role and goals discussed with pt and pt's father who was present. Wife did come at end of session and provided an update of session.   Pt received in the bed and trying to eat breakfast- pt demonstrated impulsivity and perseverating on continuing to put more and more food in mouth and it was all over his chest. Pt did c/o hip pain in transitional movement to get to EOB but with extra time and then transitioning to chair pt didn't c/o of any more pain the rest of the session. Pt required max A to transfer with demonstrating pushing syndrome to the left with no awareness of falling or safety. Self care retraining at sink level with education hemi dressing techniques. Max assistance for attention to left body and environment in context of familiar tasks. Pt does have some movement at the shoulder with increased attention to it. Pt required +2 for safety due to pushing and impulsiveness.  Went to the dayroom and performed transfer to the mat with max A. Focus on maintaining sitting balance at midline with use of mirror for visual feedback but required max cues to attend to it. Transitioned to reaching for item in the right upper quadrant to work on active weight shift to the right to decr pushing and then return to midline and progress to slightly to the left without losing balance. Pt able to progress with activity but at minimum required min A to maintain balance (most at times with max - total  due to impulsiveness). Pt left sitting up in TIS w/c with safety belt in place.    ADL ADL Eating: Moderate assistance Grooming: Minimal  assistance Upper Body Bathing: Moderate assistance Where Assessed-Upper Body Bathing: Sitting at sink Lower Body Bathing: Maximal assistance Where Assessed-Lower Body Bathing: Sitting at sink Upper Body Dressing: Maximal assistance Where Assessed-Upper Body Dressing: Sitting at sink Lower Body Dressing: Maximal assistance Where Assessed-Lower Body Dressing: Sitting at sink Mobility  Bed Mobility Bed Mobility: Supine to Sit Supine to Sit: Maximal Assistance - Patient - Patient 25-49% Transfers Sit to Stand: 2 Helpers Stand to Sit: 2 Helpers  Discharge Criteria: Patient will be discharged from OT if patient refuses treatment 3 consecutive times without medical reason, if treatment goals not met, if there is a change in medical status, if patient makes no progress towards goals or if patient is discharged from hospital.  The above assessment, treatment plan, treatment alternatives and goals were discussed and mutually agreed upon: by patient  Oran Bimler 12/31/2023, 12:26 PM

## 2023-12-31 NOTE — Plan of Care (Signed)
  Problem: Consults Goal: RH STROKE PATIENT EDUCATION Description: See Patient Education module for education specifics  Outcome: Progressing   Problem: RH BOWEL ELIMINATION Goal: RH STG MANAGE BOWEL WITH ASSISTANCE Description: STG Manage Bowel with  supervision Assistance. Outcome: Progressing   Problem: RH BLADDER ELIMINATION Goal: RH STG MANAGE BLADDER WITH ASSISTANCE Description: STG Manage Bladder With supervision Assistance Outcome: Progressing   Problem: RH SKIN INTEGRITY Goal: RH STG SKIN FREE OF INFECTION/BREAKDOWN Description: Manage skin free of infection/breakdown with supervision assistance Outcome: Progressing   Problem: RH SAFETY Goal: RH STG ADHERE TO SAFETY PRECAUTIONS W/ASSISTANCE/DEVICE Description: STG Adhere to Safety Precautions With supervision Assistance/Device. Outcome: Progressing

## 2023-12-31 NOTE — Plan of Care (Signed)
  Problem: Consults Goal: RH STROKE PATIENT EDUCATION Description: See Patient Education module for education specifics  Outcome: Progressing   Problem: RH BOWEL ELIMINATION Goal: RH STG MANAGE BOWEL WITH ASSISTANCE Description: STG Manage Bowel with supervision Assistance. Outcome: Progressing   Problem: RH BLADDER ELIMINATION Goal: RH STG MANAGE BLADDER WITH ASSISTANCE Description: STG Manage Bladder With  supervision Assistance Outcome: Progressing   Problem: RH SKIN INTEGRITY Goal: RH STG SKIN FREE OF INFECTION/BREAKDOWN Description: Manage skin free of infection/breakdown with supervision assistance Outcome: Progressing   Problem: RH SAFETY Goal: RH STG ADHERE TO SAFETY PRECAUTIONS W/ASSISTANCE/DEVICE Description: STG Adhere to Safety Precautions With supervision  Assistance/Device. Outcome: Progressing   Problem: RH PAIN MANAGEMENT Goal: RH STG PAIN MANAGED AT OR BELOW PT'S PAIN GOAL Description: < 4 w/prns Outcome: Progressing   Problem: RH KNOWLEDGE DEFICIT Goal: RH STG INCREASE KNOWLEDGE OF HYPERTENSION Description: Manage increase knowledge of hypertension with supervision assistance from wife using educational materials provided Outcome: Progressing Goal: RH STG INCREASE KNOWLEDGE OF DYSPHAGIA/FLUID INTAKE Outcome: Progressing Goal: RH STG INCREASE KNOWLEGDE OF HYPERLIPIDEMIA Description: Manage increase knowledge of hyperlipidemia  with supervision assistance from wife using educational materials provided Outcome: Progressing Goal: RH STG INCREASE KNOWLEDGE OF STROKE PROPHYLAXIS Description: Manage increase knowledge of stroke prophylaxis with supervision assistance from wife using educational materials provided Outcome: Progressing   Problem: RH Vision Goal: RH LTG Vision (Specify) Outcome: Progressing

## 2024-01-01 DIAGNOSIS — R7401 Elevation of levels of liver transaminase levels: Secondary | ICD-10-CM

## 2024-01-01 DIAGNOSIS — I10 Essential (primary) hypertension: Secondary | ICD-10-CM | POA: Diagnosis not present

## 2024-01-01 DIAGNOSIS — G9341 Metabolic encephalopathy: Secondary | ICD-10-CM

## 2024-01-01 DIAGNOSIS — I619 Nontraumatic intracerebral hemorrhage, unspecified: Secondary | ICD-10-CM | POA: Diagnosis not present

## 2024-01-01 DIAGNOSIS — M25552 Pain in left hip: Secondary | ICD-10-CM | POA: Diagnosis not present

## 2024-01-01 LAB — CBC WITH DIFFERENTIAL/PLATELET
Abs Immature Granulocytes: 0.16 10*3/uL — ABNORMAL HIGH (ref 0.00–0.07)
Basophils Absolute: 0.1 10*3/uL (ref 0.0–0.1)
Basophils Relative: 1 %
Eosinophils Absolute: 0.3 10*3/uL (ref 0.0–0.5)
Eosinophils Relative: 5 %
HCT: 37.3 % — ABNORMAL LOW (ref 39.0–52.0)
Hemoglobin: 12.2 g/dL — ABNORMAL LOW (ref 13.0–17.0)
Immature Granulocytes: 2 %
Lymphocytes Relative: 15 %
Lymphs Abs: 1.1 10*3/uL (ref 0.7–4.0)
MCH: 30.6 pg (ref 26.0–34.0)
MCHC: 32.7 g/dL (ref 30.0–36.0)
MCV: 93.5 fL (ref 80.0–100.0)
Monocytes Absolute: 0.7 10*3/uL (ref 0.1–1.0)
Monocytes Relative: 9 %
Neutro Abs: 5 10*3/uL (ref 1.7–7.7)
Neutrophils Relative %: 68 %
Platelets: 225 10*3/uL (ref 150–400)
RBC: 3.99 MIL/uL — ABNORMAL LOW (ref 4.22–5.81)
RDW: 13.4 % (ref 11.5–15.5)
WBC: 7.4 10*3/uL (ref 4.0–10.5)
nRBC: 0 % (ref 0.0–0.2)

## 2024-01-01 LAB — COMPREHENSIVE METABOLIC PANEL WITH GFR
ALT: 259 U/L — ABNORMAL HIGH (ref 0–44)
AST: 117 U/L — ABNORMAL HIGH (ref 15–41)
Albumin: 3 g/dL — ABNORMAL LOW (ref 3.5–5.0)
Alkaline Phosphatase: 85 U/L (ref 38–126)
Anion gap: 7 (ref 5–15)
BUN: 18 mg/dL (ref 6–20)
CO2: 25 mmol/L (ref 22–32)
Calcium: 9.2 mg/dL (ref 8.9–10.3)
Chloride: 106 mmol/L (ref 98–111)
Creatinine, Ser: 0.62 mg/dL (ref 0.61–1.24)
GFR, Estimated: >60 mL/min (ref >60)
Glucose, Bld: 128 mg/dL — ABNORMAL HIGH (ref 70–99)
Potassium: 3.7 mmol/L (ref 3.5–5.1)
Sodium: 138 mmol/L (ref 135–145)
Total Bilirubin: 0.6 mg/dL (ref 0.0–1.2)
Total Protein: 7.4 g/dL (ref 6.5–8.1)

## 2024-01-01 NOTE — Progress Notes (Signed)
 Speech Language Pathology Daily Session Note  Patient Details  Name: Troy Bishop MRN: [id number] Date of Birth: 1990/10/12  Today's Date: 01/01/2024 SLP Individual Time: 1101-1159 SLP Individual Time Calculation (min): 58 min  Short Term Goals: Week 1: SLP Short Term Goal 1 (Week 1): Pt will use safe swallow strategies (small bites/sips, slow rate, lingual sweep) when consuming Dys 3/thin diet to reduce the risk of aspiration/choking. SLP Short Term Goal 2 (Week 1): Pt will use compensatory memory strategies to increase recall of functional information up to 3 minutes when provided verbal cueing and external memory aids. SLP Short Term Goal 3 (Week 1): Pt will name at least 1 cognitive/physical change and how it will functionally impact him to demonstrate increased awareness of deficits and increase reasoning/safety. SLP Short Term Goal 4 (Week 1): Pt will complete verbal safety/reasoning tasks with 60% acc when provided verbal cueing. SLP Short Term Goal 5 (Week 1): Pt will increase speech intelligibility at the sentence level to 80% using compensatory strategies when provided fading verbal cueing.  Skilled Therapeutic Interventions: Skilled therapy session focused on communication and cognitive goals. Upon entrance, patient independently recalled basic biographical information including where he was from, job and family. Patient oriented to month/year, however required external aid to orient to day/date. SLP targeted intellectual awareness through discussion regarding medical situation and subsequent deficits. Patient required modA to demonstrate knowledge of cognitive and physical changes. SLP then targeted communication goals through providing education regarding SLOP (slow, loud, over articulate, pause) strategies. SLP provided mod-maxA for patient to utilize strategies at the sentence level. Patient benefited most from repeating sentences after SLP with verbal reminders to increase vocal intensity  and over articulate. Patient independently verbalized need for bowel movement and SLP aided NT in trasnfer to bedside commode. Patient continent of bowel. Patient left in [facility] with alarm set and call bell in reach. Continue POC  Pain Pain in hip/back, SLP repositioned   Therapy/Group: Individual Therapy  Sandar Krinke M.A., CCC-SLP 01/01/2024, 7:50 AM

## 2024-01-01 NOTE — Progress Notes (Signed)
 Patient ID: Troy Bishop, male   DOB: October 01, 1990, 33 y.o.   MRN: [id number]   1234- SW poke with pt wife [name] to inform on ELOS, and will provide updates after team conference. No questions/concerns reported at this time.   Norval Been, MSW, LCSW Office: [phone] Cell: [phone] Fax: [phone]

## 2024-01-01 NOTE — Care Management (Signed)
 [facility] Individual Statement of Services  Patient Name:  Troy Bishop  Date:  01/01/2024  Welcome to the [facility]patient [facility].  Our goal is to provide you with an individualized program based on your diagnosis and situation, designed to meet your specific needs.  With this comprehensive rehabilitation program, you will be expected to participate in at least 3 hours of rehabilitation therapies Monday-Friday, with modified therapy programming on the weekends.  Your rehabilitation program will include the following services:  Physical Therapy (PT), Occupational Therapy (OT), Speech Therapy (ST), 24 hour per day rehabilitation nursing, Therapeutic Recreaction (TR), Psychology, Neuropsychology, Care Coordinator, Rehabilitation Medicine, Nutrition Services, Pharmacy Services, and Other  Weekly team conferences will be held on Wednesdays to discuss your progress.  Your Inpatient Rehabilitation Care Coordinator will talk with you frequently to get your input and to update you on team discussions.  Team conferences with you and your family in attendance may also be held.  Expected length of stay: 14-21 days    Overall anticipated outcome: Supervision  Depending on your progress and recovery, your program may change. Your Inpatient Rehabilitation Care Coordinator will coordinate services and will keep you informed of any changes. Your Inpatient Rehabilitation Care Coordinator's name and contact numbers are listed  below.  The following services may also be recommended but are not provided by the Inpatient Rehabilitation Center:  Driving Evaluations Home Health Rehabiltiation Services Outpatient Rehabilitation Services Vocational Rehabilitation   Arrangements will be made to provide these services after discharge if needed.  Arrangements include referral to agencies that provide these services.  Your insurance has been verified to be:  [place] [employer]  Your  primary doctor is:  NO PCP listed  Pertinent information will be shared with your doctor and your insurance company.  Inpatient Rehabilitation Care Coordinator:  Kathey Pang [phone] or [phone]  Information discussed with and copy given to patient by: Rennis Case, 01/01/2024, 10:33 AM

## 2024-01-01 NOTE — Plan of Care (Signed)
  Problem: RH Swallowing Goal: LTG Patient will consume least restrictive diet using compensatory strategies with assistance (SLP) Description: LTG:  Patient will consume least restrictive diet using compensatory strategies with assistance (SLP) Flowsheets (Taken 01/01/2024 1440) LTG: Pt Patient will consume least restrictive diet using compensatory strategies with assistance of (SLP): Minimal Assistance - Patient > 75%   Problem: RH Expression Communication Goal: LTG Patient will increase speech intelligibility (SLP) Description: LTG: Patient will increase speech intelligibility at word/phrase/conversation level with cues, % of the time (SLP) Flowsheets (Taken 01/01/2024 1440) LTG: Patient will increase speech intelligibility (SLP): Minimal Assistance - Patient > 75% Level: Phrase Percent of time patient will use intelligible speech: 90   Problem: RH Problem Solving Goal: LTG Patient will demonstrate problem solving for (SLP) Description: LTG:  Patient will demonstrate problem solving for basic/complex daily situations with cues  (SLP) Flowsheets (Taken 01/01/2024 1440) LTG: Patient will demonstrate problem solving for (SLP): Basic daily situations LTG Patient will demonstrate problem solving for: Minimal Assistance - Patient > 75%   Problem: RH Memory Goal: LTG Patient will use memory compensatory aids to (SLP) Description: LTG:  Patient will use memory compensatory aids to recall biographical/new, daily complex information with cues (SLP) Flowsheets (Taken 01/01/2024 1440) LTG: Patient will use memory compensatory aids to (SLP): Minimal Assistance - Patient > 75%   Problem: RH Attention Goal: LTG Patient will demonstrate this level of attention during functional activites (SLP) Description: LTG:  Patient will will demonstrate this level of attention during functional activites (SLP) Flowsheets (Taken 01/01/2024 1440) Patient will demonstrate during cognitive/linguistic activities the  attention type of: Sustained LTG: Patient will demonstrate this level of attention during cognitive/linguistic activities with assistance of (SLP): Minimal Assistance - Patient > 75%   Problem: RH Awareness Goal: LTG: Patient will demonstrate awareness during functional activites type of (SLP) Description: LTG: Patient will demonstrate awareness during functional activites type of (SLP) Flowsheets (Taken 01/01/2024 1440) Patient will demonstrate during cognitive/linguistic activities awareness type of: Intellectual LTG: Patient will demonstrate awareness during cognitive/linguistic activities with assistance of (SLP): Minimal Assistance - Patient > 75%

## 2024-01-01 NOTE — Progress Notes (Signed)
 Occupational Therapy Session Note  Patient Details  Name: Troy Bishop MRN: [id number] Date of Birth: 1991/04/20  Today's Date: 01/01/2024 OT Individual Time: 8413-2440 OT Individual Time Calculation (min): 70 min    Short Term Goals: Week 1:  OT Short Term Goal 1 (Week 1): Pt will maintain sitting balance on toilet with min A with max cues OT Short Term Goal 2 (Week 1): Pt will don shirt with mod A with cues OT Short Term Goal 3 (Week 1): Pt will attend to left visual field to obtain items for ADL with min questioning cuing OT Short Term Goal 4 (Week 1): Pt will transfer with mod A +1 consistently  Skilled Therapeutic Interventions/Progress Updates:     Pt received in bed ready for therapy.  Focus of therapy session on attention to task and slowing down to follow therapists cues.      ADL Retraining: -LB bathing and dressing completed from bed level with pt initiating to wash front perineal area and tops of legs, pt cued to lift hips to doff and don pants, pt able to lift hips partially.  Very minimal activation of L glutes. -from TIS w/c pt engaged in oral care with min A and cues. UB bathing and dressing mod A with pt attending to left arm fairly well  Transfers: -pt is experiencing L hip pain and has increased pain with rolling in bed. Too painful to roll fully onto L hip so had him roll to R side. Pt initially stating he could not do it bc it hurt with L hip rotation.  With encouragement and letting pt know he would only be in this position for a few seconds, pt agreed and came to sit EOB with max A.  -mod A to transfer to L from bed to wc with MAX cues to lean forward   Balance: -CGA to close S sitting EOB, pt able to lean shoulders forward utilizing trunk  Neuromuscular Re-Education:  -from supine tried to elicit some muscle activity in LUE.  Very low tone with L sh subluxation.  No movement. Tested sensation, pt does feel light touch and temp. Will try estim on LUE.  Pt resting in  TIS w/c with all needs met. Alarm set and call light in reach.     Therapy Documentation Precautions:  Precautions Precautions: Fall Precaution/Restrictions Comments: L hemi, neglect Restrictions Weight Bearing Restrictions Per Provider Order: No General:   Vital Signs: Therapy Vitals Pulse Rate: 74 BP: 105/65 Patient Position (if appropriate): Lying Oxygen Therapy SpO2: 98 % O2 Device: Room Air Pain: Pain Assessment Pain Scale: 0-10 Pain Score: 4  Pain Type: Acute pain Pain Location: Hip Pain Descriptors / Indicators: Aching Pain Intervention(s): Medication (See eMAR) ADL: ADL Eating: Moderate assistance Grooming: Minimal assistance Upper Body Bathing: Moderate assistance Where Assessed-Upper Body Bathing: Sitting at sink Lower Body Bathing: Maximal assistance Where Assessed-Lower Body Bathing: Sitting at sink Upper Body Dressing: Moderate assistance Where Assessed-Upper Body Dressing: Sitting at sink Lower Body Dressing: Maximal assistance Where Assessed-Lower Body Dressing: Sitting at sink  Therapy/Group: Individual Therapy  Ksenia Kunz 01/01/2024, 12:35 PM

## 2024-01-01 NOTE — Progress Notes (Signed)
 Troy Redhead, MD Physician Physical Medicine and Rehabilitation   Consult Note    Signed   Date of Service: 12/27/2023 11:58 AM  Related encounter: ED to Hosp-Admission (Discharged) from 12/16/2023 in [facility]   Signed     Expand All Collapse All  Show:Clear all [x] Written[x] Templated[] Copied  Added by: [x] Raulkar, Keven Pel, MD  [] Hover for details          Physical Medicine and Rehabilitation Consult Reason for Consult: ICH Referring Physician: Lesa Rape, MD     HPI: Troy Bishop is a 33 y.o. male with a PMH of HTN, seizure, and daily alcohol use, who was admitted with acute onset left sided weakness. He was found to have a large right subcortical ICH. He was admitted on 5/10. Repeat CT head showed enlargement on the ICH and he received PLT transfusion for PC 54. He has been off Clevipres for multiple days and started on oral medications. He has been started on the CIWA protocol for withdrawal. Core trak tube has been placed with TF infusing. He has elevated livery enzymes and central fevers. Physical Medicine & Rehabilitation was consulted to assess candidacy for CIR.         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 Heavy L Lateral lean and anterior lean when seated EOB. Transfers Overall transfer level: Needs assistance Equipment used: None Transfers: Sit to/from Stand Sit to Stand: Max assist, +2 physical assistance General transfer comment: deferred Ambulation/Gait General Gait Details: unable   ADL: ADL Overall ADL's : Needs assistance/impaired Eating/Feeding: NPO Grooming: Moderate assistance, Maximal assistance, Sitting Grooming Details (indicate cue type and reason): cues for receiving grooming items Upper Body Bathing: Set up Upper Body Bathing Details (indicate cue type and reason): washes L arm with RUE Lower Body Bathing: Total assistance, +2 for physical assistance, +2 for safety/equipment Upper Body Dressing : Maximal assistance, Sitting Lower Body Dressing: Maximal assistance Lower Body Dressing Details (indicate cue type and reason): to don socks using figure 4 from bed level. Cues for problem solving, more reliant on OT to place LLE Toilet Transfer: Maximal assistance, +2 for physical assistance, +2 for safety/equipment Toilet Transfer Details (indicate cue type and reason): stedy Functional mobility during ADLs: +2 for physical assistance, Total assistance   Cognition: Cognition Overall Cognitive Status: Impaired/Different from baseline Orientation Level: Oriented X4 Year: 2025 Month: May Day of Week: Correct Attention: Sustained Sustained Attention: Impaired Sustained Attention Impairment: Verbal basic, Functional basic Awareness: Impaired Awareness Impairment: Intellectual impairment Problem Solving: Impaired Problem Solving Impairment: Functional basic Executive Function: Self Monitoring Self Monitoring: Impaired Self Monitoring Impairment: Functional basic Behaviors: Restless, Impulsive, Perseveration Cognition Arousal: Alert Behavior During Therapy: Flat affect, Restless Overall Cognitive Status:  Impaired/Different from baseline     ROS LUE >LLE weakness     Past Medical  History:  Diagnosis Date   Liver disease     Painful orthopaedic hardware (HCC) 07/2017    right tibia             Past Surgical History:  Procedure Laterality Date   BREATH TEK H PYLORI N/A 09/08/2016    Procedure: BREATH TEK [name];  Surgeon: [name], MD;  Location: [facility];  Service: Gastroenterology;  Laterality: N/A;   HARDWARE REMOVAL Left 07/27/2017    Procedure: Removal of deep implants right proximal and distal tibia;  Surgeon: Amada Backer, MD;  Location: [facility];  Service: Orthopedics;  Laterality: Left;   TENDON REPAIR Left 08/14/2014    Procedure: LEFT HAND WOUND EXPLORATION AND TENDON REPAIR;  Surgeon: Shellie Dials, MD;  Location: [facility];  Service: Orthopedics;  Laterality: Left;   TIBIA IM NAIL INSERTION   07/31/2012    Procedure: INTRAMEDULLARY (IM) NAIL TIBIAL;  Surgeon: Amada Backer, MD;  Location: [facility];  Service: Orthopedics;  Laterality: Left;   UPPER GI ENDOSCOPY   07/25/2016             Family History  Problem Relation Age of Onset   Esophageal cancer Neg Hx     Liver disease Neg Hx     Troy cancer Neg Hx          Social History:  reports that he quit smoking about 9 years ago. His smoking use included cigarettes. He has never used smokeless tobacco. He reports current alcohol use. He reports that he does not use drugs. Allergies:  Allergies  No Known Allergies         Medications Prior to Admission  Medication Sig Dispense Refill   risankizumab-rzaa (SKYRIZI PEN) 150 MG/ML pen Inject 150 mg as directed every 3 (three) months.                Blood pressure (!) 130/91, pulse 82, temperature 97.8 F (36.6 C), temperature source Oral, resp. rate 17, weight 61.5 kg, SpO2 100%. Physical Exam Gen: no distress, normal appearing HEENT: oral mucosa pink and moist, NCAT Cardio: Reg rate Chest: normal effort, normal rate  of breathing Abd: soft, non-distended BJY:NWGN LUE swelling Psych: pleasant, flat affect Skin: intact Neuro: Alert, 5/5 right sided strength, LUE is flaccid, moves LLE minimally, difficulty following commands   Lab Results Last 24 Hours       Results for orders placed or performed during the hospital encounter of 12/16/23 (from the past 24 hours)  Comprehensive metabolic panel with GFR     Status: Abnormal    Collection Time: 12/27/23  5:19 AM  Result Value Ref Range    Sodium 137 135 - 145 mmol/L    Potassium 4.1 3.5 - 5.1 mmol/L    Chloride 108 98 - 111 mmol/L    CO2 19 (L) 22 - 32 mmol/L    Glucose, Bld 107 (H) 70 - 99 mg/dL    BUN 15 6 - 20 mg/dL    Creatinine, Ser 5.62 0.61 - 1.24 mg/dL    Calcium  9.1 8.9 - 10.3 mg/dL    Total Protein 7.5 6.5 - 8.1 g/dL    Albumin 3.1 (L) 3.5 - 5.0 g/dL    AST 130 (H) 15 - 41 U/L    ALT 429 (H) 0 - 44 U/L    Alkaline Phosphatase 100 38 - 126 U/L    Total Bilirubin 0.9 0.0 - 1.2 mg/dL    GFR, Estimated >86 >  60 mL/min    Anion gap 10 5 - 15  CBC     Status: Abnormal    Collection Time: 12/27/23  5:19 AM  Result Value Ref Range    WBC 8.7 4.0 - 10.5 K/uL    RBC 4.16 (L) 4.22 - 5.81 MIL/uL    Hemoglobin 13.1 13.0 - 17.0 g/dL    HCT 16.1 09.6 - 04.5 %    MCV 94.0 80.0 - 100.0 fL    MCH 31.5 26.0 - 34.0 pg    MCHC 33.5 30.0 - 36.0 g/dL    RDW 40.9 81.1 - 91.4 %    Platelets 238 150 - 400 K/uL    nRBC 0.0 0.0 - 0.2 %       Imaging Results (Last 48 hours)  VAS US  UPPER EXTREMITY VENOUS DUPLEX Result Date: 12/26/2023 UPPER VENOUS STUDY  Patient Name:  HARRIET Lasseter  Date of Exam:   12/26/2023 Medical Rec #: [id number]  Accession #:    [id number] Date of Birth: 23-Mar-1991   Patient Gender: M Patient Age:   34 years Exam Location:  [facility] Procedure:      VAS US  UPPER EXTREMITY VENOUS DUPLEX Referring Phys: RAMESH KC --------------------------------------------------------------------------------  Indications: Pain, and redness. Unable to  move left arm. Comparison Study: No priors. Performing Technologist: Franky Ivanoff Sturdivant-Jones RDMS, RVT  Examination Guidelines: A complete evaluation includes B-mode imaging, spectral Doppler, color Doppler, and power Doppler as needed of all accessible portions of each vessel. Bilateral testing is considered an integral part of a complete examination. Limited examinations for reoccurring indications may be performed as noted.  Right Findings: +----------+------------+---------+-----------+----------+-------+ RIGHT     CompressiblePhasicitySpontaneousPropertiesSummary +----------+------------+---------+-----------+----------+-------+ Subclavian               Yes       Yes                      +----------+------------+---------+-----------+----------+-------+  Left Findings: +----------+------------+---------+-----------+----------+-------+ LEFT      CompressiblePhasicitySpontaneousPropertiesSummary +----------+------------+---------+-----------+----------+-------+ IJV           Full       Yes       Yes                      +----------+------------+---------+-----------+----------+-------+ Subclavian    Full       Yes       Yes                      +----------+------------+---------+-----------+----------+-------+ Axillary      Full       Yes       Yes                      +----------+------------+---------+-----------+----------+-------+ Brachial      Full                                          +----------+------------+---------+-----------+----------+-------+ Radial        Full                                          +----------+------------+---------+-----------+----------+-------+ Ulnar         Full                                          +----------+------------+---------+-----------+----------+-------+  Cephalic      None       No        No                Acute  +----------+------------+---------+-----------+----------+-------+ Basilic        Full                                          +----------+------------+---------+-----------+----------+-------+  Summary:  Right: No evidence of thrombosis in the subclavian.  Left: No evidence of deep vein thrombosis in the upper extremity. Findings consistent with acute superficial vein thrombosis involving the left cephalic vein.  *See table(s) above for measurements and observations.  Diagnosing physician: Genny Kid MD Electronically signed by Genny Kid MD on 12/26/2023 at 7:09:07 PM.    Final     DG Chest Port 1 View Result Date: 12/26/2023 CLINICAL DATA:  Fever. EXAM: PORTABLE CHEST 1 VIEW COMPARISON:  Chest radiograph dated 12/17/2023. FINDINGS: No focal consolidation, pleural effusion, or pneumothorax. Stable cardiac silhouette. No acute osseous pathology. IMPRESSION: No active disease. Electronically Signed   By: Angus Bark M.D.   On: 12/26/2023 12:40       Assessment/Plan: Diagnosis: ICH Does the need for close, 24 hr/day medical supervision in concert with the patient's rehab needs make it unreasonable for this patient to be served in a less intensive setting? Yes Co-Morbidities requiring supervision/potential complications:  1) Moderate malnutrition: provide dietary education 2) History of alcohol abuse: consider replacing folic acid  and vitamin B 1 supplements with Metanx and Vitamin B complex 3) HTN: continue Cozaar  and amlodipine  4) Active smoker: continue nicotine  patch 5) Transaminitis, worsening, consider discontinuing tylenol  Due to bladder management, bowel management, safety, skin/wound care, disease management, medication administration, pain management, and patient education, does the patient require 24 hr/day rehab nursing? Yes Does the patient require coordinated care of a physician, rehab nurse, therapy disciplines of PT, OT, SLP to address physical and functional deficits in the context of the above medical diagnosis(es)? Yes Addressing deficits in the  following areas: balance, endurance, locomotion, strength, transferring, bowel/bladder control, bathing, dressing, feeding, grooming, toileting, cognition, and psychosocial support Can the patient actively participate in an intensive therapy program of at least 3 hrs of therapy per day at least 5 days per week? No, we will continue to follow, patient should be at least MaxAx2 in bed mobility in order to tolerate CIR The potential for patient to make measurable gains while on inpatient rehab is excellent Anticipated functional outcomes upon discharge from inpatient rehab are mod assist  with PT, min assist with OT, supervision with SLP. Estimated rehab length of stay to reach the above functional goals is: 2-3 weeks Anticipated discharge destination: Home Overall Rehab/Functional Prognosis: fair   POST ACUTE RECOMMENDATIONS: This patient's condition is appropriate for continued rehabilitative care in the following setting: CIR once able to perform bed mobility MaxAx2 Patient has agreed to participate in recommended program. N/A Note that insurance prior authorization may be required for reimbursement for recommended care.       I have personally performed a face to face diagnostic evaluation of this patient. Additionally, I have examined the patient's medical record including any pertinent labs and radiographic images.     Thanks,   Troy Redhead, MD 12/27/2023
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
## 2024-01-01 NOTE — Progress Notes (Signed)
 Patient ID: Troy Bishop, male   DOB: Jul 12, 1991, 33 y.o.   MRN: [id number] Met with the patient and family members to review current medical situation, rehab process, team conference and plan of care. Review secondary risk management including HTN, HLD (LDL 12/ Trig 224) and ETOH misuse. Patient is anxious to go home; reinforced team conference on Wednesday and ELOS ~14-21 days to work on effects of stroke and weakness. Reports left leg soreness and difficulty using it. Reviewed medications and dietary modification recommendations for heart healthy diet and nutritional supplements and smoking cessation. Continue to follow along to address educational needs to facilitate preparation for discharge. Naoma Bacca

## 2024-01-01 NOTE — Progress Notes (Signed)
 PROGRESS NOTE   Subjective/Complaints:  Pt again reported left hip pain. Irritation of his left eye. No other issues. Family reports he is better than he was a few days ago.  ROS: Patient denies fever, rash, sore throat, blurred vision, dizziness, nausea, vomiting, diarrhea, cough, shortness of breath or chest pain,   headache, or mood change.    Objective:   No results found. Recent Labs    01/01/24 0559  WBC 7.4  HGB 12.2*  HCT 37.3*  PLT 225   Recent Labs    01/01/24 0559  NA 138  K 3.7  CL 106  CO2 25  GLUCOSE 128*  BUN 18  CREATININE 0.62  CALCIUM  9.2        Intake/Output Summary (Last 24 hours) at 01/01/2024 1018 Last data filed at 01/01/2024 0847 Gross per 24 hour  Intake 1547 ml  Output 1000 ml  Net 547 ml        Physical Exam: Vital Signs Blood pressure 105/65, pulse 74, temperature 98.8 F (37.1 C), temperature source Oral, resp. rate 18, height 5\' 7"  (1.702 m), weight 61 kg, SpO2 98%.  Constitutional: No distress . Vital signs reviewed. HEENT: NCAT, EOMI, oral membranes moist. Left eye/sclera is irritated Neck: supple Cardiovascular: RRR without murmur. No JVD    Respiratory/Chest: CTA Bilaterally without wheezes or rales. Normal effort    GI/Abdomen: BS +, non-tender, non-distended Ext: no clubbing, cyanosis, or edema Psych: pleasant and cooperative  Skin: intact over exposed surfaces Neuro: impaired attention, internally distracted, Alert and oriented x3, impaired attention. Left facial droop. Speech dysarthric LLE 1/5 HE, HF and distally is 0/5  , LUE 0/5 except ?fingers, right sided strength is intact, decreased sensation throughout left side although can sense general touch. Musc: left hip tender along great troch and glutes with Passive HF and rotation.   Assessment/Plan: 1. Functional deficits which require 3+ hours per day of interdisciplinary therapy in a comprehensive  inpatient rehab setting. Physiatrist is providing close team supervision and 24 hour management of active medical problems listed below. Physiatrist and rehab team continue to assess barriers to discharge/monitor patient progress toward functional and medical goals  Care Tool:  Bathing    Body parts bathed by patient: Chest, Abdomen, Right upper leg, Left upper leg, Face   Body parts bathed by helper: Right arm, Left arm, Front perineal area, Buttocks, Right lower leg, Left lower leg     Bathing assist Assist Level: Maximal Assistance - Patient 24 - 49%     Upper Body Dressing/Undressing Upper body dressing   What is the patient wearing?: Pull over shirt    Upper body assist Assist Level: Maximal Assistance - Patient 25 - 49%    Lower Body Dressing/Undressing Lower body dressing      What is the patient wearing?: Underwear/pull up, Pants     Lower body assist Assist for lower body dressing: 2 Helpers (+2 to pull them up in standing)     Toileting Toileting    Toileting assist Assist for toileting: 2 Helpers     Transfers Chair/bed transfer  Transfers assist     Chair/bed transfer assist level: Maximal Assistance - Patient 25 -  49%     Locomotion Ambulation   Ambulation assist   Ambulation activity did not occur: Safety/medical concerns          Walk 10 feet activity   Assist  Walk 10 feet activity did not occur: Safety/medical concerns        Walk 50 feet activity   Assist Walk 50 feet with 2 turns activity did not occur: Safety/medical concerns         Walk 150 feet activity   Assist Walk 150 feet activity did not occur: Safety/medical concerns         Walk 10 feet on uneven surface  activity   Assist Walk 10 feet on uneven surfaces activity did not occur: Safety/medical concerns         Wheelchair     Assist Is the patient using a wheelchair?: Yes Type of Wheelchair: Manual    Wheelchair assist level: Dependent  - Patient 0% Max wheelchair distance: 150    Wheelchair 50 feet with 2 turns activity    Assist        Assist Level: Dependent - Patient 0%   Wheelchair 150 feet activity     Assist          Blood pressure 105/65, pulse 74, temperature 98.8 F (37.1 C), temperature source Oral, resp. rate 18, height 5\' 7"  (1.702 m), weight 61 kg, SpO2 98%.  Medical Problem List and Plan: 1. Functional deficits secondary to right basal ganglia large ICH             -patient may shower             -ELOS/Goals: 12-16 days S            -Continue CIR therapies including PT, OT, and SLP  2.  Antithrombotics: -DVT/anticoagulation:  Pharmaceutical: SCDs, heparin  5000U q8h             -antiplatelet therapy: N/A  3. Pain Management: Tylenol  prn. Continue gabapentin 200mg  BID -12/31/23 pt c/o L hip/sacral pain, Xray on 12/28/23 negative, will have nursing look for sacral ulcer, added Oxycodone  2.5mg  q6h PRN but encouraged use of tylenol  as well. Looks like they used tramadol  once before he came here.    5/26- pain is non-specific. Appears to be muscular, perhaps bursitis   -ROM with therapy, OOB   -oxycodone , tylenol  prn   -can also use heat or ice prn 4. Mood/Behavior/Sleep: LCSW to follow for evaluation and support.              -antipsychotic agents: N/A  -Seroquel  50mg  BID, Ativan  0.5mg  q6h PRN, melatonin 5mg  PRN  5. Neuropsych/cognition: This patient is not capable of making decisions on his own behalf.  6. Skin/Wound Care: Routine pressure relief measures.   7. Fluids/Electrolytes/Nutrition: Monitor I/O. Monitor routine labs. Continue vitamins/supplements.  -Dys 3 diet with thins (SLP to eval)   8. Metabolic encephalopathy/ETOH withdrawal: Has resolved, wean seroquel  and ativan  as tolerated. Continue lactulose  30g BID   9. Abnormal LFTs:  AST/ALT trending down. Continue to monitor weekly   10. ABLA?: Hgb 12's, monitor Hgb weekly   11. HTN: no meds PTA, started on amlodipine  10mg   daily, coreg  25mg  BID, losartan  100mg  daily, hydralazine  PRN, labetalol  PRN. SBP goal <160  -01/01/24 BPs generally controlled.  monitor on current regimen with increased mobility  Vitals:   12/30/23 1748 12/30/23 2058 12/30/23 2235 12/31/23 0556  BP: 119/70 123/78 123/78 120/81   12/31/23 1529 12/31/23 1957 01/01/24 0301 01/01/24 3875  BP: 101/62 112/71 125/89 105/65      12. Tobacco abuse: continue nicoderm patches.   13. GI ppx: continue Protonix  40mg  daily  14. Bowel management: continue Miralax  daily  -  LBM 5/24 , incontinent; monitor for now  15. HLD: LDL 124, goal LDL <70, per d/c summary, start atorvastatin  40mg  once LFTs downtrend and repeat in 1 week -12/31/23 ordered CMP for tomorrow morning, if LFTs downtrended, consider starting atorvastatin .   -LFT's elevated but improving--continue to track  -check CK in am given hip pain  LOS: 2 days A FACE TO FACE EVALUATION WAS PERFORMED  Rawland Caddy 01/01/2024, 10:18 AM

## 2024-01-01 NOTE — Progress Notes (Signed)
 Inpatient Rehabilitation Care Coordinator Assessment and Plan Patient Details  Name: Troy Bishop MRN: [id number] Date of Birth: 1991-07-12  Today's Date: 01/01/2024  Hospital Problems: Principal Problem:   ICH (intracerebral hemorrhage) [facility])  Past Medical History:  Past Medical History:  Diagnosis Date   Liver disease    Painful orthopaedic hardware (HCC) 07/2017   right tibia   Verruca vulgaris 2023   penile mass   Past Surgical History:  Past Surgical History:  Procedure Laterality Date   BREATH TEK H PYLORI N/A 09/08/2016   Procedure: BREATH TEK Troy Bishop;  Surgeon: Troy Hugger, MD;  Location: [facility];  Service: Gastroenterology;  Laterality: N/A;   HARDWARE REMOVAL Left 07/27/2017   Procedure: Removal of deep implants right proximal and distal tibia;  Surgeon: Troy Backer, MD;  Location: [facility];  Service: Orthopedics;  Laterality: Left;   TENDON REPAIR Left 08/14/2014   Procedure: LEFT HAND WOUND EXPLORATION AND TENDON REPAIR;  Surgeon: Troy Dials, MD;  Location: [facility];  Service: Orthopedics;  Laterality: Left;   TIBIA IM NAIL INSERTION  07/31/2012   Procedure: INTRAMEDULLARY (IM) NAIL TIBIAL;  Surgeon: Troy Backer, MD;  Location: [facility];  Service: Orthopedics;  Laterality: Left;   UPPER GI ENDOSCOPY  07/25/2016   Social History:  reports that he quit smoking about 9 years ago. His smoking use included cigarettes. He has never used smokeless tobacco. He reports current alcohol use. He reports that he does not use drugs.  Family / Support Systems Marital Status: Married How Long?: 18 years Patient Roles: Spouse, Parent Spouse/Significant Other: Troy (wife) Children: 4 children (15, 35, 9, 1) Other Supports: none Anticipated Caregiver: wife Ability/Limitations of Caregiver: wife will be primary Psychologist, educational Availability: 24/7 Family Dynamics: Pt lives with his wife and children  Social History Preferred  language: English Religion: None Cultural Background: Pt has been working in a sewing factory Education: 8th grade Health Literacy - How often do you need to have someone help you when you read instructions, pamphlets, or other written material from your doctor or pharmacy?: Rarely Writes: Yes Employment Status: Employed Return to Work Plans: TBD. waiting on FMLA/ STD forms Marine scientist Issues: Denies Guardian/Conservator: N/A   Abuse/Neglect Abuse/Neglect Assessment Can Be Completed: Yes Physical Abuse: Denies Verbal Abuse: Denies Sexual Abuse: Denies Exploitation of patient/patient's resources: Denies Self-Neglect: Denies  Patient response to: Social Isolation - How often do you feel lonely or isolated from those around you?: Never  Emotional Status Pt's affect, behavior and adjustment status: Pt is tired during visit, and dozing off/on. Wife answers most of questions. Recent Psychosocial Issues: Denies Psychiatric History: Denies Substance Abuse History: Pt admits to smoking cigarettes 4-5 per day but quit since being admitted. etoh use daily; wife reports 1/2 bottle of liquor and 4-5 heineken beers; denies rec drug use.  Patient / Family Perceptions, Expectations & Goals Pt/Family understanding of illness & functional limitations: Pt and wife have general understanding of care needs Premorbid pt/family roles/activities: Independent Anticipated changes in roles/activities/participation: Assistance with ADLs/IADLs Pt/family expectations/goals: Pt goal is to work on left leg/arm.  Community Resources Troy Bishop: None Premorbid Home Care/DME Agencies: None Transportation available at discharge: wife Is the patient able to respond to transportation needs?: Yes In the past 12 months, has lack of transportation kept you from medical appointments or from getting medications?: No In the past 12 months, has lack of transportation kept you from meetings, work,  or from getting things  needed for daily living?: No Resource referrals recommended: Neuropsychology  Discharge Planning Living Arrangements: Spouse/significant other, Children Support Systems: Spouse/significant other Type of Residence: Private residence Insurance Resources: Media planner (specify) ([place] Medicaid [employer]) Financial Resources: Employment Financial Screen Referred: No Living Expenses: Psychologist, sport and exercise Management: Spouse Does the patient have any problems obtaining your medications?: No Home Management: Wife managed all homecare needs Patient/Family Preliminary Plans: TBD Care Coordinator Barriers to Discharge: Decreased caregiver support, Lack of/limited family support, Insurance for SNF coverage Care Coordinator Anticipated Follow Up Needs: HH/OP Expected length of stay: 14-21 days  Clinical Impression SW met with pt and pt wife in room to introduce self, explain role, discuss discharge process, and inform on ELOS. No HCPOA. No DME  Troy Bishop 01/01/2024, 1:58 PM

## 2024-01-02 ENCOUNTER — Encounter: Payer: Self-pay | Admitting: Physical Medicine & Rehabilitation

## 2024-01-02 DIAGNOSIS — I619 Nontraumatic intracerebral hemorrhage, unspecified: Secondary | ICD-10-CM | POA: Diagnosis not present

## 2024-01-02 DIAGNOSIS — R7401 Elevation of levels of liver transaminase levels: Secondary | ICD-10-CM | POA: Diagnosis not present

## 2024-01-02 DIAGNOSIS — M25552 Pain in left hip: Secondary | ICD-10-CM | POA: Diagnosis not present

## 2024-01-02 DIAGNOSIS — I10 Essential (primary) hypertension: Secondary | ICD-10-CM | POA: Diagnosis not present

## 2024-01-02 LAB — CK: Total CK: 130 U/L (ref 49–397)

## 2024-01-02 LAB — AMMONIA: Ammonia: 23 umol/L (ref 9–35)

## 2024-01-02 MED ORDER — ATORVASTATIN CALCIUM 40 MG PO TABS
40.0000 mg | ORAL_TABLET | Freq: Every day | ORAL | Status: DC
  Administered 2024-01-02 – 2024-01-30 (×27): 40 mg via ORAL
  Filled 2024-01-02 (×30): qty 1

## 2024-01-02 MED ORDER — QUETIAPINE FUMARATE 25 MG PO TABS
25.0000 mg | ORAL_TABLET | Freq: Two times a day (BID) | ORAL | Status: DC
  Administered 2024-01-02 – 2024-01-03 (×3): 25 mg via ORAL
  Filled 2024-01-02 (×3): qty 1

## 2024-01-02 NOTE — Progress Notes (Addendum)
 PROGRESS NOTE   Subjective/Complaints:  Says hip feels a little better today. Received some heat. Likes therapy. Feels "better" when he's up moving around.   ROS: limited due to language/communication     Objective:   No results found. Recent Labs    01/01/24 0559  WBC 7.4  HGB 12.2*  HCT 37.3*  PLT 225   Recent Labs    01/01/24 0559  NA 138  K 3.7  CL 106  CO2 25  GLUCOSE 128*  BUN 18  CREATININE 0.62  CALCIUM  9.2        Intake/Output Summary (Last 24 hours) at 01/02/2024 0924 Last data filed at 01/02/2024 0900 Gross per 24 hour  Intake 1180 ml  Output --  Net 1180 ml        Physical Exam: Vital Signs Blood pressure 112/75, pulse 75, temperature 98.4 F (36.9 C), resp. rate 17, height 5\' 7"  (1.702 m), weight 61 kg, SpO2 100%.  Constitutional: No distress . Vital signs reviewed. HEENT: NCAT, EOMI, oral membranes moist Neck: supple Cardiovascular: RRR without murmur. No JVD    Respiratory/Chest: CTA Bilaterally without wheezes or rales. Normal effort    GI/Abdomen: BS +, non-tender, non-distended Ext: no clubbing, cyanosis, or edema Psych: pleasant and cooperative  Skin: intact over exposed surfaces Neuro: attention a little better, Alert and oriented x3  Left facial droop. Speech remains dysarthric LLE 1/5 HE, HF and distally is 0/5  , LUE 0/5 except ?fingers, right sided strength is intact, decreased sensation throughout left side although can sense general touch. No neuro changes today Musc: left hip sl less tender along great troch and glutes with Passive HF and rotation.   Assessment/Plan: 1. Functional deficits which require 3+ hours per day of interdisciplinary therapy in a comprehensive inpatient rehab setting. Physiatrist is providing close team supervision and 24 hour management of active medical problems listed below. Physiatrist and rehab team continue to assess barriers to  discharge/monitor patient progress toward functional and medical goals  Care Tool:  Bathing    Body parts bathed by patient: Chest, Abdomen, Right upper leg, Left upper leg, Face, Front perineal area, Left arm   Body parts bathed by helper: Right arm, Buttocks, Left lower leg, Right lower leg     Bathing assist Assist Level: Maximal Assistance - Patient 24 - 49%     Upper Body Dressing/Undressing Upper body dressing   What is the patient wearing?: Pull over shirt    Upper body assist Assist Level: Moderate Assistance - Patient 50 - 74%    Lower Body Dressing/Undressing Lower body dressing      What is the patient wearing?: Underwear/pull up, Pants     Lower body assist Assist for lower body dressing: Maximal Assistance - Patient 25 - 49% (bed level)     Toileting Toileting    Toileting assist Assist for toileting: 2 Helpers     Transfers Chair/bed transfer  Transfers assist     Chair/bed transfer assist level: Maximal Assistance - Patient 25 - 49%     Locomotion Ambulation   Ambulation assist   Ambulation activity did not occur: Safety/medical concerns  Walk 10 feet activity   Assist  Walk 10 feet activity did not occur: Safety/medical concerns        Walk 50 feet activity   Assist Walk 50 feet with 2 turns activity did not occur: Safety/medical concerns         Walk 150 feet activity   Assist Walk 150 feet activity did not occur: Safety/medical concerns         Walk 10 feet on uneven surface  activity   Assist Walk 10 feet on uneven surfaces activity did not occur: Safety/medical concerns         Wheelchair     Assist Is the patient using a wheelchair?: Yes Type of Wheelchair: Manual    Wheelchair assist level: Dependent - Patient 0% Max wheelchair distance: 150    Wheelchair 50 feet with 2 turns activity    Assist        Assist Level: Dependent - Patient 0%   Wheelchair 150 feet activity      Assist          Blood pressure 112/75, pulse 75, temperature 98.4 F (36.9 C), resp. rate 17, height 5\' 7"  (1.702 m), weight 61 kg, SpO2 100%.  Medical Problem List and Plan: 1. Functional deficits secondary to right basal ganglia large ICH             -patient may shower             -ELOS/Goals: 12-16 days S           -Continue CIR therapies including PT, OT, and SLP  2.  Antithrombotics: -DVT/anticoagulation:  Pharmaceutical: SCDs, heparin  5000U q8h             -antiplatelet therapy: N/A  3. Pain Management: Tylenol  prn. Continue gabapentin 200mg  BID -12/31/23 pt c/o L hip/sacral pain, Xray on 12/28/23 negative, will have nursing look for sacral ulcer, added Oxycodone  2.5mg  q6h PRN but encouraged use of tylenol  as well. Looks like they used tramadol  once before he came here.    5/26-27- pain is non-specific. Appears to be muscular, perhaps bursitis   -CK wnl   -ROM with therapy, OOB   -oxycodone , tylenol  prn   -continue with heat or ice prn. He finds heat helpful 4. Mood/Behavior/Sleep: LCSW to follow for evaluation and support.              -antipsychotic agents: N/A  -Seroquel  50mg  BID, Ativan  0.5mg  q6h PRN, melatonin 5mg  PRN  -5/27- will reduce seroquel  to 25mg  bid and observe 5. Neuropsych/cognition: This patient is not capable of making decisions on his own behalf.  6. Skin/Wound Care: Routine pressure relief measures.   7. Fluids/Electrolytes/Nutrition: Monitor I/O. Monitor routine labs. Continue vitamins/supplements.  -Dys 3 diet with thins (SLP to eval)   -Intake sporadic. Does better with food from home. 8. Metabolic encephalopathy/ETOH withdrawal: Has resolved, wean seroquel  and ativan  as tolerated. Continue lactulose  30g BID   5/27 ammonia level 23 today 9. Abnormal LFTs:  AST/ALT trending down.    -recheck Monday 6/1   10. ABLA?: Hgb 12's, monitor Hgb weekly   11. HTN: no meds PTA, started on amlodipine  10mg  daily, coreg  25mg  BID, losartan  100mg  daily,  hydralazine  PRN, labetalol  PRN. SBP goal <160  -01/01/24 BPs generally controlled.  monitor on current regimen with increased mobility  Vitals:   12/30/23 1748 12/30/23 2058 12/30/23 2235 12/31/23 0556  BP: 119/70 123/78 123/78 120/81   12/31/23 1529 12/31/23 1957 01/01/24 0301 01/01/24 0841  BP:  101/62 112/71 125/89 105/65   01/01/24 1634 01/01/24 1956 01/02/24 0605  BP: 115/75 131/68 112/75      12. Tobacco abuse: continue nicoderm patches.   13. GI ppx: continue Protonix  40mg  daily  14. Bowel management: continue Miralax  daily  -  LBM 5/26 , was continent yesterday  15. HLD: LDL 124, goal LDL <70, per d/c summary, start atorvastatin  40mg  once LFTs downtrend and repeat in 1 week -5/27 LFT's better yesterday -resume atorvastatin  today      LOS: 3 days A FACE TO FACE EVALUATION WAS PERFORMED  Rawland Caddy 01/02/2024, 9:24 AM

## 2024-01-02 NOTE — Progress Notes (Signed)
 Physical Therapy Session Note  Patient Details  Name: Troy Bishop MRN: [id number] Date of Birth: [birth year]  Today's Date: 01/02/2024 PT Individual Time: 1417-1531 PT Individual Time Calculation (min): 74 min   Short Term Goals: Week 1:  PT Short Term Goal 1 (Week 1): Pt will transfer sup to sit w/ mod A consistently. PT Short Term Goal 2 (Week 1): Pt will maintain seated balance x 5' w/ CGA PT Short Term Goal 3 (Week 1): Pt will transfer sit to stand w/ mod A + 1 PT Short Term Goal 4 (Week 1): PT to assess gait.  Skilled Therapeutic Interventions/Progress Updates:  Patient seated in TIS w/c on entrance to room. Is tilted back and asleep. Wife present and relates that pt is very fatigued. Pt roused by wife. Patient requires brief time then alert and agreeable to PT session.   Patient with no pain complaint at start of session.  Through clarification from wife, he relates that he tends to have pain in L hip and L shoulder. PROM to LLE reveals increase in pain with IR at hip.   Pt is impulsive throughout session.   Wife provides pt with Boost drink prior to session, with swallow, pt loses sip out of L side of mouth. With straw, pt is able to hold drink in mouth with straw to R side of mouth. Despite cues from wife and therapist for small sips, pt drinks entire bottle in one sitting.    Therapeutic Activity: Pt taken to main gym dependently via TIS w/c. Wife present.  Transfers: Pt performed squat pivot to mat table with slight impulsivity to initiate. Requires attn to verbal instructions and visual demonstration prior to performance. Positioning to LLE and pt impulsive to start and is able to clear 50% of distance. Lands with R hip on mat table and L hip unsupported between w/c and mat table. Pt relates significant increase in pain. Pt distracted with vc for need to complete transfer to mat table. Completes with ModA.   Pt asks if he's going to walk. Related to pt need to see sit<>stand first.  Sit<>stand transfers during session with Mod/ Max. Without block to LLE, pt with significant lean to L and inability to self-correct despite vc/ tc. Provided vc/ tc for technique. Blocked practice in NMR.   Neuromuscular Re-ed: NMR facilitated during session with focus on seated balance, standing balance, midline orientation, bodily proprioception. Pt guided in attempt to understand sitting balance with vc to reach cross body with R hand. Pt with LOB with every attempt d/t reduced awareness of deficits. Requires MaxA to maintain balance. Guided in blocked practice of sit<>stands requiring MaxA initially for LLE and trunk, improving to MaxA for LLE and MinA for holding trunk upright. Pt with RUE to arm of w/c for support. Pt does demo pushing with RUE. Relates understanding but continues to intermittently push to L. Wife attempts to relate push vs support or pull but pt unable to maintain.   NMR performed for improvements in motor control and coordination, balance, sequencing, judgement, and self confidence/ efficacy in performing all aspects of mobility at highest level of independence.   E-Stim: Applied 4"x2" sized electrodes to LLE quadriceps muscle group for neuromuscular electrical stimulation (NMES). NMR interventions below using Empi e-stim device set on NMES for large muscle groups. Intensity set to 29mA with visual muscle contraction. Ramp 2sec, on for 15sec and off for 5sec. Pt guided in muscular contraction during activation (on-time) for LAQ movement and noted no AROM  requiring PROM with vc for maintaining attn to task.  After completion of e-stim, pt denies any negative side effects with skin intact and no adverse side effects noted.    Bed Mobility: At end of session, pt performed sit > supine with Min/ modA mainly for BLE to bed surface and neutral positioning. Pt able to follow instructions to use RLE to assist with move toward [facility]. Pt's wife assists with ModA +2. VC/ tc required for  technique.  Patient supine in bed at wife's request at end of session with brakes locked, bed alarm set, and all needs within reach.   Therapy Documentation Precautions:  Precautions Precautions: Fall Precaution/Restrictions Comments: L hemi, neglect Restrictions Weight Bearing Restrictions Per Provider Order: No  Pain:  Unrated pain related by pt in L hip and L shoulder. Pt relates arm is "broken" but attempted to educate to pt that no bones are broken, arm is flaccid d/t stroke. Shoulder pain relieved with sling support into appropriate GH positioning. Pt also demos grimace of 8/10 pain on Wong-Baker pain scale with IR of L hip. Pain reduces with proper support in neutral positioning.   Therapy/Group: Individual Therapy  Donne Gage PT, DPT, CSRS 01/01/2024, 6:28 PM

## 2024-01-02 NOTE — Progress Notes (Signed)
 Physical Therapy Session Note  Patient Details  Name: Troy Bishop MRN: [id number] Date of Birth: [birth year]  Today's Date: 01/02/2024 PT Individual Time: 9562-1308 PT Individual Time Calculation (min): 57 min   Short Term Goals: Week 1:  PT Short Term Goal 1 (Week 1): Pt will transfer sup to sit w/ mod A consistently. PT Short Term Goal 2 (Week 1): Pt will maintain seated balance x 5' w/ CGA PT Short Term Goal 3 (Week 1): Pt will transfer sit to stand w/ mod A + 1 PT Short Term Goal 4 (Week 1): PT to assess gait.  Skilled Therapeutic Interventions/Progress Updates:    Pt presents in bed and agreeable to therapy session. Session focused on NMR to address postural control, balance, reorientation to midline, cognitive remediation in regards to following commands, safety and impulsivity with mobility, and functional strengthening. Education to pt and wife on importance of LUE positioning an attention to placement due to L should subluxation. Stabilized throughout session with total A by PT during transitional movement. Mod to max A to come to EOB from supine with assist for LUE and LLE as well as trunk and cues for slowed movement for safety. Pt already wanting to standing before even sitting appropriately EOB. Donned shoes with total A while tech behind pt for balance as he would lose balance to L and push with RUE. Cues to maintain RUE in lap throughout activity and during scooting EOB. Ulitmately had pt put RUE around therapist during modified stand/squat pivot transfer to decrease pushing tendencies with +2 needed for safety and max A. Utilized standing frame for NMR to address the above deficits with mirror for visual feedback and pt performing functional reaching tasks outside BOS with focus on weightshifting and then returning to midline with mulitmodal cues. Pt requires max cues and facilitation to correct, loosened sling some to be able to increase challenge. Pt then reporting to need to have BM so  returned to room total A and +2 with Stedy to complete toileting tasks (continent BM and urine) with mod to max A for sit > stand with Stedy and assits for LUE management needed. Total for hygiene and clothing management though pt assisting as able though leaning strongly to the L to do this with R hand. Pt requires cues again for safety and awareness of self positioning and body. Set up in TIS w/c at end of session with pillows adjusted for LUE support and positioning as well as safety belt donned. Education with wife on positioning and to notify nursing if she leaves or if he wants to get back into the bed.  Therapy Documentation Precautions:  Precautions Precautions: Fall Precaution/Restrictions Comments: L hemi, neglect Restrictions Weight Bearing Restrictions Per Provider Order: No    Pain: Denies pain currently. Reporting he has been having L hip pain.    Therapy/Group: Individual Therapy  Gita Lamb Amadeo June, PT, DPT, CBIS  01/02/2024, 2:32 PM

## 2024-01-02 NOTE — Progress Notes (Signed)
 Inpatient Rehabilitation  Patient information reviewed and entered into eRehab system by [facility]. Karen Kays., CCC/SLP, PPS Coordinator.  Information including medical coding, functional ability and quality indicators will be reviewed and updated through discharge.

## 2024-01-02 NOTE — IPOC Note (Signed)
 Overall Plan of Care [facility]) Patient Details Name: Troy Bishop MRN: [id number] DOB: 1991-01-07  Admitting Diagnosis: ICH (intracerebral hemorrhage) [facility])  Hospital Problems: Principal Problem:   ICH (intracerebral hemorrhage) (HCC)     Functional Problem List: Nursing Behavior, Bladder, Bowel, Edema, Endurance, Medication Management, Pain, Safety  PT Balance, Safety, Sensory, Endurance, Motor  OT Balance, Cognition, Endurance, Motor, Perception, Safety, Pain, Sensory  SLP Cognition, Motor, Nutrition, Perception  TR         Basic ADL's: OT Eating, Grooming, Bathing, Dressing, Toileting     Advanced  ADL's: OT       Transfers: PT Bed Mobility, Bed to Chair, Car, Occupational psychologist, Research scientist (life sciences): PT Ambulation, Stairs, Psychologist, prison and probation services     Additional Impairments: OT Fuctional Use of Upper Extremity  SLP Swallowing      TR      Anticipated Outcomes Item Anticipated Outcome  Self Feeding supervision  Swallowing  min A   Basic self-care  supervision to min A  Toileting  min A   Bathroom Transfers min A  Bowel/Bladder  manage bowels with medications/ manage bladder with toileting schedules  Transfers  supervision  Locomotion  min A w/ LRAD  Communication  mod A  Cognition  max A  Pain  <4 w/ prns  Safety/Judgment  manage safety with minimal assistance   Therapy Plan: PT Intensity: Minimum of 1-2 x/day ,45 to 90 minutes PT Frequency: 5 out of 7 days PT Duration Estimated Length of Stay: 3 weeks. OT Intensity: Minimum of 1-2 x/day, 45 to 90 minutes OT Frequency: 5 out of 7 days OT Duration/Estimated Length of Stay: ~ 14-21 days SLP Intensity: Minumum of 1-2 x/day, 30 to 90 minutes SLP Frequency: 3 to 5 out of 7 days SLP Duration/Estimated Length of Stay: 2.5-3 weeks   Team Interventions: Nursing Interventions Patient/Family Education, Bladder Management, Bowel Management, Disease Management/Prevention, Pain Management, Medication  Management, Discharge Planning, Dysphagia/Aspiration Precaution Training  PT interventions Ambulation/gait training, Community reintegration, Neuromuscular re-education, UE/LE Strength taining/ROM, Wheelchair propulsion/positioning, UE/LE Coordination activities, Therapeutic Activities, Warden/ranger, Functional electrical stimulation, Functional mobility training, Therapeutic Exercise, Patient/family education  OT Interventions Balance/vestibular training, Disease mangement/prevention, Neuromuscular re-education, Self Care/advanced ADL retraining, Therapeutic Exercise, Wheelchair propulsion/positioning, Cognitive remediation/compensation, DME/adaptive equipment instruction, Pain management, Skin care/wound managment, UE/LE Strength taining/ROM, Community reintegration, Functional electrical stimulation, Patient/family education, Splinting/orthotics, UE/LE Coordination activities, Discharge planning, Functional mobility training, Psychosocial support, Therapeutic Activities, Visual/perceptual remediation/compensation  SLP Interventions Therapeutic Activities, Therapeutic Exercise, Internal/external aids, Dysphagia/aspiration precaution training, Patient/family education, Cognitive remediation/compensation, Environmental controls, Cueing hierarchy, Functional tasks  TR Interventions    SW/CM Interventions Discharge Planning, Psychosocial Support, Patient/Family Education   Barriers to Discharge MD  Medical stability  Nursing Decreased caregiver support, Home environment access/layout Home: House  Home Access: Level entry  Home Layout: One level  PT Other (comments) cognitive/language deficits  OT      SLP      SW Decreased caregiver support, Lack of/limited family support, Community education officer for SNF coverage     Team Discharge Planning: Destination: PT-Home ,OT- Home , SLP-Home Projected Follow-up: PT-Home health PT, OT-  Outpatient OT, SLP-Outpatient SLP, Home Health SLP Projected  Equipment Needs: PT-To be determined, OT- To be determined, SLP- To be determined Equipment Details: PT-Pt has no equipment PTA., OT-  Patient/family involved in discharge planning: PT- Patient, Family member/caregiver,  OT-Patient, Family member/caregiver, SLP-Family member/caregiver  MD ELOS: 18-21 days Medical Rehab Prognosis:  Excellent Assessment: The patient has been admitted for  CIR therapies with the diagnosis of right ICH with left hemipareis. The team will be addressing functional mobility, strength, stamina, balance, safety, adaptive techniques and equipment, self-care, bowel and bladder mgt, patient and caregiver education, NMR, pain mgt, cognition, communication, community reentry. Goals have been set at supervision to min assist with mobility and self-care and min-mod assist with cognition. Anticipated discharge destination is home with family.        See Team Conference Notes for weekly updates to the plan of care

## 2024-01-02 NOTE — Plan of Care (Signed)
  Problem: Consults Goal: RH STROKE PATIENT EDUCATION Description: See Patient Education module for education specifics  Outcome: Progressing   Problem: RH BOWEL ELIMINATION Goal: RH STG MANAGE BOWEL WITH ASSISTANCE Description: STG Manage Bowel with supervision Assistance. Outcome: Progressing   Problem: RH BLADDER ELIMINATION Goal: RH STG MANAGE BLADDER WITH ASSISTANCE Description: STG Manage Bladder With  supervision Assistance Outcome: Progressing   Problem: RH SKIN INTEGRITY Goal: RH STG SKIN FREE OF INFECTION/BREAKDOWN Description: Manage skin free of infection/breakdown with supervision assistance Outcome: Progressing   Problem: RH SAFETY Goal: RH STG ADHERE TO SAFETY PRECAUTIONS W/ASSISTANCE/DEVICE Description: STG Adhere to Safety Precautions With supervision  Assistance/Device. Outcome: Progressing   Problem: RH PAIN MANAGEMENT Goal: RH STG PAIN MANAGED AT OR BELOW PT'S PAIN GOAL Description: < 4 w/prns Outcome: Progressing   Problem: RH KNOWLEDGE DEFICIT Goal: RH STG INCREASE KNOWLEDGE OF HYPERTENSION Description: Manage increase knowledge of hypertension with supervision assistance from wife using educational materials provided Outcome: Progressing Goal: RH STG INCREASE KNOWLEDGE OF DYSPHAGIA/FLUID INTAKE Outcome: Progressing Goal: RH STG INCREASE KNOWLEGDE OF HYPERLIPIDEMIA Description: Manage increase knowledge of hyperlipidemia  with supervision assistance from wife using educational materials provided Outcome: Progressing Goal: RH STG INCREASE KNOWLEDGE OF STROKE PROPHYLAXIS Description: Manage increase knowledge of stroke prophylaxis with supervision assistance from wife using educational materials provided Outcome: Progressing   Problem: RH Vision Goal: RH LTG Vision (Specify) Outcome: Progressing

## 2024-01-03 DIAGNOSIS — I10 Essential (primary) hypertension: Secondary | ICD-10-CM | POA: Diagnosis not present

## 2024-01-03 DIAGNOSIS — R7401 Elevation of levels of liver transaminase levels: Secondary | ICD-10-CM | POA: Diagnosis not present

## 2024-01-03 DIAGNOSIS — M25552 Pain in left hip: Secondary | ICD-10-CM | POA: Diagnosis not present

## 2024-01-03 DIAGNOSIS — I619 Nontraumatic intracerebral hemorrhage, unspecified: Secondary | ICD-10-CM | POA: Diagnosis not present

## 2024-01-03 MED ORDER — NAPHAZOLINE-GLYCERIN 0.012-0.25 % OP SOLN
2.0000 [drp] | Freq: Three times a day (TID) | OPHTHALMIC | Status: DC
  Administered 2024-01-03 – 2024-01-29 (×72): 2 [drp] via OPHTHALMIC
  Filled 2024-01-03 (×3): qty 15

## 2024-01-03 NOTE — Patient Care Conference (Signed)
 Inpatient RehabilitationTeam Conference and Plan of Care Update Date: 01/03/2024   Time: 10:05 AM    Patient Name: Troy Bishop      Medical Record Number: [id number]  Date of Birth: 1990/08/20 Sex: Male         Room/Bed: 4W20C/4W20C-01 Payor Info: Payor: [place] MEDICAID PREPAID HEALTH PLAN / Plan: [place] MEDICAID [employer] [place] / Product Type: *No Product type* /    Admit Date/Time:  12/30/2023  5:40 PM  Primary Diagnosis:  ICH (intracerebral hemorrhage) [facility])  Hospital Problems: Principal Problem:   ICH (intracerebral hemorrhage) [facility])    Expected Discharge Date: Expected Discharge Date: 01/23/24  Team Members Present: Physician leading conference: Dr. Lylia Sand Social Worker Present: Norval Been, LCSW Nurse Present: Forrestine Ike, RN PT Present: Seferino Dade, PTA;Oma Bias, PT OT Present: Julia Saguier, OT SLP Present: Cassidi Sockwell, SLP     Current Status/Progress Goal Weekly Team Focus  Bowel/Bladder   contient for both , use the urinal or bedpan somtirms his commode   healthy regular bowel bladder status   obs any signs for UTI or any compaint and constipation    Swallow/Nutrition/ Hydration   D3/ thin   minA  education and use of swallowing compensatory strategies    ADL's   flaccid LUE/LLE, impulsive, decreased trunk control - max A LB self care and transfers, mod A UB self care   CGA to min A.  If LUE/LLE remain flaccid will need to downgrade goals.   NMR, postural control, ADL training, family education    Mobility   Bed mobility = MaxA up to +2; transfers = MaxA +2 or steadyhand hold to RUE with MaxA; no gait initiated yet, requires TIS for balance in sitting d/t reduced awareness and trunk control   supervision for bed mobility, minA for transfers and ambulation, supervision for sitting and standing balance - may need to be downgraded  Barriers: L hemiplegia, impulsivity, reduced understanding of deficits, decreased attn/  awareness of L hemibody /// Work on: L hemibody NMR, estim to quads, attn to tasks, reducing impulsivity, sitting/ standing balance, quality of transfers, initiating gait training, safety awareness, family education with wife.    Communication   ~70% intelligibility at the phrase/sentence level   minA - 90% at phrase   education and use of SLOP strategies at the phrase level given mod-maxA    Safety/Cognition/ Behavioral Observations  maxA   minA   awareness of situation, STM, basic problem solving, sustained attention to the L    Pain   no pian during the night shft he didnt ask for PRN pain  med s   keep pt off pain or pain score 0/10   pain scale shiftly    Skin   no wound or skin issues   keep skin free wound , issues  skin assessment shiftly      Discharge Planning:  Pt will d/c to home with his wife; wife is primary caregiver of their four children. No other supports. SW will confirm there are no barriers to discharge.   Team Discussion: Patient post large ICH with fair truncal control and increased tone in left LE with pain. Left  UE initially flaccid; now with some movement noted at present. Functional progress limited by tone and dysarthria, poor awareness, poor problem solving and impulsivity.  Patient on target to meet rehab goals: yes, currently needs max assist for transfers to a tilt in space wheelchair.  Goals for discharge set for min assist overall.  *  See Care Plan and progress notes for long and short-term goals.   Revisions to Treatment Plan:  N/a   Teaching Needs: Safety, medication, transfers, toileting, dysphagia, dietary modifications, etc.   Current Barriers to Discharge: Decreased caregiver support and Home enviroment access/layout  Possible Resolutions to Barriers: Family education     Medical Summary Current Status: right bg ICH, dense left hemiparesis. emerging tone LLE, muscular pain, bp controlled, behavior improving. transaminitis   Barriers to Discharge: Medical stability   Possible Resolutions to [employer] Focus: PRAFO to control left heel, weaning seroquel , pain mgt regimen, monitoring bp   Continued Need for Acute Rehabilitation Level of Care: The patient requires daily medical management by a physician with specialized training in physical medicine and rehabilitation for the following reasons: Direction of a multidisciplinary physical rehabilitation program to maximize functional independence : Yes Medical management of patient stability for increased activity during participation in an intensive rehabilitation regime.: Yes Analysis of laboratory values and/or radiology reports with any subsequent need for medication adjustment and/or medical intervention. : Yes   I attest that I was present, lead the team conference, and concur with the assessment and plan of the team.   Forrestine Ike B 01/03/2024, 1:34 PM

## 2024-01-03 NOTE — Progress Notes (Signed)
 Patient ID: Troy Bishop, male   DOB: [birth year], 33 y.o.   MRN: [id number]   SW met with pt, pt wife, and son in room to provide updates from team conference, and d/c date 6/17. Pt wife has FMLA forms. She sent SW email. SW will provide updates after team conference.   SW left FMLA forms for PA.   Norval Been, MSW, LCSW Office: [phone] Cell: [phone] Fax: [phone]

## 2024-01-03 NOTE — Progress Notes (Signed)
 Physical Therapy Session Note  Patient Details  Name: Troy Bishop MRN: [id number] Date of Birth: [birth year]  Today's Date: 01/03/2024 PT Individual Time: 1310-1350 PT Individual Time Calculation (min): 40 min   Short Term Goals: Week 1:  PT Short Term Goal 1 (Week 1): Pt will transfer sup to sit w/ mod A consistently. PT Short Term Goal 2 (Week 1): Pt will maintain seated balance x 5' w/ CGA PT Short Term Goal 3 (Week 1): Pt will transfer sit to stand w/ mod A + 1 PT Short Term Goal 4 (Week 1): PT to assess gait.  Skilled Therapeutic Interventions/Progress Updates: Patient sitting in TIS on entrance to room. Patient alert and agreeable to PT session.   Patient reported unrated pain/discomfort in L UE, R lateral thigh, and R mid back musculature (repositioning, smaller TIS, and manual therapy provided). Session focused on on obtaining smaller TIS as pt's current one was too big, allowing pt to have greater chance at laterally flexing trunk, or having ill positioned hips (one was not available at evaluation). PTA adjusted smaller TIS with appropriate padding donned to increase comfortability on L scapula (will need to fully adjust height of back at future session), and padding on L lateral knee to decrease risk of agitating skin. Pt performed sit<>stands with overall maxA to stabilize (maxA stand pivot with PTA having to manual advance L LE through pivot). Pt slightly impulsive throughout session to stand or transfer, but able to wait after cueing. Pt presented with notable knots on L upper trap musculature with manual therapy to follow (education to pt and pt wife provided). Pt reported slight decrease in pain following, but knot still present and pt willing to continue manual therapy in future sessions.   Patient sitting in TIS with hand off to SLP at end of session.      Therapy Documentation Precautions:  Precautions Precautions: Fall Precaution/Restrictions Comments: L hemi,  neglect Restrictions Weight Bearing Restrictions Per Provider Order: No  Therapy/Group: Individual Therapy  Adonys Wildes PTA 01/03/2024, 3:26 PM

## 2024-01-03 NOTE — Plan of Care (Signed)
  Problem: Consults Goal: RH STROKE PATIENT EDUCATION Description: See Patient Education module for education specifics  01/03/2024 0040 by Ayesha Bold, RN Outcome: Progressing 01/03/2024 0040 by Ayesha Bold, RN Outcome: Progressing   Problem: RH BOWEL ELIMINATION Goal: RH STG MANAGE BOWEL WITH ASSISTANCE Description: STG Manage Bowel with supervision Assistance. 01/03/2024 0040 by Ayesha Bold, RN Outcome: Progressing 01/03/2024 0040 by Ayesha Bold, RN Outcome: Progressing   Problem: RH BLADDER ELIMINATION Goal: RH STG MANAGE BLADDER WITH ASSISTANCE Description: STG Manage Bladder With  supervision Assistance 01/03/2024 0040 by Ayesha Bold, RN Outcome: Progressing 01/03/2024 0040 by Ayesha Bold, RN Outcome: Progressing   Problem: RH SKIN INTEGRITY Goal: RH STG SKIN FREE OF INFECTION/BREAKDOWN Description: Manage skin free of infection/breakdown with supervision assistance 01/03/2024 0040 by Ayesha Bold, RN Outcome: Progressing 01/03/2024 0040 by Ayesha Bold, RN Outcome: Progressing   Problem: RH SAFETY Goal: RH STG ADHERE TO SAFETY PRECAUTIONS W/ASSISTANCE/DEVICE Description: STG Adhere to Safety Precautions With supervision  Assistance/Device. 01/03/2024 0040 by Ayesha Bold, RN Outcome: Progressing 01/03/2024 0040 by Ayesha Bold, RN Outcome: Progressing   Problem: RH PAIN MANAGEMENT Goal: RH STG PAIN MANAGED AT OR BELOW PT'S PAIN GOAL Description: < 4 w/prns 01/03/2024 0040 by Ayesha Bold, RN Outcome: Progressing 01/03/2024 0040 by Ayesha Bold, RN Outcome: Progressing   Problem: RH KNOWLEDGE DEFICIT Goal: RH STG INCREASE KNOWLEDGE OF HYPERTENSION Description: Manage increase knowledge of hypertension with supervision assistance from wife using educational materials provided 01/03/2024 0040 by Ayesha Bold, RN Outcome: Progressing 01/03/2024 0040 by Ayesha Bold, RN Outcome: Progressing Goal: RH STG INCREASE KNOWLEDGE OF DYSPHAGIA/FLUID  INTAKE 01/03/2024 0040 by Ayesha Bold, RN Outcome: Progressing 01/03/2024 0040 by Ayesha Bold, RN Outcome: Progressing Goal: RH STG INCREASE KNOWLEGDE OF HYPERLIPIDEMIA Description: Manage increase knowledge of hyperlipidemia  with supervision assistance from wife using educational materials provided 01/03/2024 0040 by Ayesha Bold, RN Outcome: Progressing 01/03/2024 0040 by Ayesha Bold, RN Outcome: Progressing Goal: RH STG INCREASE KNOWLEDGE OF STROKE PROPHYLAXIS Description: Manage increase knowledge of stroke prophylaxis with supervision assistance from wife using educational materials provided 01/03/2024 0040 by Ayesha Bold, RN Outcome: Progressing 01/03/2024 0040 by Ayesha Bold, RN Outcome: Progressing   Problem: RH Vision Goal: RH LTG Vision (Specify) 01/03/2024 0040 by Ayesha Bold, RN Outcome: Progressing 01/03/2024 0040 by Ayesha Bold, RN Outcome: Progressing

## 2024-01-03 NOTE — Progress Notes (Signed)
 Speech Language Pathology Daily Session Note  Patient Details  Name: Troy Bishop MRN: [id number] Date of Birth: [birth year]  Today's Date: 01/03/2024 SLP Individual Time: 4098-1191 1355--1434 SLP Individual Time Calculation (min): 58 min 39 minutes  Short Term Goals: Week 1: SLP Short Term Goal 1 (Week 1): Pt will use safe swallow strategies (small bites/sips, slow rate, lingual sweep) when consuming Dys 3/thin diet to reduce the risk of aspiration/choking. SLP Short Term Goal 2 (Week 1): Pt will use compensatory memory strategies to increase recall of functional information up to 3 minutes when provided verbal cueing and external memory aids. SLP Short Term Goal 3 (Week 1): Pt will name at least 1 cognitive/physical change and how it will functionally impact him to demonstrate increased awareness of deficits and increase reasoning/safety. SLP Short Term Goal 4 (Week 1): Pt will complete verbal safety/reasoning tasks with 60% acc when provided verbal cueing. SLP Short Term Goal 5 (Week 1): Pt will increase speech intelligibility at the sentence level to 80% using compensatory strategies when provided fading verbal cueing.  Skilled Therapeutic Interventions: Session 1: Skilled therapy session focused on dysarthria and cognitive goals. SLP facilitated session by providing mod verbal and visual A during L attention task. SLP targeted L attention through review of schedule and scanning table span to sequence numbers and identify letters and colors. SLP targeted dysarthria goals through review of SLOP strategies. Patient recalled 2/4 with modA and re-educated on remainder. SLP encouraged use of strategies at the sentence level. Patient repeated sentences verbalized by SLP with modA to reach 70% intelligibility. Patient left in bed with alarm set and call bell in reach. Continue POC.  Session 2: Direct handoff from PT. Skilled therapy session focused on cognitive goals. SLP facilitated session by reviewing  patients current medical situation and subsequent deficits. Patient required maxA to demonstrate awareness of deficits including changes in physical movement, strength, cognition and speech. Patient perseverative on ability to go home and return to work. SLP provided external aid to assist in awareness outside of therapy. SLP targeted L attention and problem solving through simple money management tasks. Patient required max verbal and visual A to count change according to verbalized amount. Patients biggest barriers this activity was differentiating between various coins and attending to L. Patient left in [facility] with alarm set and call bell in reach. Continue POC.  Pain Session 1: Back Pain, LPN aware  Session 2: Back Pain, nursing aware  Therapy/Group: Individual Therapy  Admiral Marcucci M.A., CCC-SLP 01/03/2024, 7:40 AM

## 2024-01-03 NOTE — Plan of Care (Signed)
  Problem: Consults Goal: RH STROKE PATIENT EDUCATION Description: See Patient Education module for education specifics  Outcome: Progressing   Problem: RH BOWEL ELIMINATION Goal: RH STG MANAGE BOWEL WITH ASSISTANCE Description: STG Manage Bowel with supervision Assistance. Outcome: Progressing   Problem: RH BLADDER ELIMINATION Goal: RH STG MANAGE BLADDER WITH ASSISTANCE Description: STG Manage Bladder With  supervision Assistance Outcome: Progressing   Problem: RH SKIN INTEGRITY Goal: RH STG SKIN FREE OF INFECTION/BREAKDOWN Description: Manage skin free of infection/breakdown with supervision assistance Outcome: Progressing   Problem: RH SAFETY Goal: RH STG ADHERE TO SAFETY PRECAUTIONS W/ASSISTANCE/DEVICE Description: STG Adhere to Safety Precautions With supervision  Assistance/Device. Outcome: Progressing   Problem: RH PAIN MANAGEMENT Goal: RH STG PAIN MANAGED AT OR BELOW PT'S PAIN GOAL Description: < 4 w/prns Outcome: Progressing   Problem: RH KNOWLEDGE DEFICIT Goal: RH STG INCREASE KNOWLEDGE OF HYPERTENSION Description: Manage increase knowledge of hypertension with supervision assistance from wife using educational materials provided Outcome: Progressing Goal: RH STG INCREASE KNOWLEDGE OF DYSPHAGIA/FLUID INTAKE Outcome: Progressing Goal: RH STG INCREASE KNOWLEGDE OF HYPERLIPIDEMIA Description: Manage increase knowledge of hyperlipidemia  with supervision assistance from wife using educational materials provided Outcome: Progressing Goal: RH STG INCREASE KNOWLEDGE OF STROKE PROPHYLAXIS Description: Manage increase knowledge of stroke prophylaxis with supervision assistance from wife using educational materials provided Outcome: Progressing   Problem: RH Vision Goal: RH LTG Vision (Specify) Outcome: Progressing

## 2024-01-03 NOTE — Progress Notes (Signed)
 Orthopedic Tech Progress Note Patient Details:  Troy Bishop November 11, 1990 [id number]  Patient has PRAFO BOOT  Patient ID: Troy Bishop, male   DOB: 25-Feb-1991, 33 y.o.   MRN: [id number]  [name] 01/03/2024, 10:03 AM

## 2024-01-03 NOTE — Progress Notes (Signed)
 Occupational Therapy Session Note  Patient Details  Name: Troy Bishop MRN: [id number] Date of Birth: [birth year]  Today's Date: 01/03/2024 OT Individual Time: 1030-1115  (1115 - 1200 40 min of unattended estim) OT Individual Time Calculation (min): 45 min    Short Term Goals: Week 1:  OT Short Term Goal 1 (Week 1): Pt will maintain sitting balance on toilet with min A with max cues OT Short Term Goal 2 (Week 1): Pt will don shirt with mod A with cues OT Short Term Goal 3 (Week 1): Pt will attend to left visual field to obtain items for ADL with min questioning cuing OT Short Term Goal 4 (Week 1): Pt will transfer with mod A +1 consistently  Skilled Therapeutic Interventions/Progress Updates:     Pt received in w/c ready for therapy.       ADL Retraining: -from w/c at sink completed B/D with mod-max A overall. Pt tends to be impulsive so explaining hemidressing strategies can be challenging.  -assisted pt with washing hair at sink -pt completed oral care with set up  Transfers: -mod A supine  to sit EOB with max A initially with balance progressing to min A -mod A squat pivot bed to WC Balance: -mod A to stand at sink with support on L side , pt supported self in stand using R hand  Neuromuscular Re-Education:  -applied attended estim to L forearm for finger extension for 12 min at intensity 28. Pt tolerated well. Integrated a hand over hand task during estim to grasp and release cone with stimulation. -placed estim on L deltoids for unattended cyclic estim for 40 min at intensity 28.  Pt resting in W/c with all needs met. Alarm set and call light in reach.     Returned to room at 1200 to [employer], pt had slid forward in w/c seat so adjusted pt back in seat and tilted his TIS back more and tightened alarm belt to hold hips in place.  Pt tolerated estim well. No adverse skin reactions.   Therapy Documentation Precautions:  Precautions Precautions: Fall Precaution/Restrictions  Comments: L hemi, neglect Restrictions Weight Bearing Restrictions Per Provider Order: No   Pain: Pain Assessment Pain Scale: 0-10 Pain Score: 7  Pain Type: Acute pain Pain Location: Back ADL: ADL Eating: Moderate assistance Grooming: Minimal assistance Upper Body Bathing: Moderate assistance Where Assessed-Upper Body Bathing: Sitting at sink Lower Body Bathing: Maximal assistance Where Assessed-Lower Body Bathing: Sitting at sink Upper Body Dressing: Moderate assistance Where Assessed-Upper Body Dressing: Sitting at sink Lower Body Dressing: Maximal assistance Where Assessed-Lower Body Dressing: Sitting at sink  Therapy/Group: Individual Therapy  Edenilson Austad 01/03/2024, 12:15 PM

## 2024-01-03 NOTE — Progress Notes (Signed)
 PROGRESS NOTE   Subjective/Complaints:  Woke up with back pain today. Not too severe. Left hip feels better this morning.   ROS: Patient denies fever, rash, sore throat, blurred vision, dizziness, nausea, vomiting, diarrhea, cough, shortness of breath or chest pain,   headache, or mood change.   Objective:   No results found. Recent Labs    01/01/24 0559  WBC 7.4  HGB 12.2*  HCT 37.3*  PLT 225   Recent Labs    01/01/24 0559  NA 138  K 3.7  CL 106  CO2 25  GLUCOSE 128*  BUN 18  CREATININE 0.62  CALCIUM  9.2        Intake/Output Summary (Last 24 hours) at 01/03/2024 0829 Last data filed at 01/02/2024 1805 Gross per 24 hour  Intake 720 ml  Output 100 ml  Net 620 ml        Physical Exam: Vital Signs Blood pressure 115/75, pulse 88, temperature 98.3 F (36.8 C), resp. rate 18, height 5\' 7"  (1.702 m), weight 61 kg, SpO2 100%.  Constitutional: No distress . Vital signs reviewed. HEENT: NCAT, EOMI, oral membranes moist, left eye still a little irritated Neck: supple Cardiovascular: RRR without murmur. No JVD    Respiratory/Chest: CTA Bilaterally without wheezes or rales. Normal effort    GI/Abdomen: BS +, non-tender, non-distended Ext: no clubbing, cyanosis, or edema Psych: pleasant and cooperative  Skin: intact over exposed surfaces Neuro: attention a little better, Alert and oriented x3  Left facial droop. Speech remains dysarthric LLE 1/5 HE, HF and distally is 0/5, LUE 0/5 --don't see hand or fingers, right sided strength is intact, decreased sensation throughout left side although can sense general touch. Increased DTR's LLE with sustained clonus at ankle. Heel cord tight Musc: left hip sl less tender along great troch and glutes with Passive HF and rotation.   Assessment/Plan: 1. Functional deficits which require 3+ hours per day of interdisciplinary therapy in a comprehensive inpatient rehab  setting. Physiatrist is providing close team supervision and 24 hour management of active medical problems listed below. Physiatrist and rehab team continue to assess barriers to discharge/monitor patient progress toward functional and medical goals  Care Tool:  Bathing    Body parts bathed by patient: Chest, Abdomen, Right upper leg, Left upper leg, Face, Front perineal area, Left arm   Body parts bathed by helper: Right arm, Buttocks, Left lower leg, Right lower leg     Bathing assist Assist Level: Maximal Assistance - Patient 24 - 49%     Upper Body Dressing/Undressing Upper body dressing   What is the patient wearing?: Pull over shirt    Upper body assist Assist Level: Moderate Assistance - Patient 50 - 74%    Lower Body Dressing/Undressing Lower body dressing      What is the patient wearing?: Underwear/pull up, Pants     Lower body assist Assist for lower body dressing: Maximal Assistance - Patient 25 - 49% (bed level)     Toileting Toileting    Toileting assist Assist for toileting: 2 Helpers     Transfers Chair/bed transfer  Transfers assist  Chair/bed transfer activity did not occur: Safety/medical concerns  Chair/bed  transfer assist level: 2 Helpers     Locomotion Ambulation   Ambulation assist   Ambulation activity did not occur: Safety/medical concerns          Walk 10 feet activity   Assist  Walk 10 feet activity did not occur: Safety/medical concerns        Walk 50 feet activity   Assist Walk 50 feet with 2 turns activity did not occur: Safety/medical concerns         Walk 150 feet activity   Assist Walk 150 feet activity did not occur: Safety/medical concerns         Walk 10 feet on uneven surface  activity   Assist Walk 10 feet on uneven surfaces activity did not occur: Safety/medical concerns         Wheelchair     Assist Is the patient using a wheelchair?: Yes Type of Wheelchair: Manual     Wheelchair assist level: Dependent - Patient 0% Max wheelchair distance: 150    Wheelchair 50 feet with 2 turns activity    Assist        Assist Level: Dependent - Patient 0%   Wheelchair 150 feet activity     Assist      Assist Level: Dependent - Patient 0%   Blood pressure 115/75, pulse 88, temperature 98.3 F (36.8 C), resp. rate 18, height 5\' 7"  (1.702 m), weight 61 kg, SpO2 100%.  Medical Problem List and Plan: 1. Functional deficits secondary to right basal ganglia large ICH             -patient may shower             -ELOS/Goals: 12-16 days S          -Continue CIR therapies including PT, OT, and SLP. Interdisciplinary team conference today to discuss goals, barriers to discharge, and dc planning.   -pt with emerging tone in LLE-- ordered PRAFO to help control foot/ankle.   2.  Antithrombotics: -DVT/anticoagulation:  Pharmaceutical: SCDs, heparin  5000U q8h             -antiplatelet therapy: N/A  3. Pain Management: Tylenol  prn. Continue gabapentin 200mg  BID -  Oxycodone  2.5mg  q6h PRN     5/28 having intermittent muscular pain it appears, left hip pain better   -continue above as well as heat/ice/rom/oob 4. Mood/Behavior/Sleep: LCSW to follow for evaluation and support.              -antipsychotic agents: N/A  -Seroquel  50mg  BID, Ativan  0.5mg  q6h PRN, melatonin 5mg  PRN  -5/28- reduced seroquel  to 25mg  bid without problems. Dc soon 5. Neuropsych/cognition: This patient is not capable of making decisions on his own behalf.  6. Skin/Wound Care: Routine pressure relief measures.   7. Fluids/Electrolytes/Nutrition: Monitor I/O. Monitor routine labs. Continue vitamins/supplements.  -Dys 3 diet with thins (SLP to eval)   -Intake sporadic. Does better with food from home. 8. Metabolic encephalopathy/ETOH withdrawal: Has resolved, wean seroquel  and ativan  as tolerated. Continue lactulose  30g BID   5/27 ammonia level 23 today 9. Abnormal LFTs:  AST/ALT trending  down.    -recheck Monday 6/1   10. ABLA?: Hgb 12's, monitor Hgb weekly   11. HTN: no meds PTA, started on amlodipine  10mg  daily, coreg  25mg  BID, losartan  100mg  daily, hydralazine  PRN, labetalol  PRN. SBP goal <160  -01/03/24 BPs generally controlled.  monitor on current regimen with increased mobility  Vitals:   12/30/23 1748 12/30/23 2058 12/30/23 2235 12/31/23 0556  BP: 119/70  123/78 123/78 120/81   12/31/23 1529 12/31/23 1957 01/01/24 0301 01/01/24 0841  BP: 101/62 112/71 125/89 105/65   01/01/24 1634 01/01/24 1956 01/02/24 0605 01/02/24 2011  BP: 115/75 131/68 112/75 115/75      12. Tobacco abuse: continue nicoderm patches.   13. GI ppx: continue Protonix  40mg  daily  14. Bowel management: continue Miralax  daily  -  LBM 5/26 , was continent yesterday  15. HLD: LDL 124, goal LDL <70, per d/c summary, start atorvastatin  40mg  once LFTs downtrend and repeat in 1 week -5/27 LFT's better yesterday -resumec atorvastatin      -5/28 recheck cmet Monday   LOS: 4 days A FACE TO FACE EVALUATION WAS PERFORMED  Rawland Caddy 01/03/2024, 8:29 AM

## 2024-01-04 DIAGNOSIS — I10 Essential (primary) hypertension: Secondary | ICD-10-CM | POA: Diagnosis not present

## 2024-01-04 DIAGNOSIS — I619 Nontraumatic intracerebral hemorrhage, unspecified: Secondary | ICD-10-CM | POA: Diagnosis not present

## 2024-01-04 DIAGNOSIS — M25552 Pain in left hip: Secondary | ICD-10-CM | POA: Diagnosis not present

## 2024-01-04 DIAGNOSIS — R7401 Elevation of levels of liver transaminase levels: Secondary | ICD-10-CM | POA: Diagnosis not present

## 2024-01-04 MED ORDER — QUETIAPINE FUMARATE 25 MG PO TABS
25.0000 mg | ORAL_TABLET | Freq: Every day | ORAL | Status: DC
  Administered 2024-01-04 – 2024-01-05 (×2): 25 mg via ORAL
  Filled 2024-01-04 (×2): qty 1

## 2024-01-04 NOTE — Progress Notes (Signed)
 PROGRESS NOTE   Subjective/Complaints:  Slept well. Denies significant pain today. Doesn't look as if he received PRAFO. Eyes still a bit irritated  ROS: Patient denies fever, rash, sore throat, blurred vision, dizziness, nausea, vomiting, diarrhea, cough, shortness of breath or chest pain,  headache, or mood change.   Objective:   No results found. No results for input(s): "WBC", "HGB", "HCT", "PLT" in the last 72 hours.  No results for input(s): "NA", "K", "CL", "CO2", "GLUCOSE", "BUN", "CREATININE", "CALCIUM " in the last 72 hours.       Intake/Output Summary (Last 24 hours) at 01/04/2024 0833 Last data filed at 01/03/2024 1215 Gross per 24 hour  Intake 476 ml  Output --  Net 476 ml        Physical Exam: Vital Signs Blood pressure 117/80, pulse 82, temperature 99.2 F (37.3 C), temperature source Oral, resp. rate 18, height 5\' 7"  (1.702 m), weight 62.2 kg, SpO2 100%.  Constitutional: No distress . Vital signs reviewed. HEENT: NCAT, EOMI, oral membranes moist, both sclera a bit irritated Neck: supple Cardiovascular: RRR without murmur. No JVD    Respiratory/Chest: CTA Bilaterally without wheezes or rales. Normal effort    GI/Abdomen: BS +, non-tender, non-distended Ext: no clubbing, cyanosis, or edema Psych: pleasant and cooperative  Skin: intact over exposed surfaces Neuro: attention a little better, Alert and oriented x3  Left facial droop. Speech remains dysarthric LLE 1/5 HE, HF and distally is 0/5, LUE 0/5 --don't see hand or fingers, right sided strength is intact, decreased sensation throughout left side although can sense general touch. Increased DTR's LLE with ongoing sustained clonus at ankle. Heel cord tight but can be ranged to neutral Musc: no back pain or hip pain today   Assessment/Plan: 1. Functional deficits which require 3+ hours per day of interdisciplinary therapy in a comprehensive inpatient  rehab setting. Physiatrist is providing close team supervision and 24 hour management of active medical problems listed below. Physiatrist and rehab team continue to assess barriers to discharge/monitor patient progress toward functional and medical goals  Care Tool:  Bathing    Body parts bathed by patient: Chest, Abdomen, Right upper leg, Left upper leg, Face, Front perineal area, Left arm   Body parts bathed by helper: Right arm, Buttocks, Left lower leg, Right lower leg     Bathing assist Assist Level: Maximal Assistance - Patient 24 - 49%     Upper Body Dressing/Undressing Upper body dressing   What is the patient wearing?: Pull over shirt    Upper body assist Assist Level: Moderate Assistance - Patient 50 - 74%    Lower Body Dressing/Undressing Lower body dressing      What is the patient wearing?: Underwear/pull up, Pants     Lower body assist Assist for lower body dressing: Maximal Assistance - Patient 25 - 49% (bed level)     Toileting Toileting    Toileting assist Assist for toileting: 2 Helpers     Transfers Chair/bed transfer  Transfers assist  Chair/bed transfer activity did not occur: Safety/medical concerns  Chair/bed transfer assist level: 2 Helpers     Locomotion Ambulation   Ambulation assist   Ambulation activity did not  occur: Safety/medical concerns          Walk 10 feet activity   Assist  Walk 10 feet activity did not occur: Safety/medical concerns        Walk 50 feet activity   Assist Walk 50 feet with 2 turns activity did not occur: Safety/medical concerns         Walk 150 feet activity   Assist Walk 150 feet activity did not occur: Safety/medical concerns         Walk 10 feet on uneven surface  activity   Assist Walk 10 feet on uneven surfaces activity did not occur: Safety/medical concerns         Wheelchair     Assist Is the patient using a wheelchair?: Yes Type of Wheelchair: Manual     Wheelchair assist level: Dependent - Patient 0% Max wheelchair distance: 150    Wheelchair 50 feet with 2 turns activity    Assist        Assist Level: Dependent - Patient 0%   Wheelchair 150 feet activity     Assist      Assist Level: Dependent - Patient 0%   Blood pressure 117/80, pulse 82, temperature 99.2 F (37.3 C), temperature source Oral, resp. rate 18, height 5\' 7"  (1.702 m), weight 62.2 kg, SpO2 100%.  Medical Problem List and Plan: 1. Functional deficits secondary to right basal ganglia large ICH             -patient may shower             -ELOS/Goals: 12-16 days S         -pt with emerging tone in LLE-- awaiting PRAFO to help control foot/ankle.  Don't think we need antispasmodics yet 2.  Antithrombotics: -DVT/anticoagulation:  Pharmaceutical: SCDs, heparin  5000U q8h             -antiplatelet therapy: N/A  3. Pain Management: Tylenol  prn. Continue gabapentin 200mg  BID -  Oxycodone  2.5mg  q6h PRN     5/28 having intermittent muscular pain it appears, left hip pain better   -continue above as well as heat/ice/rom/oob 4. Mood/Behavior/Sleep: LCSW to follow for evaluation and support.              -antipsychotic agents: N/A  -Seroquel    Ativan  0.5mg  q6h PRN, melatonin 5mg  PRN  -5/29-   seroquel  25mg  bid--> reduce to at bedtime only 5. Neuropsych/cognition: This patient is not capable of making decisions on his own behalf.  6. Skin/Wound Care: Routine pressure relief measures.   7. Fluids/Electrolytes/Nutrition: Monitor I/O. Monitor routine labs. Continue vitamins/supplements.  -Dys 3 diet with thins (SLP to eval)   -Intake improved.  Does better with food from home. 8. Metabolic encephalopathy/ETOH withdrawal: Has resolved, wean seroquel  and ativan  as tolerated. Continue lactulose  30g BID   5/27 ammonia level 23 today 9. Abnormal LFTs:  AST/ALT trending down.    -recheck Monday 6/1   10. ABLA?: Hgb 12's, monitor Hgb weekly   11. HTN: no meds PTA,  started on amlodipine  10mg  daily, coreg  25mg  BID, losartan  100mg  daily, hydralazine  PRN, labetalol  PRN. SBP goal <160  -01/04/24 BPs generally controlled.  monitor on current regimen with increased mobility  Vitals:   12/31/23 0556 12/31/23 1529 12/31/23 1957 01/01/24 0301  BP: 120/81 101/62 112/71 125/89   01/01/24 0841 01/01/24 1634 01/01/24 1956 01/02/24 0605  BP: 105/65 115/75 131/68 112/75   01/02/24 2011 01/03/24 1509 01/03/24 1953 01/04/24 0600  BP: 115/75 106/64 113/66 117/80  12. Tobacco abuse: continue nicoderm patches.   13. GI ppx: continue Protonix  40mg  daily  14. Bowel management: continue Miralax  daily  -  LBM 5/26 , was continent yesterday  15. HLD: LDL 124, goal LDL <70, per d/c summary, start atorvastatin  40mg  once LFTs downtrend and repeat in 1 week -5/27 LFT's better yesterday -resumed atorvastatin    -5/29 recheck cmet Monday   LOS: 5 days A FACE TO FACE EVALUATION WAS PERFORMED  Rawland Caddy 01/04/2024, 8:33 AM

## 2024-01-04 NOTE — Progress Notes (Signed)
 Speech Language Pathology Daily Session Note  Patient Details  Name: Troy Bishop MRN: [id number] Date of Birth: 12/26/1990  Today's Date: 01/04/2024 SLP Individual Time: 0843-0900 SLP Individual Time Calculation (min): 17 min  Short Term Goals: Week 1: SLP Short Term Goal 1 (Week 1): Pt will use safe swallow strategies (small bites/sips, slow rate, lingual sweep) when consuming Dys 3/thin diet to reduce the risk of aspiration/choking. SLP Short Term Goal 2 (Week 1): Pt will use compensatory memory strategies to increase recall of functional information up to 3 minutes when provided verbal cueing and external memory aids. SLP Short Term Goal 3 (Week 1): Pt will name at least 1 cognitive/physical change and how it will functionally impact him to demonstrate increased awareness of deficits and increase reasoning/safety. SLP Short Term Goal 4 (Week 1): Pt will complete verbal safety/reasoning tasks with 60% acc when provided verbal cueing. SLP Short Term Goal 5 (Week 1): Pt will increase speech intelligibility at the sentence level to 80% using compensatory strategies when provided fading verbal cueing.  Skilled Therapeutic Interventions:  Pt was greeted at bedside. He was awake/alert and agreeable to tx tasks targeting cognition and speech production. He required s cues to recall the current date and maxA to recall dates of hospitalization. He also benefited from maxA to ID one cog-linguistic change s/p CVA. He was able to recall 2/4 compensatory speech strategies independently. Initially, he was ~40% intelligible at sentence level, which increased to ~70% intelligibility with modA. At the end of tx tasks, he was left in bed with the alarm set. Recommend cont ST.   Pain  No pain reported  Therapy/Group: Individual Therapy  Rozell Cornet 01/04/2024, 8:52 AM

## 2024-01-04 NOTE — Progress Notes (Signed)
 Speech Language Pathology Daily Session Note  Patient Details  Name: Troy Bishop MRN: [id number] Date of Birth: 1991-07-02  Today's Date: 01/04/2024 SLP Individual Time: 1101-1158 SLP Individual Time Calculation (min): 57 min  Short Term Goals: Week 1: SLP Short Term Goal 1 (Week 1): Pt will use safe swallow strategies (small bites/sips, slow rate, lingual sweep) when consuming Dys 3/thin diet to reduce the risk of aspiration/choking. SLP Short Term Goal 2 (Week 1): Pt will use compensatory memory strategies to increase recall of functional information up to 3 minutes when provided verbal cueing and external memory aids. SLP Short Term Goal 3 (Week 1): Pt will name at least 1 cognitive/physical change and how it will functionally impact him to demonstrate increased awareness of deficits and increase reasoning/safety. SLP Short Term Goal 4 (Week 1): Pt will complete verbal safety/reasoning tasks with 60% acc when provided verbal cueing. SLP Short Term Goal 5 (Week 1): Pt will increase speech intelligibility at the sentence level to 80% using compensatory strategies when provided fading verbal cueing.  Skilled Therapeutic Interventions: Skilled therapy session focused on dysphagia, dysarthria and cognitive goals. SLP facilitated session by observing patient with D3 solids and thin liquids via straw. Patient with adequate mastication times and full oral clearance with use of liquid wash and lingual sweep. Patient required mod verbal and visual reminders to place straw on the R to reduce anterior spillage of thin liquids. No s/sx of aspiration throughout. SLP targeted dysarthria through review of SLOP (slow, loud, over articulate, pause) speech strategies. SLP encouraged use of strategies during trisyllabic words and short sentences. Patient required modA to reach 80% intelligibility at the word level and 70% intelligibility at the short sentence level. SLP targeted cognitive goals through providing  mod-maxA for patient to attend to task and leave phone on the table during therapy session. Patient continually attempted to call various family members during session. Patient left in chair with alarm set and call bell in reach. Continue POC.    Pain None reported   Therapy/Group: Individual Therapy  [place] Jr M.A., CCC-SLP 01/04/2024, 7:46 AM

## 2024-01-04 NOTE — Progress Notes (Signed)
 Occupational Therapy Session Note  Patient Details  Name: Troy Bishop MRN: [id number] Date of Birth: [birth year]  Today's Date: 01/04/2024 OT Individual Time: 0950-1050 OT Individual Time Calculation (min): 60 min (unattended estim for 40 min 1050- 1130)   Short Term Goals: Week 1:  OT Short Term Goal 1 (Week 1): Pt will maintain sitting balance on toilet with min A with max cues OT Short Term Goal 2 (Week 1): Pt will don shirt with mod A with cues OT Short Term Goal 3 (Week 1): Pt will attend to left visual field to obtain items for ADL with min questioning cuing OT Short Term Goal 4 (Week 1): Pt will transfer with mod A +1 consistently  Skilled Therapeutic Interventions/Progress Updates:     Pt received in w/c ready for therapy.    ADL Retraining: - pt needed to urinate, assisted pt with adjusting his pants and using urinal -assist to wash R hand, set up for pt to wash face. -declined need for bathing and dressing as he was already dressed for the day  Therapeutic Activity/ Exercise:  Transfers: -mod A to sit to EOB with c/o L hip pain,  min A to squat pivot to L in 2 steps with improved trunk control and ability to follow 1 step directions -continues to be impulsive and needs FIRM direct cues to not rush steps of transfer -sit to stand with RUE support min A with mod A to support in stand for L knee extension    Neuromuscular Re-Education:  -at start of session, placed estim on L deltoids at intensity 24 with cues to pt to try to "squeeze" his shoulder into his body.  During this time of estim, obtained a new w/c cushion as pt has been sliding forward in his seat.  -tapping over L quad for knee to engage muscle during standing -standing with use of long mirror for feedback, without mirror pt pushing and leaning and with mirror alignment significantly improved.   -at end of session placed estim on forearm for unattended estim with cues to have pt focus on his hand,  intensity 24.  No  adverse skin reactions post removal.   Pt resting in w/c with TIS w/c tilted back to avoid forward hip side. All needs met. Alarm set - belt adjusted to be tighter around hips. call light and phone in reach.     Therapy Documentation Precautions:  Precautions Precautions: Fall Precaution/Restrictions Comments: L hemi, neglect Restrictions Weight Bearing Restrictions Per Provider Order: No    Vital Signs: Therapy Vitals Temp: 99.2 F (37.3 C) Temp Source: Oral Pulse Rate: 82 Resp: 18 BP: 117/80 Patient Position (if appropriate): Lying Pain:   ADL: ADL Eating: Moderate assistance Grooming: Minimal assistance Upper Body Bathing: Moderate assistance Where Assessed-Upper Body Bathing: Sitting at sink Lower Body Bathing: Maximal assistance Where Assessed-Lower Body Bathing: Sitting at sink Upper Body Dressing: Moderate assistance Where Assessed-Upper Body Dressing: Sitting at sink Lower Body Dressing: Maximal assistance Where Assessed-Lower Body Dressing: Sitting at sink    Therapy/Group: Individual Therapy  [name] 01/04/2024, 9:20 AM

## 2024-01-04 NOTE — Progress Notes (Signed)
 Given PRN pain medication for shoulder pain. Left AC joint and clavicle more pronounced this evening. Position changed of bone and lower. Increase in saliva. Speech slurred. Pupils are equal and reactive. Oval shape right pupil briefly. Altered mental status. Asking to leave to taking care of children.

## 2024-01-04 NOTE — Progress Notes (Signed)
 Physical Therapy Session Note  Patient Details  Name: Troy Bishop MRN: [id number] Date of Birth: July 02, 1991  Today's Date: 01/04/2024 PT Individual Time: 4098-1191; 1311 - 1408  PT Individual Time Calculation (min): 30 min; 57 min   Short Term Goals: Week 1:  PT Short Term Goal 1 (Week 1): Pt will transfer sup to sit w/ mod A consistently. PT Short Term Goal 2 (Week 1): Pt will maintain seated balance x 5' w/ CGA PT Short Term Goal 3 (Week 1): Pt will transfer sit to stand w/ mod A + 1 PT Short Term Goal 4 (Week 1): PT to assess gait.  SESSION 1 Skilled Therapeutic Interventions/Progress Updates: Patient supine in bed with father present on entrance to room. Patient alert and agreeable to PT session.   Patient reported unrated pain in L lateral thigh (manual therapy provided with pt reporting decrease in pain). Pt with soiled brief on floor and personal shorts soiled as well (donned on person). Pt cued to roll to R/L from supine with minA overall. Pt required maxA to donn hospital pants/brief. PTA and tech adjusted pt's back to TIS to assist with pts comfort for OOB sitting tolerance between therapy sessions.  Palpation of L glute performed with trigger points noted. Education and rationale provided with pt agreeing to participate in intervention. - Trigger point release to piriformis with soft tissue mobilization to follow throughout.  Pt has decreased awareness of current deficits, and stated that he could go to the toilet/do things on his own as pt has started to gain some activation in L LE. PTA provided pt with comfort and understanding that pt wants to get back to being able to provide for his family, but that this process will take time, and that pt still requires a lot of assistance, and to not get discouraged as pt d/c is still weeks away, and that therapy staff working with pt will do all that we can to assist with pt's progression (pt seemingly understood).   Patient supine in bed at  end of session with brakes locked, bed alarm set, and all needs within reach.  SESSION 2 Skilled Therapeutic Interventions/Progress Updates: Patient sitting in TIS with wife present on entrance to room. Patient alert and agreeable to PT session.   Patient with no complaints of pain (stated manual therapy earlier in day assisted with pt's pain/discomfort L hip/lateral thigh).  Pt impulsive at beginning of session with max cuing to wait to stand or move until PTA gives cue. PTA discussed with pt about impulsiveness, and how pt wants to do as much as able as quick as able to progress, but that it can increase risk of falls and injury (pt understood after several repetitions of same talk with addition to channel impulsiveness/drive to direction of interventions/mind-muscle connection and control). Pt also stated that he wants to return to work next Monday. PTA discussed with pt that pt will need intensive rehab to decrease burden of care, and to increase functional independence, and to take it one step at a time (pt seemingly understanding towards middle of session).   Therapeutic Activity: Transfers: Pt performed sit<>stands (in preparation for interventions/ambulation) throughout session with CGA to stand, and maxA to stabilize with L knee block. Pt performed sit<>stand in STEDY with CGA at beginning of session to transfer to toilet to have BM (charted)  Neuromuscular Re-ed: NMR facilitated during session with focus on neuromuscular control/connection, proprioceptive feedback L LE. Pt ambulated 35' using LHR (TIS follow) outside of main  gym with maxA (personal shoes donned). At first, pt proceeded to advance R LE despite cues to wait for PTA to ready L LE (continued to advance). Pt required safety communication due to impulsiveness, and that pt will need to follow PTA commands to safely navigate distance, and if deemed unsafe, ambulation will be deferred for later session (pt decreased impulsiveness  following). Pt demonstrated the following gait deviations with therapist providing the described cuing and facilitation for improvement:  - decreased coordination of L LE through swing/stance  - Decreased quadriceps activation vs hip flexors (able to advance LE through swing with minA, but requires maxA for stance phase/coordinating placement of LE on floor safely).  - Decreased ankle AROM (will need AFO)  - MaxA to prevent buckling L knee  - Pt sitting in TIS (tilted back) with cues to perform LAQ. Pt with overactivation of L hip flexors vs quadriceps that required theraband pulling into hip extension to compensate (attempted band pulling into flexion for tactile feedback to maintain LE on seat, but pt unable to coordinate). Pt cued to control eccentric (PTA passively moved LE into knee extension - pt engages quadriceps activation after less than half ROM that still required mod/maxA to control). Pt also cued to slow speed of movement as pt attempts to swing LE to gain momentum (pt understood that controlling movement will increase pt's neuromuscular connection - wife educated if pt wants to perform in room that pt will need assistance, to doff L leg rest, place pillow on L leg rest bar, and to ensure pt is focusing on control ROM).  - Pt static stance with mirror feedback and PTA on L maxA to maintain L knee in extension, and R HHA from tech. Pt maxA to maintain standing balance and cues to shift weight to R per L lean (unable to do without manual facilitation). Pt then performed x 15 mini squats maxA with tactile cuing to engage L quadriceps, then cued to remain standing (after seated rest break) to maintain knee extension (few moments of modA to obtain, but maxA following).  NMR performed for improvements in motor control and coordination, balance, sequencing, judgement, and self confidence/ efficacy in performing all aspects of mobility at highest level of independence.   Patient sitting in TIS at  end of session with brakes locked, wife present, belt alarm set, and all needs within reach.       Therapy Documentation Precautions:  Precautions Precautions: Fall Precaution/Restrictions Comments: L hemi, neglect Restrictions Weight Bearing Restrictions Per Provider Order: No  Therapy/Group: Individual Therapy  Lehua Flores PTA 01/04/2024, 12:27 PM

## 2024-01-04 NOTE — Plan of Care (Signed)
  Problem: RH BOWEL ELIMINATION Goal: RH STG MANAGE BOWEL WITH ASSISTANCE Description: STG Manage Bowel with supervision Assistance. Outcome: Progressing   Problem: RH BLADDER ELIMINATION Goal: RH STG MANAGE BLADDER WITH ASSISTANCE Description: STG Manage Bladder With  supervision Assistance Outcome: Progressing   Problem: RH SKIN INTEGRITY Goal: RH STG SKIN FREE OF INFECTION/BREAKDOWN Description: Manage skin free of infection/breakdown with supervision assistance Outcome: Progressing   Problem: RH SAFETY Goal: RH STG ADHERE TO SAFETY PRECAUTIONS W/ASSISTANCE/DEVICE Description: STG Adhere to Safety Precautions With supervision  Assistance/Device. Outcome: Progressing Flowsheets (Taken 01/04/2024 1331) STG:Pt will adhere to safety precautions with assistance/device: 4-Minimal assistance   Problem: RH PAIN MANAGEMENT Goal: RH STG PAIN MANAGED AT OR BELOW PT'S PAIN GOAL Description: < 4 w/prns Outcome: Progressing

## 2024-01-04 NOTE — Progress Notes (Signed)
 Family in room assisting patient with lunch. Food brought in from home. Orientation x 2 to 3. Thought process needing to be reoriented and intermittently poor reality based orientation. Talking about writer being involved with the police overnight here. Continues to be impulsive and needing multiple prompts & redirection when ambulating. Denies headache at this time. Does endorse intermittently distortion with vision, but patient having difficulty explaining outside of issue with his vision. Reports itchiness of the eyes.

## 2024-01-05 DIAGNOSIS — I619 Nontraumatic intracerebral hemorrhage, unspecified: Secondary | ICD-10-CM | POA: Diagnosis not present

## 2024-01-05 DIAGNOSIS — I10 Essential (primary) hypertension: Secondary | ICD-10-CM | POA: Diagnosis not present

## 2024-01-05 DIAGNOSIS — M25552 Pain in left hip: Secondary | ICD-10-CM | POA: Diagnosis not present

## 2024-01-05 DIAGNOSIS — R7401 Elevation of levels of liver transaminase levels: Secondary | ICD-10-CM | POA: Diagnosis not present

## 2024-01-05 NOTE — Progress Notes (Signed)
 Occupational Therapy Session Note  Patient Details  Name: Troy Bishop MRN: [id number] Date of Birth: July 29, 1991  Today's Date: 01/05/2024 OT Individual Time: 1005-1050 OT Individual Time Calculation (min): 45 min (unattended estim 1050-1150 = 60 min)   Short Term Goals: Week 1:  OT Short Term Goal 1 (Week 1): Pt will maintain sitting balance on toilet with min A with max cues OT Short Term Goal 2 (Week 1): Pt will don shirt with mod A with cues OT Short Term Goal 3 (Week 1): Pt will attend to left visual field to obtain items for ADL with min questioning cuing OT Short Term Goal 4 (Week 1): Pt will transfer with mod A +1 consistently  Skilled Therapeutic Interventions/Progress Updates:    Pt received in w/c dressed. His clothes were sitting on bed and suggested he bathe and change clothing. Pt kept stating he went home this morning and showered. Reoriented pt.  Pt quite convinced he did not need to shower or bathe.  Pt taken to gym to work on standing and LUE wt bearing onto hilow table.   +2 A from rehab tech to support pt on R side while therapist supported L leg.  L wt bearing with reaching with R hand.  Pt tolerated standing for over 15 min at table.  Worked on active hand sliding lateral to medial.    -from sitting pt worked arm slides on table with guiding A.  Trace movement noted in arm as pt was able to pull arm toward body to hit a target on the table.   -pt stated he wanted to wash hair.   His wife called and said pt had not been bathed today. Time only allowed to wash hair and UB at the sink.  NT will assist with LB later today.  Placed estim on forearm for finger extension. Pt able to tolerate intensity 24.  Left estim on unattended for 60 minutes.  Removed with no adverse affects.     Therapy Documentation Precautions:  Precautions Precautions: Fall Precaution/Restrictions Comments: L hemi, neglect Restrictions Weight Bearing Restrictions Per Provider Order: No   Pain: Pain  Assessment Pain Score: 0-No pain ADL: ADL Eating: Moderate assistance Grooming: Minimal assistance Upper Body Bathing: Moderate assistance Where Assessed-Upper Body Bathing: Sitting at sink Lower Body Bathing: Maximal assistance Where Assessed-Lower Body Bathing: Sitting at sink Upper Body Dressing: Moderate assistance Where Assessed-Upper Body Dressing: Sitting at sink Lower Body Dressing: Maximal assistance Where Assessed-Lower Body Dressing: Sitting at sink      Therapy/Group: Individual Therapy  Maebelle Sulton 01/05/2024, 12:47 PM

## 2024-01-05 NOTE — Progress Notes (Addendum)
 PROGRESS NOTE   Subjective/Complaints:  Had another pretty good night. Denies pain today. Eyes are feeling better. C/o drooling on left. Wondering why his arm isn't showing much improvement. Still don't see PRAFO in room. Pt says it hasn't been put on.   ROS: Patient denies fever, rash, sore throat, blurred vision, dizziness, nausea, vomiting, diarrhea, cough, shortness of breath or chest pain, joint or back/neck pain, headache, or mood change.   Objective:   No results found. No results for input(s): "WBC", "HGB", "HCT", "PLT" in the last 72 hours.  No results for input(s): "NA", "K", "CL", "CO2", "GLUCOSE", "BUN", "CREATININE", "CALCIUM " in the last 72 hours.       Intake/Output Summary (Last 24 hours) at 01/05/2024 1013 Last data filed at 01/04/2024 2125 Gross per 24 hour  Intake 580 ml  Output --  Net 580 ml        Physical Exam: Vital Signs Blood pressure 107/71, pulse 75, temperature 98.2 F (36.8 C), temperature source Oral, resp. rate 18, height 5\' 7"  (1.702 m), weight 62.9 kg, SpO2 95%.  Constitutional: No distress . Vital signs reviewed. HEENT: NCAT, EOMI, oral membranes moist Neck: supple Cardiovascular: RRR without murmur. No JVD    Respiratory/Chest: CTA Bilaterally without wheezes or rales. Normal effort    GI/Abdomen: BS +, non-tender, non-distended Ext: no clubbing, cyanosis, or edema Psych: pleasant and cooperative   Skin: intact over exposed surfaces Neuro: attention a little better, Alert and oriented x3  Left facial droop. Speech remains dysarthric LLE 1+/5 HE and HF, tr-1 KE, and distally is 0/5, LUE 0/5 -->perhaps trace in fingers? Right sided strength is 5/5. Decreased sensation throughout left side although can sense gross touch and pain. Increased DTR's LLE with ongoing sustained clonus at ankle. Heel cord tight but can be ranged to neutral. NO PRAFO Musc: no back pain or hip pain  today   Assessment/Plan: 1. Functional deficits which require 3+ hours per day of interdisciplinary therapy in a comprehensive inpatient rehab setting. Physiatrist is providing close team supervision and 24 hour management of active medical problems listed below. Physiatrist and rehab team continue to assess barriers to discharge/monitor patient progress toward functional and medical goals  Care Tool:  Bathing    Body parts bathed by patient: Chest, Abdomen, Right upper leg, Left upper leg, Face, Front perineal area, Left arm   Body parts bathed by helper: Right arm, Buttocks, Left lower leg, Right lower leg     Bathing assist Assist Level: Maximal Assistance - Patient 24 - 49%     Upper Body Dressing/Undressing Upper body dressing   What is the patient wearing?: Pull over shirt    Upper body assist Assist Level: Moderate Assistance - Patient 50 - 74%    Lower Body Dressing/Undressing Lower body dressing      What is the patient wearing?: Underwear/pull up, Pants     Lower body assist Assist for lower body dressing: Maximal Assistance - Patient 25 - 49% (bed level)     Toileting Toileting    Toileting assist Assist for toileting: 2 Helpers     Transfers Chair/bed transfer  Transfers assist  Chair/bed transfer activity did not occur:  Safety/medical concerns  Chair/bed transfer assist level: 2 Helpers     Locomotion Ambulation   Ambulation assist   Ambulation activity did not occur: Safety/medical concerns  Assist level: Maximal Assistance - Patient 25 - 49% Assistive device:  (RHR) Max distance: 35   Walk 10 feet activity   Assist  Walk 10 feet activity did not occur: Safety/medical concerns  Assist level: Maximal Assistance - Patient 25 - 49% Assistive device:  (RHR)   Walk 50 feet activity   Assist Walk 50 feet with 2 turns activity did not occur: Safety/medical concerns         Walk 150 feet activity   Assist Walk 150 feet activity  did not occur: Safety/medical concerns         Walk 10 feet on uneven surface  activity   Assist Walk 10 feet on uneven surfaces activity did not occur: Safety/medical concerns         Wheelchair     Assist Is the patient using a wheelchair?: Yes Type of Wheelchair: Manual    Wheelchair assist level: Dependent - Patient 0% Max wheelchair distance: 150    Wheelchair 50 feet with 2 turns activity    Assist        Assist Level: Dependent - Patient 0%   Wheelchair 150 feet activity     Assist      Assist Level: Dependent - Patient 0%   Blood pressure 107/71, pulse 75, temperature 98.2 F (36.8 C), temperature source Oral, resp. rate 18, height 5\' 7"  (1.702 m), weight 62.9 kg, SpO2 95%.  Medical Problem List and Plan: 1. Functional deficits secondary to right basal ganglia large ICH             -patient may shower             -ELOS/Goals: 12-16 days S             -pt with emerging tone in LLE-- awaiting PRAFO to help control foot/ankle.  Don't think we need antispasmodics yet. 5/30 following up with [employer] re: delivery of brace  -5/30 reviewed patterns of recover after stroke, discussed maintaining ROM, joint positioning, etc to optimize potential for recovery. Also discussed his drooling. I would prefer not to try to treat that as the treatments might cause greater side effects than the drooling itself. Encouraged active swallowing and monitoring, keeping a towel handy. Sx should gradually improve. 2.  Antithrombotics: -DVT/anticoagulation:  Pharmaceutical: SCDs, heparin  5000U q8h             -antiplatelet therapy: N/A  3. Pain Management: Tylenol  prn. Continue gabapentin 200mg  BID -  Oxycodone  2.5mg  q6h PRN     5/28 having intermittent muscular pain it appears, left hip pain better   -continue above as well as heat/ice/rom/oob  5/30 pain improved. 4. Mood/Behavior/Sleep: LCSW to follow for evaluation and support.              -antipsychotic  agents: N/A  -Seroquel    Ativan  0.5mg  q6h PRN, melatonin 5mg  PRN  -5/29-   seroquel  reduced to at bedtime only  -5/30 if he does well this weekend, can change seroquel  to at bedtime prn.  He already is much brighter with reduction to HS    -he is also using melatonin prn which we could schedule 5. Neuropsych/cognition: This patient is not capable of making decisions on his own behalf.  6. Skin/Wound Care: Routine pressure relief measures.   7. Fluids/Electrolytes/Nutrition: Monitor I/O. Monitor routine labs. Continue  vitamins/supplements.  -Dys 3 diet with thins (SLP to eval)   -Intake improved.  Does better with food from home as well. 8. Metabolic encephalopathy/ETOH withdrawal: Has resolved, wean seroquel  and ativan  as tolerated. Continue lactulose  30g BID   5/27 ammonia level 23  9. Abnormal LFTs:  AST/ALT trending down.    -recheck Monday 6/2   10. ABLA?: Hgb 12's, monitor Hgb weekly   11. HTN: no meds PTA, started on amlodipine  10mg  daily, coreg  25mg  BID, losartan  100mg  daily, hydralazine  PRN, labetalol  PRN. SBP goal <160  -01/05/24 BPs generally controlled.  monitor on current regimen with increased mobility  Vitals:   01/01/24 1634 01/01/24 1956 01/02/24 0605 01/02/24 2011  BP: 115/75 131/68 112/75 115/75   01/03/24 1509 01/03/24 1953 01/04/24 0600 01/04/24 0942  BP: 106/64 113/66 117/80 127/83   01/04/24 1414 01/04/24 1815 01/04/24 2122 01/05/24 0508  BP: 110/68 106/73 102/67 107/71      12. Tobacco abuse: continue nicoderm patches.   13. GI ppx: continue Protonix  40mg  daily  14. Bowel management: continue Miralax  daily  -  LBM 5/29 , is now continent  -5/30 stools liquidy---back off miralax   15. HLD: LDL 124, goal LDL <70, per d/c summary, start atorvastatin  40mg  once LFTs downtrend and repeat in 1 week -5/27 LFT's better yesterday -resumed atorvastatin    -5/30 recheck cmet Monday   LOS: 6 days A FACE TO FACE EVALUATION WAS PERFORMED  Rawland Caddy 01/05/2024, 10:13 AM

## 2024-01-05 NOTE — Plan of Care (Signed)
  Problem: Consults Goal: RH STROKE PATIENT EDUCATION Description: See Patient Education module for education specifics  Outcome: Progressing   Problem: RH BOWEL ELIMINATION Goal: RH STG MANAGE BOWEL WITH ASSISTANCE Description: STG Manage Bowel with supervision Assistance. Outcome: Progressing   Problem: RH BLADDER ELIMINATION Goal: RH STG MANAGE BLADDER WITH ASSISTANCE Description: STG Manage Bladder With  supervision Assistance Outcome: Progressing   Problem: RH SKIN INTEGRITY Goal: RH STG SKIN FREE OF INFECTION/BREAKDOWN Description: Manage skin free of infection/breakdown with supervision assistance Outcome: Progressing   Problem: RH SAFETY Goal: RH STG ADHERE TO SAFETY PRECAUTIONS W/ASSISTANCE/DEVICE Description: STG Adhere to Safety Precautions With supervision  Assistance/Device. Outcome: Progressing   Problem: RH PAIN MANAGEMENT Goal: RH STG PAIN MANAGED AT OR BELOW PT'S PAIN GOAL Description: < 4 w/prns Outcome: Progressing   Problem: RH KNOWLEDGE DEFICIT Goal: RH STG INCREASE KNOWLEDGE OF HYPERTENSION Description: Manage increase knowledge of hypertension with supervision assistance from wife using educational materials provided Outcome: Progressing

## 2024-01-05 NOTE — Progress Notes (Signed)
 Orthopedic Tech Progress Note Patient Details:  Troy Bishop 1990-09-04 [id number]  Ortho Devices Type of Ortho Device: Prafo boot/shoe Ortho Device/Splint Location: Resting hand splint Ortho Device/Splint Interventions: Ordered  Pt. Sleeping with wife at bedside. Left orders in room. Advised nurse that it was delivered. Post Interventions Patient Tolerated: Other (comment)  Troy Bishop 01/05/2024, 3:56 PM

## 2024-01-05 NOTE — Progress Notes (Signed)
 Physical Therapy Session Note  Patient Details  Name: Troy Bishop MRN: [id number] Date of Birth: [birth year]  Today's Date: 01/05/2024 PT Individual Time: 1104-1202; 1355 - 1422 PT Individual Time Calculation (min): 58 min; 27 min   Short Term Goals: Week 1:  PT Short Term Goal 1 (Week 1): Pt will transfer sup to sit w/ mod A consistently. PT Short Term Goal 2 (Week 1): Pt will maintain seated balance x 5' w/ CGA PT Short Term Goal 3 (Week 1): Pt will transfer sit to stand w/ mod A + 1 PT Short Term Goal 4 (Week 1): PT to assess gait.  SESSION 1 Skilled Therapeutic Interventions/Progress Updates: Patient sitting in recliner on entrance to room. Patient alert and agreeable to PT session.   Patient reported unrated pain/discomfort in mid/lateral back musculature (mobility out of chair provided - will also further investigate during afternoon session).   Gait Training:  Pt ambulated 35' x 1 using RHR with maxA overall. Pt with decreased impulsiveness this session vs previous session, and followed commands appropriately. Pt with L DF wrap donned (personal shoes as well). Pt able to perform hip flexion during swing when cued, and lacks adequate quadriceps contraction to stabilize during stance and to bring LE through full swing without maxA to place on floor.   Neuromuscular Re-ed: NMR facilitated during session with focus on weight shifting, proprioceptive feedback, postural control. - Static standing balance with mirror feedback (maxA to maintain L knee in extension) and cues to shift weight to R per L lean (to bring hips to tech on R providing CGA R HHA vs laterally flexing trunk).  - Mini squats maxA to maintain dynamic standing balance and L knee extension (tactile feedback).  NMR performed for improvements in motor control and coordination, balance, sequencing, judgement, and self confidence/ efficacy in performing all aspects of mobility at highest level of independence.   Applied EMPI  e-stim device set on NMES L Atrophy to L LE quadriceps (VMO/rectus femoris) - intensity 55 with palpable muscle contraction - after completion of e-stim, pt denies any negative side effects with skin intact and no adverse side effects noted. Pt cued to increase mind-muscle connection during contraction of musculature (target cue to PTA hand), and to decrease hip flexion compensation (able to achieve without assistance shortly after PTA provided hand on knee to decrease).  Patient sitting in TIS at end of session with brakes locked, belt alarm set, and all needs within reach.  SESSION 2 Skilled Therapeutic Interventions/Progress Updates: Patient sitting in TIS with family present on entrance to room. Patient alert and agreeable to PT session.   Patient with no complaints of pain during session (PTA educated on positioning in TIS by avoiding slouched position, and to scoot back in seat when needing to - wife present as well). Pt transported to main gym hallway in TIS and performed NMRE bellow.  Neuromuscular Re-ed: NMR facilitated during session with focus on neuromuscular connection/control, weight shifting, dynamic standing balance. - Step to 2" step with L LE (R UE supported on railing and DF wrap donned L LE). Pt with CGA to stand and minA while standing (lack of knee extension with PTA blocking buckle). Pt cued to bring knee up to PTA hand to increase hip flexion when stepping off vs sliding. Pt required maxA to safely place on to step and back to neutral, but able to self advance without assistance. Pt performed until fatigue with added cuing to lean to R vs L, and to decrease  push off railing with R UE. - Pt cued to perform LAQ with L LE while decreasing hip flexor activation/compensation to move. Pt able to perform after cued to slow down movement, and to control vs using momentum. Pt also cued to control eccentric while PTA passively moved LE into knee extension (maxA close to half way of  beginning part of movement).   NMR performed for improvements in motor control and coordination, balance, sequencing, judgement, and self confidence/ efficacy in performing all aspects of mobility at highest level of independence.   Patient sitting in TIS at end of session with brakes locked, wife present, bed alarm set, and all needs within reach.       Therapy Documentation Precautions:  Precautions Precautions: Fall Precaution/Restrictions Comments: L hemi, neglect Restrictions Weight Bearing Restrictions Per Provider Order: No  Therapy/Group: Individual Therapy  Kemari Narez PTA 01/05/2024, 12:43 PM

## 2024-01-05 NOTE — Progress Notes (Signed)
 Speech Language Pathology Daily Session Note  Patient Details  Name: Troy Bishop MRN: [id number] Date of Birth: 07/21/1991  Today's Date: 01/05/2024 SLP Individual Time: 0901-0959 SLP Individual Time Calculation (min): 58 min  Short Term Goals: Week 1: SLP Short Term Goal 1 (Week 1): Pt will use safe swallow strategies (small bites/sips, slow rate, lingual sweep) when consuming Dys 3/thin diet to reduce the risk of aspiration/choking. SLP Short Term Goal 2 (Week 1): Pt will use compensatory memory strategies to increase recall of functional information up to 3 minutes when provided verbal cueing and external memory aids. SLP Short Term Goal 3 (Week 1): Pt will name at least 1 cognitive/physical change and how it will functionally impact him to demonstrate increased awareness of deficits and increase reasoning/safety. SLP Short Term Goal 4 (Week 1): Pt will complete verbal safety/reasoning tasks with 60% acc when provided verbal cueing. SLP Short Term Goal 5 (Week 1): Pt will increase speech intelligibility at the sentence level to 80% using compensatory strategies when provided fading verbal cueing.  Skilled Therapeutic Interventions: Skilled therapy session focused on cognitive and dysarthria goals. SLP facilitated session by prompting patient to identify dates on the calendar. Patient required mod occasional maxA to identify dates on the L and recall directions from SLP. Patient continues to present with poor sustained attention requiring continuous cues to attend to problem solving tasks, though improved from yesterday as patient did not have phone. SLP targeted dysarthria through review of speech intelligibility strategies. Patient independently recalled "slow" and "loud" however required re-education on remainder. SLP provided modA for patient to utilize strategies at the word/short sentence level to reach 75-80% intelligibility. Patient left in [facility] with alarm set and call bell in reach. Continue  POC.   Pain None reported   Therapy/Group: Individual Therapy  Courtnay Petrilla M.A., CCC-SLP 01/05/2024, 7:38 AM

## 2024-01-05 NOTE — Plan of Care (Signed)
  Problem: Consults Goal: RH STROKE PATIENT EDUCATION Description: See Patient Education module for education specifics  01/05/2024 0012 by Tawanna Faster, RN Outcome: Progressing 01/05/2024 0007 by Tawanna Faster, RN Outcome: Progressing   Problem: RH BOWEL ELIMINATION Goal: RH STG MANAGE BOWEL WITH ASSISTANCE Description: STG Manage Bowel with supervision Assistance. 01/05/2024 0012 by Tawanna Faster, RN Outcome: Progressing 01/05/2024 0007 by Tawanna Faster, RN Outcome: Progressing   Problem: RH BLADDER ELIMINATION Goal: RH STG MANAGE BLADDER WITH ASSISTANCE Description: STG Manage Bladder With  supervision Assistance 01/05/2024 0012 by Tawanna Faster, RN Outcome: Progressing 01/05/2024 0007 by Tawanna Faster, RN Outcome: Progressing   Problem: RH SKIN INTEGRITY Goal: RH STG SKIN FREE OF INFECTION/BREAKDOWN Description: Manage skin free of infection/breakdown with supervision assistance 01/05/2024 0012 by Tawanna Faster, RN Outcome: Progressing 01/05/2024 0007 by Tawanna Faster, RN Outcome: Progressing   Problem: RH SAFETY Goal: RH STG ADHERE TO SAFETY PRECAUTIONS W/ASSISTANCE/DEVICE Description: STG Adhere to Safety Precautions With supervision  Assistance/Device. 01/05/2024 0012 by Tawanna Faster, RN Outcome: Progressing 01/05/2024 0007 by Tawanna Faster, RN Outcome: Progressing   Problem: RH PAIN MANAGEMENT Goal: RH STG PAIN MANAGED AT OR BELOW PT'S PAIN GOAL Description: < 4 w/prns 01/05/2024 0012 by Tawanna Faster, RN Outcome: Progressing 01/05/2024 0007 by Tawanna Faster, RN Outcome: Progressing   Problem: RH KNOWLEDGE DEFICIT Goal: RH STG INCREASE KNOWLEDGE OF HYPERTENSION Description: Manage increase knowledge of hypertension with supervision assistance from wife using educational materials provided 01/05/2024 0012 by Tawanna Faster, RN Outcome:  Progressing 01/05/2024 0007 by Tawanna Faster, RN Outcome: Progressing Goal: RH STG INCREASE KNOWLEDGE OF DYSPHAGIA/FLUID INTAKE Outcome: Progressing Goal: RH STG INCREASE KNOWLEGDE OF HYPERLIPIDEMIA Description: Manage increase knowledge of hyperlipidemia  with supervision assistance from wife using educational materials provided Outcome: Progressing Goal: RH STG INCREASE KNOWLEDGE OF STROKE PROPHYLAXIS Description: Manage increase knowledge of stroke prophylaxis with supervision assistance from wife using educational materials provided Outcome: Progressing

## 2024-01-06 DIAGNOSIS — I10 Essential (primary) hypertension: Secondary | ICD-10-CM | POA: Diagnosis not present

## 2024-01-06 DIAGNOSIS — I619 Nontraumatic intracerebral hemorrhage, unspecified: Secondary | ICD-10-CM | POA: Diagnosis not present

## 2024-01-06 DIAGNOSIS — E785 Hyperlipidemia, unspecified: Secondary | ICD-10-CM | POA: Diagnosis not present

## 2024-01-06 DIAGNOSIS — D62 Acute posthemorrhagic anemia: Secondary | ICD-10-CM | POA: Diagnosis not present

## 2024-01-06 MED ORDER — QUETIAPINE FUMARATE 25 MG PO TABS
25.0000 mg | ORAL_TABLET | Freq: Every evening | ORAL | Status: DC | PRN
  Filled 2024-01-06: qty 1

## 2024-01-06 MED ORDER — MELATONIN 5 MG PO TABS
5.0000 mg | ORAL_TABLET | Freq: Every day | ORAL | Status: DC
  Administered 2024-01-06 – 2024-01-27 (×19): 5 mg via ORAL
  Filled 2024-01-06 (×24): qty 1

## 2024-01-06 NOTE — Progress Notes (Signed)
 PROGRESS NOTE   Subjective/Complaints:  Pt doing ok, slept ok, pain well managed, LBM this morning, urinating fine. No other complaints or concerns.   ROS: as per HPI. Denies CP, SOB, abd pain, N/V/D/C, or any other complaints at this time.    Objective:   No results found. No results for input(s): "WBC", "HGB", "HCT", "PLT" in the last 72 hours.  No results for input(s): "NA", "K", "CL", "CO2", "GLUCOSE", "BUN", "CREATININE", "CALCIUM " in the last 72 hours.       Intake/Output Summary (Last 24 hours) at 01/06/2024 1336 Last data filed at 01/06/2024 0825 Gross per 24 hour  Intake 236 ml  Output 650 ml  Net -414 ml        Physical Exam: Vital Signs Blood pressure 106/77, pulse 82, temperature 98.5 F (36.9 C), resp. rate 18, height 5\' 7"  (1.702 m), weight 65.5 kg, SpO2 97%.  Constitutional: No distress . Vital signs reviewed. Resting comfortably in bed.  HEENT: NCAT, EOMI, oral membranes moist, slight facial droop L side Neck: supple Cardiovascular: RRR without murmur. No JVD    Respiratory/Chest: CTA Bilaterally without wheezes or rales. Normal effort    GI/Abdomen: BS +, non-tender, non-distended, soft Ext: no clubbing, cyanosis, or edema Psych: pleasant and cooperative   Skin: intact over exposed surfaces Neuro: L facial droop, speech dysarthric but intelligible, LUE/LLE weakness  PRIOR EXAMS: Neuro: attention a little better, Alert and oriented x3  Left facial droop. Speech remains dysarthric LLE 1+/5 HE and HF, tr-1 KE, and distally is 0/5, LUE 0/5 -->perhaps trace in fingers? Right sided strength is 5/5. Decreased sensation throughout left side although can sense gross touch and pain. Increased DTR's LLE with ongoing sustained clonus at ankle. Heel cord tight but can be ranged to neutral. NO PRAFO Musc: no back pain or hip pain today   Assessment/Plan: 1. Functional deficits which require 3+ hours per day  of interdisciplinary therapy in a comprehensive inpatient rehab setting. Physiatrist is providing close team supervision and 24 hour management of active medical problems listed below. Physiatrist and rehab team continue to assess barriers to discharge/monitor patient progress toward functional and medical goals  Care Tool:  Bathing    Body parts bathed by patient: Chest, Abdomen, Right upper leg, Left upper leg, Face, Front perineal area, Left arm   Body parts bathed by helper: Right arm, Buttocks, Left lower leg, Right lower leg     Bathing assist Assist Level: Maximal Assistance - Patient 24 - 49%     Upper Body Dressing/Undressing Upper body dressing   What is the patient wearing?: Pull over shirt    Upper body assist Assist Level: Moderate Assistance - Patient 50 - 74%    Lower Body Dressing/Undressing Lower body dressing      What is the patient wearing?: Underwear/pull up, Pants     Lower body assist Assist for lower body dressing: Maximal Assistance - Patient 25 - 49% (bed level)     Toileting Toileting    Toileting assist Assist for toileting: 2 Helpers     Transfers Chair/bed transfer  Transfers assist  Chair/bed transfer activity did not occur: Safety/medical concerns  Chair/bed transfer assist level:  2 Helpers     Locomotion Ambulation   Ambulation assist   Ambulation activity did not occur: Safety/medical concerns  Assist level: Maximal Assistance - Patient 25 - 49% Assistive device:  (RHR) Max distance: 35   Walk 10 feet activity   Assist  Walk 10 feet activity did not occur: Safety/medical concerns  Assist level: Maximal Assistance - Patient 25 - 49% Assistive device:  (RHR)   Walk 50 feet activity   Assist Walk 50 feet with 2 turns activity did not occur: Safety/medical concerns         Walk 150 feet activity   Assist Walk 150 feet activity did not occur: Safety/medical concerns         Walk 10 feet on uneven  surface  activity   Assist Walk 10 feet on uneven surfaces activity did not occur: Safety/medical concerns         Wheelchair     Assist Is the patient using a wheelchair?: Yes Type of Wheelchair: Manual    Wheelchair assist level: Dependent - Patient 0% Max wheelchair distance: 150    Wheelchair 50 feet with 2 turns activity    Assist        Assist Level: Dependent - Patient 0%   Wheelchair 150 feet activity     Assist      Assist Level: Dependent - Patient 0%   Blood pressure 106/77, pulse 82, temperature 98.5 F (36.9 C), resp. rate 18, height 5\' 7"  (1.702 m), weight 65.5 kg, SpO2 97%.  Medical Problem List and Plan: 1. Functional deficits secondary to right basal ganglia large ICH             -patient may shower             -ELOS/Goals: 12-16 days S -pt with emerging tone in LLE-- awaiting PRAFO to help control foot/ankle.  Don't think we need antispasmodics yet. 5/30 following up with [employer] re: delivery of brace -5/30 reviewed patterns of recover after stroke, discussed maintaining ROM, joint positioning, etc to optimize potential for recovery. Also discussed his drooling. I would prefer not to try to treat that as the treatments might cause greater side effects than the drooling itself. Encouraged active swallowing and monitoring, keeping a towel handy. Sx should gradually improve. 2.  Antithrombotics: -DVT/anticoagulation:  Pharmaceutical: SCDs, heparin  5000U q8h             -antiplatelet therapy: N/A  3. Pain Management: Tylenol  prn. Continue gabapentin  200mg  BID -  Oxycodone  2.5mg  q6h PRN     5/28 having intermittent muscular pain it appears, left hip pain better   -continue above as well as heat/ice/rom/oob  5/30 pain improved. 4. Mood/Behavior/Sleep: LCSW to follow for evaluation and support.              -antipsychotic agents: N/A  -Seroquel    Ativan  0.5mg  q6h PRN, melatonin 5mg  PRN  -5/29-   seroquel  reduced to at bedtime  only -5/30 if he does well this weekend, can change seroquel  to at bedtime prn.  He already is much brighter with reduction to HS    -he is also using melatonin prn which we could schedule -01/06/24 will change seroquel  to at bedtime PRN and schedule melatonin at bedtime; asked nursing to notify pt 5. Neuropsych/cognition: This patient is not capable of making decisions on his own behalf.  6. Skin/Wound Care: Routine pressure relief measures.   7. Fluids/Electrolytes/Nutrition: Monitor I/O. Monitor routine labs. Continue vitamins/supplements.  -Dys 3 diet with  thins (SLP to eval)   -Intake improved.  Does better with food from home as well. 8. Metabolic encephalopathy/ETOH withdrawal: Has resolved, wean seroquel  and ativan  as tolerated. Continue lactulose  30g BID   5/27 ammonia level 23  9. Abnormal LFTs:  AST/ALT trending down.    -recheck Monday 6/2   10. ABLA?: Hgb 12's, monitor Hgb weekly   11. HTN: no meds PTA, started on amlodipine  10mg  daily, coreg  25mg  BID, losartan  100mg  daily, hydralazine  PRN, labetalol  PRN. SBP goal <160 -01/05/24 BPs generally controlled.  monitor on current regimen with increased mobility -01/06/24 BPs great, d/c'd hydralazine /labetalol  PRNs  Vitals:   01/03/24 1509 01/03/24 1953 01/04/24 0600 01/04/24 0942  BP: 106/64 113/66 117/80 127/83   01/04/24 1414 01/04/24 1815 01/04/24 2122 01/05/24 0508  BP: 110/68 106/73 102/67 107/71   01/05/24 1251 01/05/24 2011 01/06/24 0529 01/06/24 1257  BP: 104/68 108/78 104/65 106/77      12. Tobacco abuse: continue nicoderm patches.   13. GI ppx: continue Protonix  40mg  daily  14. Bowel management: continue Miralax  daily  -  LBM 5/29 , is now continent  -5/30 stools liquidy---back off miralax  -01/06/24 LBM this morning, not sure what type since not documented; monitor for now  15. HLD: LDL 124, goal LDL <70, per d/c summary, start atorvastatin  40mg  once LFTs downtrend and repeat in 1 week -5/27 LFT's better  yesterday -resumed atorvastatin    -5/30 recheck cmet Monday    LOS: 7 days A FACE TO FACE EVALUATION WAS PERFORMED  [address] 01/06/2024, 1:36 PM

## 2024-01-06 NOTE — Plan of Care (Signed)
  Problem: Consults Goal: RH STROKE PATIENT EDUCATION Description: See Patient Education module for education specifics  Outcome: Progressing   Problem: RH BOWEL ELIMINATION Goal: RH STG MANAGE BOWEL WITH ASSISTANCE Description: STG Manage Bowel with supervision Assistance. Outcome: Progressing   Problem: RH BLADDER ELIMINATION Goal: RH STG MANAGE BLADDER WITH ASSISTANCE Description: STG Manage Bladder With  supervision Assistance Outcome: Progressing   Problem: RH SKIN INTEGRITY Goal: RH STG SKIN FREE OF INFECTION/BREAKDOWN Description: Manage skin free of infection/breakdown with supervision assistance Outcome: Progressing   Problem: RH SAFETY Goal: RH STG ADHERE TO SAFETY PRECAUTIONS W/ASSISTANCE/DEVICE Description: STG Adhere to Safety Precautions With supervision  Assistance/Device. Outcome: Progressing   Problem: RH PAIN MANAGEMENT Goal: RH STG PAIN MANAGED AT OR BELOW PT'S PAIN GOAL Description: < 4 w/prns Outcome: Progressing   Problem: RH KNOWLEDGE DEFICIT Goal: RH STG INCREASE KNOWLEDGE OF HYPERTENSION Description: Manage increase knowledge of hypertension with supervision assistance from wife using educational materials provided Outcome: Progressing Goal: RH STG INCREASE KNOWLEDGE OF DYSPHAGIA/FLUID INTAKE Outcome: Progressing Goal: RH STG INCREASE KNOWLEGDE OF HYPERLIPIDEMIA Description: Manage increase knowledge of hyperlipidemia  with supervision assistance from wife using educational materials provided Outcome: Progressing Goal: RH STG INCREASE KNOWLEDGE OF STROKE PROPHYLAXIS Description: Manage increase knowledge of stroke prophylaxis with supervision assistance from wife using educational materials provided Outcome: Progressing   Problem: RH Vision Goal: RH LTG Vision (Specify) Outcome: Progressing

## 2024-01-06 NOTE — Progress Notes (Signed)
 Physical Therapy Session Note  Patient Details  Name: Troy Bishop MRN: [id number] Date of Birth: [birth year]  Today's Date: 01/06/2024 PT Individual Time: 1010-1050 PT Individual Time Calculation (min): 40 min   Short Term Goals: Week 1:  PT Short Term Goal 1 (Week 1): Pt will transfer sup to sit w/ mod A consistently. PT Short Term Goal 2 (Week 1): Pt will maintain seated balance x 5' w/ CGA PT Short Term Goal 3 (Week 1): Pt will transfer sit to stand w/ mod A + 1 PT Short Term Goal 4 (Week 1): PT to assess gait.  Skilled Therapeutic Interventions/Progress Updates: Patient sitting in TIS with wife present on entrance to room. Patient alert and agreeable to PT session.   Session focused on bed mobility, AFO application, and transfers. Pt performed supine<sit on EOB with max cuing required to initiate roll to side, and to control movement vs making quick impulsive movements. Pt minA to get to EOB if adhering to cues for sequence of hand placement and advancing B LE off of bed (closer to modA to advance L LE). Pt performed x 2 with same cues and increased time required to safely follow commands. Pt performed stand pivot transfer with PTA providing L knee block with knee to prevent buckle, and cues for pt to pivot LE accordingly (maxA). Pt transported to day room gym in TIS and participated in squat pivot transfers with overall minA with VC for hand/feet placement and head/hip relationship. Pt participated in static standing balance with mirror feedback and maxA + 2 with tech on R providing HHA, and PTA on L maintaining knee in extension with tactile cues for pt to self initiate (lack of this session. PTA provided GRAFO on L LE (pt noted to have increased plantarflexor tone that makes it difficult for L foot to fit in shoe with GRAFO donned - advised pt wife to potentially get a wide size shoe to assist).  Patient sitting in TIS at end of session with brakes locked, wife present, belt alarm set, and all  needs within reach.      Therapy Documentation Precautions:  Precautions Precautions: Fall Precaution/Restrictions Comments: L hemi, neglect Restrictions Weight Bearing Restrictions Per Provider Order: No  Therapy/Group: Individual Therapy  Ricard Faulkner PTA 01/06/2024, 12:56 PM

## 2024-01-06 NOTE — Plan of Care (Signed)
  Problem: Consults Goal: RH STROKE PATIENT EDUCATION Description: See Patient Education module for education specifics  01/06/2024 1851 by Annalee Barren, RN Outcome: Progressing 01/06/2024 1702 by Annalee Barren, RN Outcome: Progressing   Problem: RH BOWEL ELIMINATION Goal: RH STG MANAGE BOWEL WITH ASSISTANCE Description: STG Manage Bowel with supervision Assistance. 01/06/2024 1851 by Annalee Barren, RN Outcome: Progressing 01/06/2024 1702 by Annalee Barren, RN Outcome: Progressing   Problem: RH BLADDER ELIMINATION Goal: RH STG MANAGE BLADDER WITH ASSISTANCE Description: STG Manage Bladder With  supervision Assistance 01/06/2024 1851 by Annalee Barren, RN Outcome: Progressing 01/06/2024 1702 by Annalee Barren, RN Outcome: Progressing   Problem: RH SKIN INTEGRITY Goal: RH STG SKIN FREE OF INFECTION/BREAKDOWN Description: Manage skin free of infection/breakdown with supervision assistance 01/06/2024 1851 by Annalee Barren, RN Outcome: Progressing 01/06/2024 1702 by Annalee Barren, RN Outcome: Progressing   Problem: RH SAFETY Goal: RH STG ADHERE TO SAFETY PRECAUTIONS W/ASSISTANCE/DEVICE Description: STG Adhere to Safety Precautions With supervision  Assistance/Device. 01/06/2024 1851 by Annalee Barren, RN Outcome: Progressing 01/06/2024 1702 by Annalee Barren, RN Outcome: Progressing   Problem: RH PAIN MANAGEMENT Goal: RH STG PAIN MANAGED AT OR BELOW PT'S PAIN GOAL Description: < 4 w/prns 01/06/2024 1851 by Annalee Barren, RN Outcome: Progressing 01/06/2024 1702 by Annalee Barren, RN Outcome: Progressing   Problem: RH KNOWLEDGE DEFICIT Goal: RH STG INCREASE KNOWLEDGE OF HYPERTENSION Description: Manage increase knowledge of hypertension with supervision assistance from wife using educational materials provided 01/06/2024 1851 by Annalee Barren, RN Outcome: Progressing 01/06/2024 1702 by Annalee Barren, RN Outcome: Progressing Goal: RH STG INCREASE KNOWLEDGE OF DYSPHAGIA/FLUID INTAKE 01/06/2024 1851 by Annalee Barren, RN Outcome: Progressing 01/06/2024 1702 by Annalee Barren, RN Outcome: Progressing Goal: RH STG INCREASE KNOWLEGDE OF HYPERLIPIDEMIA Description: Manage increase knowledge of hyperlipidemia  with supervision assistance from wife using educational materials provided 01/06/2024 1851 by Annalee Barren, RN Outcome: Progressing 01/06/2024 1702 by Annalee Barren, RN Outcome: Progressing Goal: RH STG INCREASE KNOWLEDGE OF STROKE PROPHYLAXIS Description: Manage increase knowledge of stroke prophylaxis with supervision assistance from wife using educational materials provided 01/06/2024 1851 by Annalee Barren, RN Outcome: Progressing 01/06/2024 1702 by Annalee Barren, RN Outcome: Progressing   Problem: RH Vision Goal: RH LTG Vision (Specify) 01/06/2024 1851 by Annalee Barren, RN Outcome: Progressing 01/06/2024 1702 by Annalee Barren, RN Outcome: Progressing

## 2024-01-06 NOTE — Progress Notes (Signed)
 Pt refused to wear both resting hand splint and AFO boot. Stated "they itchy".

## 2024-01-07 DIAGNOSIS — D62 Acute posthemorrhagic anemia: Secondary | ICD-10-CM | POA: Diagnosis not present

## 2024-01-07 DIAGNOSIS — I619 Nontraumatic intracerebral hemorrhage, unspecified: Secondary | ICD-10-CM | POA: Diagnosis not present

## 2024-01-07 DIAGNOSIS — I10 Essential (primary) hypertension: Secondary | ICD-10-CM | POA: Diagnosis not present

## 2024-01-07 DIAGNOSIS — G4700 Insomnia, unspecified: Secondary | ICD-10-CM

## 2024-01-07 DIAGNOSIS — E785 Hyperlipidemia, unspecified: Secondary | ICD-10-CM | POA: Diagnosis not present

## 2024-01-07 MED ORDER — DIPHENHYDRAMINE HCL 25 MG PO CAPS
25.0000 mg | ORAL_CAPSULE | ORAL | Status: DC | PRN
  Administered 2024-01-07 – 2024-01-29 (×22): 25 mg via ORAL
  Filled 2024-01-07 (×25): qty 1

## 2024-01-07 NOTE — Plan of Care (Signed)
  Problem: Consults Goal: RH STROKE PATIENT EDUCATION Description: See Patient Education module for education specifics  Outcome: Progressing   Problem: RH BOWEL ELIMINATION Goal: RH STG MANAGE BOWEL WITH ASSISTANCE Description: STG Manage Bowel with supervision Assistance. Outcome: Progressing   Problem: RH BLADDER ELIMINATION Goal: RH STG MANAGE BLADDER WITH ASSISTANCE Description: STG Manage Bladder With  supervision Assistance Outcome: Progressing   Problem: RH SKIN INTEGRITY Goal: RH STG SKIN FREE OF INFECTION/BREAKDOWN Description: Manage skin free of infection/breakdown with supervision assistance Outcome: Progressing   Problem: RH SAFETY Goal: RH STG ADHERE TO SAFETY PRECAUTIONS W/ASSISTANCE/DEVICE Description: STG Adhere to Safety Precautions With supervision  Assistance/Device. Outcome: Progressing   Problem: RH PAIN MANAGEMENT Goal: RH STG PAIN MANAGED AT OR BELOW PT'S PAIN GOAL Description: < 4 w/prns Outcome: Progressing   Problem: RH KNOWLEDGE DEFICIT Goal: RH STG INCREASE KNOWLEDGE OF HYPERTENSION Description: Manage increase knowledge of hypertension with supervision assistance from wife using educational materials provided Outcome: Progressing Goal: RH STG INCREASE KNOWLEDGE OF DYSPHAGIA/FLUID INTAKE Outcome: Progressing Goal: RH STG INCREASE KNOWLEGDE OF HYPERLIPIDEMIA Description: Manage increase knowledge of hyperlipidemia  with supervision assistance from wife using educational materials provided Outcome: Progressing Goal: RH STG INCREASE KNOWLEDGE OF STROKE PROPHYLAXIS Description: Manage increase knowledge of stroke prophylaxis with supervision assistance from wife using educational materials provided Outcome: Progressing   Problem: RH Vision Goal: RH LTG Vision (Specify) Outcome: Progressing

## 2024-01-07 NOTE — Progress Notes (Signed)
 PROGRESS NOTE   Subjective/Complaints:  Pt doing well this morning, slept well overnight though he woke up at 4am but he's not really concerned with that. Denies pain this morning, LBM yesterday, urinating fine. No other complaints or concerns.   ROS: as per HPI. Denies CP, SOB, abd pain, N/V/D/C, or any other complaints at this time.    Objective:   No results found. No results for input(s): "WBC", "HGB", "HCT", "PLT" in the last 72 hours.  No results for input(s): "NA", "K", "CL", "CO2", "GLUCOSE", "BUN", "CREATININE", "CALCIUM " in the last 72 hours.       Intake/Output Summary (Last 24 hours) at 01/07/2024 1037 Last data filed at 01/07/2024 0930 Gross per 24 hour  Intake 818 ml  Output --  Net 818 ml        Physical Exam: Vital Signs Blood pressure 111/79, pulse 81, temperature 98.7 F (37.1 C), temperature source Oral, resp. rate 18, height 5\' 7"  (1.702 m), weight 68.4 kg, SpO2 100%.  Constitutional: No distress . Vital signs reviewed. Resting comfortably in bedside chair.  HEENT: NCAT, EOMI, oral membranes moist, facial droop L side Neck: supple Cardiovascular: RRR without murmur. No JVD    Respiratory/Chest: CTA Bilaterally without wheezes or rales. Normal effort    GI/Abdomen: BS +, non-tender, non-distended, soft Ext: no clubbing, cyanosis, or edema Psych: pleasant and cooperative   Skin: intact over exposed surfaces Neuro: L facial droop, speech dysarthric but intelligible, LUE/LLE weakness. Much more awake and alert/bright this morning  PRIOR EXAMS: Neuro: attention a little better, Alert and oriented x3  Left facial droop. Speech remains dysarthric LLE 1+/5 HE and HF, tr-1 KE, and distally is 0/5, LUE 0/5 -->perhaps trace in fingers? Right sided strength is 5/5. Decreased sensation throughout left side although can sense gross touch and pain. Increased DTR's LLE with ongoing sustained clonus at ankle. Heel  cord tight but can be ranged to neutral. NO PRAFO Musc: no back pain or hip pain today   Assessment/Plan: 1. Functional deficits which require 3+ hours per day of interdisciplinary therapy in a comprehensive inpatient rehab setting. Physiatrist is providing close team supervision and 24 hour management of active medical problems listed below. Physiatrist and rehab team continue to assess barriers to discharge/monitor patient progress toward functional and medical goals  Care Tool:  Bathing    Body parts bathed by patient: Chest, Abdomen, Right upper leg, Left upper leg, Face, Front perineal area, Left arm   Body parts bathed by helper: Right arm, Buttocks, Left lower leg, Right lower leg     Bathing assist Assist Level: Maximal Assistance - Patient 24 - 49%     Upper Body Dressing/Undressing Upper body dressing   What is the patient wearing?: Pull over shirt    Upper body assist Assist Level: Moderate Assistance - Patient 50 - 74%    Lower Body Dressing/Undressing Lower body dressing      What is the patient wearing?: Underwear/pull up, Pants     Lower body assist Assist for lower body dressing: Maximal Assistance - Patient 25 - 49% (bed level)     Toileting Toileting    Toileting assist Assist for toileting: 2  Helpers     Transfers Chair/bed transfer  Transfers assist  Chair/bed transfer activity did not occur: Safety/medical concerns  Chair/bed transfer assist level: 2 Helpers     Locomotion Ambulation   Ambulation assist   Ambulation activity did not occur: Safety/medical concerns  Assist level: Maximal Assistance - Patient 25 - 49% Assistive device:  (RHR) Max distance: 35   Walk 10 feet activity   Assist  Walk 10 feet activity did not occur: Safety/medical concerns  Assist level: Maximal Assistance - Patient 25 - 49% Assistive device:  (RHR)   Walk 50 feet activity   Assist Walk 50 feet with 2 turns activity did not occur:  Safety/medical concerns         Walk 150 feet activity   Assist Walk 150 feet activity did not occur: Safety/medical concerns         Walk 10 feet on uneven surface  activity   Assist Walk 10 feet on uneven surfaces activity did not occur: Safety/medical concerns         Wheelchair     Assist Is the patient using a wheelchair?: Yes Type of Wheelchair: Manual    Wheelchair assist level: Dependent - Patient 0% Max wheelchair distance: 150    Wheelchair 50 feet with 2 turns activity    Assist        Assist Level: Dependent - Patient 0%   Wheelchair 150 feet activity     Assist      Assist Level: Dependent - Patient 0%   Blood pressure 111/79, pulse 81, temperature 98.7 F (37.1 C), temperature source Oral, resp. rate 18, height 5\' 7"  (1.702 m), weight 68.4 kg, SpO2 100%.  Medical Problem List and Plan: 1. Functional deficits secondary to right basal ganglia large ICH             -patient may shower             -ELOS/Goals: 12-16 days S -pt with emerging tone in LLE-- awaiting PRAFO to help control foot/ankle.  Don't think we need antispasmodics yet. 5/30 following up with [facility] re: delivery of brace -5/30 reviewed patterns of recover after stroke, discussed maintaining ROM, joint positioning, etc to optimize potential for recovery. Also discussed his drooling. I would prefer not to try to treat that as the treatments might cause greater side effects than the drooling itself. Encouraged active swallowing and monitoring, keeping a towel handy. Sx should gradually improve. 2.  Antithrombotics: -DVT/anticoagulation:  Pharmaceutical: SCDs, heparin  5000U q8h             -antiplatelet therapy: N/A  3. Pain Management: Tylenol  prn. Continue gabapentin  200mg  BID -  Oxycodone  2.5mg  q6h PRN     5/28 having intermittent muscular pain it appears, left hip pain better   -continue above as well as heat/ice/rom/oob  5/30 pain improved. 4.  Mood/Behavior/Sleep: LCSW to follow for evaluation and support.              -antipsychotic agents: N/A  -Seroquel    Ativan  0.5mg  q6h PRN, melatonin 5mg  PRN  -5/29-   seroquel  reduced to at bedtime only -5/30 if he does well this weekend, can change seroquel  to at bedtime prn.  He already is much brighter with reduction to HS    -he is also using melatonin prn which we could schedule -01/06/24 will change seroquel  to at bedtime PRN and schedule melatonin at bedtime; pt agreeable -01/07/24 pt slept well, woke up early but not bothered by that, much  more awake this morning-- leave meds as is 5. Neuropsych/cognition: This patient is not capable of making decisions on his own behalf.  6. Skin/Wound Care: Routine pressure relief measures.   7. Fluids/Electrolytes/Nutrition: Monitor I/O. Monitor routine labs. Continue vitamins/supplements.  -Dys 3 diet with thins (SLP to eval)   -Intake improved.  Does better with food from home as well. 8. Metabolic encephalopathy/ETOH withdrawal: Has resolved, wean seroquel  and ativan  as tolerated. Continue lactulose  30g BID   5/27 ammonia level 23  9. Abnormal LFTs:  AST/ALT trending down.    -recheck Monday 6/2   10. ABLA?: Hgb 12's, monitor Hgb weekly   11. HTN: no meds PTA, started on amlodipine  10mg  daily, coreg  25mg  BID, losartan  100mg  daily, hydralazine  PRN, labetalol  PRN. SBP goal <160 -01/05/24 BPs generally controlled.  monitor on current regimen with increased mobility -5/31-6/1 BPs great, d/c'd hydralazine /labetalol  PRNs  Vitals:   01/04/24 0600 01/04/24 0942 01/04/24 1414 01/04/24 1815  BP: 117/80 127/83 110/68 106/73   01/04/24 2122 01/05/24 0508 01/05/24 1251 01/05/24 2011  BP: 102/67 107/71 104/68 108/78   01/06/24 0529 01/06/24 1257 01/06/24 1900 01/07/24 0506  BP: 104/65 106/77 107/76 111/79      12. Tobacco abuse: continue nicoderm patches.   13. GI ppx: continue Protonix  40mg  daily  14. Bowel management: continue Miralax  daily  -   LBM 5/29 , is now continent  -5/30 stools liquidy---back off miralax  -01/06/24 LBM this morning, not sure what type since not documented; monitor for now -01/07/24 LBM yesterday, monitor  15. HLD: LDL 124, goal LDL <70, per d/c summary, start atorvastatin  40mg  once LFTs downtrend and repeat in 1 week -5/27 LFT's better yesterday -resumed atorvastatin    -5/30 recheck cmet Monday    LOS: 8 days A FACE TO FACE EVALUATION WAS PERFORMED  [address] Jadzia Ibsen 01/07/2024, 10:37 AM

## 2024-01-08 DIAGNOSIS — I610 Nontraumatic intracerebral hemorrhage in hemisphere, subcortical: Secondary | ICD-10-CM | POA: Diagnosis not present

## 2024-01-08 DIAGNOSIS — F1010 Alcohol abuse, uncomplicated: Secondary | ICD-10-CM | POA: Diagnosis not present

## 2024-01-08 DIAGNOSIS — G8194 Hemiplegia, unspecified affecting left nondominant side: Secondary | ICD-10-CM | POA: Diagnosis not present

## 2024-01-08 LAB — CBC WITH DIFFERENTIAL/PLATELET
Abs Immature Granulocytes: 0.02 10*3/uL (ref 0.00–0.07)
Basophils Absolute: 0 10*3/uL (ref 0.0–0.1)
Basophils Relative: 1 %
Eosinophils Absolute: 0.3 10*3/uL (ref 0.0–0.5)
Eosinophils Relative: 5 %
HCT: 37 % — ABNORMAL LOW (ref 39.0–52.0)
Hemoglobin: 12.2 g/dL — ABNORMAL LOW (ref 13.0–17.0)
Immature Granulocytes: 0 %
Lymphocytes Relative: 20 %
Lymphs Abs: 1.1 10*3/uL (ref 0.7–4.0)
MCH: 31.4 pg (ref 26.0–34.0)
MCHC: 33 g/dL (ref 30.0–36.0)
MCV: 95.1 fL (ref 80.0–100.0)
Monocytes Absolute: 0.5 10*3/uL (ref 0.1–1.0)
Monocytes Relative: 8 %
Neutro Abs: 3.8 10*3/uL (ref 1.7–7.7)
Neutrophils Relative %: 66 %
Platelets: 112 10*3/uL — ABNORMAL LOW (ref 150–400)
RBC: 3.89 MIL/uL — ABNORMAL LOW (ref 4.22–5.81)
RDW: 13.2 % (ref 11.5–15.5)
WBC: 5.8 10*3/uL (ref 4.0–10.5)
nRBC: 0 % (ref 0.0–0.2)

## 2024-01-08 LAB — COMPREHENSIVE METABOLIC PANEL WITH GFR
ALT: 189 U/L — ABNORMAL HIGH (ref 0–44)
AST: 84 U/L — ABNORMAL HIGH (ref 15–41)
Albumin: 3.3 g/dL — ABNORMAL LOW (ref 3.5–5.0)
Alkaline Phosphatase: 85 U/L (ref 38–126)
Anion gap: 9 (ref 5–15)
BUN: 8 mg/dL (ref 6–20)
CO2: 22 mmol/L (ref 22–32)
Calcium: 8.9 mg/dL (ref 8.9–10.3)
Chloride: 107 mmol/L (ref 98–111)
Creatinine, Ser: 0.59 mg/dL — ABNORMAL LOW (ref 0.61–1.24)
GFR, Estimated: >60 mL/min (ref >60)
Glucose, Bld: 97 mg/dL (ref 70–99)
Potassium: 3.7 mmol/L (ref 3.5–5.1)
Sodium: 138 mmol/L (ref 135–145)
Total Bilirubin: 0.6 mg/dL (ref 0.0–1.2)
Total Protein: 7.6 g/dL (ref 6.5–8.1)

## 2024-01-08 NOTE — Progress Notes (Signed)
 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 Denies CP, SOB, abd pain, N/V/D/C, or any other complaints at this time.    Objective:   No results found. No results for input(s): "WBC", "HGB", "HCT", "PLT" in the last 72 hours.  Recent Labs    01/08/24 0519  NA 138  K 3.7  CL 107  CO2 22  GLUCOSE 97  BUN 8  CREATININE 0.59*  CALCIUM  8.9         Intake/Output Summary (Last 24 hours) at 01/08/2024 0902 Last data filed at 01/07/2024 1502 Gross per 24 hour  Intake 397 ml  Output --  Net 397 ml        Physical Exam: Vital Signs Blood pressure 105/69, pulse 62, temperature 97.9 F (36.6 C), resp. rate 17, height 5\' 7"  (1.702 m), weight 68.8 kg, SpO2 100%.    General: No acute distress Mood and affect are appropriate Heart: Regular rate and rhythm no rubs murmurs or extra sounds Lungs: Clear to auscultation, breathing unlabored, no rales or wheezes Abdomen: Positive bowel sounds, soft nontender to palpation, nondistended Extremities: No clubbing, cyanosis, or edema Skin: No evidence of breakdown, no evidence of rash   PRIOR EXAMS: Neuro: attention a little better, Alert and oriented x3  Left facial droop. Speech remains dysarthric LLE 2- left hip add, 3- Left hip flexion trace quad  and distally is 0/5, LUE 0/5 -->perhaps trace in fingers? Right sided strength is 5/5. Decreased sensation throughout left side although can sense gross touch and pain. Increased DTR's LLE with ongoing sustained clonus at ankle. Heel cord tight but can be ranged to neutral. NO PRAFO Musc: no back pain or hip pain today   Assessment/Plan: 1. Functional deficits which require 3+ hours per day of interdisciplinary therapy in a comprehensive inpatient rehab setting. Physiatrist is providing close team  supervision and 24 hour management of active medical problems listed below. Physiatrist and rehab team continue to assess barriers to discharge/monitor patient progress toward functional and medical goals  Care Tool:  Bathing    Body parts bathed by patient: Chest, Abdomen, Right upper leg, Left upper leg, Face, Front perineal area, Left arm   Body parts bathed by helper: Right arm, Buttocks, Left lower leg, Right lower leg     Bathing assist Assist Level: Maximal Assistance - Patient 24 - 49%     Upper Body Dressing/Undressing Upper body dressing   What is the patient wearing?: Pull over shirt    Upper body assist Assist Level: Moderate Assistance - Patient 50 - 74%    Lower Body Dressing/Undressing Lower body dressing      What is the patient wearing?: Underwear/pull up, Pants     Lower body assist Assist for lower body dressing: Maximal Assistance - Patient 25 - 49% (bed level)     Toileting Toileting    Toileting assist Assist for toileting: 2 Helpers     Transfers Chair/bed transfer  Transfers assist  Chair/bed transfer activity did not occur: Safety/medical concerns  Chair/bed transfer assist level: 2 Helpers  Locomotion Ambulation   Ambulation assist   Ambulation activity did not occur: Safety/medical concerns  Assist level: Maximal Assistance - Patient 25 - 49% Assistive device:  (RHR) Max distance: 35   Walk 10 feet activity   Assist  Walk 10 feet activity did not occur: Safety/medical concerns  Assist level: Maximal Assistance - Patient 25 - 49% Assistive device:  (RHR)   Walk 50 feet activity   Assist Walk 50 feet with 2 turns activity did not occur: Safety/medical concerns         Walk 150 feet activity   Assist Walk 150 feet activity did not occur: Safety/medical concerns         Walk 10 feet on uneven surface  activity   Assist Walk 10 feet on uneven surfaces activity did not occur: Safety/medical concerns          Wheelchair     Assist Is the patient using a wheelchair?: Yes Type of Wheelchair: Manual    Wheelchair assist level: Dependent - Patient 0% Max wheelchair distance: 150    Wheelchair 50 feet with 2 turns activity    Assist        Assist Level: Dependent - Patient 0%   Wheelchair 150 feet activity     Assist      Assist Level: Dependent - Patient 0%   Blood pressure 105/69, pulse 62, temperature 97.9 F (36.6 C), resp. rate 17, height 5\' 7"  (1.702 m), weight 68.8 kg, SpO2 100%.  Medical Problem List and Plan: 1. Functional deficits secondary to right basal ganglia large ICH 12/16/23             -patient may shower             -ELOS/Goals: 6/17 minA goals  -pt with emerging tone in LLE-- awaiting PRAFO to help control foot/ankle.  Don't think we need antispasmodics yet. 5/30 following up with [facility] re: delivery of brace -5/30 reviewed patterns of recover after stroke, discussed maintaining ROM, joint positioning, etc to optimize potential for recovery. Also discussed his drooling. I would prefer not to try to treat that as the treatments might cause greater side effects than the drooling itself. Encouraged active swallowing and monitoring, keeping a towel handy. Sx should gradually improve. 2.  Antithrombotics: -DVT/anticoagulation:  Pharmaceutical: SCDs, heparin  5000U q8h             -antiplatelet therapy: N/A  3. Pain Management: Tylenol  prn. Continue gabapentin  200mg  BID -  Oxycodone  2.5mg  q6h PRN     5/28 having intermittent muscular pain it appears, left hip pain better   -continue above as well as heat/ice/rom/oob  5/30 pain improved. 4. Mood/Behavior/Sleep: LCSW to follow for evaluation and support.              -antipsychotic agents: N/A  -Seroquel    Ativan  0.5mg  q6h PRN, melatonin 5mg  PRN  -5/29-   seroquel  reduced to at bedtime only -5/30 if he does well this weekend, can change seroquel  to at bedtime prn.  He already is much brighter  with reduction to HS    -he is also using melatonin prn which we could schedule -01/06/24 will change seroquel  to at bedtime PRN and schedule melatonin at bedtime; pt agreeable -01/07/24 pt slept well, woke up early but not bothered by that, much more awake this morning-- leave meds as is 5. Neuropsych/cognition: This patient is not capable of making decisions on his own behalf.  6. Skin/Wound Care: Routine pressure relief measures.  7. Fluids/Electrolytes/Nutrition: Monitor I/O. Monitor routine labs. Continue vitamins/supplements.  -Dys 3 diet with thins (SLP to eval)   -Intake improved.  Does better with food from home as well. 8. Metabolic encephalopathy/ETOH withdrawal: Has resolved, wean seroquel  and ativan  as tolerated. Continue lactulose  30g BID   5/27 ammonia level 23  9. Abnormal LFTs:  AST/ALT trending down.    -recheck Monday 6/2   10. ABLA?: Hgb 12's, monitor Hgb weekly   11. HTN: no meds PTA, started on amlodipine  10mg  daily, coreg  25mg  BID, losartan  100mg  daily, hydralazine  PRN, labetalol  PRN. SBP goal <160 -01/05/24 BPs generally controlled.  monitor on current regimen with increased mobility -5/31-6/1 BPs great, d/c'd hydralazine /labetalol  PRNs  Vitals:   01/04/24 1815 01/04/24 2122 01/05/24 0508 01/05/24 1251  BP: 106/73 102/67 107/71 104/68   01/05/24 2011 01/06/24 0529 01/06/24 1257 01/06/24 1900  BP: 108/78 104/65 106/77 107/76   01/07/24 0506 01/07/24 1331 01/07/24 2018 01/08/24 0650  BP: 111/79 115/77 114/77 105/69      12. Tobacco abuse: continue nicoderm patches.   13. GI ppx: continue Protonix  40mg  daily  14. Bowel management: continue Miralax  daily  -  LBM 5/29 , is now continent  -5/30 stools liquidy---back off miralax  -01/06/24 LBM this morning, not sure what type since not documented; monitor for now -01/07/24 LBM continent  15. HLD: LDL 124, goal LDL <70, per d/c summary, start atorvastatin  40mg  once LFTs downtrend and repeat in 1 week -5/27 LFT's  better yesterday -resumed atorvastatin    -LFTs cont to improved 6/2  LOS: 9 days A FACE TO FACE EVALUATION WAS PERFORMED  Genetta Kenning 01/08/2024, 9:02 AM

## 2024-01-08 NOTE — Progress Notes (Signed)
 Physical Therapy Session Note  Patient Details  Name: Troy Bishop MRN: [id number] Date of Birth: [birth year]  Today's Date: 01/08/2024 PT Individual Time: 0930-1000 PT Individual Time Calculation (min): 30 min   Short Term Goals: Week 1:  PT Short Term Goal 1 (Week 1): Pt will transfer sup to sit w/ mod A consistently. PT Short Term Goal 2 (Week 1): Pt will maintain seated balance x 5' w/ CGA PT Short Term Goal 3 (Week 1): Pt will transfer sit to stand w/ mod A + 1 PT Short Term Goal 4 (Week 1): PT to assess gait.  Skilled Therapeutic Interventions/Progress Updates: Pt presents semi-reclined in bed, but scooched down into crease of bed, agreeable to therapy.  Spouse had brought in larger size shoe and donned shoes w/ L AFO.  Pt transferred sup to sit w/ mod A, mostly 2/2 very close to EOB at initiation.  Pt able to sit EOB w/ CGA.  Pt performed sit to stand w/ mod A and blocking L knee.  Pt performed bed > chair transfer w/ mod A and cues, + 1 standby/steadying 2/2 impulsive behavior and direction-following difficulties.  Pt wheeled to dayroom and performed sit to stand w/ chair back to R side.  Pt performed hip flexion RLE w/ blocking of L knee.  Pt very impulsive.  Pt returned to room and remained sitting in TIS reclined back w/ chair alarm on and spouse present.     Therapy Documentation Precautions:  Precautions Precautions: Fall Precaution/Restrictions Comments: L hemi, neglect Restrictions Weight Bearing Restrictions Per Provider Order: No General:   Vital Signs:   Pain:0/10     Therapy/Group: Individual Therapy  Myley Bahner P Kiyah Demartini 01/08/2024, 12:08 PM

## 2024-01-08 NOTE — Progress Notes (Signed)
 Speech Language Pathology Weekly Progress and Session Note  Patient Details  Name: Troy Bishop MRN: [id number] Date of Birth: May 27, 1991  Beginning of progress report period: Dec 31, 2023 End of progress report period: January 08, 2024  Today's Date: 01/08/2024 SLP Individual Time: 1350-1445 SLP Individual Time Calculation (min): 55 min  Short Term Goals: Week 1: SLP Short Term Goal 1 (Week 1): Pt will use safe swallow strategies (small bites/sips, slow rate, lingual sweep) when consuming Dys 3/thin diet to reduce the risk of aspiration/choking. SLP Short Term Goal 1 - Progress (Week 1): Progressing toward goal SLP Short Term Goal 2 (Week 1): Pt will use compensatory memory strategies to increase recall of functional information up to 3 minutes when provided verbal cueing and external memory aids. SLP Short Term Goal 2 - Progress (Week 1): Met SLP Short Term Goal 3 (Week 1): Pt will name at least 1 cognitive/physical change and how it will functionally impact him to demonstrate increased awareness of deficits and increase reasoning/safety. SLP Short Term Goal 3 - Progress (Week 1): Met SLP Short Term Goal 4 (Week 1): Pt will complete verbal safety/reasoning tasks with 60% acc when provided verbal cueing. SLP Short Term Goal 4 - Progress (Week 1): Met SLP Short Term Goal 5 (Week 1): Pt will increase speech intelligibility at the sentence level to 80% using compensatory strategies when provided fading verbal cueing. SLP Short Term Goal 5 - Progress (Week 1): Progressing toward goal    New Short Term Goals: Week 2: SLP Short Term Goal 1 (Week 2): Patient will consume current diet with use of swallowing compensatory strategies given min multimodal A SLP Short Term Goal 2 (Week 2): Patient will utilize speech intelligibility strategies to reach 85% intelligibility at the sentence level given mod verbal A SLP Short Term Goal 3 (Week 2): Patient will demonstrate problem solving abilities during basic  daily situations given mod multimodal A SLP Short Term Goal 4 (Week 2): Patient will demonstrate awareness of current deficits by verbalizing cognitive and physical changes given mod multimodal A SLP Short Term Goal 5 (Week 2): Patient will sustain attention to the left during functional tasks for 5 minutes given mod multimodal A  Weekly Progress Updates: Pt has made good gains and has met 3 of 5 STGs this reporting period due to improved awareness, memory and understanding of safety. Currently, patient continues to require min-mod A for use of swallowing compensatory strategies during consumption of D3/thin diet. Patient with improvements in speech intelligibility this reporting period, reaching up to 75-80% intelligibility at the sentence level given mod verbal A. Patient continues to demonstrate deficits in cognitive skills, however with increasing awareness and ability to attend to the L. Patient continues to require mod-maxA during abstract problem solving activities.  Pt/family eduction ongoing. Pt would benefit from continued ST intervention to maximize dysphagia, dysarthria and cognition in order to maximize functional independence at d/c.    Intensity: Minumum of 1-2 x/day, 30 to 90 minutes Frequency: 3 to 5 out of 7 days Duration/Length of Stay: 6/17 Treatment/Interventions: Therapeutic Activities;Therapeutic Exercise;Internal/external aids;Dysphagia/aspiration precaution training;Patient/family education;Cognitive remediation/compensation;Environmental controls;Cueing hierarchy;Functional tasks   Daily Session  Skilled Therapeutic Interventions:  Skilled therapy session focused on cognitive, dysphagia and dysarthria goals. SLP facilitated session by targeting orientation task. Patient independently oriented to self, location and time, however required modA to recall "stroke" rather than "fall" for reason for admission. Patient recalled physical changes with minA, however continues to  demonstrate reduced awareness of cognitive changes. SLP targeted  problem solving through identification of call bell and recalling its use. Patient independently recalled how to contact nurses and for appropriate reasons. SLP continued to challenge patients cognitive abilities through problem/solution cards. Patient identified safety hazards and their solutions given min-modA. SLP targeted dysphagia goals through observation of D3/thin liquid trials. Patient required minA to utilize safe swallowing strategies including lingual sweep, liquid wash and placing straw on the R to reduce anterior spillage. No s/sx of aspiration. Continue current diet and full supervision. At the end of the session, SLP targeted dysarthria goals through review of SLOP speech strategies (slow, loud, over articulate, pause). Patient approximately 75% intelligible at the sentence level given modA. Patient left in [facility] with alarm set and call bell in reach. Continue POC.      Pain None reported   Therapy/Group: Individual Therapy  Noble Bodie M.A., CCC-SLP 01/08/2024, 3:48 PM

## 2024-01-08 NOTE — Progress Notes (Signed)
 Occupational Therapy Weekly Progress Note  Patient Details  Name: Troy Bishop MRN: [id number] Date of Birth: 11-30-1990  Beginning of progress report period: Dec 31, 2023 End of progress report period: January 08, 2024  Today's Date: 01/08/2024 OT Individual Time: 1030-1130 OT Individual Time Calculation (min): 60 min    Patient has met 4 of 4 short term goals.  Pt has made strong progress with his sitting balance and has developed some active movement in L arm and leg. Today he was able to lift his L arm in abduction to 45 degrees! This is strong progress from total flaccidity on admission.  He continues to be impulsive but has "calmed" down a bit and is following instructions to move slowly.    Patient continues to demonstrate the following deficits: unbalanced muscle activation, decreased attention to left and decreased motor planning, decreased awareness, decreased problem solving, and decreased safety awareness, and decreased standing balance, hemiplegia, and decreased balance strategies and therefore will continue to benefit from skilled OT intervention to enhance overall performance with BADL.  Patient progressing toward long term goals..  Continue plan of care.  OT Short Term Goals Week 1:  OT Short Term Goal 1 (Week 1): Pt will maintain sitting balance on toilet with min A with max cues OT Short Term Goal 1 - Progress (Week 1): Met OT Short Term Goal 2 (Week 1): Pt will don shirt with mod A with cues OT Short Term Goal 2 - Progress (Week 1): Met OT Short Term Goal 3 (Week 1): Pt will attend to left visual field to obtain items for ADL with min questioning cuing OT Short Term Goal 3 - Progress (Week 1): Met OT Short Term Goal 4 (Week 1): Pt will transfer with mod A +1 consistently OT Short Term Goal 4 - Progress (Week 1): Met Week 2:  OT Short Term Goal 1 (Week 2): Pt will transfer to his L side to toilet with min A. OT Short Term Goal 2 (Week 2): Pt will be able to hold static stand  during toileting with min A. OT Short Term Goal 3 (Week 2): Pt will don tshirt with min A. OT Short Term Goal 4 (Week 2): Pt will use LUE as a stabilizing A with min A,  Skilled Therapeutic Interventions/Progress Updates:    Pt received in TIS w.c and requested to try to toilet.  +2 A from rehab tech as pt completed stand pivot to L with max A to move L leg.  Pt is able to sq pivot to L with mod A but not ready for stand pivot.  To the R he can sq pivt with only min A.  Pt needs max A with toileting. Voided urine only.  Transferred back to wc and taken to gym for L side NMR.  Standing balance at hi low table with mod A to support L knee to prevent buckling.  Pt stood for 10 min while working on active  L hand gilding on table with a/arom. Pt demonstrating progressing proximal active movement.  In seated, towel glides for sh and elbow.  Cued pt to move hand by sliding it on towel to knock an object off the table. Pt elicited active elbow flexion.  Placed estim on volar forearm to simulate finger flexion.  Pt had a good response at intensity 24 using Zynex estim for 10 sec on and off.  On on phase, had pt focus on grasping a cone and used functional movement pattern of "pouring  a glass of water" for forearm and wrist with guided A from therapist and bringing cone towards mouth to simulate drinking.  Guided A for elbow flexion with cues to then "push hand down to table" with active triceps. Pt able to elicit action well.   Returned to room and started to don alarm belt when pt requested to toilet again. Session time over so call nurse tech.   Therapy Documentation Precautions:  fall   Vital Signs: Therapy Vitals Pulse Rate: 73 Resp: 16 BP: 112/66 Patient Position (if appropriate): Sitting Oxygen Therapy SpO2: 100 % O2 Device: Room Air Pain:  No c/o pain  ADL: ADL Eating: Moderate assistance Grooming: Minimal assistance Upper Body Bathing: Moderate assistance Where Assessed-Upper Body  Bathing: Sitting at sink Lower Body Bathing: Maximal assistance Where Assessed-Lower Body Bathing: Sitting at sink Upper Body Dressing: Moderate assistance Where Assessed-Upper Body Dressing: Sitting at sink Lower Body Dressing: Maximal assistance Where Assessed-Lower Body Dressing: Sitting at sink   Therapy/Group: Individual Therapy  Dragon Thrush 01/08/2024, 1:43 PM

## 2024-01-08 NOTE — Progress Notes (Signed)
 Occupational Therapy Session Note  Patient Details  Name: Troy Bishop MRN: [id number] Date of Birth: 01/16/1991  Today's Date: 01/08/2024 OT Individual Time: 4132-4401 OT Individual Time Calculation (min): 42 min    Short Term Goals: Week 1:  OT Short Term Goal 1 (Week 1): Pt will maintain sitting balance on toilet with min A with max cues OT Short Term Goal 1 - Progress (Week 1): Met OT Short Term Goal 2 (Week 1): Pt will don shirt with mod A with cues OT Short Term Goal 2 - Progress (Week 1): Met OT Short Term Goal 3 (Week 1): Pt will attend to left visual field to obtain items for ADL with min questioning cuing OT Short Term Goal 3 - Progress (Week 1): Met OT Short Term Goal 4 (Week 1): Pt will transfer with mod A +1 consistently OT Short Term Goal 4 - Progress (Week 1): Met  Skilled Therapeutic Interventions/Progress Updates:  Pt greeted seated in w/c, pt agreeable to OT intervention.      ADLs:  Grooming: washed pts hair in sink with shampoo chair with a focus on directing level of care and to facilitate pt rapport and buy in, pt and wife appreciative of this task.  UB dressing:pt donned OH shirt with MODA needing assist for correct orientation of shirt, cues needed for sequencing of task.  LB dressing: pt declined changing pants  NMR: donned pts LUE into saebo arm mobilizer to facilitate gravity eliminated range of motion. Pt with active shoulder movement in gravity eliminated plane with pt able to complete targeted reaching.    Handed pt off directly to SLP for next session.               Therapy Documentation Precautions:  Precautions Precautions: Fall Precaution/Restrictions Comments: L hemi, neglect Restrictions Weight Bearing Restrictions Per Provider Order: No  Pain: no pain     Therapy/Group: Individual Therapy  Mollie Anger [facility] 01/08/2024, 3:13 PM

## 2024-01-09 NOTE — Progress Notes (Signed)
 PROGRESS NOTE   Subjective/Complaints:  Good day in therapy yeswterday , still c/o of drooling, discussed meds to control this can cause sedation   ROS: as per HPI. Denies CP, SOB, abd pain, N/V/D/C, or any other complaints at this time.    Objective:   No results found. Recent Labs    01/08/24 0928  WBC 5.8  HGB 12.2*  HCT 37.0*  PLT 112*    Recent Labs    01/08/24 0519  NA 138  K 3.7  CL 107  CO2 22  GLUCOSE 97  BUN 8  CREATININE 0.59*  CALCIUM  8.9         Intake/Output Summary (Last 24 hours) at 01/09/2024 0811 Last data filed at 01/09/2024 0543 Gross per 24 hour  Intake 476 ml  Output 200 ml  Net 276 ml        Physical Exam: Vital Signs Blood pressure 116/72, pulse 78, temperature 98.4 F (36.9 C), temperature source Oral, resp. rate 17, height 5\' 7"  (1.702 m), weight 68.8 kg, SpO2 99%.    General: No acute distress Mood and affect are appropriate Heart: Regular rate and rhythm no rubs murmurs or extra sounds Lungs: Clear to auscultation, breathing unlabored, no rales or wheezes Abdomen: Positive bowel sounds, soft nontender to palpation, nondistended Extremities: No clubbing, cyanosis, or edema Skin: No evidence of breakdown, no evidence of rash   PRIOR EXAMS: Neuro: attention a little better, Alert and oriented x3  Left facial droop. Speech remains dysarthric LLE 3- left hip add, 3- Left hip flexion trace quad  and distally is 0/5, LUE 2-/5 -->forearm supination , finger flexion Right sided strength is 5/5. Decreased sensation throughout left side although can sense gross touch and pain. Increased DTR's LLE with ongoing sustained clonus at ankle. Heel cord tight but can be ranged to neutral. NO PRAFO Musc: no back pain or hip pain today   Assessment/Plan: 1. Functional deficits which require 3+ hours per day of interdisciplinary therapy in a comprehensive inpatient rehab  setting. Physiatrist is providing close team supervision and 24 hour management of active medical problems listed below. Physiatrist and rehab team continue to assess barriers to discharge/monitor patient progress toward functional and medical goals  Care Tool:  Bathing    Body parts bathed by patient: Left arm, Chest, Abdomen   Body parts bathed by helper: Right arm     Bathing assist Assist Level: Moderate Assistance - Patient 50 - 74%     Upper Body Dressing/Undressing Upper body dressing   What is the patient wearing?: Pull over shirt    Upper body assist Assist Level: Moderate Assistance - Patient 50 - 74%    Lower Body Dressing/Undressing Lower body dressing      What is the patient wearing?: Underwear/pull up, Pants     Lower body assist Assist for lower body dressing: Maximal Assistance - Patient 25 - 49% (bed level)     Toileting Toileting    Toileting assist Assist for toileting: 2 Helpers     Transfers Chair/bed transfer  Transfers assist  Chair/bed transfer activity did not occur: Safety/medical concerns  Chair/bed transfer assist level: Moderate Assistance - Patient 50 -  74%     Locomotion Ambulation   Ambulation assist   Ambulation activity did not occur: Safety/medical concerns  Assist level: Maximal Assistance - Patient 25 - 49% Assistive device:  (RHR) Max distance: 35   Walk 10 feet activity   Assist  Walk 10 feet activity did not occur: Safety/medical concerns  Assist level: Maximal Assistance - Patient 25 - 49% Assistive device:  (RHR)   Walk 50 feet activity   Assist Walk 50 feet with 2 turns activity did not occur: Safety/medical concerns         Walk 150 feet activity   Assist Walk 150 feet activity did not occur: Safety/medical concerns         Walk 10 feet on uneven surface  activity   Assist Walk 10 feet on uneven surfaces activity did not occur: Safety/medical concerns          Wheelchair     Assist Is the patient using a wheelchair?: Yes Type of Wheelchair: Manual    Wheelchair assist level: Dependent - Patient 0% Max wheelchair distance: 150    Wheelchair 50 feet with 2 turns activity    Assist        Assist Level: Dependent - Patient 0%   Wheelchair 150 feet activity     Assist      Assist Level: Dependent - Patient 0%   Blood pressure 116/72, pulse 78, temperature 98.4 F (36.9 C), temperature source Oral, resp. rate 17, height 5\' 7"  (1.702 m), weight 68.8 kg, SpO2 99%.  Medical Problem List and Plan: 1. Functional deficits secondary to right basal ganglia large ICH 12/16/23             -patient may shower             -ELOS/Goals: 6/17 minA goals  Team conf in am   2.  Antithrombotics: -DVT/anticoagulation:  Pharmaceutical: SCDs, heparin  5000U q8h             -antiplatelet therapy: N/A  3. Pain Management: Tylenol  prn. Continue gabapentin  200mg  BID -  Oxycodone  2.5mg  q6h PRN   not using    5/28 having intermittent muscular pain it appears, left hip pain better   -continue above as well as heat/ice/rom/oob  5/30 pain improved. 4. Mood/Behavior/Sleep: LCSW to follow for evaluation and support.              -antipsychotic agents: N/A  -Seroquel    Ativan  0.5mg  q6h PRN, melatonin 5mg  PRN  -5/29-   seroquel  reduced to at bedtime only -5/30 if he does well this weekend, can change seroquel  to at bedtime prn.  He already is much brighter with reduction to HS    -he is also using melatonin prn which we could schedule -01/06/24 will change seroquel  to at bedtime PRN and schedule melatonin at bedtime; pt agreeable -01/07/24 pt slept well, woke up early but not bothered by that, much more awake this morning-- leave meds as is 5. Neuropsych/cognition: This patient is not capable of making decisions on his own behalf.  6. Skin/Wound Care: Routine pressure relief measures.   7. Fluids/Electrolytes/Nutrition: Monitor I/O. Monitor routine  labs. Continue vitamins/supplements.  -Dys 3 diet with thins (SLP to eval)   -Intake improved.  Does better with food from home as well. 8. Metabolic encephalopathy/ETOH withdrawal: Has resolved, wean seroquel  and ativan  as tolerated. Continue lactulose  30g BID   5/27 ammonia level 23  9. Abnormal LFTs:  AST/ALT trending down.    -recheck Monday 6/2  10. ABLA?: Hgb 12's, monitor Hgb weekly   11. HTN: no meds PTA, started on amlodipine  10mg  daily, coreg  25mg  BID, losartan  100mg  daily, hydralazine  PRN, labetalol  PRN. SBP goal <160 6/3 controlled   Vitals:   01/05/24 1251 01/05/24 2011 01/06/24 0529 01/06/24 1257  BP: 104/68 108/78 104/65 106/77   01/06/24 1900 01/07/24 0506 01/07/24 1331 01/07/24 2018  BP: 107/76 111/79 115/77 114/77   01/08/24 0650 01/08/24 1243 01/08/24 2218 01/09/24 0542  BP: 105/69 112/66 121/81 116/72      12. Tobacco abuse: continue nicoderm patches.   13. GI ppx: continue Protonix  40mg  daily  14. Bowel management: continue Miralax  daily  -  LBM 5/29 , is now continent  -5/30 stools liquidy---back off miralax  -01/06/24 LBM this morning, not sure what type since not documented; monitor for now -01/07/24 LBM continent  15. HLD: LDL 124, goal LDL <70, per d/c summary, start atorvastatin  40mg  once LFTs downtrend and repeat in 1 week -5/27 LFT's better yesterday -resumed atorvastatin    -LFTs cont to improved 6/2  LOS: 10 days A FACE TO FACE EVALUATION WAS PERFORMED  Genetta Kenning 01/09/2024, 8:11 AM

## 2024-01-09 NOTE — Progress Notes (Signed)
 Occupational Therapy Session Note  Patient Details  Name: Troy Bishop MRN: [id number] Date of Birth: 1991-07-01  Today's Date: 01/09/2024 OT Individual Time: 1050-1210 OT Individual Time Calculation (min): 80 min (unattended estim 1210-1240)    Short Term Goals: Week 2:  OT Short Term Goal 1 (Week 2): Pt will transfer to his L side to toilet with min A. OT Short Term Goal 2 (Week 2): Pt will be able to hold static stand during toileting with min A. OT Short Term Goal 3 (Week 2): Pt will don tshirt with min A. OT Short Term Goal 4 (Week 2): Pt will use LUE as a stabilizing A with min A,  Skilled Therapeutic Interventions/Progress Updates:    Pt received in bed leaning all the way against the R bedrail.  Pt seemed to be unaware of his positioning, with multiple cues, pt bridged hips to scoot body to center of bed and then with mod cues and assist pt rolled onto R side to push up with R arm.  From EOB, focused on sit balance and then used stedy lift for stand balance. Positioned pt in front of mirror for feedback and pt worked on static stand with B hands on bar with cues to keep body positioned in midline. Pt able to achieve with close supervision in stedy and held balance for up to 2 minutes.   Pt transferred to [facility] in shower. He maintained sit balance well during shower. See ADL documentation below.   Pt then transferred to EOB to dress.   He did well with sit to stands and holding stand balance with min/mod A while pulling up pants.  Squat pivot to L with min/mod A.  In gym, LUE NMR using arm skate board for shoulder and elbow activation, pt able to activate sh to push skate forward.  Placed estim on forearm to facilitate finger extension (pt can now actively flex fingers after estim session yesterday).  On On cycle, pt opened fingers partially to reach for a cone and on and off cycle pt worked on squeezing fingers around cone to grasp cone.  With mod A pt moved cones and passed them from side to  side.   Continues to have significant sh subluxation in ant position.  Placed estim on deltoids which eliminated subluxation. Left estim on for unattended estim for 30 min 10 sec on and 10 off at intensity 25.    Pt returned to room,  belt alarm on and all need met.   Estim removed at 1245. No adverse affects.   Therapy Documentation Precautions:  Precautions Precautions: Fall Precaution/Restrictions Comments: L hemi, neglect Restrictions Weight Bearing Restrictions Per Provider Order: No    Vital Signs:   Pain: Pain Assessment Pain Scale: 0-10 Pain Score: 0-No pain ADL: ADL Eating: Moderate assistance Grooming: Minimal assistance Upper Body Bathing: Moderate assistance Where Assessed-Upper Body Bathing: Sitting at sink Lower Body Bathing: Maximal assistance Where Assessed-Lower Body Bathing: Sitting at sink Upper Body Dressing: Moderate assistance Where Assessed-Upper Body Dressing: Sitting at sink Lower Body Dressing: Maximal assistance Where Assessed-Lower Body Dressing: Sitting at sink      Therapy/Group: Individual Therapy  Maitland Muhlbauer 01/09/2024, 12:41 PM

## 2024-01-09 NOTE — Progress Notes (Signed)
 Occupational Therapy Session Note  Patient Details  Name: Braxtin Bamba MRN: [id number] Date of Birth: [birth year]  Today's Date: 01/09/2024 OT Individual Time: 1305-1400 OT Individual Time Calculation (min): 55 min    Short Term Goals: Week 2:  OT Short Term Goal 1 (Week 2): Pt will transfer to his L side to toilet with min A. OT Short Term Goal 2 (Week 2): Pt will be able to hold static stand during toileting with min A. OT Short Term Goal 3 (Week 2): Pt will don tshirt with min A. OT Short Term Goal 4 (Week 2): Pt will use LUE as a stabilizing A with min A,  Skilled Therapeutic Interventions/Progress Updates:    Patient agreeable to participate in OT session. Reports intermittent pain in hips which is self inflicted by bilateral hip abduction pushing legs/hips against chair. Pt instructed to refrain from action that causes pain.    Patient participated in skilled OT session focusing on NM re-ed.  - Kinsio tape applied to left shoulder to assist in reducing shoulder subluxation and promote neuromuscular re-education. Taping was used to support joint alignment, provide proprioceptive input, and facilitate improved muscle activation for enhanced UE stability during functional tasks.  - Prior to taping, OT palpated 1 finger width subluxation in shoulder. Pt educated on purpose of tape, length of wear time, and removal process. No adverse skin reactions demonstrated during session. Pt verbalized understanding of education.   - Standing frame utilized to offset weightbearing d/t decreased trunk control and UE/LE weakness. UE elevated on platform to facilitate pelvic and trunk tilting towards the unaffected side. Enhanced further with LLE elevated on 2 inch step. Tactile and VC provided to perform side to side weight shifting.  -  Addressed left side neglect during session with pt cued to located LUE when demonstrating neglect as well as participating in visual scanning activity at a distance and near  also.     Therapy Documentation Precautions:  Precautions Precautions: Fall Precaution/Restrictions Comments: L hemi, neglect Restrictions Weight Bearing Restrictions Per Provider Order: No  Therapy/Group: Individual Therapy  Carollee Circle, OTR/L,CBIS  Supplemental OT - [facility] and [facility] Secure Chat Preferred   01/09/2024, 8:24 AM

## 2024-01-09 NOTE — Plan of Care (Signed)
  Problem: Consults Goal: RH STROKE PATIENT EDUCATION Description: See Patient Education module for education specifics  Outcome: Progressing   Problem: RH BOWEL ELIMINATION Goal: RH STG MANAGE BOWEL WITH ASSISTANCE Description: STG Manage Bowel with supervision Assistance. Outcome: Progressing   Problem: RH BLADDER ELIMINATION Goal: RH STG MANAGE BLADDER WITH ASSISTANCE Description: STG Manage Bladder With  supervision Assistance Outcome: Progressing   Problem: RH SKIN INTEGRITY Goal: RH STG SKIN FREE OF INFECTION/BREAKDOWN Description: Manage skin free of infection/breakdown with supervision assistance Outcome: Progressing   Problem: RH SAFETY Goal: RH STG ADHERE TO SAFETY PRECAUTIONS W/ASSISTANCE/DEVICE Description: STG Adhere to Safety Precautions With supervision  Assistance/Device. Outcome: Progressing   Problem: RH PAIN MANAGEMENT Goal: RH STG PAIN MANAGED AT OR BELOW PT'S PAIN GOAL Description: < 4 w/prns Outcome: Progressing   Problem: RH KNOWLEDGE DEFICIT Goal: RH STG INCREASE KNOWLEDGE OF HYPERTENSION Description: Manage increase knowledge of hypertension with supervision assistance from wife using educational materials provided Outcome: Progressing Goal: RH STG INCREASE KNOWLEDGE OF DYSPHAGIA/FLUID INTAKE Outcome: Progressing Goal: RH STG INCREASE KNOWLEGDE OF HYPERLIPIDEMIA Description: Manage increase knowledge of hyperlipidemia  with supervision assistance from wife using educational materials provided Outcome: Progressing Goal: RH STG INCREASE KNOWLEDGE OF STROKE PROPHYLAXIS Description: Manage increase knowledge of stroke prophylaxis with supervision assistance from wife using educational materials provided Outcome: Progressing   Problem: RH Vision Goal: RH LTG Vision (Specify) Outcome: Progressing

## 2024-01-09 NOTE — Progress Notes (Signed)
 Speech Language Pathology Daily Session Note  Patient Details  Name: Troy Bishop MRN: [id number] Date of Birth: [birth year]  Today's Date: 01/09/2024 SLP Individual Time: 8295-6213 SLP Individual Time Calculation (min): 56 min  Short Term Goals: Week 2: SLP Short Term Goal 1 (Week 2): Patient will consume current diet with use of swallowing compensatory strategies given min multimodal A SLP Short Term Goal 2 (Week 2): Patient will utilize speech intelligibility strategies to reach 85% intelligibility at the sentence level given mod verbal A SLP Short Term Goal 3 (Week 2): Patient will demonstrate problem solving abilities during basic daily situations given mod multimodal A SLP Short Term Goal 4 (Week 2): Patient will demonstrate awareness of current deficits by verbalizing cognitive and physical changes given mod multimodal A SLP Short Term Goal 5 (Week 2): Patient will sustain attention to the left during functional tasks for 5 minutes given mod multimodal A  Skilled Therapeutic Interventions:  Patient was seen in am to address cognitive re- training and speech intelligibility. Pt was easily alerted upon SLP arrival and agreeable for session. Pt's wife was present and an active participant in session. Pt warranting min A to recall dx of CVA. He demonstrated awareness of physical changes to LUE and LLE weakness. Given min A he identified speech intelligibility changes however continued to demonstrate limited awareness of cognitive deficits. Pt recalled 2 of 4 speech intelligibility strategies indep. SLP reviewed remaining strategies and examples of utilization. He was subsequently challenged in a structured task where he communicated an unknown message to listener. Pt warranting mod A for over articulation and slow rate. Pt ~80- 85% intelligibility at the phrase to sentence level. During task, pt warranting min to mod A to attend to left visual field during structured task. In other minutes of session  SLP addressed problem solving through challenging pt to identify precautions. Pt identified uses of call button with sup A. He identified swallowing strategies with min A. At conclusion of session, pt was left upright in bed with call button within reach. SLP to continue POC.   Pain Pain Assessment Pain Scale: 0-10 Pain Score: 0-No pain  Therapy/Group: Individual Therapy  Adela Holter 01/09/2024, 12:31 PM

## 2024-01-10 DIAGNOSIS — F411 Generalized anxiety disorder: Secondary | ICD-10-CM

## 2024-01-10 NOTE — Progress Notes (Signed)
 Speech Language Pathology Daily Session Note  Patient Details  Name: Troy Bishop MRN: [id number] Date of Birth: [birth year]  Today's Date: 01/10/2024 SLP Individual Time: 1101-1157 SLP Individual Time Calculation (min): 56 min  Short Term Goals: Week 2: SLP Short Term Goal 1 (Week 2): Patient will consume current diet with use of swallowing compensatory strategies given min multimodal A SLP Short Term Goal 2 (Week 2): Patient will utilize speech intelligibility strategies to reach 85% intelligibility at the sentence level given mod verbal A SLP Short Term Goal 3 (Week 2): Patient will demonstrate problem solving abilities during basic daily situations given mod multimodal A SLP Short Term Goal 4 (Week 2): Patient will demonstrate awareness of current deficits by verbalizing cognitive and physical changes given mod multimodal A SLP Short Term Goal 5 (Week 2): Patient will sustain attention to the left during functional tasks for 5 minutes given mod multimodal A  Skilled Therapeutic Interventions: Skilled therapy session focused on dysarthria and cognitive goals. SLP facilitated session by prompting patient to review SLOP strategies. Patient recalled 3/4 strategies independently and SLP re-educated on remainder. During todays session patient was 80% intelligible at the sentence level given modA. SLP targeted cognitive goals through medication management task. Patient was provided written and verbalized instructions to place "pills" in BID pill box. Patient completed activity with min-modA to attend to task and follow directions appropriately. Patient with occasional mistakes, though able to self correct given minA. Patient endorses sadness this date and consistent "overthinking" in the PM. Patient verbalized wanting to go home to see children and return next day. SLP educated patient that [facility] is an inpatient program and therefore requires him to stay overnight. Patient may benefit from a neuropsychological  evaluation. At the end of the session, patient with reduced attention to task requiring maxA to eliminate use of phone until session was over. Patient left in chair with alarm set and call bell in reach. Continue POC.  Pain None reported   Therapy/Group: Individual Therapy  Ismael Karge M.A., CCC-SLP 01/10/2024, 7:55 AM

## 2024-01-10 NOTE — Progress Notes (Signed)
 Patient ID: Troy Bishop, male   DOB: 1990/10/15, 33 y.o.   MRN: [id number]  SW met with pt and pt husband in room to provide updates from team conference, and d/c date remains 6/17. He remains eager to leave, however, when redirected amenable to staying. Wife sks about FMLA. SW shared will follow-up once there is more information. SW will update once there are more updates.   Troy Bishop, MSW, LCSW Office: [phone] Cell: [phone] Fax: [phone]

## 2024-01-10 NOTE — Plan of Care (Signed)
  Problem: Consults Goal: RH STROKE PATIENT EDUCATION Description: See Patient Education module for education specifics  Outcome: Progressing   Problem: RH BOWEL ELIMINATION Goal: RH STG MANAGE BOWEL WITH ASSISTANCE Description: STG Manage Bowel with  supervision Assistance. Outcome: Progressing   Problem: RH BLADDER ELIMINATION Goal: RH STG MANAGE BLADDER WITH ASSISTANCE Description: STG Manage Bladder With supervision Assistance Outcome: Progressing   Problem: RH SKIN INTEGRITY Goal: RH STG SKIN FREE OF INFECTION/BREAKDOWN Description: Manage skin free of infection/breakdown with supervision assistance Outcome: Progressing   Problem: RH SAFETY Goal: RH STG ADHERE TO SAFETY PRECAUTIONS W/ASSISTANCE/DEVICE Description: STG Adhere to Safety Precautions With supervision Assistance/Device. Outcome: Progressing

## 2024-01-10 NOTE — Progress Notes (Signed)
 PROGRESS NOTE   Subjective/Complaints:  Per OT progressing well with therapy Discussed tent d/c date ROS: as per HPI. Denies CP, SOB, abd pain, N/V/D/C, or any other complaints at this time.    Objective:   No results found. Recent Labs    01/08/24 0928  WBC 5.8  HGB 12.2*  HCT 37.0*  PLT 112*    Recent Labs    01/08/24 0519  NA 138  K 3.7  CL 107  CO2 22  GLUCOSE 97  BUN 8  CREATININE 0.59*  CALCIUM  8.9         Intake/Output Summary (Last 24 hours) at 01/10/2024 0941 Last data filed at 01/09/2024 1300 Gross per 24 hour  Intake 240 ml  Output --  Net 240 ml        Physical Exam: Vital Signs Blood pressure 114/77, pulse 73, temperature 98.1 F (36.7 C), resp. rate 19, height 5\' 7"  (1.702 m), weight 70.2 kg, SpO2 99%.    General: No acute distress Mood and affect are appropriate Heart: Regular rate and rhythm no rubs murmurs or extra sounds Lungs: Clear to auscultation, breathing unlabored, no rales or wheezes Abdomen: Positive bowel sounds, soft nontender to palpation, nondistended Extremities: No clubbing, cyanosis, or edema Skin: No evidence of breakdown, no evidence of rash   PRIOR EXAMS: Neuro: attention a little better, Alert and oriented x3  Left facial droop. Speech remains dysarthric LLE 3- left hip add, 3- Left hip flexion trace quad  and distally is 0/5, LUE 2-/5 -->forearm supination , finger flexion Right sided strength is 5/5. Decreased sensation throughout left side although can sense gross touch and pain. Increased DTR's LLE with ongoing sustained clonus at ankle. Heel cord tight but can be ranged to neutral.  Musc: no back pain or hip pain today   Assessment/Plan: 1. Functional deficits which require 3+ hours per day of interdisciplinary therapy in a comprehensive inpatient rehab setting. Physiatrist is providing close team supervision and 24 hour management of active medical  problems listed below. Physiatrist and rehab team continue to assess barriers to discharge/monitor patient progress toward functional and medical goals  Care Tool:  Bathing    Body parts bathed by patient: Left arm, Chest, Abdomen   Body parts bathed by helper: Right arm     Bathing assist Assist Level: Moderate Assistance - Patient 50 - 74%     Upper Body Dressing/Undressing Upper body dressing   What is the patient wearing?: Pull over shirt    Upper body assist Assist Level: Moderate Assistance - Patient 50 - 74%    Lower Body Dressing/Undressing Lower body dressing      What is the patient wearing?: Underwear/pull up, Pants     Lower body assist Assist for lower body dressing: Maximal Assistance - Patient 25 - 49% (bed level)     Toileting Toileting    Toileting assist Assist for toileting: 2 Helpers     Transfers Chair/bed transfer  Transfers assist  Chair/bed transfer activity did not occur: Safety/medical concerns  Chair/bed transfer assist level: Moderate Assistance - Patient 50 - 74%     Locomotion Ambulation   Ambulation assist   Ambulation activity  did not occur: Safety/medical concerns  Assist level: Maximal Assistance - Patient 25 - 49% Assistive device:  (RHR) Max distance: 35   Walk 10 feet activity   Assist  Walk 10 feet activity did not occur: Safety/medical concerns  Assist level: Maximal Assistance - Patient 25 - 49% Assistive device:  (RHR)   Walk 50 feet activity   Assist Walk 50 feet with 2 turns activity did not occur: Safety/medical concerns         Walk 150 feet activity   Assist Walk 150 feet activity did not occur: Safety/medical concerns         Walk 10 feet on uneven surface  activity   Assist Walk 10 feet on uneven surfaces activity did not occur: Safety/medical concerns         Wheelchair     Assist Is the patient using a wheelchair?: Yes Type of Wheelchair: Manual    Wheelchair  assist level: Dependent - Patient 0% Max wheelchair distance: 150    Wheelchair 50 feet with 2 turns activity    Assist        Assist Level: Dependent - Patient 0%   Wheelchair 150 feet activity     Assist      Assist Level: Dependent - Patient 0%   Blood pressure 114/77, pulse 73, temperature 98.1 F (36.7 C), resp. rate 19, height 5\' 7"  (1.702 m), weight 70.2 kg, SpO2 99%.  Medical Problem List and Plan: 1. Functional deficits secondary to right basal ganglia large ICH 12/16/23             -patient may shower             -ELOS/Goals: 6/17 minA goals  Team conference today please see physician documentation under team conference tab, met with team  to discuss problems,progress, and goals. Formulized individual treatment plan based on medical history, underlying problem and comorbidities.   2.  Antithrombotics: -DVT/anticoagulation:  Pharmaceutical: SCDs, heparin  5000U q8h             -antiplatelet therapy: N/A  3. Pain Management: Tylenol  prn. Continue gabapentin  200mg  BID -  Oxycodone  2.5mg  q6h PRN   not using    5/28 having intermittent muscular pain it appears, left hip pain better   -continue above as well as heat/ice/rom/oob  5/30 pain improved. 4. Mood/Behavior/Sleep: LCSW to follow for evaluation and support.              -antipsychotic agents: N/A  -Seroquel    Ativan  0.5mg  q6h PRN, melatonin 5mg  PRN  -5/29-   seroquel  reduced to at bedtime only -5/30 if he does well this weekend, can change seroquel  to at bedtime prn.  He already is much brighter with reduction to HS    -he is also using melatonin prn which we could schedule -01/06/24 will change seroquel  to at bedtime PRN and schedule melatonin at bedtime; pt agreeable -01/07/24 pt slept well, woke up early but not bothered by that, much more awake this morning-- leave meds as is 5. Neuropsych/cognition: This patient is not capable of making decisions on his own behalf.  6. Skin/Wound Care: Routine  pressure relief measures.   7. Fluids/Electrolytes/Nutrition: Monitor I/O. Monitor routine labs. Continue vitamins/supplements.  -Dys 3 diet with thins (SLP to eval)   -Intake improved.  Does better with food from home as well. 8. Metabolic encephalopathy/ETOH withdrawal: Has resolved, wean seroquel  and ativan  as tolerated. Continue lactulose  30g BID   5/27 ammonia level 23  9. Abnormal LFTs:  AST/ALT trending down.    -recheck Monday 6/2   10. ABLA?: Hgb 12's, monitor Hgb weekly   11. HTN: no meds PTA, started on amlodipine  10mg  daily, coreg  25mg  BID, losartan  100mg  daily, hydralazine  PRN, labetalol  PRN. SBP goal <160 6/3 controlled   Vitals:   01/06/24 1257 01/06/24 1900 01/07/24 0506 01/07/24 1331  BP: 106/77 107/76 111/79 115/77   01/07/24 2018 01/08/24 0650 01/08/24 1243 01/08/24 2218  BP: 114/77 105/69 112/66 121/81   01/09/24 0542 01/09/24 1404 01/09/24 2035 01/10/24 0619  BP: 116/72 95/60 123/83 114/77      12. Tobacco abuse: continue nicoderm patches.   13. GI ppx: continue Protonix  40mg  daily  14. Bowel management: continue Miralax  daily  -  LBM 5/29 , is now continent  -5/30 stools liquidy---back off miralax  -01/06/24 LBM this morning, not sure what type since not documented; monitor for now -01/09/24 LBM continent  15. HLD: LDL 124, goal LDL <70, per d/c summary, start atorvastatin  40mg  once LFTs downtrend and repeat in 1 week -5/27 LFT's better yesterday -resumed atorvastatin    -LFTs cont to improved 6/2  LOS: 11 days A FACE TO FACE EVALUATION WAS PERFORMED  Genetta Kenning 01/10/2024, 9:41 AM

## 2024-01-10 NOTE — Progress Notes (Signed)
 Physical Therapy Weekly Progress Note  Patient Details  Name: Troy Bishop MRN: [id number] Date of Birth: 1990/10/26  Beginning of progress report period: Dec 31, 2023 End of progress report period: Dec 10, 2023  Patient has met 4 of 4 short term goals. Pt is making slow progress towards LTG's. Pt's gait has been assessed with GRAFO introduced due to poor ankle stability and buckling at L knee. Pt has improved L quadriceps activation vs hip flexor activation (able to isolate movement better vs using hip flexors to compensate). Pt continues to be slightly impulsive, but has improved since evaluation. Pt continues to have poor awareness of deficits, but is starting to understand gravity of presentation (perseverates on returning to work the next day or going home despite consistent education on how pt still requires a lot of assistance and will benefit from intensive rehabilitation to decrease caregiver burden, and increase pt's functional independence.   Patient continues to demonstrate the following deficits {impairments:3041632} and therefore will continue to benefit from skilled PT intervention to increase functional independence with mobility.  Patient {LTG progression:3041653}.  {plan of XLKG:4010272}  PT Short Term Goals Week 1:  PT Short Term Goal 1 (Week 1): Pt will transfer sup to sit w/ mod A consistently. PT Short Term Goal 1 - Progress (Week 1): Met PT Short Term Goal 2 (Week 1): Pt will maintain seated balance x 5' w/ CGA PT Short Term Goal 2 - Progress (Week 1): Met PT Short Term Goal 3 (Week 1): Pt will transfer sit to stand w/ mod A + 1 PT Short Term Goal 3 - Progress (Week 1): Met PT Short Term Goal 4 (Week 1): PT to assess gait. PT Short Term Goal 4 - Progress (Week 1): Met  Skilled Therapeutic Interventions/Progress Updates:      Therapy Documentation Precautions:  Precautions Precautions: Fall Precaution/Restrictions Comments: L hemi, neglect Restrictions Weight Bearing  Restrictions Per Provider Order: No  Floris Neuhaus PTA  01/10/2024, 12:49 PM

## 2024-01-10 NOTE — Progress Notes (Signed)
 Occupational Therapy Session Note  Patient Details  Name: Troy Bishop MRN: [id number] Date of Birth: 1991/05/06  Today's Date: 01/10/2024 OT Individual Time: 6578-4696 OT Individual Time Calculation (min): 60 min (unattended [phone] unattended estim)   Short Term Goals: Week 1:  OT Short Term Goal 1 (Week 1): Pt will maintain sitting balance on toilet with min A with max cues OT Short Term Goal 1 - Progress (Week 1): Met OT Short Term Goal 2 (Week 1): Pt will don shirt with mod A with cues OT Short Term Goal 2 - Progress (Week 1): Met OT Short Term Goal 3 (Week 1): Pt will attend to left visual field to obtain items for ADL with min questioning cuing OT Short Term Goal 3 - Progress (Week 1): Met OT Short Term Goal 4 (Week 1): Pt will transfer with mod A +1 consistently OT Short Term Goal 4 - Progress (Week 1): Met Week 2:  OT Short Term Goal 1 (Week 2): Pt will transfer to his L side to toilet with min A. OT Short Term Goal 2 (Week 2): Pt will be able to hold static stand during toileting with min A. OT Short Term Goal 3 (Week 2): Pt will don tshirt with min A. OT Short Term Goal 4 (Week 2): Pt will use LUE as a stabilizing A with min A,  Skilled Therapeutic Interventions/Progress Updates:     Pt received in bed with wife in room . Pt sitting on far edge of bed near rail.  Pt able to scoot himself to the center with min A and cues.  Had pt try to actively use L side with prepping to for rolling.  Pt able to initiate knee and hip flexion and sh add to bring hand onto lap with min A.  Pt rolled to R shoulder and pushed to sit with min A.   Sat well on EOB with occasional guarding assist as he doffed and donned shirt min A and MOD cues to slow down and follow steps.  LB dressing continues to be challenging as he pushes to left in standing. He had more difficulty finding midline balance today vs yesterday.  Mod - max stand balance with dressing.  Focused on finding midline and trying to activate  his L knee.  Squat pivot transfer to L to w/c more controlled with only min A needed.  Pt transported to gym to focus on LUE motor control. Used UE ranger for sh flexion and extension with vibration stimulation to biceps with sh ext and triceps sh flex to further increase input to achieve movement.  Pt able to move 50 % of range using glider.    Estim to finger flexors as pt worked on actively grasping onto cones during a grasp release exercise for 15 minutes.  Estim placed on deltoids at a slightly inferior point due to placement of kinesiotape.  A good deltoid contraction was able to be elicited to reduce subluxation on intensity 28.  Pt tolerated well. Left estim on for 60 min unattended for cyclic stimulation.  Pt's PT removed at 1030 for start of his session. No adverse affects.  Pt resting in TIS w/c in room with all needs met.  Alarm belt on.    Therapy Documentation Precautions:  Precautions Precautions: Fall Precaution/Restrictions Comments: L hemi, neglect Restrictions Weight Bearing Restrictions Per Provider Order: No Pain: Pain Assessment Pain Scale: 0-10 Pain Score: 0-No pain    Therapy/Group: Individual Therapy  Layana Konkel 01/10/2024, 10:20 AM

## 2024-01-10 NOTE — Progress Notes (Signed)
 Physical Therapy Session Note  Patient Details  Name: Troy Bishop MRN: [id number] Date of Birth: Dec 07, 1990  Today's Date: 01/10/2024 PT Individual Time: 1036 - 1104 PT Individual Time Calculation (min): 28 min PT Individual Time: 1410-1507 PT Individual Time Calculation (min): 57 min   Short Term Goals: Week 1:  PT Short Term Goal 1 (Week 1): Pt will transfer sup to sit w/ mod A consistently. PT Short Term Goal 1 - Progress (Week 1): Met PT Short Term Goal 2 (Week 1): Pt will maintain seated balance x 5' w/ CGA PT Short Term Goal 2 - Progress (Week 1): Met PT Short Term Goal 3 (Week 1): Pt will transfer sit to stand w/ mod A + 1 PT Short Term Goal 3 - Progress (Week 1): Met PT Short Term Goal 4 (Week 1): PT to assess gait. PT Short Term Goal 4 - Progress (Week 1): Met  SESSION 1 Skilled Therapeutic Interventions/Progress Updates: Patient sitting in TIS on entrance to room. Patient alert and agreeable to PT session.   Patient with no complaints of pain.   - PTA adjusted pt's TIS back so avoid L lean. Prior to doing so, pt transferred from TIS<>edge of mat via SPT and CGA/light minA and cues for set up/sequence. - PTA donned L GRAFO and pt ambulated roughly 90' with RHR and overall mod/maxA to maintain sequence, standing balance, occasional L step placement and extension at L knee. Pt L heel raises from floor most of trial with improvement after tactile cue/manual facilitation (maxA) to quadriceps during stance phase (education/demonstration provided following). Pt ambulated short distance to assess GRAFO impact to pt's gait cycle with noted improvement. Pt continues to demonstration decreased quadriceps activation vs hip flexor activation.   Patient sitting in TIS at end of session with brakes locked, belt alarm set, and all needs within reach.  SESSION 2 Skilled Therapeutic Interventions/Progress Updates: Patient sitting in TIS with wife present on entrance to room. Patient alert and  agreeable to PT session.   Patient with no complaints of pain. Pt and pt wife further educated on pt's spastic presentation in L LE during this session, and what future sessions will consist of, including increasing HS NMRE in L LE  Therapeutic Activity: Bed Mobility: Pt performed sit<supine<L sidelying<prone on mat with minA. Max VC required to maintain safety awareness to L UE and sequence and controled movement. Transfers: Pt performed SPT transfers throughout session with CGA/light minA. Provided VC for set up and hip relationship.  Gait Training:  Pt ambulated 35' x 1 using RHR with mod/heavy modA and L GRAFO donned. Pt with improved advancement of L LE through swing with spasticity noted to occur when attempting to press heel into ground (cues to engage quadriceps). Spasticity decreased shortly after taking few steps, but still present. Pt required max cuing to maintain pattern of heel-strike/maintaining heel of floor as much as able. Pt required mod/maxA to safely place LE at terminal swing. maxA for weight shifting and to increase upright posture.  Pt ambulated roughly 60' x 1 with B HHA (PTA on L, and tech on R) with same cues as above. Pt with improved heel touch to floor when provided with faster cuing to increase cadence (slight decrease in spasticity, but still noted to affect pt's ability to place heel onto floor during stance).  Neuromuscular Re-ed: - Maintaining knee extension short sitting in TIS L LE (increase in spasticity to maintain extension). Multiple reps performed with cues for pt to coordinate extension before  spasticity occurs (potentially from significant decrease in HS activation vs quadriceps), and to hold (unable to maintain longer than 1-2 seconds, and often need assistance to obtain ROM).  - Pt R sidelying on mat with cues to perform knee flexion on L LE (gravity minimized) with pt compensating with glutes (PTA biased knee flexors by extending hip slightly to avoid).  Little to no HS activation noted, even when pt cued to transfer to prone for eccentric control and max/totalA (eventually introduced massager to increase tactile feedback with no change in presentation).   NMR performed for improvements in motor control and coordination, balance, sequencing, judgement, and self confidence/ efficacy in performing all aspects of mobility at highest level of independence.   Patient sitting in TIS at end of session with brakes locked, wife present, bed alarm set, and all needs within reach.       Therapy Documentation Precautions:  Precautions Precautions: Fall Precaution/Restrictions Comments: L hemi, neglect Restrictions Weight Bearing Restrictions Per Provider Order: No   Therapy/Group: Individual Therapy  Zebulin Siegel PTA 01/10/2024, 3:17 PM

## 2024-01-10 NOTE — Patient Care Conference (Signed)
 Inpatient RehabilitationTeam Conference and Plan of Care Update Date: 01/10/2024   Time: 10:21 AM    Patient Name: Troy Bishop      Medical Record Number: [id number]  Date of Birth: 1991-04-19 Sex: Male         Room/Bed: 4W20C/4W20C-01 Payor Info: Payor: [place] MEDICAID PREPAID HEALTH PLAN / Plan: [place] MEDICAID [employer] [place] / Product Type: *No Product type* /    Admit Date/Time:  12/30/2023  5:40 PM  Primary Diagnosis:  ICH (intracerebral hemorrhage) [facility])  Hospital Problems: Principal Problem:   ICH (intracerebral hemorrhage) [facility])    Expected Discharge Date: Expected Discharge Date: 01/23/24  Team Members Present: Physician leading conference: Dr. Janeece Mechanic Social Worker Present: Norval Been, LCSW Nurse Present: Forrestine Ike, RN PT Present: Oma Bias, PT;Dominic Sandoval, PTA OT Present: Kenda Paula, OT SLP Present: Reggie Caper, SLP     Current Status/Progress Goal Weekly Team Focus  Bowel/Bladder      Continent of bowel and bladder          Swallow/Nutrition/ Hydration   D3/ thin   min A  education and use of swallowing compensatory strategies    ADL's   min A with UB bathing and dressing, mod A LB self care and toileting,  min A transfers to the R and mod to the L, improved trunk control in sitting and standing but continues to have a L bias.  LUE tone and active movement progressing. Has active shoulder, elbow and finger flexion at 20% of range.   CGA to min A overall   NMR, postural control, ADL training, family education, LUE NMR with estim/taping    Mobility   bed Mobility = can be minA when following cues and slowing down movement; Transfers (CGA SPT); Ambulation overall maxA with RHR   supervision for bed mobility, minA for transfers and ambulation, supervision for sitting and standing balance - may need to be downgraded  Barriers: Impulsive, reduced understanding of deficits, decreased awareness/attentin of L hemibody///Foucs  L hemi NMR, estim quads, attn to task, standing balance with L quadriceps engagement, pt/family ed    Communication   ~80% intelligible at phrase and sentence level   min A 90% at phrase   education and use of SLOP strategies    Safety/Cognition/ Behavioral Observations  mod A- significantly improved attention this week   min A   awareness, STM, basic problem solving, sustained attention to left    Pain      Back pain managed with oxycodone     Pain < 4 with prns    Assess pain q shift and effectiveness of medication  Skin      N/a           Discharge Planning:    Pt will d/c to home with his wife; wife is primary caregiver of their four children. No other supports. SW will confirm there are no barriers to discharge.   Team Discussion: Patient post large ICH with fair truncal control and increased tone in left LE with pain.  Functional progress limited by dysarthria, poor awareness, poor problem solving and impulsivity, perseveration on finances and easily distracted by the phone.  Note improved attention to the left side and midline awareness improved but standing balance fluctuate with weight shifting, etc.  Patient on target to meet rehab goals: yes, currently needs min assist for upper body care and mod assist for lower body care. Completes stand pivot with CGA and able to ambulate with max assist. Has  increased speech intelligibility and maintained on a Dysphagia 3 diet.  Goals for discharge set for min assist overall.  *See Care Plan and progress notes for long and short-term goals.   Revisions to Treatment Plan:  E-stim-taping   Teaching Needs: Safety, medications, dietary modification, transfers, toileting, etc.   Current Barriers to Discharge: Decreased caregiver support and Home enviroment access/layout  Possible Resolutions to Barriers: Family education     Medical Summary Current Status: some improvement in Left hemipresis  Barriers to Discharge: Other  (comments);Medical stability  Barriers to Discharge Comments: ethanol abuse with cirrhosis and reduce PLT count Possible Resolutions to Barriers/Weekly Focus: NM re educatio , caregiver training , limitied post hospital services   Continued Need for Acute Rehabilitation Level of Care: The patient requires daily medical management by a physician with specialized training in physical medicine and rehabilitation for the following reasons: Direction of a multidisciplinary physical rehabilitation program to maximize functional independence : Yes Medical management of patient stability for increased activity during participation in an intensive rehabilitation regime.: Yes Analysis of laboratory values and/or radiology reports with any subsequent need for medication adjustment and/or medical intervention. : Yes   I attest that I was present, lead the team conference, and concur with the assessment and plan of the team.   Forrestine Ike B 01/10/2024, 1:31 PM

## 2024-01-10 NOTE — Plan of Care (Signed)
  Problem: Consults Goal: RH STROKE PATIENT EDUCATION Description: See Patient Education module for education specifics  Outcome: Progressing   Problem: RH BOWEL ELIMINATION Goal: RH STG MANAGE BOWEL WITH ASSISTANCE Description: STG Manage Bowel with supervision Assistance. Outcome: Progressing   Problem: RH BLADDER ELIMINATION Goal: RH STG MANAGE BLADDER WITH ASSISTANCE Description: STG Manage Bladder With  supervision Assistance Outcome: Progressing   Problem: RH SKIN INTEGRITY Goal: RH STG SKIN FREE OF INFECTION/BREAKDOWN Description: Manage skin free of infection/breakdown with supervision assistance Outcome: Progressing   Problem: RH SAFETY Goal: RH STG ADHERE TO SAFETY PRECAUTIONS W/ASSISTANCE/DEVICE Description: STG Adhere to Safety Precautions With supervision  Assistance/Device. Outcome: Progressing   Problem: RH PAIN MANAGEMENT Goal: RH STG PAIN MANAGED AT OR BELOW PT'S PAIN GOAL Description: < 4 w/prns Outcome: Progressing   Problem: RH KNOWLEDGE DEFICIT Goal: RH STG INCREASE KNOWLEDGE OF HYPERTENSION Description: Manage increase knowledge of hypertension with supervision assistance from wife using educational materials provided Outcome: Progressing Goal: RH STG INCREASE KNOWLEDGE OF DYSPHAGIA/FLUID INTAKE Outcome: Progressing Goal: RH STG INCREASE KNOWLEGDE OF HYPERLIPIDEMIA Description: Manage increase knowledge of hyperlipidemia  with supervision assistance from wife using educational materials provided Outcome: Progressing Goal: RH STG INCREASE KNOWLEDGE OF STROKE PROPHYLAXIS Description: Manage increase knowledge of stroke prophylaxis with supervision assistance from wife using educational materials provided Outcome: Progressing   Problem: RH Vision Goal: RH LTG Vision (Specify) Outcome: Progressing

## 2024-01-11 DIAGNOSIS — F411 Generalized anxiety disorder: Secondary | ICD-10-CM

## 2024-01-11 NOTE — Plan of Care (Signed)
  Problem: Consults Goal: RH STROKE PATIENT EDUCATION Description: See Patient Education module for education specifics  Outcome: Progressing   Problem: RH BOWEL ELIMINATION Goal: RH STG MANAGE BOWEL WITH ASSISTANCE Description: STG Manage Bowel with  supervision Assistance. Outcome: Progressing   Problem: RH BLADDER ELIMINATION Goal: RH STG MANAGE BLADDER WITH ASSISTANCE Description: STG Manage Bladder With supervision Assistance Outcome: Progressing   Problem: RH SKIN INTEGRITY Goal: RH STG SKIN FREE OF INFECTION/BREAKDOWN Description: Manage skin free of infection/breakdown with supervision assistance Outcome: Progressing   Problem: RH SAFETY Goal: RH STG ADHERE TO SAFETY PRECAUTIONS W/ASSISTANCE/DEVICE Description: STG Adhere to Safety Precautions With supervision Assistance/Device. Outcome: Progressing

## 2024-01-11 NOTE — Progress Notes (Signed)
 Patient ID: Troy Bishop, male   DOB: [birth year], 33 y.o.   MRN: [id number]  SW sent FMLA forms to [email or web address] .  Norval Been, MSW, LCSW Office: [phone] Cell: [phone] Fax: [phone]

## 2024-01-11 NOTE — Progress Notes (Signed)
 Occupational Therapy Session Note  Patient Details  Name: Troy Bishop MRN: [id number] Date of Birth: October 20, 1990  Today's Date: 01/11/2024 OT Individual Time: 1000-1100 and unattended estim 1100-1145) and 1130-1145  OT Individual Time Calculation (min): 60 min and 15 minutes   Short Term Goals: Week 2:  OT Short Term Goal 1 (Week 2): Pt will transfer to his L side to toilet with min A. OT Short Term Goal 2 (Week 2): Pt will be able to hold static stand during toileting with min A. OT Short Term Goal 3 (Week 2): Pt will don tshirt with min A. OT Short Term Goal 4 (Week 2): Pt will use LUE as a stabilizing A with min A,  Skilled Therapeutic Interventions/Progress Updates:     Visit 1:  Pain:  no c/o pain Pt received in w/c ready for therapy.  Focus of therapy session on family education with wife.  Wife came to gym with pt.   -squat pivot transfer training with demonstration with OT with pt first then had wife practice. Reviewed importance of foot placement,  leaning forward, gaze in opposite direction of movement.  His wife understood the concepts well the most challenging part for OT and wife this session is pt continues to be impulsive and needs FIRM directions so he does not pull himself in unsafe positions.  -spouse will benefit from further practice.  Pt worked on [employer] of LUE with estim to triceps for 10 min with pushing arm out long using UE Ranger with mod A to guide arm,   10 min of finger extension with arm rolling on ball to facilitate a reach and grasp action.  Placed estim on deltoids for unattended estim to decrease subluxation and stimulate deltoid activation.    Pt returned to room at 11am and stated he needed to toilet. Session time over so asked NT to take him to the toilet. Pt and wife in room waiting for nurse tech.   Visit 2: no c/o pain   I observed pt's nurse tech in the hallway looking for the stedy to take him to the bathroom (30 min later).  I had a pt cancellation so I  offered to help.  Had pt complete toilet transfers with min A with +2 for safety due to impulsivity.  Pt able to lean to wipe.  Had his wife assist with clothing management as OT stood with pt with max A due to leaning.  Sat down to pivot back to wc with min A of 1 p.   Applied alarm belt and doffed estim. All needs met.          Therapy Documentation Precautions:  Precautions Precautions: Fall Precaution/Restrictions Comments: L hemi, neglect Restrictions Weight Bearing Restrictions Per Provider Order: No    Vital Signs: Therapy Vitals Temp: 98 F (36.7 C) Pulse Rate: 75 Resp: 18 BP: 107/72 Patient Position (if appropriate): Lying Oxygen Therapy SpO2: 99 % O2 Device: Room Air  ADL: ADL Eating: Moderate assistance Grooming: Minimal assistance Upper Body Bathing: Moderate assistance Where Assessed-Upper Body Bathing: Sitting at sink Lower Body Bathing: Maximal assistance Where Assessed-Lower Body Bathing: Sitting at sink Upper Body Dressing: Moderate assistance Where Assessed-Upper Body Dressing: Sitting at sink Lower Body Dressing: Maximal assistance Where Assessed-Lower Body Dressing: Sitting at sink      Therapy/Group: Individual Therapy  Jshawn Hurta 01/11/2024, 8:44 AM

## 2024-01-11 NOTE — Progress Notes (Signed)
 Physical Therapy Session Note  Patient Details  Name: Troy Bishop MRN: [id number] Date of Birth: 07/17/1991  Today's Date: 01/11/2024 PT Individual Time: 4098-1191 PT Individual Time Calculation (min): 72 min   Short Term Goals: Week 1:  PT Short Term Goal 1 (Week 1): Pt will transfer sup to sit w/ mod A consistently. PT Short Term Goal 1 - Progress (Week 1): Met PT Short Term Goal 2 (Week 1): Pt will maintain seated balance x 5' w/ CGA PT Short Term Goal 2 - Progress (Week 1): Met PT Short Term Goal 3 (Week 1): Pt will transfer sit to stand w/ mod A + 1 PT Short Term Goal 3 - Progress (Week 1): Met PT Short Term Goal 4 (Week 1): PT to assess gait. PT Short Term Goal 4 - Progress (Week 1): Met  Skilled Therapeutic Interventions/Progress Updates: Patient supine in bed with wife present on entrance to room. Patient alert and agreeable to PT session.   Patient reported unrated discomfort/pain in R mid/low back on lateral side (OOB activity provided with pt reporting decrease in pain).  Therapeutic Activity: Bed Mobility: Pt performed supine<>sit on EOB x2 with minA when following sequence to roll to R side, use of R UE to elevate trunk, and to swing B LE's off of bed. Transfers: Pt performed SPT throughout session with overall CGA/minA for set up, safety and sequence of hand placement and head/hip relationship.   Gait Training:  - Pt ambulated roughly 56' with B UE supported modA +2 with L GRAFO donned. Pt cued to maintain upright posture throughout, and to press L heel down (spasticity present when doing so, and when R LE passes through mid-swing to terminal). Pt maxA to prevent buckling L LE that progressed to modA after repetition and multimodal cuing to engage quadriceps. Manual facilitation for weightshift required. Decreased step length R LE with cues to increase throughout. Pt able to have further engagement from quadriceps during stance phase when cadence increases with max cuing for  sequence.  - Pt ambulated 35' in Lite Gait system with maxA and same cues as above (pt with increase in L knee flexion that required heavy maxA). Pt began to use harness system to compensate for LE advancement (did not provide more slack due to decreased safety awareness/coordination and motor control). Overall, pt performed better with B UE support vs this intervention.  NMRE: - Pt static stance with mirror feedback and green theraband down middle with multimodal cues to engage L knee extensors and to lean to R due to excessive L lean. Pt noted to have increase in quadriceps control this time vs previous attempts at standing balance (moments of modA to maintain knee extension, and minA to stand in center briefly - pt unable to maintain longer times as of yet).   Patient sitting in WC at end of session with brakes locked, wife present, belt alarm set, and all needs within reach.      Therapy Documentation Precautions:  Precautions Precautions: Fall Precaution/Restrictions Comments: L hemi, neglect Restrictions Weight Bearing Restrictions Per Provider Order: No  Therapy/Group: Individual Therapy  Atl[place] Pelto PTA  01/11/2024, 12:42 PM
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
## 2024-01-11 NOTE — Progress Notes (Signed)
 Speech Language Pathology Daily Session Note  Patient Details  Name: Dravon Nott MRN: [id number] Date of Birth: July 29, 1991  Today's Date: 01/11/2024 SLP Individual Time: 1400-1458 SLP Individual Time Calculation (min): 58 min  Short Term Goals: Week 2: SLP Short Term Goal 1 (Week 2): Patient will consume current diet with use of swallowing compensatory strategies given min multimodal A SLP Short Term Goal 2 (Week 2): Patient will utilize speech intelligibility strategies to reach 85% intelligibility at the sentence level given mod verbal A SLP Short Term Goal 3 (Week 2): Patient will demonstrate problem solving abilities during basic daily situations given mod multimodal A SLP Short Term Goal 4 (Week 2): Patient will demonstrate awareness of current deficits by verbalizing cognitive and physical changes given mod multimodal A SLP Short Term Goal 5 (Week 2): Patient will sustain attention to the left during functional tasks for 5 minutes given mod multimodal A  Skilled Therapeutic Interventions: Skilled therapy session focused on communication and cognitive goals. SLP facilitated session by reviewing SLOP speech strategies. Patient recalled 3/4 independently and remaining with min verbalA. Patient utilized strategies with mod verbal A to reach 70% intelligibility at the sentence level. SLP targeted cognitive goals through compare/contrast activity. Patient required modA to identify comparisons and minA to identify differences. SLP continued to target cognitive goals through verbal problem solving task. Patient required min A to verbalize appropriate responses. Patient left in [facility] with alarm set and call bell in reach. Continue POC  Pain None reported  Therapy/Group: Individual Therapy  Praveen Coia M.A., CCC-SLP 01/11/2024, 7:41 AM

## 2024-01-11 NOTE — Progress Notes (Signed)
 PROGRESS NOTE   Subjective/Complaints: Discussed d/c date no changes Discussed no return to work after he goes home and continued monitoring for work readiness ROS: as per HPI. Denies CP, SOB, abd pain, N/V/D/C, or any other complaints at this time.    Objective:   No results found. Recent Labs    01/08/24 0928  WBC 5.8  HGB 12.2*  HCT 37.0*  PLT 112*    No results for input(s): "NA", "K", "CL", "CO2", "GLUCOSE", "BUN", "CREATININE", "CALCIUM " in the last 72 hours.        Intake/Output Summary (Last 24 hours) at 01/11/2024 0804 Last data filed at 01/10/2024 1237 Gross per 24 hour  Intake 480 ml  Output --  Net 480 ml        Physical Exam: Vital Signs Blood pressure 107/72, pulse 75, temperature 98 F (36.7 C), resp. rate 18, height 5\' 7"  (1.702 m), weight 66.3 kg, SpO2 99%.    General: No acute distress Mood and affect are appropriate Heart: Regular rate and rhythm no rubs murmurs or extra sounds Lungs: Clear to auscultation, breathing unlabored, no rales or wheezes Abdomen: Positive bowel sounds, soft nontender to palpation, nondistended Extremities: No clubbing, cyanosis, or edema Skin: No evidence of breakdown, no evidence of rash   PRIOR EXAMS: Neuro: attention a little better, Alert and oriented x3  Left facial droop. Speech remains dysarthric LLE 3- left hip add, 3- Left hip flexion trace quad  and distally is 0/5, LUE 2-/5 -->forearm supination , finger flexion Right sided strength is 5/5. Decreased sensation throughout left side although can sense gross touch and pain. Increased DTR's LLE with ongoing sustained clonus at ankle. Heel cord tight but can be ranged to neutral.  Musc: no back pain or hip pain today   Assessment/Plan: 1. Functional deficits which require 3+ hours per day of interdisciplinary therapy in a comprehensive inpatient rehab setting. Physiatrist is providing close team  supervision and 24 hour management of active medical problems listed below. Physiatrist and rehab team continue to assess barriers to discharge/monitor patient progress toward functional and medical goals  Care Tool:  Bathing    Body parts bathed by patient: Left arm, Chest, Abdomen   Body parts bathed by helper: Right arm     Bathing assist Assist Level: Moderate Assistance - Patient 50 - 74%     Upper Body Dressing/Undressing Upper body dressing   What is the patient wearing?: Pull over shirt    Upper body assist Assist Level: Moderate Assistance - Patient 50 - 74%    Lower Body Dressing/Undressing Lower body dressing      What is the patient wearing?: Underwear/pull up, Pants     Lower body assist Assist for lower body dressing: Maximal Assistance - Patient 25 - 49% (bed level)     Toileting Toileting    Toileting assist Assist for toileting: 2 Helpers     Transfers Chair/bed transfer  Transfers assist  Chair/bed transfer activity did not occur: Safety/medical concerns  Chair/bed transfer assist level: Moderate Assistance - Patient 50 - 74%     Locomotion Ambulation   Ambulation assist   Ambulation activity did not occur: Safety/medical concerns  Assist level: Maximal Assistance - Patient 25 - 49% Assistive device:  (RHR) Max distance: 35   Walk 10 feet activity   Assist  Walk 10 feet activity did not occur: Safety/medical concerns  Assist level: Maximal Assistance - Patient 25 - 49% Assistive device:  (RHR)   Walk 50 feet activity   Assist Walk 50 feet with 2 turns activity did not occur: Safety/medical concerns         Walk 150 feet activity   Assist Walk 150 feet activity did not occur: Safety/medical concerns         Walk 10 feet on uneven surface  activity   Assist Walk 10 feet on uneven surfaces activity did not occur: Safety/medical concerns         Wheelchair     Assist Is the patient using a wheelchair?:  Yes Type of Wheelchair: Manual    Wheelchair assist level: Dependent - Patient 0% Max wheelchair distance: 150    Wheelchair 50 feet with 2 turns activity    Assist        Assist Level: Dependent - Patient 0%   Wheelchair 150 feet activity     Assist      Assist Level: Dependent - Patient 0%   Blood pressure 107/72, pulse 75, temperature 98 F (36.7 C), resp. rate 18, height 5\' 7"  (1.702 m), weight 66.3 kg, SpO2 99%.  Medical Problem List and Plan: 1. Functional deficits secondary to right basal ganglia large ICH 12/16/23             -patient may shower             -ELOS/Goals: 6/17 minA goals     2.  Antithrombotics: -DVT/anticoagulation:  Pharmaceutical: SCDs, heparin  5000U q8h             -antiplatelet therapy: N/A  3. Pain Management: Tylenol  prn. Continue gabapentin  200mg  BID -  Oxycodone  2.5mg  q6h PRN   not using    5/28 having intermittent muscular pain it appears, left hip pain better   -continue above as well as heat/ice/rom/oob  5/30 pain improved. 4. Mood/Behavior/Sleep: LCSW to follow for evaluation and support.              -antipsychotic agents: N/A  -Seroquel    Ativan  0.5mg  q6h PRN, melatonin 5mg  PRN  -5/29-   seroquel  reduced to at bedtime only -5/30 if he does well this weekend, can change seroquel  to at bedtime prn.  He already is much brighter with reduction to HS    -he is also using melatonin prn which we could schedule -01/06/24 will change seroquel  to at bedtime PRN and schedule melatonin at bedtime; pt agreeable -01/07/24 pt slept well, woke up early but not bothered by that, much more awake this morning-- leave meds as is 5. Neuropsych/cognition: This patient is not capable of making decisions on his own behalf.  6. Skin/Wound Care: Routine pressure relief measures.   7. Fluids/Electrolytes/Nutrition: Monitor I/O. Monitor routine labs. Continue vitamins/supplements.  -Dys 3 diet with thins (SLP to eval)   -Intake improved.  Does  better with food from home as well. 8. Metabolic encephalopathy/ETOH withdrawal: Has resolved, wean seroquel  and ativan  as tolerated. Continue lactulose  30g BID   5/27 ammonia level 23  9. Abnormal LFTs:  AST/ALT trending down.    -recheck Monday 6/2   10. ABLA?: Hgb 12's, monitor Hgb weekly   11. HTN: no meds PTA, started on amlodipine  10mg  daily, coreg  25mg  BID, losartan  100mg  daily,  hydralazine  PRN, labetalol  PRN. SBP goal <160 6/5 controlled   Vitals:   01/07/24 1331 01/07/24 2018 01/08/24 0650 01/08/24 1243  BP: 115/77 114/77 105/69 112/66   01/08/24 2218 01/09/24 0542 01/09/24 1404 01/09/24 2035  BP: 121/81 116/72 95/60 123/83   01/10/24 0619 01/10/24 1236 01/10/24 2017 01/11/24 0550  BP: 114/77 110/75 116/78 107/72      12. Tobacco abuse: continue nicoderm patches.   13. GI ppx: continue Protonix  40mg  daily  14. Bowel management: continue Miralax  daily  -  LBM 5/29 , is now continent  -5/30 stools liquidy---back off miralax  -01/06/24 LBM this morning, not sure what type since not documented; monitor for now -01/09/24 LBM continent  15. HLD: LDL 124, goal LDL <70, per d/c summary, start atorvastatin  40mg  once LFTs downtrend and repeat in 1 week -5/27 LFT's better yesterday -resumed atorvastatin    -LFTs cont to improved 6/2  LOS: 12 days A FACE TO FACE EVALUATION WAS PERFORMED  Genetta Kenning 01/11/2024, 8:04 AM

## 2024-01-11 NOTE — Plan of Care (Signed)
 Pt alert and oriented x4. Sitting in his wheel chair. Family member present and supportive. Pt tolerated his po meds.medicated for c/o itching and anxiety. No c/o pain. No active bleeding noted. Assisted back to bed using the [facility]. Repositioned in bed. Call light in reach. Sr x3 elevated. Padding to the rails. Bed in low position.

## 2024-01-11 NOTE — Discharge Instructions (Addendum)
 Inpatient Rehab Discharge Instructions  [facility] Discharge date and time:    Activities/Precautions/ Functional Status: Activity: no lifting, driving, or strenuous exercise  till cleared by MD Diet: cardiac diet Wound Care: none needed   Functional status:  ___ No restrictions     ___ Walk up steps independently _X__ 24/7 supervision/assistance   ___ Walk up steps with assistance ___ Intermittent supervision/assistance  ___ Bathe/dress independently ___ Walk with walker     ___ Bathe/dress with assistance ___ Walk Independently    ___ Shower independently ___ Walk with assistance    _X__ Shower with assistance _X__ No alcohol      ___ Return to work/school ________    Special Instructions:   COMMUNITY REFERRALS UPON DISCHARGE:    Outpatient: PT      OT     ST              Agency: [facility] Phone: [phone]              Appointment Date/Time: *Please expect follow-up within 7-10 business days to schedule your appointment. If you have not received follow-up, be sure to contact the site directly.*   Medical Equipment/Items Ordered:tub transfer bench and rolling walker                                                 Agency/Supplier: [employer] [id number]     My questions have been answered and I understand these instructions. I will adhere to these goals and the provided educational materials after my discharge from the hospital.  Patient/Caregiver Signature _______________________________ Date __________  Clinician Signature _______________________________________ Date __________  Please bring this form and your medication list with you to all your follow-up doctor's appointments.

## 2024-01-12 MED ORDER — POLYVINYL ALCOHOL 1.4 % OP SOLN
1.0000 [drp] | OPHTHALMIC | Status: DC | PRN
  Filled 2024-01-12: qty 15

## 2024-01-12 NOTE — Progress Notes (Signed)
 Speech Language Pathology Daily Session Note  Patient Details  Name: Troy Bishop MRN: [id number] Date of Birth: 08/10/1990  Today's Date: 01/12/2024 SLP Individual Time: 4098-1191 SLP Individual Time Calculation (min): 58 min  Short Term Goals: Week 2: SLP Short Term Goal 1 (Week 2): Patient will consume current diet with use of swallowing compensatory strategies given min multimodal A SLP Short Term Goal 2 (Week 2): Patient will utilize speech intelligibility strategies to reach 85% intelligibility at the sentence level given mod verbal A SLP Short Term Goal 3 (Week 2): Patient will demonstrate problem solving abilities during basic daily situations given mod multimodal A SLP Short Term Goal 4 (Week 2): Patient will demonstrate awareness of current deficits by verbalizing cognitive and physical changes given mod multimodal A SLP Short Term Goal 5 (Week 2): Patient will sustain attention to the left during functional tasks for 5 minutes given mod multimodal A  Skilled Therapeutic Interventions: Skilled therapy session focused on cognitive and dysarthria goals. Upon entrance, patient attempting to text wife with several spelling and grammatical errors. SLP introduced speech to text with emphasis on utilizing speech strategies to aid in clarity. Patient required modA to utilize speech to text to create appropriate text to wife and practice common phrases on notes app. SLP continued to target dysarthria and cognitive goals through review of schedule and recall of activities completed in tx. Patient recalled walking with Dom, however unable to independently recall activities completed in OT. SLP continued to target cognitive goals through re-completion of money management task. Patient with significant improvements requiring min occasional supervision A to count change according to verbalized amounts. At the end of the session, patient independently utilized speech to text on phone and provided information  about family with 85% intelligibility at the sentence level. Patient left in chair with alarm set. Continue POC  Pain None reported   Therapy/Group: Individual Therapy  Pollyanna Levay M.A., CCC-SLP 01/12/2024, 7:46 AM

## 2024-01-12 NOTE — Progress Notes (Signed)
 Occupational Therapy Session Note  Patient Details  Name: Troy Bishop MRN: [id number] Date of Birth: 12-May-1991  Today's Date: 01/12/2024 OT Individual Time: 4098-1191 OT Individual Time Calculation (min): 46 min    Short Term Goals: Week 2:  OT Short Term Goal 1 (Week 2): Pt will transfer to his L side to toilet with min A. OT Short Term Goal 2 (Week 2): Pt will be able to hold static stand during toileting with min A. OT Short Term Goal 3 (Week 2): Pt will don tshirt with min A. OT Short Term Goal 4 (Week 2): Pt will use LUE as a stabilizing A with min A,  Skilled Therapeutic Interventions/Progress Updates:   Pt greeted seated in w/c, pt agreeable to OT intervention.    Pts wife present during session.   Transfers/bed mobility/functional mobility: pt completed squat pivot transfers to R and L side with CGA, education provided to wife on set- up and technique with wife able to assist pt with transfer with good carryover of education.   NMR: pt instructed to complete targeted steps in standing with LUE then reach with RUE to L side of mirror to promote increased weightbearing into L hemibody and challenge dynamic balance. Pt with lateral lean to L side needing MIN- MODA for balance, decreased ability to swing LLE when stepping.  -pt completed seated active assist ROM task with pt instructed to roll large ball forward/backwards with a focus on scapular protraction/retraction with mirror provided for visual feedback. Pt needed MAX cues to stabilize trunk during task.  -pt instructed to lean forward onto bench to allow weightbearing to LUE to reach to floor level and to L side to collect cones to promote NMRE to L hemibody. Pt needed MAX cues to for positioning and to maintain joint stability.  -pt completed modified push ups with pt leaning anteriorly onto bench to promote NMRE to LUE.   Ended session with pt seated in w/c with all needs within reach and safety belt alarm activated.                     Therapy Documentation Precautions:  Precautions Precautions: Fall Precaution/Restrictions Comments: L hemi, neglect Restrictions Weight Bearing Restrictions Per Provider Order: No  Pain: No pain    Therapy/Group: Individual Therapy  Mollie Anger [facility] 01/12/2024, 12:16 PM

## 2024-01-12 NOTE — Progress Notes (Signed)
 Patient refused SCD's, Hand Splint and AFO

## 2024-01-12 NOTE — Progress Notes (Signed)
 PROGRESS NOTE   Subjective/Complaints: Per PT activating hamstrings, AFO working well.  Discussed post d/c f/u and STD  ROS: as per HPI. Denies CP, SOB, abd pain, N/V/D/C, or any other complaints at this time.    Objective:   No results found. No results for input(s): "WBC", "HGB", "HCT", "PLT" in the last 72 hours.   No results for input(s): "NA", "K", "CL", "CO2", "GLUCOSE", "BUN", "CREATININE", "CALCIUM " in the last 72 hours.        Intake/Output Summary (Last 24 hours) at 01/12/2024 0844 Last data filed at 01/12/2024 9147 Gross per 24 hour  Intake 1320 ml  Output 650 ml  Net 670 ml        Physical Exam: Vital Signs Blood pressure (!) 95/59, pulse 71, temperature 98 F (36.7 C), resp. rate 18, height 5\' 7"  (1.702 m), weight 60.5 kg, SpO2 100%.    General: No acute distress Mood and affect are appropriate Heart: Regular rate and rhythm no rubs murmurs or extra sounds Lungs: Clear to auscultation, breathing unlabored, no rales or wheezes Abdomen: Positive bowel sounds, soft nontender to palpation, nondistended Extremities: No clubbing, cyanosis, or edema Skin: No evidence of breakdown, no evidence of rash   PRIOR EXAMS: Neuro: attention a little better, Alert and oriented x3  Left facial droop. Speech remains dysarthric LLE 3- left hip add, 4- Left hip flexion, quad  and distally is 0/5, LUE 2-/5 -->forearm supination , finger flexion Right sided strength is 5/5. Musc: no back pain or hip pain today   Assessment/Plan: 1. Functional deficits which require 3+ hours per day of interdisciplinary therapy in a comprehensive inpatient rehab setting. Physiatrist is providing close team supervision and 24 hour management of active medical problems listed below. Physiatrist and rehab team continue to assess barriers to discharge/monitor patient progress toward functional and medical goals  Care Tool:  Bathing     Body parts bathed by patient: Left arm, Chest, Abdomen   Body parts bathed by helper: Right arm     Bathing assist Assist Level: Moderate Assistance - Patient 50 - 74%     Upper Body Dressing/Undressing Upper body dressing   What is the patient wearing?: Pull over shirt    Upper body assist Assist Level: Moderate Assistance - Patient 50 - 74%    Lower Body Dressing/Undressing Lower body dressing      What is the patient wearing?: Underwear/pull up, Pants     Lower body assist Assist for lower body dressing: Maximal Assistance - Patient 25 - 49% (bed level)     Toileting Toileting    Toileting assist Assist for toileting: 2 Helpers     Transfers Chair/bed transfer  Transfers assist  Chair/bed transfer activity did not occur: Safety/medical concerns  Chair/bed transfer assist level: Minimal Assistance - Patient > 75% (SPT)     Locomotion Ambulation   Ambulation assist   Ambulation activity did not occur: Safety/medical concerns  Assist level: Maximal Assistance - Patient 25 - 49% Assistive device:  (RHR) Max distance: 35   Walk 10 feet activity   Assist  Walk 10 feet activity did not occur: Safety/medical concerns  Assist level: Maximal Assistance - Patient  25 - 49% Assistive device:  (RHR)   Walk 50 feet activity   Assist Walk 50 feet with 2 turns activity did not occur: Safety/medical concerns         Walk 150 feet activity   Assist Walk 150 feet activity did not occur: Safety/medical concerns         Walk 10 feet on uneven surface  activity   Assist Walk 10 feet on uneven surfaces activity did not occur: Safety/medical concerns         Wheelchair     Assist Is the patient using a wheelchair?: Yes Type of Wheelchair: Manual    Wheelchair assist level: Dependent - Patient 0% Max wheelchair distance: 150    Wheelchair 50 feet with 2 turns activity    Assist        Assist Level: Dependent - Patient 0%    Wheelchair 150 feet activity     Assist      Assist Level: Dependent - Patient 0%   Blood pressure (!) 95/59, pulse 71, temperature 98 F (36.7 C), resp. rate 18, height 5\' 7"  (1.702 m), weight 60.5 kg, SpO2 100%.  Medical Problem List and Plan: 1. Functional deficits secondary to right basal ganglia large ICH 12/16/23             -patient may shower             -ELOS/Goals: 6/17 minA goals     2.  Antithrombotics: -DVT/anticoagulation:  Pharmaceutical: SCDs, heparin  5000U q8h             -antiplatelet therapy: N/A  3. Pain Management: Tylenol  prn. Continue gabapentin  200mg  BID -  Oxycodone  2.5mg  q6h PRN   not using    5/28 having intermittent muscular pain it appears, left hip pain better   -continue above as well as heat/ice/rom/oob  5/30 pain improved. 4. Mood/Behavior/Sleep: LCSW to follow for evaluation and support.              -antipsychotic agents: N/A  -Seroquel    Ativan  0.5mg  q6h PRN, melatonin 5mg  PRN  -5/29-   seroquel  reduced to at bedtime only -5/30 if he does well this weekend, can change seroquel  to at bedtime prn.  He already is much brighter with reduction to HS    -he is also using melatonin prn which we could schedule -01/06/24 will change seroquel  to at bedtime PRN and schedule melatonin at bedtime; pt agreeable -01/07/24 pt slept well, woke up early but not bothered by that, much more awake this morning-- leave meds as is 5. Neuropsych/cognition: This patient is not capable of making decisions on his own behalf.  6. Skin/Wound Care: Routine pressure relief measures.   7. Fluids/Electrolytes/Nutrition: Monitor I/O. Monitor routine labs. Continue vitamins/supplements.  -Dys 3 diet with thins (SLP to eval)   -Intake improved.  Does better with food from home as well. 8. Metabolic encephalopathy/ETOH withdrawal: Has resolved, wean seroquel  and ativan  as tolerated. Continue lactulose  30g BID   5/27 ammonia level 23  9. Abnormal LFTs:  AST/ALT trending  down.    -recheck Monday 6/2   10. ABLA?: Hgb 12's, monitor Hgb weekly   11. HTN: no meds PTA, started on amlodipine  10mg  daily, coreg  25mg  BID, losartan  100mg  daily, hydralazine  PRN, labetalol  PRN. SBP goal <160 6/6 controlled   Vitals:   01/08/24 1243 01/08/24 2218 01/09/24 0542 01/09/24 1404  BP: 112/66 121/81 116/72 95/60   01/09/24 2035 01/10/24 0619 01/10/24 1236 01/10/24 2017  BP: 123/83  114/77 110/75 116/78   01/11/24 0550 01/11/24 1300 01/11/24 1952 01/12/24 0457  BP: 107/72 108/67 101/76 (!) 95/59      12. Tobacco abuse: continue nicoderm patches.   13. GI ppx: continue Protonix  40mg  daily  14. Bowel management: continue Miralax  daily  -  LBM 5/29 , is now continent  -5/30 stools liquidy---back off miralax  -01/06/24 LBM this morning, not sure what type since not documented; monitor for now -01/09/24 LBM continent  15. HLD: LDL 124, goal LDL <70, per d/c summary, start atorvastatin  40mg  once LFTs downtrend and repeat in 1 week -5/27 LFT's better yesterday -resumed atorvastatin    -LFTs cont to improved 6/2  LOS: 13 days A FACE TO FACE EVALUATION WAS PERFORMED  Troy Bishop 01/12/2024, 8:44 AM

## 2024-01-12 NOTE — Plan of Care (Signed)
  Problem: Consults Goal: RH STROKE PATIENT EDUCATION Description: See Patient Education module for education specifics  Outcome: Progressing   Problem: RH BOWEL ELIMINATION Goal: RH STG MANAGE BOWEL WITH ASSISTANCE Description: STG Manage Bowel with  supervision Assistance. Outcome: Progressing   Problem: RH BLADDER ELIMINATION Goal: RH STG MANAGE BLADDER WITH ASSISTANCE Description: STG Manage Bladder With supervision Assistance Outcome: Progressing   Problem: RH SKIN INTEGRITY Goal: RH STG SKIN FREE OF INFECTION/BREAKDOWN Description: Manage skin free of infection/breakdown with supervision assistance Outcome: Progressing   Problem: RH SAFETY Goal: RH STG ADHERE TO SAFETY PRECAUTIONS W/ASSISTANCE/DEVICE Description: STG Adhere to Safety Precautions With supervision Assistance/Device. Outcome: Progressing

## 2024-01-12 NOTE — Progress Notes (Signed)
 Physical Therapy Session Note  Patient Details  Name: Troy Bishop MRN: [id number] Date of Birth: Aug 11, 1990  Today's Date: 01/12/2024 PT Individual Time: 4098-1191 PT Individual Time Calculation (min): 40 min   Short Term Goals: Week 1:  PT Short Term Goal 1 (Week 1): Pt will transfer sup to sit w/ mod A consistently. PT Short Term Goal 1 - Progress (Week 1): Met PT Short Term Goal 2 (Week 1): Pt will maintain seated balance x 5' w/ CGA PT Short Term Goal 2 - Progress (Week 1): Met PT Short Term Goal 3 (Week 1): Pt will transfer sit to stand w/ mod A + 1 PT Short Term Goal 3 - Progress (Week 1): Met PT Short Term Goal 4 (Week 1): PT to assess gait. PT Short Term Goal 4 - Progress (Week 1): Met  Skilled Therapeutic Interventions/Progress Updates: Patient supine in bed with wife present on entrance to room. Patient alert and agreeable to PT session.   Patient reported unrated pain in L HS (activity provided noted bellow with pt stating decrease in pain at end of session). Pt continues to demonstrate impulsivity, although much less than at admission.   Therapeutic Activity: Bed Mobility: Pt performed supine<sit on EOB with minA when following cuing for side roll sequence and R UE support to assist with truncal elevation. Transfers: Pt performed sit<>stand pivot transfer from EOB<WC with maxA and cues to scoot towards WC and ensure B feet placed flat on floor.  Neuromuscular Re-ed: NMR facilitated during session with focus on neuromuscular connection/coordination, standing balance, proprioceptive feedback. L GRAFO donned - R UE supported on hand rail. PTA on L blocking L knee and providing tactile stimulation to quadriceps with cues for pt to squeeze to stand on L LE. Pt noted to have increased quad engagement in standing this session.  - Sitting in [facility] with cues for pt to perform knee flexion L LE with noted contraction of flexors (PTA providing minimal resistance). Pt noted to have tight  flexors throughout  - pt performed above to prime L LE prior to ambulation - Pt ambulated roughly 40' in hallway with B UE supported and WC follow and cues for pt to squeeze quadriceps during stance phase (required mod/maxA, then able to perform with minA on occasion). Pt ambulated with increased time this session to purely focus on quadriceps activation during stance phase. Pt still overall modA + 2 (maxA). Pt required increased time on occasion to motor plan advancement of LE called out by PTA throughout (attempts to move R LE vs L or vice versa).   NMR performed for improvements in motor control and coordination, balance, sequencing, judgement, and self confidence/ efficacy in performing all aspects of mobility at highest level of independence.   Patient sitting in WC at end of session with brakes locked, wife present, bed alarm set, and all needs within reach.      Therapy Documentation Precautions:  Precautions Precautions: Fall Precaution/Restrictions Comments: L hemi, neglect Restrictions Weight Bearing Restrictions Per Provider Order: No   Therapy/Group: Individual Therapy  Adelisa Satterwhite PTA 01/12/2024, 12:56 PM

## 2024-01-12 NOTE — Progress Notes (Signed)
 Occupational Therapy Session Note  Patient Details  Name: Troy Bishop MRN: [id number] Date of Birth: 1991-05-28  Today's Date: 01/12/2024 OT Individual Time: 1100-1200 OT Individual Time Calculation (min): 60 min    Short Term Goals: Week 2:  OT Short Term Goal 1 (Week 2): Pt will transfer to his L side to toilet with min A. OT Short Term Goal 2 (Week 2): Pt will be able to hold static stand during toileting with min A. OT Short Term Goal 3 (Week 2): Pt will don tshirt with min A. OT Short Term Goal 4 (Week 2): Pt will use LUE as a stabilizing A with min A,  Skilled Therapeutic Interventions/Progress Updates:    Pt received in w/c with wife in room. She came to therapy to participate in transfers.  In gym, had pt practice scooting forward in wc seat and completing squat pivot transfers w/c to mat 8x for reinforcement of forward lean to lift hips. Pt able to do so with CGA, had wife practice transfer 3x.  Pt able to follow through but at end of session when told to return to w/c he forgot the transfer method and tried to hop up and "toss" himself toward the seat. Had him re-practice squat pivot. Wife understands why squat vs stand is a safer transfer due to L leg hemiplegia.  Sat on mat to work on LUE a/arom/NMR with grasp and reaching, trunk rotation and L hand wt bearing. Integrated estim for finger ext and tricep ext to start movement of reaching.    Pt continues to have poor attention span and carryover.  He is very concerned about return of LUE but also has inattention to L hand even with focused tasks.   Pt returned to room with wife.   Therapy Documentation Precautions:  Precautions Precautions: Fall Precaution/Restrictions Comments: L hemi, neglect Restrictions Weight Bearing Restrictions Per Provider Order: No Pain: Pain Assessment Pain Score: 0-No pain       Therapy/Group: Individual Therapy  Brizeyda Holtmeyer 01/12/2024, 1:11 PM

## 2024-01-13 LAB — GLUCOSE, CAPILLARY: Glucose-Capillary: 117 mg/dL — ABNORMAL HIGH (ref 70–99)

## 2024-01-13 NOTE — Progress Notes (Signed)
 Patient refuses to wear hand splint,, AFO and educated regarding purposes of orders

## 2024-01-13 NOTE — Progress Notes (Signed)
 PROGRESS NOTE   Subjective/Complaints:  Pt doing well, slept well, denies pain, LBM yesterday, urinating ok. No other complaints or concerns.   ROS: as per HPI. Denies CP, SOB, abd pain, N/V/D/C, or any other complaints at this time.    Objective:   No results found. No results for input(s): "WBC", "HGB", "HCT", "PLT" in the last 72 hours.   No results for input(s): "NA", "K", "CL", "CO2", "GLUCOSE", "BUN", "CREATININE", "CALCIUM " in the last 72 hours.        Intake/Output Summary (Last 24 hours) at 01/13/2024 1308 Last data filed at 01/12/2024 1819 Gross per 24 hour  Intake 0 ml  Output --  Net 0 ml        Physical Exam: Vital Signs Blood pressure 122/84, pulse 76, temperature 97.6 F (36.4 C), temperature source Oral, resp. rate 18, height 5\' 7"  (1.702 m), weight 60.5 kg, SpO2 100%.    General: No acute distress, sitting up in bed.  Mood and affect are appropriate, brighter Heart: Regular rate and rhythm no rubs murmurs or extra sounds Lungs: Clear to auscultation, breathing unlabored, no rales or wheezes Abdomen: Positive bowel sounds, soft nontender to palpation, nondistended Extremities: No clubbing, cyanosis, or edema Skin: No evidence of breakdown, no evidence of rash Neuro: dysarthric, L facial droop, L sided weakness.    PRIOR EXAMS: Neuro: attention a little better, Alert and oriented x3  Left facial droop. Speech remains dysarthric LLE 3- left hip add, 4- Left hip flexion, quad  and distally is 0/5, LUE 2-/5 -->forearm supination , finger flexion Right sided strength is 5/5. Musc: no back pain or hip pain today   Assessment/Plan: 1. Functional deficits which require 3+ hours per day of interdisciplinary therapy in a comprehensive inpatient rehab setting. Physiatrist is providing close team supervision and 24 hour management of active medical problems listed below. Physiatrist and rehab team  continue to assess barriers to discharge/monitor patient progress toward functional and medical goals  Care Tool:  Bathing    Body parts bathed by patient: Left arm, Chest, Abdomen   Body parts bathed by helper: Right arm     Bathing assist Assist Level: Moderate Assistance - Patient 50 - 74%     Upper Body Dressing/Undressing Upper body dressing   What is the patient wearing?: Pull over shirt    Upper body assist Assist Level: Moderate Assistance - Patient 50 - 74%    Lower Body Dressing/Undressing Lower body dressing      What is the patient wearing?: Underwear/pull up, Pants     Lower body assist Assist for lower body dressing: Maximal Assistance - Patient 25 - 49% (bed level)     Toileting Toileting    Toileting assist Assist for toileting: 2 Helpers     Transfers Chair/bed transfer  Transfers assist  Chair/bed transfer activity did not occur: Safety/medical concerns  Chair/bed transfer assist level: Minimal Assistance - Patient > 75% (SPT)     Locomotion Ambulation   Ambulation assist   Ambulation activity did not occur: Safety/medical concerns  Assist level: Maximal Assistance - Patient 25 - 49% Assistive device:  (RHR) Max distance: 35   Walk 10 feet activity  Assist  Walk 10 feet activity did not occur: Safety/medical concerns  Assist level: Maximal Assistance - Patient 25 - 49% Assistive device:  (RHR)   Walk 50 feet activity   Assist Walk 50 feet with 2 turns activity did not occur: Safety/medical concerns         Walk 150 feet activity   Assist Walk 150 feet activity did not occur: Safety/medical concerns         Walk 10 feet on uneven surface  activity   Assist Walk 10 feet on uneven surfaces activity did not occur: Safety/medical concerns         Wheelchair     Assist Is the patient using a wheelchair?: Yes Type of Wheelchair: Manual    Wheelchair assist level: Dependent - Patient 0% Max wheelchair  distance: 150    Wheelchair 50 feet with 2 turns activity    Assist        Assist Level: Dependent - Patient 0%   Wheelchair 150 feet activity     Assist      Assist Level: Dependent - Patient 0%   Blood pressure 122/84, pulse 76, temperature 97.6 F (36.4 C), temperature source Oral, resp. rate 18, height 5\' 7"  (1.702 m), weight 60.5 kg, SpO2 100%.  Medical Problem List and Plan: 1. Functional deficits secondary to right basal ganglia large ICH 12/16/23             -patient may shower             -ELOS/Goals: 6/17 minA goals  -Continue CIR   2.  Antithrombotics: -DVT/anticoagulation:  Pharmaceutical: SCDs, heparin  5000U q8h             -antiplatelet therapy: N/A  3. Pain Management: Tylenol  prn. Continue gabapentin  200mg  BID -  Oxycodone  2.5mg  q6h PRN   not using    5/28 having intermittent muscular pain it appears, left hip pain better   -continue above as well as heat/ice/rom/oob  5/30 pain improved. 4. Mood/Behavior/Sleep: LCSW to follow for evaluation and support.              -antipsychotic agents: N/A  -Seroquel    Ativan  0.5mg  q6h PRN, melatonin 5mg  PRN  -5/29-   seroquel  reduced to at bedtime only -5/30 if he does well this weekend, can change seroquel  to at bedtime prn.  He already is much brighter with reduction to HS    -he is also using melatonin prn which we could schedule -01/06/24 will change seroquel  to at bedtime PRN and schedule melatonin at bedtime; pt agreeable -01/07/24 pt slept well, woke up early but not bothered by that, much more awake this morning-- leave meds as is 5. Neuropsych/cognition: This patient is not capable of making decisions on his own behalf.  6. Skin/Wound Care: Routine pressure relief measures.   7. Fluids/Electrolytes/Nutrition: Monitor I/O. Monitor routine labs. Continue vitamins/supplements.  -Dys 3 diet with thins (SLP to eval)   -Intake improved.  Does better with food from home as well. 8. Metabolic  encephalopathy/ETOH withdrawal: Has resolved, wean seroquel  and ativan  as tolerated. Continue lactulose  30g BID   5/27 ammonia level 23  9. Abnormal LFTs:  AST/ALT trending down.    -recheck Monday 6/2   10. ABLA?: Hgb 12's, monitor Hgb weekly   11. HTN: no meds PTA, started on amlodipine  10mg  daily, coreg  25mg  BID, losartan  100mg  daily. SBP goal <160 6/7 controlled   Vitals:   01/09/24 2035 01/10/24 0619 01/10/24 1236 01/10/24 2017  BP:  123/83 114/77 110/75 116/78   01/11/24 0550 01/11/24 1300 01/11/24 1952 01/12/24 0457  BP: 107/72 108/67 101/76 (!) 95/59   01/12/24 1407 01/12/24 2033 01/13/24 0623 01/13/24 1302  BP: 106/66 115/80 120/78 122/84      12. Tobacco abuse: continue nicoderm patches.   13. GI ppx: continue Protonix  40mg  daily  14. Bowel management: continue Miralax  daily  -  LBM 5/29 , is now continent  -5/30 stools liquidy---back off miralax  -01/06/24 LBM this morning, not sure what type since not documented; monitor for now -01/13/24 LBM yesterday, monitor  15. HLD: LDL 124, goal LDL <70, per d/c summary, start atorvastatin  40mg  once LFTs downtrend and repeat in 1 week -5/27 LFT's better yesterday -resumed atorvastatin    -LFTs cont to improved 6/2  LOS: 14 days A FACE TO FACE EVALUATION WAS PERFORMED  [address] 01/13/2024, 1:08 PM

## 2024-01-14 NOTE — Plan of Care (Signed)
  Problem: Consults Goal: RH STROKE PATIENT EDUCATION Description: See Patient Education module for education specifics  Outcome: Progressing   Problem: RH BOWEL ELIMINATION Goal: RH STG MANAGE BOWEL WITH ASSISTANCE Description: STG Manage Bowel with  supervision Assistance. Outcome: Progressing   Problem: RH BLADDER ELIMINATION Goal: RH STG MANAGE BLADDER WITH ASSISTANCE Description: STG Manage Bladder With supervision Assistance Outcome: Progressing   Problem: RH SKIN INTEGRITY Goal: RH STG SKIN FREE OF INFECTION/BREAKDOWN Description: Manage skin free of infection/breakdown with supervision assistance Outcome: Progressing

## 2024-01-14 NOTE — Progress Notes (Signed)
 Occupational Therapy Session Note  Patient Details  Name: Troy Bishop MRN: [id number] Date of Birth: 27-Jun-1991  Today's Date: 01/14/2024 OT Individual Time: 1500-1530 OT Individual Time Calculation (min): 30 min    Short Term Goals: Week 2:  OT Short Term Goal 1 (Week 2): Pt will transfer to his L side to toilet with min A. OT Short Term Goal 2 (Week 2): Pt will be able to hold static stand during toileting with min A. OT Short Term Goal 3 (Week 2): Pt will don tshirt with min A. OT Short Term Goal 4 (Week 2): Pt will use LUE as a stabilizing A with min A, Week 3:     Skilled Therapeutic Interventions/Progress Updates:    1:1 Pt received in the w/c with wife present. Practiced donning shoe including left shoe with AFO with max cues for problem solving and instruction. Pt propelled w/c from room the dayroom with max VC and min A for attention to the left and mechanics for using right UE and LE to steer. Pt transferred to the mat with min A. On the mat focus on functional use of left UE in all planes in supine, sidelying and in sitting with focus on moving through the ROM with support against gravity. Pt worked on terminal elbow extension. Pt with active movement in all joints of UE and in scapular region.  PNF performed with min assist against gravity in supine. Pt ambulated back to his room with 3 musketeers method with max cues for sustained mm contraction in left quad before stepping with right LE. Pt with good control only needing verbal cues and multimodal cues for upright posture and visual attention ahead of him.   PT left with wife in w/c to wash his hair at the sink.  Therapy Documentation Precautions:  Precautions Precautions: Fall Precaution/Restrictions Comments: L hemi, neglect Restrictions Weight Bearing Restrictions Per Provider Order: No  Pain:  No c/o pain in session  Therapy/Group: Individual Therapy  Henrene Locust [facility] 01/14/2024, 4:05 PM

## 2024-01-14 NOTE — Progress Notes (Signed)
 Physical Therapy Session Note  Patient Details  Name: Troy Bishop MRN: [id number] Date of Birth: 04-17-1991  Today's Date: 01/14/2024 PT Individual Time: 4098-1191 PT Individual Time Calculation (min): 58 min   Short Term Goals: Week 2:  PT Short Term Goal 1 (Week 2): Pt will maintain static stand balance w/ Mod A to maintain L knee extension PT Short Term Goal 2 (Week 2): Pt will perform stand pivot transfer w/ mod A w/ LRAD PT Short Term Goal 3 (Week 2): Pt will initiate w/c mobility PT Short Term Goal 4 (Week 2): PT to initiate AFO consult.  Skilled Therapeutic Interventions/Progress Updates: Pt presents sitting in recliner and agreeable to therapy.  Pt transfers sit to stand w/ min A and blocking L knee.  Pt then amb to w/c w/ PT under L arm and cueing for sequencing, assist for LLE placement and blocking l knee, mostly 2/2 confined spaces and turning.  Pt still impulsive so directions have to be quick.  Pt wheeled to main hallway for use of hand rail.  Pt educated on transferring using arm rest vs hand rail 2/2 availability.  Pt amb x 30' along rail w/ list to L and posteriorly and mod A.  Pt performed 3 more trials w/ improved performance, placement of L foot, and posture.  Pt performed standing w/o UE support, cues for weight shift side to side.  Pt can being to joke "hula" and throws self off balance, but is able to stand w/ facilitation at quads.  Pt performed partial squats 3 x 5 w/ noted activation of quads.  Pt returned to room and stepped to recliner w/ "2 musketeers" technique w/ improved strength noted.  Chair alarm on, LES elevated and all needs in reach, spouse present throughout session.     Therapy Documentation Precautions:  Precautions Precautions: Fall Precaution/Restrictions Comments: L hemi, neglect Restrictions Weight Bearing Restrictions Per Provider Order: No General:   Vital Signs:   Pain:0/10    Therapy/Group: Individual Therapy  [name] P Cola Gane 01/14/2024,  9:49 AM

## 2024-01-14 NOTE — Progress Notes (Signed)
 PROGRESS NOTE   Subjective/Complaints:  Pt doing well again, slept well, denies pain, LBM yesterday, urinating ok. No other complaints or concerns.   ROS: as per HPI. Denies CP, SOB, abd pain, N/V/D/C, or any other complaints at this time.    Objective:   No results found. No results for input(s): "WBC", "HGB", "HCT", "PLT" in the last 72 hours.   No results for input(s): "NA", "K", "CL", "CO2", "GLUCOSE", "BUN", "CREATININE", "CALCIUM " in the last 72 hours.        Intake/Output Summary (Last 24 hours) at 01/14/2024 1154 Last data filed at 01/14/2024 0700 Gross per 24 hour  Intake 237 ml  Output --  Net 237 ml         Physical Exam: Vital Signs Blood pressure 104/68, pulse 77, temperature 97.9 F (36.6 C), resp. rate 18, height 5\' 7"  (1.702 m), weight 60.5 kg, SpO2 99%.    General: No acute distress, sitting up in bedside chair taking his meds. .  Mood and affect are appropriate, brighter Heart: Regular rate and rhythm no rubs murmurs or extra sounds Lungs: Clear to auscultation, breathing unlabored, no rales or wheezes Abdomen: Positive bowel sounds, soft nontender to palpation, nondistended Extremities: No clubbing, cyanosis, or edema Skin: No evidence of breakdown, no evidence of rash Neuro: dysarthric, L facial droop, L sided weakness.    PRIOR EXAMS: Neuro: attention a little better, Alert and oriented x3  Left facial droop. Speech remains dysarthric LLE 3- left hip add, 4- Left hip flexion, quad  and distally is 0/5, LUE 2-/5 -->forearm supination , finger flexion Right sided strength is 5/5. Musc: no back pain or hip pain today   Assessment/Plan: 1. Functional deficits which require 3+ hours per day of interdisciplinary therapy in a comprehensive inpatient rehab setting. Physiatrist is providing close team supervision and 24 hour management of active medical problems listed below. Physiatrist and  rehab team continue to assess barriers to discharge/monitor patient progress toward functional and medical goals  Care Tool:  Bathing    Body parts bathed by patient: Left arm, Chest, Abdomen   Body parts bathed by helper: Right arm     Bathing assist Assist Level: Moderate Assistance - Patient 50 - 74%     Upper Body Dressing/Undressing Upper body dressing   What is the patient wearing?: Pull over shirt    Upper body assist Assist Level: Moderate Assistance - Patient 50 - 74%    Lower Body Dressing/Undressing Lower body dressing      What is the patient wearing?: Underwear/pull up, Pants     Lower body assist Assist for lower body dressing: Maximal Assistance - Patient 25 - 49% (bed level)     Toileting Toileting    Toileting assist Assist for toileting: 2 Helpers     Transfers Chair/bed transfer  Transfers assist  Chair/bed transfer activity did not occur: Safety/medical concerns  Chair/bed transfer assist level: Minimal Assistance - Patient > 75% (SPT)     Locomotion Ambulation   Ambulation assist   Ambulation activity did not occur: Safety/medical concerns  Assist level: Moderate Assistance - Patient 50 - 74% Assistive device: Other (comment) (right hand rail) Max distance:  30   Walk 10 feet activity   Assist  Walk 10 feet activity did not occur: Safety/medical concerns  Assist level: Moderate Assistance - Patient - 50 - 74% Assistive device:  (right hand rail)   Walk 50 feet activity   Assist Walk 50 feet with 2 turns activity did not occur: Safety/medical concerns         Walk 150 feet activity   Assist Walk 150 feet activity did not occur: Safety/medical concerns         Walk 10 feet on uneven surface  activity   Assist Walk 10 feet on uneven surfaces activity did not occur: Safety/medical concerns         Wheelchair     Assist Is the patient using a wheelchair?: Yes Type of Wheelchair: Manual    Wheelchair  assist level: Moderate Assistance - Patient 50 - 74% Max wheelchair distance: 150    Wheelchair 50 feet with 2 turns activity    Assist        Assist Level: Moderate Assistance - Patient 50 - 74%   Wheelchair 150 feet activity     Assist      Assist Level: Moderate Assistance - Patient 50 - 74%   Blood pressure 104/68, pulse 77, temperature 97.9 F (36.6 C), resp. rate 18, height 5\' 7"  (1.702 m), weight 60.5 kg, SpO2 99%.  Medical Problem List and Plan: 1. Functional deficits secondary to right basal ganglia large ICH 12/16/23             -patient may shower             -ELOS/Goals: 6/17 minA goals  -Continue CIR   2.  Antithrombotics: -DVT/anticoagulation:  Pharmaceutical: SCDs, heparin  5000U q8h             -antiplatelet therapy: N/A  3. Pain Management: Tylenol  prn. Continue gabapentin  200mg  BID -  Oxycodone  2.5mg  q6h PRN   not using    5/28 having intermittent muscular pain it appears, left hip pain better   -continue above as well as heat/ice/rom/oob  5/30 pain improved. 4. Mood/Behavior/Sleep: LCSW to follow for evaluation and support.              -antipsychotic agents: N/A  -Seroquel    Ativan  0.5mg  q6h PRN, melatonin 5mg  PRN  -5/29-   seroquel  reduced to at bedtime only -5/30 if he does well this weekend, can change seroquel  to at bedtime prn.  He already is much brighter with reduction to HS    -he is also using melatonin prn which we could schedule -01/06/24 will change seroquel  to at bedtime PRN and schedule melatonin at bedtime; pt agreeable -01/07/24 pt slept well, woke up early but not bothered by that, much more awake this morning-- leave meds as is 5. Neuropsych/cognition: This patient is not capable of making decisions on his own behalf.  6. Skin/Wound Care: Routine pressure relief measures.   7. Fluids/Electrolytes/Nutrition: Monitor I/O. Monitor routine labs. Continue vitamins/supplements.  -Dys 3 diet with thins (SLP to eval)   -Intake  improved.  Does better with food from home as well. 8. Metabolic encephalopathy/ETOH withdrawal: Has resolved, wean seroquel  and ativan  as tolerated. Continue lactulose  30g BID   5/27 ammonia level 23  9. Abnormal LFTs:  AST/ALT trending down.    -recheck Monday 6/9   10. ABLA?: Hgb 12's, monitor Hgb weekly   11. HTN: no meds PTA, started on amlodipine  10mg  daily, coreg  25mg  BID, losartan  100mg  daily. SBP goal <  160 6/8 controlled   Vitals:   01/10/24 1236 01/10/24 2017 01/11/24 0550 01/11/24 1300  BP: 110/75 116/78 107/72 108/67   01/11/24 1952 01/12/24 0457 01/12/24 1407 01/12/24 2033  BP: 101/76 (!) 95/59 106/66 115/80   01/13/24 0623 01/13/24 1302 01/13/24 1943 01/14/24 0540  BP: 120/78 122/84 115/66 104/68      12. Tobacco abuse: continue nicoderm patches.   13. GI ppx: continue Protonix  40mg  daily  14. Bowel management: continue Miralax  daily  -  LBM 5/29 , is now continent  -5/30 stools liquidy---back off miralax  -01/06/24 LBM this morning, not sure what type since not documented; monitor for now -01/14/24 LBM yesterday, monitor  15. HLD: LDL 124, goal LDL <70, per d/c summary, start atorvastatin  40mg  once LFTs downtrend and repeat in 1 week -5/27 LFT's better yesterday -resumed atorvastatin    -LFTs cont to improved 6/2  LOS: 15 days A FACE TO FACE EVALUATION WAS PERFORMED  [address] 01/14/2024, 11:54 AM

## 2024-01-15 DIAGNOSIS — R7989 Other specified abnormal findings of blood chemistry: Secondary | ICD-10-CM

## 2024-01-15 DIAGNOSIS — K5900 Constipation, unspecified: Secondary | ICD-10-CM

## 2024-01-15 DIAGNOSIS — E876 Hypokalemia: Secondary | ICD-10-CM

## 2024-01-15 LAB — CBC WITH DIFFERENTIAL/PLATELET
Abs Immature Granulocytes: 0.05 10*3/uL (ref 0.00–0.07)
Basophils Absolute: 0 10*3/uL (ref 0.0–0.1)
Basophils Relative: 1 %
Eosinophils Absolute: 0.5 10*3/uL (ref 0.0–0.5)
Eosinophils Relative: 9 %
HCT: 36.3 % — ABNORMAL LOW (ref 39.0–52.0)
Hemoglobin: 12 g/dL — ABNORMAL LOW (ref 13.0–17.0)
Immature Granulocytes: 1 %
Lymphocytes Relative: 34 %
Lymphs Abs: 2 10*3/uL (ref 0.7–4.0)
MCH: 30.9 pg (ref 26.0–34.0)
MCHC: 33.1 g/dL (ref 30.0–36.0)
MCV: 93.6 fL (ref 80.0–100.0)
Monocytes Absolute: 0.6 10*3/uL (ref 0.1–1.0)
Monocytes Relative: 10 %
Neutro Abs: 2.7 10*3/uL (ref 1.7–7.7)
Neutrophils Relative %: 45 %
Platelets: 93 10*3/uL — ABNORMAL LOW (ref 150–400)
RBC: 3.88 MIL/uL — ABNORMAL LOW (ref 4.22–5.81)
RDW: 12.9 % (ref 11.5–15.5)
WBC: 5.8 10*3/uL (ref 4.0–10.5)
nRBC: 0 % (ref 0.0–0.2)

## 2024-01-15 LAB — COMPREHENSIVE METABOLIC PANEL WITH GFR
ALT: 79 U/L — ABNORMAL HIGH (ref 0–44)
AST: 37 U/L (ref 15–41)
Albumin: 3.6 g/dL (ref 3.5–5.0)
Alkaline Phosphatase: 81 U/L (ref 38–126)
Anion gap: 9 (ref 5–15)
BUN: 12 mg/dL (ref 6–20)
CO2: 24 mmol/L (ref 22–32)
Calcium: 9.5 mg/dL (ref 8.9–10.3)
Chloride: 106 mmol/L (ref 98–111)
Creatinine, Ser: 0.69 mg/dL (ref 0.61–1.24)
GFR, Estimated: >60 mL/min (ref >60)
Glucose, Bld: 85 mg/dL (ref 70–99)
Potassium: 3.4 mmol/L — ABNORMAL LOW (ref 3.5–5.1)
Sodium: 139 mmol/L (ref 135–145)
Total Bilirubin: 0.8 mg/dL (ref 0.0–1.2)
Total Protein: 7.8 g/dL (ref 6.5–8.1)

## 2024-01-15 MED ORDER — POTASSIUM CHLORIDE CRYS ER 20 MEQ PO TBCR
30.0000 meq | EXTENDED_RELEASE_TABLET | Freq: Once | ORAL | Status: AC
  Administered 2024-01-15: 30 meq via ORAL
  Filled 2024-01-15: qty 1

## 2024-01-15 NOTE — Progress Notes (Signed)
 Speech Language Pathology Weekly Progress and Session Note  Patient Details  Name: Troy Bishop MRN: [id number] Date of Birth: 1991-01-29  Beginning of progress report period: January 08, 2024 End of progress report period: January 15, 2024  Today's Date: 01/15/2024 SLP Individual Time: 1300-1400 SLP Individual Time Calculation (min): 60 min  Short Term Goals: Week 2: SLP Short Term Goal 1 (Week 2): Patient will consume current diet with use of swallowing compensatory strategies given min multimodal A SLP Short Term Goal 1 - Progress (Week 2): Met SLP Short Term Goal 2 (Week 2): Patient will utilize speech intelligibility strategies to reach 85% intelligibility at the sentence level given mod verbal A SLP Short Term Goal 2 - Progress (Week 2): Met SLP Short Term Goal 3 (Week 2): Patient will demonstrate problem solving abilities during basic daily situations given mod multimodal A SLP Short Term Goal 3 - Progress (Week 2): Met SLP Short Term Goal 4 (Week 2): Patient will demonstrate awareness of current deficits by verbalizing cognitive and physical changes given mod multimodal A SLP Short Term Goal 4 - Progress (Week 2): Not met SLP Short Term Goal 5 (Week 2): Patient will sustain attention to the left during functional tasks for 5 minutes given mod multimodal A SLP Short Term Goal 5 - Progress (Week 2): Met    New Short Term Goals: Week 3: SLP Short Term Goal 1 (Week 3): STG = LTG due to elos  Weekly Progress Updates: Pt has made great gains and has met 4 of 5 STGs this reporting period due to improved dysphagia, dysarthria and cognition. Currently, patient continues to require mod A for cognitive tasks including problem solving and L attention. Patient did not meet awareness goal due to need for continued education regarding cognitive changes. Patient is currently 85% intelligible at the sentence level given mod verbal A and was upgraded to regular/thin diet this date. Pt/family eduction ongoing.  Pt would benefit from continued ST intervention to maximize dysphagia, dysarthria and cognition in order to maximize functional independence at d/c.    Intensity: Minumum of 1-2 x/day, 30 to 90 minutes Frequency: 3 to 5 out of 7 days Duration/Length of Stay: 6/17 Treatment/Interventions: Therapeutic Activities;Therapeutic Exercise;Internal/external aids;Dysphagia/aspiration precaution training;Patient/family education;Cognitive remediation/compensation;Environmental controls;Cueing hierarchy;Functional tasks   Daily Session  Skilled Therapeutic Interventions:  Skilled therapy session focused on cognitive, dysphagia and dysarthria goals. SLP targeted cognitive goals through awareness and L attention task. Patient continues to independently recall physical deficits, however with difficulty acknowledging cognitive changes. Patient with awareness of speech changes this date. SLP targeted L attention through providing min-modA for patient to identify specified letters and objects. SLP targeted dysphagia goals through observing patient during consumption of regular/thin snack. Patient with timely mastication and complete oral clearance given supervisionA. No s/sx of aspiration observed. Recommend upgrade to regular/thin with supervision A. SLP targeted dysarthria goals at the sentence level. Patient recalled strategies with minA and reached 85% intelligibility with min-modA for use. Patient left in [facility] with alarm set and call bell in reach. Continue POC     Pain None reported   Therapy/Group: Individual Therapy  Maxine Huynh M.A., CCC-SLP 01/15/2024, 3:34 PM

## 2024-01-15 NOTE — Progress Notes (Signed)
 Occupational Therapy Session Note  Patient Details  Name: Troy Bishop MRN: [id number] Date of Birth: 1990-11-08  Today's Date: 01/15/2024 OT Individual Time: 0865-7846 OT Individual Time Calculation (min): 29 min    Short Term Goals: Week 2:  OT Short Term Goal 1 (Week 2): Pt will transfer to his L side to toilet with min A. OT Short Term Goal 2 (Week 2): Pt will be able to hold static stand during toileting with min A. OT Short Term Goal 3 (Week 2): Pt will don tshirt with min A. OT Short Term Goal 4 (Week 2): Pt will use LUE as a stabilizing A with min A,  Skilled Therapeutic Interventions/Progress Updates:  Pt greeted seated in w/c with wife present, pt agreeable to OT intervention.       NMR:  1:1 NMES applied to L wrist extensors at the below settings:    Ratio 1:3 Rate 35 pps Waveform- Asymmetric Ramp 1.0 Pulse 300 Intensity- 21-28  Duration -   15 mins  Placed cup in pts LUE and worked on functional grasp and release with pt cued to bring cup to mouth when channel was activated to work on [employer] pattern with active wrist extension as if pt was taking a sip of water for ADL participation. Pt needed support at L elbow to bring hand to mouth against gravity.    No pain or adverse reactions noted post treatment                Ended session with pt seated in w/c with all needs within reach and safety belt alarm activated.                    Therapy Documentation Precautions:  Precautions Precautions: Fall Precaution/Restrictions Comments: L hemi, neglect Restrictions Weight Bearing Restrictions Per Provider Order: No    Therapy/Group: Individual Therapy  Mollie Anger [facility] 01/15/2024, 3:15 PM

## 2024-01-15 NOTE — Progress Notes (Addendum)
 PROGRESS NOTE   Subjective/Complaints:  Pts significant other asked about some paperwork that she says was faxed over to Dr. Delorise Few.  Patient is concerned about long-term recovery of strength to his arm and leg. No additional complaints or concerns.  ROS: as per HPI. Denies fever, CP, SOB, abd pain, N/V/D/C, or any other complaints at this time.    Objective:   No results found. Recent Labs    01/15/24 0501  WBC 5.8  HGB 12.0*  HCT 36.3*  PLT 93*     Recent Labs    01/15/24 0501  NA 139  K 3.4*  CL 106  CO2 24  GLUCOSE 85  BUN 12  CREATININE 0.69  CALCIUM  9.5          Intake/Output Summary (Last 24 hours) at 01/15/2024 1451 Last data filed at 01/14/2024 1700 Gross per 24 hour  Intake 237 ml  Output --  Net 237 ml         Physical Exam: Vital Signs Blood pressure 106/64, pulse 64, temperature (!) 97.5 F (36.4 C), resp. rate 17, height 5\' 7"  (1.702 m), weight 60.5 kg, SpO2 99%.    General: No acute distress, sitting up in [facility] Mood and affect are appropriate, pleasant Heart: Regular rate and rhythm no rubs murmurs or extra sounds Lungs: Clear to auscultation, breathing unlabored, no rales or wheezes Abdomen: Positive bowel sounds, soft nontender to palpation, nondistended Extremities: No clubbing, cyanosis, or edema Skin: No evidence of breakdown, no evidence of rash Neuro: Alert and awake, dysarthric, L facial droop, L sided weakness.      PRIOR EXAMS: Neuro: attention a little better, Alert and oriented x3  Left facial droop. Speech remains dysarthric LLE 3- left hip add, 4- Left hip flexion, quad  and distally is 0/5, LUE 2-/5 -->forearm supination , finger flexion Right sided strength is 5/5. Musc: no back pain or hip pain today   Assessment/Plan: 1. Functional deficits which require 3+ hours per day of interdisciplinary therapy in a comprehensive inpatient rehab  setting. Physiatrist is providing close team supervision and 24 hour management of active medical problems listed below. Physiatrist and rehab team continue to assess barriers to discharge/monitor patient progress toward functional and medical goals  Care Tool:  Bathing    Body parts bathed by patient: Left arm, Chest, Abdomen   Body parts bathed by helper: Right arm     Bathing assist Assist Level: Moderate Assistance - Patient 50 - 74%     Upper Body Dressing/Undressing Upper body dressing   What is the patient wearing?: Pull over shirt    Upper body assist Assist Level: Moderate Assistance - Patient 50 - 74%    Lower Body Dressing/Undressing Lower body dressing      What is the patient wearing?: Underwear/pull up, Pants     Lower body assist Assist for lower body dressing: Maximal Assistance - Patient 25 - 49% (bed level)     Toileting Toileting    Toileting assist Assist for toileting: 2 Helpers     Transfers Chair/bed transfer  Transfers assist  Chair/bed transfer activity did not occur: Safety/medical concerns  Chair/bed transfer assist level: Minimal Assistance -  Patient > 75%     Locomotion Ambulation   Ambulation assist   Ambulation activity did not occur: Safety/medical concerns  Assist level: Moderate Assistance - Patient 50 - 74% Assistive device: Other (comment) (right hand rail) Max distance: 30   Walk 10 feet activity   Assist  Walk 10 feet activity did not occur: Safety/medical concerns  Assist level: Moderate Assistance - Patient - 50 - 74% Assistive device:  (right hand rail)   Walk 50 feet activity   Assist Walk 50 feet with 2 turns activity did not occur: Safety/medical concerns         Walk 150 feet activity   Assist Walk 150 feet activity did not occur: Safety/medical concerns         Walk 10 feet on uneven surface  activity   Assist Walk 10 feet on uneven surfaces activity did not occur: Safety/medical  concerns         Wheelchair     Assist Is the patient using a wheelchair?: Yes Type of Wheelchair: Manual    Wheelchair assist level: Moderate Assistance - Patient 50 - 74% Max wheelchair distance: 150    Wheelchair 50 feet with 2 turns activity    Assist        Assist Level: Moderate Assistance - Patient 50 - 74%   Wheelchair 150 feet activity     Assist      Assist Level: Moderate Assistance - Patient 50 - 74%   Blood pressure 106/64, pulse 64, temperature (!) 97.5 F (36.4 C), resp. rate 17, height 5\' 7"  (1.702 m), weight 60.5 kg, SpO2 99%.  Medical Problem List and Plan: 1. Functional deficits secondary to right basal ganglia large ICH 12/16/23             -patient may shower             -ELOS/Goals: 6/17 minA goals  -Continue CIR -Team conference tomorrow  - Discussed overall recovery from CVA  - can continue for months   2.  Antithrombotics: -DVT/anticoagulation:  Pharmaceutical: SCDs, heparin  5000U q8h             -antiplatelet therapy: N/A  3. Pain Management: Tylenol  prn. Continue gabapentin  200mg  BID -  Oxycodone  2.5mg  q6h PRN   not using    5/28 having intermittent muscular pain it appears, left hip pain better   -continue above as well as heat/ice/rom/oob  5/30 pain improved. 4. Mood/Behavior/Sleep: LCSW to follow for evaluation and support.              -antipsychotic agents: N/A  -Seroquel    Ativan  0.5mg  q6h PRN, melatonin 5mg  PRN  -5/29-   seroquel  reduced to at bedtime only -5/30 if he does well this weekend, can change seroquel  to at bedtime prn.  He already is much brighter with reduction to HS    -he is also using melatonin prn which we could schedule -01/06/24 will change seroquel  to at bedtime PRN and schedule melatonin at bedtime; pt agreeable -01/07/24 pt slept well, woke up early but not bothered by that, much more awake this morning-- leave meds as is 5. Neuropsych/cognition: This patient is not capable of making decisions on  his own behalf.  6. Skin/Wound Care: Routine pressure relief measures.   7. Fluids/Electrolytes/Nutrition: Monitor I/O. Monitor routine labs. Continue vitamins/supplements.  -Dys 3 diet with thins (SLP to eval)   -Intake improved.  Does better with food from home as well.  -6/9 potassium for hypokalemia 8.  Metabolic encephalopathy/ETOH withdrawal: Has resolved, wean seroquel  and ativan  as tolerated. Continue lactulose  30g BID   5/27 ammonia level 23  9. Abnormal LFTs:  AST/ALT trending down.    -Improved 6/9   10. ABLA?: Hgb 12's, monitor Hgb weekly   11. HTN: no meds PTA, started on amlodipine  10mg  daily, coreg  25mg  BID, losartan  100mg  daily. SBP goal <160 6/8-9 controlled   Vitals:   01/11/24 1300 01/11/24 1952 01/12/24 0457 01/12/24 1407  BP: 108/67 101/76 (!) 95/59 106/66   01/12/24 2033 01/13/24 0623 01/13/24 1302 01/13/24 1943  BP: 115/80 120/78 122/84 115/66   01/14/24 0540 01/14/24 1339 01/14/24 2003 01/15/24 0541  BP: 104/68 105/77 111/77 106/64      12. Tobacco abuse: continue nicoderm patches.   13. GI ppx: continue Protonix  40mg  daily  14. Bowel management: continue Miralax  daily  -  LBM 5/29 , is now continent  -5/30 stools liquidy---back off miralax  -01/06/24 LBM this morning, not sure what type since not documented; monitor for now -01/15/24 LBM yesterday, continue to follow  15. HLD: LDL 124, goal LDL <70, per d/c summary, start atorvastatin  40mg  once LFTs downtrend and repeat in 1 week -5/27 LFT's better yesterday -resumed atorvastatin    -LFTs cont to improved 6/2  LOS: 16 days A FACE TO FACE EVALUATION WAS PERFORMED  Lylia Sand 01/15/2024, 2:51 PM

## 2024-01-15 NOTE — Progress Notes (Signed)
 Occupational Therapy Session Note  Patient Details  Name: Troy Bishop MRN: [id number] Date of Birth: [birth year]  Today's Date: 01/15/2024 OT Individual Time: 1100-1210 OT Individual Time Calculation (min): 70 min    Short Term Goals: Week 2:  OT Short Term Goal 1 (Week 2): Pt will transfer to his L side to toilet with min A. OT Short Term Goal 2 (Week 2): Pt will be able to hold static stand during toileting with min A. OT Short Term Goal 3 (Week 2): Pt will don tshirt with min A. OT Short Term Goal 4 (Week 2): Pt will use LUE as a stabilizing A with min A,  Skilled Therapeutic Interventions/Progress Updates:    Pt received in room with wife present. Pt declined need for shower, but eager to work on his LUE.  Pt's wife attended session. Pt taken to gym and set up for a L to R transfer. Asked pt to move to the mat and I did NOT provide him cues ahead of time on purpose to see his recall. Pt did an excellent job with transferring himself smoothly, slowly, and safely with only light CGA. Pt sat on mat and worked on a variety of exercises focusing on controlled trunk rotation to the left, controlled lean to the L,  facilitated shoulder flexion and extension using his hand on a hula hoop to roll back and forth. Faciltiated grasp on hoop (used foam grip).  Added in estim for deltoids and finger flexors to integrate into activity for 20 min and then 10 min of sh with finger extensors.  Pt has trace finger flexion.  Pt with many questions about subluxation. Used an anatomical model to help explain what is going on and how estim and kinesiotape is trying to help facilitate shoulder stability. Replaced kinesiotape with new tape. Pt transferred back to his LEFT side with great control and only CGA demonstrating strong progress.  Pt returned to his room with his spouse and all needs met.     Therapy Documentation Precautions:  Precautions Precautions: Fall Precaution/Restrictions Comments: L hemi,  neglect Restrictions Weight Bearing Restrictions Per Provider Order: No    Vital Signs: Therapy Vitals Temp: (!) 97.5 F (36.4 C) Pulse Rate: 64 Resp: 17 BP: 106/64 Patient Position (if appropriate): Lying Oxygen Therapy SpO2: 99 % O2 Device: Room Air Pain:   ADL: ADL Eating: Moderate assistance Grooming: Minimal assistance Upper Body Bathing: Moderate assistance Where Assessed-Upper Body Bathing: Sitting at sink Lower Body Bathing: Maximal assistance Where Assessed-Lower Body Bathing: Sitting at sink Upper Body Dressing: Moderate assistance Where Assessed-Upper Body Dressing: Sitting at sink Lower Body Dressing: Maximal assistance Where Assessed-Lower Body Dressing: Sitting at sink   Therapy/Group: Individual Therapy  Tamilyn Lupien 01/15/2024, 8:29 AM

## 2024-01-15 NOTE — Progress Notes (Signed)
 Educated patient again with importance of wearing his left hand splint, cooperative on placing splint on and cooperative in leaving it on throughout shift

## 2024-01-15 NOTE — Plan of Care (Signed)
 Goal downgraded due to patients current progress Problem: RH Awareness Goal: LTG: Patient will demonstrate awareness during functional activites type of (SLP) Description: LTG: Patient will demonstrate awareness during functional activites type of (SLP) Flowsheets Taken 01/15/2024 1536 LTG: Patient will demonstrate awareness during cognitive/linguistic activities with assistance of (SLP): Moderate Assistance - Patient 50 - 74% Taken 01/01/2024 1440 Patient will demonstrate during cognitive/linguistic activities awareness type of: Intellectual

## 2024-01-15 NOTE — Progress Notes (Signed)
 Physical Therapy Session Note  Patient Details  Name: Troy Bishop MRN: [id number] Date of Birth: Jun 27, 1991  Today's Date: 01/15/2024 PT Individual Time: 0802-0904 PT Individual Time Calculation (min): 62 min   Short Term Goals: Week 1:  PT Short Term Goal 1 (Week 1): Pt will transfer sup to sit w/ mod A consistently. PT Short Term Goal 1 - Progress (Week 1): Met PT Short Term Goal 2 (Week 1): Pt will maintain seated balance x 5' w/ CGA PT Short Term Goal 2 - Progress (Week 1): Met PT Short Term Goal 3 (Week 1): Pt will transfer sit to stand w/ mod A + 1 PT Short Term Goal 3 - Progress (Week 1): Met PT Short Term Goal 4 (Week 1): PT to assess gait. PT Short Term Goal 4 - Progress (Week 1): Met Week 2:  PT Short Term Goal 1 (Week 2): Pt will maintain static stand balance w/ Mod A to maintain L knee extension PT Short Term Goal 2 (Week 2): Pt will perform stand pivot transfer w/ mod A w/ LRAD PT Short Term Goal 3 (Week 2): Pt will initiate w/c mobility PT Short Term Goal 4 (Week 2): PT to initiate AFO consult.   Skilled Therapeutic Interventions/Progress Updates:  Patient supine in bed on entrance to room. Patient alert and agreeable to PT session.   Patient with no pain complaint at start of session. Wife present and assisting pt with eating banana.   Therapeutic Activity: Bed Mobility: Pt performed supine > sit with overall supervision and CGA with vc for LLE off EOB. Seated balance fair/ good with no noted pushing. Shoes donned with MaxA for time.  Transfers: Pt performed sit<>stand transfers with MinA using push up to stance and CGA using handrail to stand. Self chooses stand pivot to transfer bed > w/c with MinA. Focus this session on squat pivot transfers to each side and pt requires vc to allow for setup prior to initiation d/t impulsivity. Provided vc/ tc for safety and proper positioning/ setup of assist throughout. He is able to execute each squat pivot with light CGA.   Gait  Training/ NMR:  Pt ambulated along R hallway HR and completes 32 ft using with overall MinA for intermittent LLE advancement and LE positioning on foot strike. Demonstrated flexed posture but siginficant improvement in LLE step initiation, and is able to intermittently self-correct LE ER upon heel strike. Provided vc/ tc for corrections.   Progressed amb to using HHA to R and Min/ ModA to L  with good initiation of step progression at hip, improving lateral weight shift appropriately, but requires facilitation of  forward hip translation to LLE especially with rearward rotation of pelvis on LLE with forward step progression of RLE. Pt covers 160 ft with Min/ ModA and HHA from +2. Pt is able to intermittently self correct LLE ER with vc only.   Neuromuscular Re-ed: NMR facilitated during session with focus on standing balance and pre-gait training. Pt guided in lateral weight shift  and fwd/ bkwd stepping using HW. Pt progresses slowly with proper positioning of HW and not overweighting to RUE. Is able to progress to steps forward using HW with vc throughout for proper sequence and MinA overall from therapist ant guard from +2. Pt requires ModA for stepping LLE backward.  NMR performed for improvements in motor control and coordination, balance, sequencing, judgement, and self confidence/ efficacy in performing all aspects of mobility at highest level of independence.   Patient seated upright at  end of session with brakes locked, belt alarm set, and all needs within reach.  Therapy Documentation Precautions:  Precautions Precautions: Fall Precaution/Restrictions Comments: L hemi, neglect Restrictions Weight Bearing Restrictions Per Provider Order: No  Pain:  No pain related this session.  Therapy/Group: Individual Therapy  Donne Gage PT, DPT, CSRS 01/15/2024, 9:01 AM

## 2024-01-16 NOTE — Progress Notes (Signed)
 Occupational Therapy Session Note  Patient Details  Name: Troy Bishop MRN: [id number] Date of Birth: 1991/01/12  Today's Date: 01/16/2024 OT Individual Time: 1015-1100 OT Individual Time Calculation (min): 45 min    Short Term Goals: Week 1:  OT Short Term Goal 1 (Week 1): Pt will maintain sitting balance on toilet with min A with max cues OT Short Term Goal 1 - Progress (Week 1): Met OT Short Term Goal 2 (Week 1): Pt will don shirt with mod A with cues OT Short Term Goal 2 - Progress (Week 1): Met OT Short Term Goal 3 (Week 1): Pt will attend to left visual field to obtain items for ADL with min questioning cuing OT Short Term Goal 3 - Progress (Week 1): Met OT Short Term Goal 4 (Week 1): Pt will transfer with mod A +1 consistently OT Short Term Goal 4 - Progress (Week 1): Met Week 2:  OT Short Term Goal 1 (Week 2): Pt will transfer to his L side to toilet with min A. OT Short Term Goal 2 (Week 2): Pt will be able to hold static stand during toileting with min A. OT Short Term Goal 3 (Week 2): Pt will don tshirt with min A. OT Short Term Goal 4 (Week 2): Pt will use LUE as a stabilizing A with min A,      Skilled Therapeutic Interventions/Progress Updates:    Pt received in w/c with wife present and stated he did not need to shower today. His wife said she will bathe him this evening in the bed.   Pt taken to gym and worked on standing at a table for at least 40 minutes! With guarding of L knee and pt using R hand on table pt stood with min A.  -sliding bean bag across table for A/arom of shoulder, pt able to loosely grab bar and flex elbow to bring bag to chest level -grasping a wash cloth and pushing in on table towards opposite side -lifting cloth towards chin -modified table push ups for weight bearing on L hand and mini squats with legs  Seated arm with hand over hand guiding for holding plastic rings guiding rings along an arc  Tried a few minutes of mirror therapy to introduce  that concept.  Pt demonstrated good improvements in attention, focus, balance and movement of LUE.     Pt has not been wearing splint bc of discomfort/itchiness.  Pt is developing tone in finger flexors so will need to start wearing it.  Upon return to room, fit splint on pt to ensure good fit. Suggested placing a washcloth or piece of fabric between skin and splint if uncomfortable tonight.    Pt returned to room with wife and all needs met.    Therapy Documentation Precautions:  Precautions Precautions: Fall Precaution/Restrictions Comments: L hemi, neglect Restrictions Weight Bearing Restrictions Per Provider Order: No    Vital Signs:   Pain: Pain Assessment Pain Score: 0-No pain ADL: ADL Eating: Moderate assistance Grooming: Minimal assistance Upper Body Bathing: Moderate assistance Where Assessed-Upper Body Bathing: Sitting at sink Lower Body Bathing: Maximal assistance Where Assessed-Lower Body Bathing: Sitting at sink Upper Body Dressing: Moderate assistance Where Assessed-Upper Body Dressing: Sitting at sink Lower Body Dressing: Maximal assistance Where Assessed-Lower Body Dressing: Sitting at sink      Therapy/Group: Individual Therapy  Kisean Rollo 01/16/2024, 1:21 PM

## 2024-01-16 NOTE — Progress Notes (Signed)
 Speech Language Pathology Daily Session Note  Patient Details  Name: Troy Bishop MRN: [id number] Date of Birth: September 15, 1990  Today's Date: 01/16/2024 SLP Individual Time: 0800-0859 SLP Individual Time Calculation (min): 59 min  Short Term Goals: Week 3: SLP Short Term Goal 1 (Week 3): STG = LTG due to elos  Skilled Therapeutic Interventions: Skilled therapy session focused on dysarthria and cognitive goals. SLP facilitated session by reviewing speech intelligibility strategies. Patient independently recalled 4/4 SLOP strategies and utilized with modA to achieve 85% intelligibility at the sentence level. SLP targeted lingual strength through use of IOPI ([place]  Oral Performance Instrument). Patient with max anterior kpa of 45 and posterior kpa of 35. After maximum was obtained, patient complete 5 repetitions anteriorly at 35 kpa and posteriorly at 25kpa. SLP targeted cognitive goals through awareness task. Today, patient independently recalled he had a stroke and subsequent changes in his physical movement and speech. Patient required modA to recall changes in cognition. Patient oriented to location and time independently. Patient left in bed with alarm set and call bell in reach. Continue POC.  Pain Back pain - no intervention requested  Therapy/Group: Individual Therapy  Heather Mckendree M.A., CCC-SLP 01/16/2024, 7:36 AM

## 2024-01-16 NOTE — Progress Notes (Addendum)
 Physical Therapy Session Note  Patient Details  Name: Troy Bishop MRN: [id number] Date of Birth: 1991-02-01  Today's Date: 01/16/2024 PT Individual Time: 1134-1200; 1307 - 1405 PT Individual Time Calculation (min): 26 min; 58 min   Short Term Goals: Week 2:  PT Short Term Goal 1 (Week 2): Pt will maintain static stand balance w/ Mod A to maintain L knee extension PT Short Term Goal 2 (Week 2): Pt will perform stand pivot transfer w/ mod A w/ LRAD PT Short Term Goal 3 (Week 2): Pt will initiate w/c mobility PT Short Term Goal 4 (Week 2): PT to initiate AFO consult.  SESSION 1 Skilled Therapeutic Interventions/Progress Updates: Patient sitting in [facility] with wife present on entrance to room. Patient alert and agreeable to PT session.   Patient with no complaints of pain, only discomfort in B LE's due to sitting (active movement in standing provided during session). Pt also with questions regarding why pt can lift L LE towards chest when in supine, but cannot keep it straight like he can with R LE. PTA provided education that pt L LE musculature still requires more effort to control/activate vs R. Pt is making great progress regarding L LE coordination/neuromuscular connection during while WB with noted quadriceps activation during stance phase (GRAFO still used - may potentially progress to PLS - PT to assess this week if deemed appropriate).   Therapeutic Activity: Transfers: Pt performed sit<>stand transfers in day room from 4 surfaces (mat, WC, and 2 chairs) with R HW and overall minA to navigate pivot sequence/safety awareness and added cuing to increase step length when retro stepping to sit. Multiple rounds performed with some moments being CGA. Pt slightly impulsive to advance steps, but able to rein in with VC.  - Pt ambulated from day room<nsg station (roughly 50') with R HW and VC to increase step length B LE  Patient sitting in WC at end of session with brakes locked, wife present, belt  alarm set, and all needs within reach.  SESSION 2 Skilled Therapeutic Interventions/Progress Updates: Patient sitting in [facility] with wife present on entrance to room. Patient alert and agreeable to PT session.   Patient with no complaints of pain during session. Pt progressed from [facility] to RW this session as noted bellow. Due to pt's significant progression this past week, PTA discussed with pt and pt's wife about potentially staying longer than anticipate d/c date (will need to discuss with rehab team) with education and encouragement provided with pt eventually seeing benefits after wife also discussed it with pt.   Therapeutic Activity: Transfers: Pt performed sit<>stands in preparation for ambulation (and sitting down from ambulation) with minA and RW and VC for safe management of RW and pivoting steps.  Gait Training:  Pt ambulated roughly 90' x 1, and 110' x 1 with RW (L hemi-grip). Pt demonstrated the following gait deviations with therapist providing the described cuing and facilitation for improvement: L GRAFO donned - Decrease weight shift B with manual facilitation  - Decreased step clearance L LE and step length B LE (step-to pattern) - improved quadriceps contraction during stance  minA overall when ambulating in straight line, and min/modA when turning with cues for safety awareness and L LE coordination. Pt improved turning safety as session progressed with continued cuing to coordinate L LE safely to avoid hitting back L leg of RW Pt ambulated from main gym<room at end of session with same level of assistance, AD, and VC (150'+)  Neuromuscular Re-ed: -  Standing in // bars with R UE supported with cues to perform knee flexion L LE. Pt with decreased coordination  to isolate flexors/hip extensors. Pt attempted multiple times but deferred to perform seated knee flexion L LE with yellow theraband and cues to control eccentric. Pt back to standing in // bar and with slight improvement with  knee flexion, although still difficult to accurately determine at this time if it was more hip extensor compensation   NMR performed for improvements in motor control and coordination, balance, sequencing, judgement, and self confidence/ efficacy in performing all aspects of mobility at highest level of independence.   Patient sitting in WC at end of session with brakes locked, wife present, belt alarm set, and all needs within reach.       Therapy Documentation Precautions:  Precautions Precautions: Fall Precaution/Restrictions Comments: L hemi, neglect Restrictions Weight Bearing Restrictions Per Provider Order: No  Therapy/Group: Individual Therapy  Neoma Uhrich PTA 01/16/2024, 12:21 PM

## 2024-01-16 NOTE — Progress Notes (Signed)
 PROGRESS NOTE   Subjective/Complaints:  Questions regarding f/u therapy (same question asked multiple times) prefers HH but may have insurance barriers  ROS: as per HPI. Denies fever, CP, SOB, abd pain, N/V/D/C, or any other complaints at this time.    Objective:   No results found. Recent Labs    01/15/24 0501  WBC 5.8  HGB 12.0*  HCT 36.3*  PLT 93*     Recent Labs    01/15/24 0501  NA 139  K 3.4*  CL 106  CO2 24  GLUCOSE 85  BUN 12  CREATININE 0.69  CALCIUM  9.5          Intake/Output Summary (Last 24 hours) at 01/16/2024 0807 Last data filed at 01/15/2024 1400 Gross per 24 hour  Intake 480 ml  Output --  Net 480 ml         Physical Exam: Vital Signs Blood pressure (!) 96/58, pulse 68, temperature 98.2 F (36.8 C), resp. rate 18, height 5\' 7"  (1.702 m), weight 60.5 kg, SpO2 100%.    General: No acute distress, sitting up in [facility] Mood and affect are appropriate, pleasant Heart: Regular rate and rhythm no rubs murmurs or extra sounds Lungs: Clear to auscultation, breathing unlabored, no rales or wheezes Abdomen: Positive bowel sounds, soft nontender to palpation, nondistended Extremities: No clubbing, cyanosis, or edema Skin: No evidence of breakdown, no evidence of rash Neuro: Alert and awake, dysarthric, L facial droop, L sided weakness.      PRIOR EXAMS: Neuro: attention a little better, Alert and oriented x3  Left facial droop. Speech remains dysarthric LLE 3- left hip add, 4- Left hip flexion, quad  and distally is 0/5, LUE 2-/5 -->forearm supination , finger flexion Right sided strength is 5/5. Musc: no back pain or hip pain today   Assessment/Plan: 1. Functional deficits which require 3+ hours per day of interdisciplinary therapy in a comprehensive inpatient rehab setting. Physiatrist is providing close team supervision and 24 hour management of active medical problems listed  below. Physiatrist and rehab team continue to assess barriers to discharge/monitor patient progress toward functional and medical goals  Care Tool:  Bathing    Body parts bathed by patient: Left arm, Chest, Abdomen   Body parts bathed by helper: Right arm     Bathing assist Assist Level: Moderate Assistance - Patient 50 - 74%     Upper Body Dressing/Undressing Upper body dressing   What is the patient wearing?: Pull over shirt    Upper body assist Assist Level: Moderate Assistance - Patient 50 - 74%    Lower Body Dressing/Undressing Lower body dressing      What is the patient wearing?: Underwear/pull up, Pants     Lower body assist Assist for lower body dressing: Maximal Assistance - Patient 25 - 49% (bed level)     Toileting Toileting    Toileting assist Assist for toileting: 2 Helpers     Transfers Chair/bed transfer  Transfers assist  Chair/bed transfer activity did not occur: Safety/medical concerns  Chair/bed transfer assist level: Minimal Assistance - Patient > 75%     Locomotion Ambulation   Ambulation assist   Ambulation activity did not  occur: Safety/medical concerns  Assist level: Moderate Assistance - Patient 50 - 74% Assistive device: Other (comment) (right hand rail) Max distance: 30   Walk 10 feet activity   Assist  Walk 10 feet activity did not occur: Safety/medical concerns  Assist level: Moderate Assistance - Patient - 50 - 74% Assistive device:  (right hand rail)   Walk 50 feet activity   Assist Walk 50 feet with 2 turns activity did not occur: Safety/medical concerns         Walk 150 feet activity   Assist Walk 150 feet activity did not occur: Safety/medical concerns         Walk 10 feet on uneven surface  activity   Assist Walk 10 feet on uneven surfaces activity did not occur: Safety/medical concerns         Wheelchair     Assist Is the patient using a wheelchair?: Yes Type of Wheelchair:  Manual    Wheelchair assist level: Moderate Assistance - Patient 50 - 74% Max wheelchair distance: 150    Wheelchair 50 feet with 2 turns activity    Assist        Assist Level: Moderate Assistance - Patient 50 - 74%   Wheelchair 150 feet activity     Assist      Assist Level: Moderate Assistance - Patient 50 - 74%   Blood pressure (!) 96/58, pulse 68, temperature 98.2 F (36.8 C), resp. rate 18, height 5\' 7"  (1.702 m), weight 60.5 kg, SpO2 100%.  Medical Problem List and Plan: 1. Functional deficits secondary to right basal ganglia large ICH 12/16/23             -patient may shower             -ELOS/Goals: 6/17 minA goals  -Continue CIR -Team conference tomorrow  - Discussed overall recovery from CVA  - can continue for months   2.  Antithrombotics: -DVT/anticoagulation:  Pharmaceutical: SCDs, heparin  5000U q8h             -antiplatelet therapy: N/A  3. Pain Management: Tylenol  prn. Continue gabapentin  200mg  BID -  Oxycodone  2.5mg  q6h PRN   not using    5/28 having intermittent muscular pain it appears, left hip pain better   -continue above as well as heat/ice/rom/oob  5/30 pain improved. 4. Mood/Behavior/Sleep: LCSW to follow for evaluation and support.              -antipsychotic agents: N/A  -Seroquel    Ativan  0.5mg  q6h PRN, melatonin 5mg  PRN  -5/29-   seroquel  reduced to at bedtime only -5/30 if he does well this weekend, can change seroquel  to at bedtime prn.  He already is much brighter with reduction to HS    -he is also using melatonin prn which we could schedule -01/06/24 will change seroquel  to at bedtime PRN and schedule melatonin at bedtime; pt agreeable -01/07/24 pt slept well, woke up early but not bothered by that, much more awake this morning-- leave meds as is 5. Neuropsych/cognition: This patient is not capable of making decisions on his own behalf.  6. Skin/Wound Care: Routine pressure relief measures.   7. Fluids/Electrolytes/Nutrition:  Monitor I/O. Monitor routine labs. Continue vitamins/supplements.  -Dys 3 diet with thins (SLP to eval)   -Intake improved.  Does better with food from home as well.  -6/9 potassium for hypokalemia Recheck in am  8. Metabolic encephalopathy/ETOH withdrawal: Has resolved, wean seroquel  and ativan  as tolerated. Continue lactulose  30g BID  5/27 ammonia level 23  9. Abnormal LFTs:  AST/ALT trending down.    -Improved 6/9   10. ABLA?: Hgb 12's, monitor Hgb weekly   11. HTN: no meds PTA, started on amlodipine  10mg  daily, coreg  25mg  BID, losartan  100mg  daily. SBP goal <160 6/8-9 controlled   Vitals:   01/12/24 1407 01/12/24 2033 01/13/24 0623 01/13/24 1302  BP: 106/66 115/80 120/78 122/84   01/13/24 1943 01/14/24 0540 01/14/24 1339 01/14/24 2003  BP: 115/66 104/68 105/77 111/77   01/15/24 0541 01/15/24 1940 01/15/24 2024 01/16/24 0516  BP: 106/64 111/73 116/72 (!) 96/58      12. Tobacco abuse: continue nicoderm patches.   13. GI ppx: continue Protonix  40mg  daily  14. Bowel management: continue Miralax  daily  -  LBM 5/29 , is now continent  -5/30 stools liquidy---back off miralax  -01/06/24 LBM this morning, not sure what type since not documented; monitor for now -01/15/24 LBM yesterday, continue to follow  15. HLD: LDL 124, goal LDL <70, per d/c summary, start atorvastatin  40mg  once LFTs downtrend and repeat in 1 week -5/27 LFT's better yesterday -resumed atorvastatin    -LFTs cont to improved 6/2  LOS: 17 days A FACE TO FACE EVALUATION WAS PERFORMED  Genetta Kenning 01/16/2024, 8:07 AM

## 2024-01-16 NOTE — Progress Notes (Signed)
 The pts wife informed staff that the pt fell. Upon entry to the room the pt was sitting on the ground. He was previously seated in the wheelchair. The pt was seated on the ground with the lap belt still secured around him. His wife witnessed the fall. She reports that he scooted out of the chair. She denies him hitting his head on the way down. When the pt was questioned about what caused him to fall he kept saying " I did not fall". The pt was assisted to the bed.

## 2024-01-16 NOTE — Plan of Care (Signed)
  Problem: Consults Goal: RH STROKE PATIENT EDUCATION Description: See Patient Education module for education specifics  Outcome: Progressing   Problem: RH BOWEL ELIMINATION Goal: RH STG MANAGE BOWEL WITH ASSISTANCE Description: STG Manage Bowel with supervision Assistance. Outcome: Progressing   Problem: RH BLADDER ELIMINATION Goal: RH STG MANAGE BLADDER WITH ASSISTANCE Description: STG Manage Bladder With  supervision Assistance Outcome: Progressing   Problem: RH SKIN INTEGRITY Goal: RH STG SKIN FREE OF INFECTION/BREAKDOWN Description: Manage skin free of infection/breakdown with supervision assistance Outcome: Progressing   Problem: RH SAFETY Goal: RH STG ADHERE TO SAFETY PRECAUTIONS W/ASSISTANCE/DEVICE Description: STG Adhere to Safety Precautions With supervision  Assistance/Device. Outcome: Progressing   Problem: RH PAIN MANAGEMENT Goal: RH STG PAIN MANAGED AT OR BELOW PT'S PAIN GOAL Description: < 4 w/prns Outcome: Progressing   Problem: RH KNOWLEDGE DEFICIT Goal: RH STG INCREASE KNOWLEDGE OF HYPERTENSION Description: Manage increase knowledge of hypertension with supervision assistance from wife using educational materials provided Outcome: Progressing Goal: RH STG INCREASE KNOWLEDGE OF DYSPHAGIA/FLUID INTAKE Outcome: Progressing Goal: RH STG INCREASE KNOWLEGDE OF HYPERLIPIDEMIA Description: Manage increase knowledge of hyperlipidemia  with supervision assistance from wife using educational materials provided Outcome: Progressing Goal: RH STG INCREASE KNOWLEDGE OF STROKE PROPHYLAXIS Description: Manage increase knowledge of stroke prophylaxis with supervision assistance from wife using educational materials provided Outcome: Progressing   Problem: RH Vision Goal: RH LTG Vision (Specify) Outcome: Progressing

## 2024-01-17 DIAGNOSIS — I10 Essential (primary) hypertension: Secondary | ICD-10-CM | POA: Insufficient documentation

## 2024-01-17 LAB — POTASSIUM: Potassium: 3.5 mmol/L (ref 3.5–5.1)

## 2024-01-17 MED ORDER — POTASSIUM CHLORIDE CRYS ER 20 MEQ PO TBCR
40.0000 meq | EXTENDED_RELEASE_TABLET | Freq: Once | ORAL | Status: AC
  Administered 2024-01-17: 40 meq via ORAL
  Filled 2024-01-17: qty 2

## 2024-01-17 NOTE — Plan of Care (Signed)
  Problem: RH Balance Goal: LTG Patient will maintain dynamic standing with ADLs (OT) Description: LTG:  Patient will maintain dynamic standing balance with assist during activities of daily living (OT)  Flowsheets (Taken 01/17/2024 1252) LTG: Pt will maintain dynamic standing balance during ADLs with: (LTG upgraded due to progress with L side movement.) Contact Guard/Touching assist Note: LTG upgraded due to progress with L side movement.    Problem: Sit to Stand Goal: LTG:  Patient will perform sit to stand in prep for activites of daily living with assistance level (OT) Description: LTG:  Patient will perform sit to stand in prep for activites of daily living with assistance level (OT) Flowsheets (Taken 01/17/2024 1252) LTG: PT will perform sit to stand in prep for activites of daily living with assistance level: (LTG upgraded due to progress with L side movement.) Supervision/Verbal cueing Note: LTG upgraded due to progress with L side movement.    Problem: RH Bathing Goal: LTG Patient will bathe all body parts with assist levels (OT) Description: LTG: Patient will bathe all body parts with assist levels (OT) Flowsheets (Taken 01/17/2024 1252) LTG: Pt will perform bathing with assistance level/cueing: Contact Guard/Touching assist LTG: Position pt will perform bathing: Shower   Problem: RH Toileting Goal: LTG Patient will perform toileting task (3/3 steps) with assistance level (OT) Description: LTG: Patient will perform toileting task (3/3 steps) with assistance level (OT)  Flowsheets (Taken 01/17/2024 1252) LTG: Pt will perform toileting task (3/3 steps) with assistance level: (LTG upgraded due to progress with L side movement.) Contact Guard/Touching assist Note: LTG upgraded due to progress with L side movement.    Problem: RH Toilet Transfers Goal: LTG Patient will perform toilet transfers w/assist (OT) Description: LTG: Patient will perform toilet transfers with assist,  with/without cues using equipment (OT) Flowsheets (Taken 01/17/2024 1252) LTG: Pt will perform toilet transfers with assistance level of: (LTG upgraded due to progress with L side movement.) Supervision/Verbal cueing Note: LTG upgraded due to progress with L side movement.    Problem: RH Tub/Shower Transfers Goal: LTG Patient will perform tub/shower transfers w/assist (OT) Description: LTG: Patient will perform tub/shower transfers with assist, with/without cues using equipment (OT) Flowsheets (Taken 01/17/2024 1252) LTG: Pt will perform tub/shower stall transfers with assistance level of: (LTG upgraded due to progress with L side movement.) Contact Guard/Touching assist LTG: Pt will perform tub/shower transfers from: Tub/shower combination Note: LTG upgraded due to progress with L side movement.

## 2024-01-17 NOTE — Progress Notes (Addendum)
 Patient ID: Troy Bishop, male   DOB: 30-Sep-1990, 33 y.o.   MRN: [id number]  SW faxed FMLA forms for pt wife to [employer] at [phone].  SW met with pt and pt wife in room to provide above forms, and discuss updates from team conference. He is in agreement with change in d/c date to 6/24 based on gains made in rehab.  Norval Been, MSW, LCSW Office: [phone] Cell: [phone] Fax: [phone]

## 2024-01-17 NOTE — Patient Care Conference (Signed)
 Inpatient RehabilitationTeam Conference and Plan of Care Update Date: 01/17/2024   Time: 10:27 AM    Patient Name: Troy Bishop      Medical Record Number: [id number]  Date of Birth: 10-Feb-1991 Sex: Male         Room/Bed: 4W20C/4W20C-01 Payor Info: Payor: [place] [employer]ICA[employer]AN / Plan: [place] MEDICAID [employer] [place] / Product Type: *No Product type* /    Admit Date/Time:  12/30/2023  5:40 PM  Primary Diagnosis:  ICH (intracerebral hemorrhage) [facility])  Hospital Problems: Principal Problem:   ICH (intracerebral hemorrhage) (HCC) Active Problems:   Acute blood loss anemia   Anxiety state   Essential hypertension    Expected Discharge Date: Expected Discharge Date: 01/30/24  Team Members Present: Physician leading conference: Dr. Janeece Mechanic Social Worker Present: Norval Been, LCSW Nurse Present: Forrestine Ike, RN PT Present: Javan Messing, PT;Dominic Sandoval, PTA OT Present: Kenda Paula, OT SLP Present: Reggie Caper, SLP PPS Coordinator present : Jestine Moron, SLP     Current Status/Progress Goal Weekly Team Focus  Bowel/Bladder      Continent of bowel and bladder          Swallow/Nutrition/ Hydration   reg/thin   minA  education and use of swallowing compensatory strategies    ADL's   progressing extremely well with balance, trunk control - can now complete squat pivot transfers and sit to stands with CGA/light min A,  static stand with min -mod A to support L knee,  mod A of 2 for toileting,  min UB dressing and bathing, max LB dressing . Developing LUE active movement. increased bicep flex to 90.   CGA to min A overall   NMR, postural control, ADL training, fam ed, LUE NMR    Mobility   Bed mobility - CGA/minA, Transfers - CGA SPT and min/modA RW (just started); Ambulation 150'+ with RW and min/modA   supervision for bed mobility, minA for transfers and ambulation, supervision for sitting and standing balance  Focus - L hemi NMRE, attn  to task, gait with RW and improved gait pattern, increase knee flexor/hip abductor activation, pt/family ed, dynamic standing balance    Communication   ~85% intelligible at the sentence level   min A 90% at phrase   education and recall of SLOP strategies    Safety/Cognition/ Behavioral Observations  mod A   min A   awareness of cognitive deficits, STM, L attention, problem solving    Pain      Left arm and hand pain; addressed with prn meds    Pain < 4 with prns   Assess need for and effectiveness of prn meds   Skin      N/a           Discharge Planning:  Pt will d/c to home with his wife; wife is primary caregiver of their four children. No other supports. SW will confirm there are no barriers to discharge.   Team Discussion: Patient post ICH with history of ETOH; cirrhosis; now off lactulose  and MD monitoring ammonia levels.   Patient on target to meet rehab goals: yes, carry over is better and patient is attending well in sessions.  Continues to be limited by perseveration and flexor tone but attention has improved. Needs CGA for squat pivot transfers.  Min assist overall for ADLs.  Able to ambulate up to 200' using a RW with CGA - Min assist.   *See Care Plan and progress notes for long and  short-term goals.   Revisions to Treatment Plan:  AFO IOPI   Teaching Needs: Safety, medications, dietary modification, transfers, toileting, etc.   Current Barriers to Discharge: Decreased caregiver support and Home enviroment access/layout  Possible Resolutions to Barriers: Family education     Medical Summary Current Status: Hypokalemia likely due to frequent stooling.  Ammonia level normal, no clear-cut diagnosis of cirrhosis  Barriers to Discharge: Medical stability   Possible Resolutions to [employer] Focus: Will try stopping lactulose , repeat potassium, monitor serum ammonia   Continued Need for Acute Rehabilitation Level of Care: The patient requires  daily medical management by a physician with specialized training in physical medicine and rehabilitation for the following reasons: Direction of a multidisciplinary physical rehabilitation program to maximize functional independence : Yes Medical management of patient stability for increased activity during participation in an intensive rehabilitation regime.: Yes Analysis of laboratory values and/or radiology reports with any subsequent need for medication adjustment and/or medical intervention. : Yes   I attest that I was present, lead the team conference, and concur with the assessment and plan of the team.   Forrestine Ike B 01/19/2024, 9:06 AM

## 2024-01-17 NOTE — Plan of Care (Signed)
  Problem: Consults Goal: RH STROKE PATIENT EDUCATION Description: See Patient Education module for education specifics  Outcome: Progressing   Problem: RH BOWEL ELIMINATION Goal: RH STG MANAGE BOWEL WITH ASSISTANCE Description: STG Manage Bowel with  supervision Assistance. Outcome: Progressing   Problem: RH BLADDER ELIMINATION Goal: RH STG MANAGE BLADDER WITH ASSISTANCE Description: STG Manage Bladder With supervision Assistance Outcome: Progressing   Problem: RH SKIN INTEGRITY Goal: RH STG SKIN FREE OF INFECTION/BREAKDOWN Description: Manage skin free of infection/breakdown with supervision assistance Outcome: Progressing   Problem: RH SAFETY Goal: RH STG ADHERE TO SAFETY PRECAUTIONS W/ASSISTANCE/DEVICE Description: STG Adhere to Safety Precautions With supervision Assistance/Device. Outcome: Progressing

## 2024-01-17 NOTE — Progress Notes (Signed)
 Physical Therapy Session Note  Patient Details  Name: Troy Bishop MRN: [id number] Date of Birth: 1990-12-31  Today's Date: 01/17/2024 PT Individual Time: 1400-1445 PT Individual Time Calculation (min): 45 min   Short Term Goals: Week 2:  PT Short Term Goal 1 (Week 2): Pt will maintain static stand balance w/ Mod A to maintain L knee extension PT Short Term Goal 2 (Week 2): Pt will perform stand pivot transfer w/ mod A w/ LRAD PT Short Term Goal 3 (Week 2): Pt will initiate w/c mobility PT Short Term Goal 4 (Week 2): PT to initiate AFO consult.  Skilled Therapeutic Interventions/Progress Updates: Pt presents sitting in w/c and agreeable to therapy.  Pt wheeled to main gym for time conservation.  Pt ed on sit <> stand transfers w/ R hand on arm rest and improving to min/CGA only although occ verbal cues for LLE placement for balance.  Pt required min A for placement of L hand in ortho aide and then pt attaches velcro strap.  Pt amb multiple loops around gym w/ min A (mostly for maintaining forward advancement of L side of RW and cues for forward gaze.  LLE advancing and proper placement ~40% of time including proper step length.  Pt amb w/ reciprocal gait pattern.  Pt performed retro gait w/ and w/o AD and occasional LOB 2/2 LLE ER/decreased BOS.  Pt amb w/o AD and Min A x 90' LOB x 2 2/2 narrowed BOS.  Pt returned to room and remained sitting in w/c w/ chair alarm on and all needs in reach, spouse present.     Therapy Documentation Precautions:  Precautions Precautions: Fall Precaution/Restrictions Comments: L hemi, neglect Restrictions Weight Bearing Restrictions Per Provider Order: No General:   Vital Signs: Therapy Vitals Temp: 98 F (36.7 C) Temp Source: Oral Pulse Rate: 66 Resp: 18 BP: 104/69 Patient Position (if appropriate): Sitting Oxygen Therapy SpO2: 100 % O2 Device: Room Air Pain:0/10    Therapy/Group: Individual Therapy  Goran Olden P Koden Hunzeker 01/17/2024, 2:48 PM

## 2024-01-17 NOTE — Progress Notes (Signed)
 Occupational Therapy Weekly Progress Note  Patient Details  Name: Troy Bishop MRN: [id number] Date of Birth: [birth year]  Beginning of progress report period: January 08, 2024 End of progress report period: January 17, 2024   Patient has met 4 of 4 short term goals.  Pt is making excellent progress and is now at an overall min to mod A level with self care. Pt will benefit from further rehab to reach a CGA to S level to enable family members to care for him more easily at home. He has been making improvements with attention, postural control and LUE active muscle movement.   Patient continues to demonstrate the following deficits: abnormal tone, unbalanced muscle activation, and decreased coordination, decreased attention to left, decreased attention, decreased awareness, decreased problem solving, decreased safety awareness, and delayed processing, and decreased sitting balance, decreased standing balance, decreased postural control, hemiplegia, and decreased balance strategies and therefore will continue to benefit from skilled OT intervention to enhance overall performance with BADL.  Patient progressing toward long term goals..  Plan of care revisions:  . Problem: RH Balance Goal: LTG Patient will maintain dynamic standing with ADLs (OT) Description: LTG:  Patient will maintain dynamic standing balance with assist during activities of daily living (OT)  Flowsheets (Taken 01/17/2024 1252) LTG: Pt will maintain dynamic standing balance during ADLs with: (LTG upgraded due to progress with L side movement.) Contact Guard/Touching assist Note: LTG upgraded due to progress with L side movement.    Problem: Sit to Stand Goal: LTG:  Patient will perform sit to stand in prep for activites of daily living with assistance level (OT) Description: LTG:  Patient will perform sit to stand in prep for activites of daily living with assistance level (OT) Flowsheets (Taken 01/17/2024 1252) LTG: PT will perform sit to  stand in prep for activites of daily living with assistance level: (LTG upgraded due to progress with L side movement.) Supervision/Verbal cueing Note: LTG upgraded due to progress with L side movement.    Problem: RH Bathing Goal: LTG Patient will bathe all body parts with assist levels (OT) Description: LTG: Patient will bathe all body parts with assist levels (OT) Flowsheets (Taken 01/17/2024 1252) LTG: Pt will perform bathing with assistance level/cueing: Contact Guard/Touching assist LTG: Position pt will perform bathing: Shower   Problem: RH Toileting Goal: LTG Patient will perform toileting task (3/3 steps) with assistance level (OT) Description: LTG: Patient will perform toileting task (3/3 steps) with assistance level (OT)  Flowsheets (Taken 01/17/2024 1252) LTG: Pt will perform toileting task (3/3 steps) with assistance level: (LTG upgraded due to progress with L side movement.) Contact Guard/Touching assist Note: LTG upgraded due to progress with L side movement.    Problem: RH Toilet Transfers Goal: LTG Patient will perform toilet transfers w/assist (OT) Description: LTG: Patient will perform toilet transfers with assist, with/without cues using equipment (OT) Flowsheets (Taken 01/17/2024 1252) LTG: Pt will perform toilet transfers with assistance level of: (LTG upgraded due to progress with L side movement.) Supervision/Verbal cueing Note: LTG upgraded due to progress with L side movement.    Problem: RH Tub/Shower Transfers Goal: LTG Patient will perform tub/shower transfers w/assist (OT) Description: LTG: Patient will perform tub/shower transfers with assist, with/without cues using equipment (OT) Flowsheets (Taken 01/17/2024 1252) LTG: Pt will perform tub/shower stall transfers with assistance level of: (LTG upgraded due to progress with L side movement.) Contact Guard/Touching assist LTG: Pt will perform tub/shower transfers from: Tub/shower combination Note: LTG upgraded  due to progress  with L side movement.      OT Short Term Goals Week 1:  OT Short Term Goal 1 (Week 1): Pt will maintain sitting balance on toilet with min A with max cues OT Short Term Goal 1 - Progress (Week 1): Met OT Short Term Goal 2 (Week 1): Pt will don shirt with mod A with cues OT Short Term Goal 2 - Progress (Week 1): Met OT Short Term Goal 3 (Week 1): Pt will attend to left visual field to obtain items for ADL with min questioning cuing OT Short Term Goal 3 - Progress (Week 1): Met OT Short Term Goal 4 (Week 1): Pt will transfer with mod A +1 consistently OT Short Term Goal 4 - Progress (Week 1): Met Week 2:  OT Short Term Goal 1 (Week 2): Pt will transfer to his L side to toilet with min A. OT Short Term Goal 1 - Progress (Week 2): Met OT Short Term Goal 2 (Week 2): Pt will be able to hold static stand during toileting with min A. OT Short Term Goal 2 - Progress (Week 2): Met OT Short Term Goal 3 (Week 2): Pt will don tshirt with min A. OT Short Term Goal 3 - Progress (Week 2): Met OT Short Term Goal 4 (Week 2): Pt will use LUE as a stabilizing A with min A, OT Short Term Goal 4 - Progress (Week 2): Met Week 3:  OT Short Term Goal 1 (Week 3): Pt will bathe self with min A sitting on tub bench. OT Short Term Goal 2 (Week 3): Pt will don shirt with supervision. OT Short Term Goal 3 (Week 3): Pt will start pants over feet with min A. OT Short Term Goal 4 (Week 3): Pt will pull pants over hips with min A to support balance in standing. OT Short Term Goal 5 (Week 3): Pt will lift L arm actively to be able to reach under arm in bathing.   Therapy Documentation Precautions:  Precautions Precautions: Fall Precaution/Restrictions Comments: L hemi, neglect Restrictions Weight Bearing Restrictions Per Provider Order: No   ADL: ADL Eating: Supervision/safety Grooming: Supervision/safety Where Assessed-Grooming: Sitting at sink Upper Body Bathing: Minimal assistance Where  Assessed-Upper Body Bathing: Shower Lower Body Bathing: Moderate assistance Where Assessed-Lower Body Bathing: Shower Upper Body Dressing: Minimal assistance Where Assessed-Upper Body Dressing: Edge of bed Lower Body Dressing: Moderate assistance Where Assessed-Lower Body Dressing: Edge of bed Toileting: Moderate assistance Where Assessed-Toileting: Teacher, adult education: Curator Method: Engineer, water:  (had used stedy to bench, will start to use squat pivots)     Hermes Wafer 01/17/2024, 1:02 PM

## 2024-01-17 NOTE — Progress Notes (Signed)
 PROGRESS NOTE   Subjective/Complaints:  DIscussed freq stools, as well as ETOH cessation  as well as post d/c therapy   ROS: as per HPI. Denies fever, CP, SOB, abd pain, N/V/D/C, or any other complaints at this time.    Objective:   No results found. Recent Labs    01/15/24 0501  WBC 5.8  HGB 12.0*  HCT 36.3*  PLT 93*     Recent Labs    01/15/24 0501 01/17/24 0506  NA 139  --   K 3.4* 3.5  CL 106  --   CO2 24  --   GLUCOSE 85  --   BUN 12  --   CREATININE 0.69  --   CALCIUM  9.5  --           Intake/Output Summary (Last 24 hours) at 01/17/2024 0811 Last data filed at 01/17/2024 0746 Gross per 24 hour  Intake 740 ml  Output --  Net 740 ml         Physical Exam: Vital Signs Blood pressure 108/70, pulse 77, temperature 98.5 F (36.9 C), temperature source Oral, resp. rate 18, height 5' 7 (1.702 m), weight 64 kg, SpO2 99%.    General: No acute distress, sitting up in [facility] Mood and affect are appropriate, pleasant Heart: Regular rate and rhythm no rubs murmurs or extra sounds Lungs: Clear to auscultation, breathing unlabored, no rales or wheezes Abdomen: Positive bowel sounds, soft nontender to palpation, nondistended Extremities: No clubbing, cyanosis, or edema Skin: No evidence of breakdown, no evidence of rash Neuro: Alert and awake, dysarthric, L facial droop, L sided weakness.      PRIOR EXAMS: Neuro: attention a little better, Alert and oriented x3  Left facial droop. Speech remains dysarthric LLE 3- left hip add, 4- Left hip flexion, quad  and distally is 0/5, LUE 2-/5 -->forearm supination , finger flexion Right sided strength is 5/5. Musc: no back pain or hip pain today   Assessment/Plan: 1. Functional deficits which require 3+ hours per day of interdisciplinary therapy in a comprehensive inpatient rehab setting. Physiatrist is providing close team supervision and 24 hour management of  active medical problems listed below. Physiatrist and rehab team continue to assess barriers to discharge/monitor patient progress toward functional and medical goals  Care Tool:  Bathing    Body parts bathed by patient: Left arm, Chest, Abdomen   Body parts bathed by helper: Right arm     Bathing assist Assist Level: Moderate Assistance - Patient 50 - 74%     Upper Body Dressing/Undressing Upper body dressing   What is the patient wearing?: Pull over shirt    Upper body assist Assist Level: Moderate Assistance - Patient 50 - 74%    Lower Body Dressing/Undressing Lower body dressing      What is the patient wearing?: Underwear/pull up, Pants     Lower body assist Assist for lower body dressing: Maximal Assistance - Patient 25 - 49% (bed level)     Toileting Toileting    Toileting assist Assist for toileting: 2 Helpers     Transfers Chair/bed transfer  Transfers assist  Chair/bed transfer activity did not occur: Safety/medical concerns  Chair/bed transfer assist level: Minimal Assistance - Patient > 75%     Locomotion Ambulation   Ambulation assist   Ambulation activity did not occur: Safety/medical concerns  Assist level: Moderate Assistance - Patient 50 - 74% Assistive device: Other (comment) (right hand rail) Max distance: 30   Walk 10 feet activity   Assist  Walk 10 feet activity did not occur: Safety/medical concerns  Assist level: Moderate Assistance - Patient - 50 - 74% Assistive device:  (right hand rail)   Walk 50 feet activity   Assist Walk 50 feet with 2 turns activity did not occur: Safety/medical concerns         Walk 150 feet activity   Assist Walk 150 feet activity did not occur: Safety/medical concerns         Walk 10 feet on uneven surface  activity   Assist Walk 10 feet on uneven surfaces activity did not occur: Safety/medical concerns         Wheelchair     Assist Is the patient using a wheelchair?:  Yes Type of Wheelchair: Manual    Wheelchair assist level: Moderate Assistance - Patient 50 - 74% Max wheelchair distance: 150    Wheelchair 50 feet with 2 turns activity    Assist        Assist Level: Moderate Assistance - Patient 50 - 74%   Wheelchair 150 feet activity     Assist      Assist Level: Moderate Assistance - Patient 50 - 74%   Blood pressure 108/70, pulse 77, temperature 98.5 F (36.9 C), temperature source Oral, resp. rate 18, height 5' 7 (1.702 m), weight 64 kg, SpO2 99%.  Medical Problem List and Plan: 1. Functional deficits secondary to right basal ganglia large ICH 12/16/23             -patient may shower             -ELOS/Goals: 6/17 minA goals  -Continue CIR Team conference today please see physician documentation under team conference tab, met with team  to discuss problems,progress, and goals. Formulized individual treatment plan based on medical history, underlying problem and comorbidities.  - Discussed overall recovery from CVA  - can continue for months   2.  Antithrombotics: -DVT/anticoagulation:  Pharmaceutical: SCDs, heparin  5000U q8h             -antiplatelet therapy: N/A  3. Pain Management: Tylenol  prn. Continue gabapentin  200mg  BID -  Oxycodone  2.5mg  q6h PRN   not using    5/28 having intermittent muscular pain it appears, left hip pain better   -continue above as well as heat/ice/rom/oob  5/30 pain improved. 4. Mood/Behavior/Sleep: LCSW to follow for evaluation and support.              -antipsychotic agents: N/A  -Seroquel    Ativan  0.5mg  q6h PRN, melatonin 5mg  PRN  -5/29-   seroquel  reduced to at bedtime only -5/30 if he does well this weekend, can change seroquel  to at bedtime prn.  He already is much brighter with reduction to HS    -he is also using melatonin prn which we could schedule -01/06/24 will change seroquel  to at bedtime PRN and schedule melatonin at bedtime; pt agreeable -01/07/24 pt slept well, woke up early but  not bothered by that, much more awake this morning-- leave meds as is 5. Neuropsych/cognition: This patient is not capable of making decisions on his own behalf.  6. Skin/Wound Care: Routine pressure relief measures.  7. Fluids/Electrolytes/Nutrition: Monitor I/O. Monitor routine labs. Continue vitamins/supplements.  -Dys 3 diet with thins (SLP to eval)   -Intake improved.  Does better with food from home as well.  -6/9 potassium for hypokalemia Recheck in am  8. Metabolic encephalopathy/ETOH withdrawal: Has resolved, wean seroquel  and ativan  as tolerated. Continue lactulose  30g BID   5/27 ammonia level 23  Will stop lactulose  due to hypokalemia, recheck serum ammonia Monday , monitor for MS changes  9. Abnormal LFTs:  AST/ALT trending down.    -Improved 6/9   10. ABLA?: Hgb 12's, monitor Hgb weekly   11. HTN: no meds PTA, started on amlodipine  10mg  daily, coreg  25mg  BID, losartan  100mg  daily. SBP goal <160 6/8-9 controlled   Vitals:   01/13/24 1302 01/13/24 1943 01/14/24 0540 01/14/24 1339  BP: 122/84 115/66 104/68 105/77   01/14/24 2003 01/15/24 0541 01/15/24 1940 01/15/24 2024  BP: 111/77 106/64 111/73 116/72   01/16/24 0516 01/16/24 1413 01/16/24 2031 01/17/24 0642  BP: (!) 96/58 98/63 115/87 108/70      12. Tobacco abuse: continue nicoderm patches.   13. GI ppx: continue Protonix  40mg  daily  14. Bowel management: continue Miralax  daily  -  LBM 5/29 , is now continent  -5/30 stools liquidy---back off miralax  -01/06/24 LBM this morning, not sure what type since not documented; monitor for now -01/15/24 LBM yesterday, continue to follow  15. HLD: LDL 124, goal LDL <70, per d/c summary, start atorvastatin  40mg  once LFTs downtrend and repeat in 1 week -5/27 LFT's better yesterday -resumed atorvastatin    -LFTs cont to improved 6/2  LOS: 18 days A FACE TO FACE EVALUATION WAS PERFORMED  Genetta Kenning 01/17/2024, 8:11 AM

## 2024-01-17 NOTE — Progress Notes (Signed)
 Physical Therapy Session Note  Patient Details  Name: Troy Bishop MRN: [id number] Date of Birth: [birth year]  Today's Date: 01/17/2024 PT Individual Time: 0915-1000 PT Individual Time Calculation (min): 45 min   Short Term Goals: Week 2:  PT Short Term Goal 1 (Week 2): Pt will maintain static stand balance w/ Mod A to maintain L knee extension PT Short Term Goal 2 (Week 2): Pt will perform stand pivot transfer w/ mod A w/ LRAD PT Short Term Goal 3 (Week 2): Pt will initiate w/c mobility PT Short Term Goal 4 (Week 2): PT to initiate AFO consult.  Skilled Therapeutic Interventions/Progress Updates: Patient sitting in [facility] in main gym from OT session. Patient alert and agreeable to PT session.   Patient with no complaints of pain  Therapeutic Activity: Transfers: Pt performed sit<>stand pivot transfers from edge of mat<>WC x 4 with RW and overall CGA/light minA. Pt with increased weight shift to R and L knee in slight flexion. Pt required VC to increase retro-step (pt with decrease L glute/knee flexor activation to self assist.   Gait Training:  Pt ambulated roughly 200' x 1, and roughly 100' x 1 (end of session) using RW with overall CGA/light minA (brief moment of anterior LOB) with pt continuing to improve with quad activation during stance (pt now with PLS AFO donned vs GRAFO). Pt with decreased clearance and hip flexion on L LE vs R, but able to clear floor enough as to not catch front of shoe.   Neuromuscular Re-ed: NMR facilitated during session with focus on coordination, NM connection L LE. - in // bars performing retro-steps with L LE with tech pulling anteriorly. Pt with decreased ability to do so without hip compensation (forward flexion) with PTA providing minA to maintain pt in upright position at trunk. Pt with slight improvement after multiple reps. Pt then cued to perform retro-steps/anterior steps in // bars with cues to increase retro step L LE (band still in use at ankle, then  moved proximally to back of knee with cues for pt to increase knee extension during stance phase when stepping forward - decreased motor planning to do so at this time).  NMR performed for improvements in motor control and coordination, balance, sequencing, judgement, and self confidence/ efficacy in performing all aspects of mobility at highest level of independence.   Patient sitting in recliner at end of session with brakes locked, wife present, belt alarm set, and all needs within reach.      Therapy Documentation Precautions:  Precautions Precautions: Fall Precaution/Restrictions Comments: L hemi, neglect Restrictions Weight Bearing Restrictions Per Provider Order: No  Therapy/Group: Individual Therapy  Coreon Simkins PTA 01/17/2024, 12:15 PM

## 2024-01-17 NOTE — Progress Notes (Signed)
 Speech Language Pathology Daily Session Note  Patient Details  Name: Troy Bishop MRN: [id number] Date of Birth: 1991-03-08  Today's Date: 01/17/2024 SLP Individual Time: 1105-1200 SLP Individual Time Calculation (min): 55 min  Short Term Goals: Week 3: SLP Short Term Goal 1 (Week 3): STG = LTG due to elos  Skilled Therapeutic Interventions: Skilled therapy session focused on cognitive and dysarthria goals. SLP facilitated session by prompting patient to recall activities completed in tx this Am. Patient did so independently. SLP challenged patients problem solving and L attention through menu and simple addition task. Patient required supervisionA to attend to menu items on the L and minA to complete basic addition. SLP targeted dysarthria goals through use of IOPI ([place]  Oral Performance Instrument). Patient completed x20 repetitions anteriorly at 35kpa and posteriorly at 25kpa with supervision A. Patient left in [facility] with alarm set and call bell in reach. Continue POC  Pain None reported   Therapy/Group: Individual Therapy  [name] M.A., CCC-SLP 01/17/2024, 7:38 AM

## 2024-01-17 NOTE — Plan of Care (Signed)
  Problem: Consults Goal: RH STROKE PATIENT EDUCATION Description: See Patient Education module for education specifics  01/17/2024 0037 by Ayesha Bold, RN Outcome: Progressing 01/16/2024 2247 by Ayesha Bold, RN Outcome: Progressing   Problem: RH BOWEL ELIMINATION Goal: RH STG MANAGE BOWEL WITH ASSISTANCE Description: STG Manage Bowel with supervision Assistance. 01/17/2024 0037 by Ayesha Bold, RN Outcome: Progressing 01/16/2024 2247 by Ayesha Bold, RN Outcome: Progressing   Problem: RH BLADDER ELIMINATION Goal: RH STG MANAGE BLADDER WITH ASSISTANCE Description: STG Manage Bladder With  supervision Assistance 01/17/2024 0037 by Ayesha Bold, RN Outcome: Progressing 01/16/2024 2247 by Ayesha Bold, RN Outcome: Progressing   Problem: RH SKIN INTEGRITY Goal: RH STG SKIN FREE OF INFECTION/BREAKDOWN Description: Manage skin free of infection/breakdown with supervision assistance 01/17/2024 0037 by Ayesha Bold, RN Outcome: Progressing 01/16/2024 2247 by Ayesha Bold, RN Outcome: Progressing   Problem: RH SAFETY Goal: RH STG ADHERE TO SAFETY PRECAUTIONS W/ASSISTANCE/DEVICE Description: STG Adhere to Safety Precautions With supervision  Assistance/Device. 01/17/2024 0037 by Ayesha Bold, RN Outcome: Progressing 01/16/2024 2247 by Ayesha Bold, RN Outcome: Progressing   Problem: RH PAIN MANAGEMENT Goal: RH STG PAIN MANAGED AT OR BELOW PT'S PAIN GOAL Description: < 4 w/prns 01/17/2024 0037 by Ayesha Bold, RN Outcome: Progressing 01/16/2024 2247 by Ayesha Bold, RN Outcome: Progressing   Problem: RH KNOWLEDGE DEFICIT Goal: RH STG INCREASE KNOWLEDGE OF HYPERTENSION Description: Manage increase knowledge of hypertension with supervision assistance from wife using educational materials provided 01/17/2024 0037 by Ayesha Bold, RN Outcome: Progressing 01/16/2024 2247 by Ayesha Bold, RN Outcome: Progressing Goal: RH STG INCREASE KNOWLEDGE OF DYSPHAGIA/FLUID  INTAKE 01/17/2024 0037 by Ayesha Bold, RN Outcome: Progressing 01/16/2024 2247 by Ayesha Bold, RN Outcome: Progressing Goal: RH STG INCREASE KNOWLEGDE OF HYPERLIPIDEMIA Description: Manage increase knowledge of hyperlipidemia  with supervision assistance from wife using educational materials provided 01/17/2024 0037 by Ayesha Bold, RN Outcome: Progressing 01/16/2024 2247 by Ayesha Bold, RN Outcome: Progressing Goal: RH STG INCREASE KNOWLEDGE OF STROKE PROPHYLAXIS Description: Manage increase knowledge of stroke prophylaxis with supervision assistance from wife using educational materials provided 01/17/2024 0037 by Ayesha Bold, RN Outcome: Progressing 01/16/2024 2247 by Ayesha Bold, RN Outcome: Progressing   Problem: RH Vision Goal: RH LTG Vision (Specify) 01/17/2024 0037 by Ayesha Bold, RN Outcome: Progressing 01/16/2024 2247 by Ayesha Bold, RN Outcome: Progressing

## 2024-01-17 NOTE — Progress Notes (Signed)
 Occupational Therapy Session Note  Patient Details  Name: Troy Bishop MRN: [id number] Date of Birth: June 03, 1991  Today's Date: 01/17/2024 OT Individual Time: [phone] (unattended estim 60 min [id number]) OT Individual Time Calculation (min): 45 min    Short Term Goals: Week 1:  OT Short Term Goal 1 (Week 1): Pt will maintain sitting balance on toilet with min A with max cues OT Short Term Goal 1 - Progress (Week 1): Met OT Short Term Goal 2 (Week 1): Pt will don shirt with mod A with cues OT Short Term Goal 2 - Progress (Week 1): Met OT Short Term Goal 3 (Week 1): Pt will attend to left visual field to obtain items for ADL with min questioning cuing OT Short Term Goal 3 - Progress (Week 1): Met OT Short Term Goal 4 (Week 1): Pt will transfer with mod A +1 consistently OT Short Term Goal 4 - Progress (Week 1): Met Week 2:  OT Short Term Goal 1 (Week 2): Pt will transfer to his L side to toilet with min A. OT Short Term Goal 2 (Week 2): Pt will be able to hold static stand during toileting with min A. OT Short Term Goal 3 (Week 2): Pt will don tshirt with min A. OT Short Term Goal 4 (Week 2): Pt will use LUE as a stabilizing A with min A,  Skilled Therapeutic Interventions/Progress Updates:    Pt received sitting in bed leaning on R rails with wife in the room. Had pt scoot to center of bed to position self to safely sit to EOB.    From EOB (with +2 support A from rehab tech to guard pt in case of LOB) pt worked on 12 sit to stands with fast repetitions for cardio challenge with CGA and guard L knee, standing balance with L hand on RW hand splint with using R hand to simulate managing clothing and actually adjusting pants, reaching down to R knee with min A to support balance.  Pt sat down and completed sq pivot to L in 2 increments with only CGA and excellent recall of steps. During transitions of movement cued pt to try to actively move L arm onto his lap,  off his lap, off of hand rest, etc.  Pt able to do so with guiding assist.   Pt's wife attending therapies and will help with cuing when needed. Pt taken to gym to work on standing at hi low table with OT guarding L knee on L side as rehab tech guarded his balance.  Pt worked on gravity eliminated shoulder movement with hand on [employer]. Pt worked on actively moving hand around table with cues to pull back his arm using his back muscles.  Integrated L to R wt shifting.   Once pt became fatigued, he sat down in wc and worked on grasping 1 inch pegs.  He has improved finger flexion for loose grasp but unable to open fingers. Applied estim for finger extension to use during active grasp and release training.   He continues to have a 1 finger with subluxation. Removed k tap to apply estim to deltoids.  Had a good response on intensity 24.  Left on his shoulder for unattended estim for 60 min.  Once doffed no adverse response.    Pt returned to room with all needs met and alarm set.    Therapy Documentation Precautions:  Precautions Precautions: Fall Precaution/Restrictions Comments: L hemi, neglect Restrictions Weight Bearing Restrictions Per Provider Order: No  Vital Signs: Therapy Vitals Temp: 98.5 F (36.9 C) Temp Source: Oral Pulse Rate: 77 Resp: 18 BP: 108/70 Patient Position (if appropriate): Lying Oxygen Therapy SpO2: 99 % O2 Device: Room Air Pain: Pain Assessment Pain Score: 0-No pain    Therapy/Group: Individual Therapy  Troy Bishop 01/17/2024, 10:15 AM

## 2024-01-18 DIAGNOSIS — R252 Cramp and spasm: Secondary | ICD-10-CM

## 2024-01-18 NOTE — Plan of Care (Signed)
  Problem: Consults Goal: RH STROKE PATIENT EDUCATION Description: See Patient Education module for education specifics  Outcome: Progressing   Problem: RH BOWEL ELIMINATION Goal: RH STG MANAGE BOWEL WITH ASSISTANCE Description: STG Manage Bowel with supervision Assistance. Outcome: Progressing   Problem: RH BLADDER ELIMINATION Goal: RH STG MANAGE BLADDER WITH ASSISTANCE Description: STG Manage Bladder With  supervision Assistance Outcome: Progressing   Problem: RH SKIN INTEGRITY Goal: RH STG SKIN FREE OF INFECTION/BREAKDOWN Description: Manage skin free of infection/breakdown with supervision assistance Outcome: Progressing   Problem: RH SAFETY Goal: RH STG ADHERE TO SAFETY PRECAUTIONS W/ASSISTANCE/DEVICE Description: STG Adhere to Safety Precautions With supervision  Assistance/Device. Outcome: Progressing   Problem: RH PAIN MANAGEMENT Goal: RH STG PAIN MANAGED AT OR BELOW PT'S PAIN GOAL Description: < 4 w/prns Outcome: Progressing   Problem: RH KNOWLEDGE DEFICIT Goal: RH STG INCREASE KNOWLEDGE OF HYPERTENSION Description: Manage increase knowledge of hypertension with supervision assistance from wife using educational materials provided Outcome: Progressing Goal: RH STG INCREASE KNOWLEDGE OF DYSPHAGIA/FLUID INTAKE Outcome: Progressing Goal: RH STG INCREASE KNOWLEGDE OF HYPERLIPIDEMIA Description: Manage increase knowledge of hyperlipidemia  with supervision assistance from wife using educational materials provided Outcome: Progressing Goal: RH STG INCREASE KNOWLEDGE OF STROKE PROPHYLAXIS Description: Manage increase knowledge of stroke prophylaxis with supervision assistance from wife using educational materials provided Outcome: Progressing   Problem: RH Vision Goal: RH LTG Vision (Specify) Outcome: Progressing

## 2024-01-18 NOTE — Progress Notes (Addendum)
 Physical Therapy Session Note  Patient Details  Name: Troy Bishop MRN: [id number] Date of Birth: 02-23-1991  Today's Date: 01/18/2024 PT Individual Time: 1134-1201 PT Individual Time Calculation (min): 27 min   Short Term Goals: Week 2:  PT Short Term Goal 1 (Week 2): Pt will maintain static stand balance w/ Mod A to maintain L knee extension PT Short Term Goal 2 (Week 2): Pt will perform stand pivot transfer w/ mod A w/ LRAD PT Short Term Goal 3 (Week 2): Pt will initiate w/c mobility PT Short Term Goal 4 (Week 2): PT to initiate AFO consult.  Skilled Therapeutic Interventions/Progress Updates: Patient sitting in WC on entrance to room. Patient alert and agreeable to PT session.   Patient with no complaints of pain. Pt ambulated from room<day room gym in RW with minA/light modA (moment of maxA to prevent L LOB due to poor awareness of L LE as foot was placed outside of RW when turning to L without pt acknowledging). Pt with decreased stance time on L LE with cues to increase step length on R throughout. Pt also cued to increase step clearance on L LE while looking forward ahead vs at ground. Pt with seated rest break and ambulated 3/4 distance around nsg/day room loop (roughly 130') without AD and with R HHA and overall modA with pt presenting with heavy L lean with cues to adjust weight back to R/center prior to advancing L LE to avoid anterior LOB. Pt with seated rest break provided halfway through to re-adjust as pt started to require increased cuing to increase awareness to heavy L lean. Pt with narrow BOS and L LE rotating into external rotation during swing with cues to increase internal rotation for neutral step placement (able to do so once L foot at heel-strike). Pt participated in coordinating L LE to R/L of orange cone with cues to tap heel to floor and to readjust in [facility] when starting to slide back to increase ability to tap foot to ground. Pt with decreased coordination/motor control of L  LE to perform without min/modA, and added cuing to decrease speed of movement to focus on controlled contractions. Pt wheeled back to room in [facility] at end of session.   Patient sitting in WC at end of session with brakes locked, wife present, belt alarm set, and all needs within reach.      Therapy Documentation Precautions:  Precautions Precautions: Fall Precaution/Restrictions Comments: L hemi, neglect Restrictions Weight Bearing Restrictions Per Provider Order: No  Therapy/Group: Individual Therapy  Cassadee Vanzandt PTA 01/18/2024, 12:41 PM

## 2024-01-18 NOTE — Progress Notes (Signed)
 Occupational Therapy Session Note  Patient Details  Name: Kree Rafter MRN: [id number] Date of Birth: 1991/02/14  Today's Date: 01/18/2024 OT Individual Time: 0832-0904+1347-1504 OT Individual Time Calculation (min): 32 min    Short Term Goals: Week 3:  OT Short Term Goal 1 (Week 3): Pt will bathe self with min A sitting on tub bench. OT Short Term Goal 2 (Week 3): Pt will don shirt with supervision. OT Short Term Goal 3 (Week 3): Pt will start pants over feet with min A. OT Short Term Goal 4 (Week 3): Pt will pull pants over hips with min A to support balance in standing. OT Short Term Goal 5 (Week 3): Pt will lift L arm actively to be able to reach under arm in bathing.  Skilled Therapeutic Interventions/Progress Updates:  Session 1: Pt greeted supine in bed, pt agreeable to OT intervention.      Transfers/bed mobility/functional mobility:  Pt completed supine>sit with CGA. Pt completed stand pivot to w/c with MINA, pt did need assistance to advance LLE during transfer.    NMR: pt completed closed chain exercises with pt instructed to push wash cloth forward/backwards with a focus on activating scapular protraction/retraction and elbow flexion/extension and improving tricep activation when extending elbow. Pt completed task with MIN support at L elbow.  Graded task up and donned zynex NMES to L triceps to facilitate improved activation of triceps during closed chain tasks. Increased intensity to level 20, pt tolerated treatment with no adverse reactions.                 Ended session with pt seated in w/c with all needs within reach and safety belt alarm activated.                          Session 2: Pt greeted seated in w/c, pt agreeable to OT intervention.      Transfers/bed mobility/functional mobility:  Pt completed functional ambulation greater than a household distance with L saddle splint on RW and CGA, pts wife followed behind with w/c.   ADLs:  UB dressing:pt donned  button up shirt with MIN A d/t buttons LB dressing: pt donned pants with MINA needing assistance to thread LLE first, pt assisted with pulling pants to waist on R side while standing with bed rail on R side, wife provided assistance with this task for pt privacy.  Footwear: donned shoes/socks and AFO with total A.   Bathing: pt completed bathing seated on shower seat with CGA.  Transfers: stand pivot transfer into walk in shower to R and L side with MINA.   Education:  Informal family education provided to pts wife on the below topics:  Of note pt/wife are unsure if w/c will fit into bathroom,therefore educated pt and wife on ambulatory ADL transfers.   -always using gait belt for functional mobility -recommendation on use of RW for functional ambulation with L saddle splint - education provided that anytime pt is walking in the home pt needs to have AFO - general assist needed for ADLS and functional mobility ( I.e MINA- CGA) - wife assisted pt with ambulatory shower transfer to TTB with RW with great carryover, education provided on shower safety and recommendation of completing lateral leans for pericare as pt has no grab bars at home -educated wife on tucking shower curtain under buttock to decrease water output -wife also demonstrated ability to assist pt with ambulatory transfers to flat [facility], and toilet. Educated  pt and wife on recommendation of rolling Rw over top of toilet to allow for BUE support during toileting.   Family demonstrated appropriate level of assist with ADLs and functional mobility    Ended session with pt seated in w/c with all needs within reach and wife present.             Therapy Documentation Precautions:  Precautions Precautions: Fall Precaution/Restrictions Comments: L hemi, neglect Restrictions Weight Bearing Restrictions Per Provider Order: No  Pain: no pain reported during either session     Therapy/Group: Individual Therapy  Mollie Anger  [facility] 01/18/2024, 12:05 PM

## 2024-01-18 NOTE — Plan of Care (Signed)
  Problem: Consults Goal: RH STROKE PATIENT EDUCATION Description: See Patient Education module for education specifics  Outcome: Progressing   Problem: RH BOWEL ELIMINATION Goal: RH STG MANAGE BOWEL WITH ASSISTANCE Description: STG Manage Bowel with  supervision Assistance. Outcome: Progressing   Problem: RH BLADDER ELIMINATION Goal: RH STG MANAGE BLADDER WITH ASSISTANCE Description: STG Manage Bladder With supervision Assistance Outcome: Progressing   Problem: RH SKIN INTEGRITY Goal: RH STG SKIN FREE OF INFECTION/BREAKDOWN Description: Manage skin free of infection/breakdown with supervision assistance Outcome: Progressing   Problem: RH SAFETY Goal: RH STG ADHERE TO SAFETY PRECAUTIONS W/ASSISTANCE/DEVICE Description: STG Adhere to Safety Precautions With supervision Assistance/Device. Outcome: Progressing

## 2024-01-18 NOTE — Progress Notes (Addendum)
 PROGRESS NOTE   Subjective/Complaints:  Pt in good spirits. Had questions about his left eye/vision. No pain. Making progress in therapies  ROS: Patient denies fever, rash, sore throat, blurred vision, dizziness, nausea, vomiting, diarrhea, cough, shortness of breath or chest pain, joint or back/neck pain, headache, or mood change.   Objective:   No results found. No results for input(s): WBC, HGB, HCT, PLT in the last 72 hours.    Recent Labs    01/17/24 0506  K 3.5          Intake/Output Summary (Last 24 hours) at 01/18/2024 0957 Last data filed at 01/17/2024 1800 Gross per 24 hour  Intake 480 ml  Output --  Net 480 ml         Physical Exam: Vital Signs Blood pressure 101/62, pulse 71, temperature 98 F (36.7 C), resp. rate 18, height 5' 7 (1.702 m), weight 65.2 kg, SpO2 100%.    Constitutional: No distress . Vital signs reviewed. HEENT: NCAT, EOMI, oral membranes moist Neck: supple Cardiovascular: RRR without murmur. No JVD    Respiratory/Chest: CTA Bilaterally without wheezes or rales. Normal effort    GI/Abdomen: BS +, non-tender, non-distended Ext: no clubbing, cyanosis, or edema Psych: pleasant and cooperative  Skin: No evidence of breakdown, no evidence of rash Neuro: Alert and awake, dysarthric, L facial droop, L sided weakness.   Neuro: attention improving, Alert and oriented x3  Left facial droop. Speech remains dysarthric LLE 3- left hip add, 4- Left hip flexion, quad  and distally is 0/5, LUE 2- to 2/5 -->forearm supination , finger flexion, 1/5 wrist/finger extension. Right sided strength is 5/5. Hyperreflexic LLE, 3-4 beats of clonus at ankle Musc: no back pain or hip pain today. Normal ROM   Assessment/Plan: 1. Functional deficits which require 3+ hours per day of interdisciplinary therapy in a comprehensive inpatient rehab setting. Physiatrist is providing close team  supervision and 24 hour management of active medical problems listed below. Physiatrist and rehab team continue to assess barriers to discharge/monitor patient progress toward functional and medical goals  Care Tool:  Bathing    Body parts bathed by patient: Left arm, Chest, Abdomen   Body parts bathed by helper: Right arm     Bathing assist Assist Level: Moderate Assistance - Patient 50 - 74%     Upper Body Dressing/Undressing Upper body dressing   What is the patient wearing?: Pull over shirt    Upper body assist Assist Level: Moderate Assistance - Patient 50 - 74%    Lower Body Dressing/Undressing Lower body dressing      What is the patient wearing?: Underwear/pull up, Pants     Lower body assist Assist for lower body dressing: Maximal Assistance - Patient 25 - 49% (bed level)     Toileting Toileting    Toileting assist Assist for toileting: 2 Helpers     Transfers Chair/bed transfer  Transfers assist  Chair/bed transfer activity did not occur: Safety/medical concerns  Chair/bed transfer assist level: Minimal Assistance - Patient > 75%     Locomotion Ambulation   Ambulation assist   Ambulation activity did not occur: Safety/medical concerns  Assist level: Minimal Assistance - Patient >  75% Assistive device: Walker-rolling Max distance: 90   Walk 10 feet activity   Assist  Walk 10 feet activity did not occur: Safety/medical concerns  Assist level: Minimal Assistance - Patient > 75% Assistive device: Walker-rolling   Walk 50 feet activity   Assist Walk 50 feet with 2 turns activity did not occur: Safety/medical concerns  Assist level: Minimal Assistance - Patient > 75% Assistive device: Walker-rolling    Walk 150 feet activity   Assist Walk 150 feet activity did not occur: Safety/medical concerns         Walk 10 feet on uneven surface  activity   Assist Walk 10 feet on uneven surfaces activity did not occur: Safety/medical  concerns         Wheelchair     Assist Is the patient using a wheelchair?: Yes Type of Wheelchair: Manual    Wheelchair assist level: Moderate Assistance - Patient 50 - 74% Max wheelchair distance: 150    Wheelchair 50 feet with 2 turns activity    Assist        Assist Level: Moderate Assistance - Patient 50 - 74%   Wheelchair 150 feet activity     Assist      Assist Level: Moderate Assistance - Patient 50 - 74%   Blood pressure 101/62, pulse 71, temperature 98 F (36.7 C), resp. rate 18, height 5' 7 (1.702 m), weight 65.2 kg, SpO2 100%.  Medical Problem List and Plan: 1. Functional deficits secondary to right basal ganglia large ICH 12/16/23             -patient may shower             -ELOS/Goals: 6/17 minA goals  -Continue CIR therapies including PT, OT, and SLP  -Again we discussed overall recovery from CVA  -reviewed potential mgt of his vision moving forward including formal eye exam at some point over next 3-6 mos   2.  Antithrombotics: -DVT/anticoagulation:  Pharmaceutical: SCDs, heparin  5000U q8h             -antiplatelet therapy: N/A  3. Pain Management: Tylenol  prn. Continue gabapentin  200mg  BID -  Oxycodone  2.5mg  q6h PRN   not using    5/28 having intermittent muscular pain it appears, left hip pain better   -continue above as well as heat/ice/rom/oob  6/12 pain resolved     -pt has clonus and is hyperreflexic LLE but ROM appears preserved   -continue to monitor 4. Mood/Behavior/Sleep: LCSW to follow for evaluation and support.              -antipsychotic agents: N/A  -down to melatonin only at bedtime. Sleeping well. Very alert in am 5. Neuropsych/cognition: This patient is not capable of making decisions on his own behalf.  6. Skin/Wound Care: Routine pressure relief measures.   7. Fluids/Electrolytes/Nutrition: Monitor I/O. Monitor routine labs. Continue vitamins/supplements.  -Dys 3 diet with thins (SLP to eval)   -Intake  improved.  Does better with food from home as well.  -6/12  most recent potassium 3.5--recheck Monday    -encourage appropriate dietary intake 8. Metabolic encephalopathy/ETOH withdrawal: Has resolved, wean seroquel  and ativan  as tolerated. Continue lactulose  30g BID   5/27 ammonia level 23  Will stop lactulose  due to hypokalemia, recheck serum ammonia Monday , monitor for MS changes  9. Abnormal LFTs:  AST/ALT trending down.    -Improved     10. ABLA?: Hgb 12's, monitor Hgb weekly   11. HTN: no meds PTA, started  on amlodipine  10mg  daily, coreg  25mg  BID, losartan  100mg  daily. SBP goal <160 6/12 bp well controlled   Vitals:   01/14/24 1339 01/14/24 2003 01/15/24 0541 01/15/24 1940  BP: 105/77 111/77 106/64 111/73   01/15/24 2024 01/16/24 0516 01/16/24 1413 01/16/24 2031  BP: 116/72 (!) 96/58 98/63 115/87   01/17/24 0642 01/17/24 1256 01/17/24 2032 01/18/24 0640  BP: 108/70 104/69 107/66 101/62      12. Tobacco abuse: continue nicoderm patches.   13. GI ppx: continue Protonix  40mg  daily  14. Bowel management: continue Miralax  daily  -  LBM 5/29 , is now continent  -5/30 stools liquidy---back off miralax  -01/06/24 LBM this morning, not sure what type since not documented; monitor for now -LBM 6/11  15. HLD: LDL 124, goal LDL <70, per d/c summary, start atorvastatin  40mg  once LFTs downtrend and repeat in 1 week -5/27 LFT's better yesterday -resumed atorvastatin    -LFTs cont to be improved as of 6/9  LOS: 19 days A FACE TO FACE EVALUATION WAS PERFORMED  Rawland Caddy 01/18/2024, 9:57 AM

## 2024-01-18 NOTE — Progress Notes (Signed)
 Speech Language Pathology Daily Session Note  Patient Details  Name: Troy Bishop MRN: [id number] Date of Birth: 09-21-1990  Today's Date: 01/18/2024 SLP Individual Time: 1106-1202 SLP Individual Time Calculation (min): 56 min  Short Term Goals: Week 3: SLP Short Term Goal 1 (Week 3): STG = LTG due to elos  Skilled Therapeutic Interventions: Skilled therapy session focused on cognitive and dysarthria goals. SLP targeted dysarthria through lingual strengthening exercises. Patient completed x30 repetitions of IOPI anteriorly at 40kpa and posteriorly at 35kpa. SLP targeted cognitive goals through completion of portions of the Cognistat standardized assessment. Patient with improvements in cognitive skills since evaluation, however with continued deficits in the subtests of attention, memory and executive functioning. Patient left in [facility] with alarm set and call bell in reach. Continue POC.   Pain None reported   Therapy/Group: Individual Therapy  Xadrian Craighead M.A., CCC-SLP 01/18/2024, 7:52 AM

## 2024-01-19 NOTE — Progress Notes (Signed)
 PROGRESS NOTE   Subjective/Complaints:  Up at eob. No problems overnight. Denies any pain.   ROS: Patient denies fever, rash, sore throat, blurred vision, dizziness, nausea, vomiting, diarrhea, cough, shortness of breath or chest pain, joint or back/neck pain, headache, or mood change.   Objective:   No results found. No results for input(s): WBC, HGB, HCT, PLT in the last 72 hours.    Recent Labs    01/17/24 0506  K 3.5          Intake/Output Summary (Last 24 hours) at 01/19/2024 0938 Last data filed at 01/19/2024 1610 Gross per 24 hour  Intake 636 ml  Output 875 ml  Net -239 ml         Physical Exam: Vital Signs Blood pressure 107/70, pulse 71, temperature 98.1 F (36.7 C), resp. rate 18, height 5' 7 (1.702 m), weight 66 kg, SpO2 100%.    Constitutional: No distress . Vital signs reviewed. HEENT: NCAT, EOMI, oral membranes moist Neck: supple Cardiovascular: RRR without murmur. No JVD    Respiratory/Chest: CTA Bilaterally without wheezes or rales. Normal effort    GI/Abdomen: BS +, non-tender, non-distended Ext: no clubbing, cyanosis, or edema Psych: pleasant and cooperative  Skin: No evidence of breakdown, no evidence of rash Neuro: Alert and awake, dysarthric, L facial droop, L sided weakness.   Neuro: attention can come and go. Sometimes needs to be redirected, Alert and oriented x3  Left facial droop. Speech remains somewhat dysarthric LLE 3- left hip add, 4- Left hip flexion, quad  and distally is 0/5, LUE 2- to 2/5 -->forearm supination , finger flexion, 1/5 wrist/finger extension. Right sided strength is 5/5. DTR's 3+ LLE, 4 beats of clonus at ankle. Good sitting balance Musc: no back pain or hip pain today. Normal ROM   Assessment/Plan: 1. Functional deficits which require 3+ hours per day of interdisciplinary therapy in a comprehensive inpatient rehab setting. Physiatrist is  providing close team supervision and 24 hour management of active medical problems listed below. Physiatrist and rehab team continue to assess barriers to discharge/monitor patient progress toward functional and medical goals  Care Tool:  Bathing    Body parts bathed by patient: Left arm, Chest, Abdomen   Body parts bathed by helper: Right arm     Bathing assist Assist Level: Contact Guard/Touching assist     Upper Body Dressing/Undressing Upper body dressing   What is the patient wearing?: Button up shirt    Upper body assist Assist Level: Minimal Assistance - Patient > 75%    Lower Body Dressing/Undressing Lower body dressing      What is the patient wearing?: Underwear/pull up, Pants     Lower body assist Assist for lower body dressing: Minimal Assistance - Patient > 75%     Toileting Toileting    Toileting assist Assist for toileting: Contact Guard/Touching assist     Transfers Chair/bed transfer  Transfers assist  Chair/bed transfer activity did not occur: Safety/medical concerns  Chair/bed transfer assist level: Contact Guard/Touching assist     Locomotion Ambulation   Ambulation assist   Ambulation activity did not occur: Safety/medical concerns  Assist level: Minimal Assistance - Patient > 75%  Assistive device: Walker-rolling Max distance: 90   Walk 10 feet activity   Assist  Walk 10 feet activity did not occur: Safety/medical concerns  Assist level: Minimal Assistance - Patient > 75% Assistive device: Walker-rolling   Walk 50 feet activity   Assist Walk 50 feet with 2 turns activity did not occur: Safety/medical concerns  Assist level: Minimal Assistance - Patient > 75% Assistive device: Walker-rolling    Walk 150 feet activity   Assist Walk 150 feet activity did not occur: Safety/medical concerns         Walk 10 feet on uneven surface  activity   Assist Walk 10 feet on uneven surfaces activity did not occur:  Safety/medical concerns         Wheelchair     Assist Is the patient using a wheelchair?: Yes Type of Wheelchair: Manual    Wheelchair assist level: Moderate Assistance - Patient 50 - 74% Max wheelchair distance: 150    Wheelchair 50 feet with 2 turns activity    Assist        Assist Level: Moderate Assistance - Patient 50 - 74%   Wheelchair 150 feet activity     Assist      Assist Level: Moderate Assistance - Patient 50 - 74%   Blood pressure 107/70, pulse 71, temperature 98.1 F (36.7 C), resp. rate 18, height 5' 7 (1.702 m), weight 66 kg, SpO2 100%.  Medical Problem List and Plan: 1. Functional deficits secondary to right basal ganglia large ICH 12/16/23             -patient may shower             -ELOS/Goals: 6/17 minA goals  --Continue CIR therapies including PT, OT, and SLP    2.  Antithrombotics: -DVT/anticoagulation:  Pharmaceutical: SCDs, heparin  5000U q8h             -antiplatelet therapy: N/A  3. Pain Management/spasticity: Tylenol  prn. Continue gabapentin  200mg  BID -  Oxycodone  2.5mg  q6h PRN   not using    5/28 having intermittent muscular pain it appears, left hip pain better   -continue above as well as heat/ice/rom/oob  6/13 pain controlled     -pt has clonus and is hyperreflexic LLE but ROM appears preserved   -continue to monitor 4. Mood/Behavior/Sleep: LCSW to follow for evaluation and support.              -antipsychotic agents: N/A  -down to melatonin only at bedtime. Sleeping well. Very alert in am 5. Neuropsych/cognition: This patient is not capable of making decisions on his own behalf.  6. Skin/Wound Care: Routine pressure relief measures.   7. Fluids/Electrolytes/Nutrition: Monitor I/O. Monitor routine labs. Continue vitamins/supplements.  -Dys 3 diet with thins (SLP to eval)   -Intake improved.  Does better with food from home as well.  -6/13  most recent potassium 3.5--recheck Monday    -he's eating very well 100% 8.  Metabolic encephalopathy/ETOH withdrawal: Has resolved, wean seroquel  and ativan  as tolerated. Continue lactulose  30g BID   5/27 ammonia level 23  Will stop lactulose  due to hypokalemia, recheck serum ammonia Monday , monitor for MS changes  9. Abnormal LFTs:  AST/ALT trending down.    -Improved     10. ABLA?: Hgb 12's, monitor Hgb weekly   11. HTN: no meds PTA, started on amlodipine  10mg  daily, coreg  25mg  BID, losartan  100mg  daily. SBP goal <160 6/13 bp well controlled   Vitals:   01/15/24 1940 01/15/24 2024 01/16/24  4098 01/16/24 1413  BP: 111/73 116/72 (!) 96/58 98/63   01/16/24 2031 01/17/24 0642 01/17/24 1256 01/17/24 2032  BP: 115/87 108/70 104/69 107/66   01/18/24 0640 01/18/24 1323 01/18/24 2039 01/19/24 0654  BP: 101/62 107/68 117/78 107/70      12. Tobacco abuse: continue nicoderm patches.   13. GI ppx: continue Protonix  40mg  daily  14. Bowel management: continue Miralax  daily  -  LBM 5/29 , is now continent  -5/30 stools liquidy---back off miralax  -01/06/24 LBM this morning, not sure what type since not documented; monitor for now -LBM 6/11--needs another soon  15. HLD: LDL 124, goal LDL <70, per d/c summary, start atorvastatin  40mg  once LFTs downtrend and repeat in 1 week -5/27 LFT's better yesterday -resumed atorvastatin    -LFTs cont to be improved as of 6/9  LOS: 20 days A FACE TO FACE EVALUATION WAS PERFORMED  Rawland Caddy 01/19/2024, 9:38 AM

## 2024-01-19 NOTE — Progress Notes (Signed)
 Physical Therapy Session Note  Patient Details  Name: Troy Bishop MRN: [id number] Date of Birth: 1991/03/17  Today's Date: 01/19/2024 PT Individual Time: 1350-1500 PT Individual Time Calculation (min): 70 min   Short Term Goals: Week 2:  PT Short Term Goal 1 (Week 2): Pt will maintain static stand balance w/ Mod A to maintain L knee extension PT Short Term Goal 1 - Progress (Week 2): Met PT Short Term Goal 2 (Week 2): Pt will perform stand pivot transfer w/ mod A w/ LRAD PT Short Term Goal 2 - Progress (Week 2): Met PT Short Term Goal 3 (Week 2): Pt will initiate w/c mobility PT Short Term Goal 3 - Progress (Week 2): Met PT Short Term Goal 4 (Week 2): PT to initiate AFO consult. PT Short Term Goal 4 - Progress (Week 2): Progressing toward goal  Skilled Therapeutic Interventions/Progress Updates: Patient sitting in [facility] with wife and tech present on entrance to room. Patient alert and agreeable to PT session.   Patient reported no pain. Pt continues to perseverate on function of L LE when he stretches it in the morning (spasticity) and inability to bring heel back (education provided on lack of musculature activation/strength on L LE).  Therapeutic Activity: Bed Mobility: Pt performed sit<>supine on mat with CGA. Transfers: Pt performed sit<>stand transfers throughout session with minA and no AD (CGA to RW in preparation for ambulation). Provided VC for pt to ensure L LE is flat on floor.  Gait Training:  Pt ambulated 100'+ using RW with no AFO donned L LE to assess pt's ability to perform dorsiflexion/plantarflexion in functional aspect. Pt with >half of time L ankle in inversion and plantarflexion (unsure if this is AROM, or occasional foot drop) and other times with ability to have increased clearance through swing. Pt required CGA/minA (especially when unable to safely clear swing).  Neuromuscular Re-ed: - heel raises sitting in WC with L LE (stabilizing front of shoe on floor) and  contract-relax of dorsiflexors with very little noted activation (plantarflexors>dorsiflexors) - Stepping to 6 step with L LE without AFO donned. R UE on hand rail. Pt relies on hip compensation to bring foot off of step back to floor vs motor planning hip flexion only. Pt performing same task with back against wall to 6 step and R U support (back on wall to decrease hip compensation). Pt with decrease knee flexion to retro-step back to floor. PTA assisted with tactile feedback to knee flexors after pt actively performs hip flexion (hip extension>knee flexion) - Pt sitting edge of mat instructed to roll blue board on wheels back under mat for knee flexor activation. Pt required modA (some moments of minA) to bring past 90* knee flexion. - Pt supine on mat performing heel sides with modA. Pt with decreased motor control as pt would activate knee extensors vs flexors when cued (tactile cues).   NMR performed for improvements in motor control and coordination, balance, sequencing, judgement, and self confidence/ efficacy in performing all aspects of mobility at highest level of independence.   Patient sitting in WC at end of session with brakes locked, belt alarm set, and all needs within reach.      Therapy Documentation Precautions:  Precautions Precautions: Fall Precaution/Restrictions Comments: L hemi, neglect Restrictions Weight Bearing Restrictions Per Provider Order: No  Therapy/Group: Individual Therapy  Nikoleta Dady PTA 01/19/2024, 3:10 PM

## 2024-01-19 NOTE — Progress Notes (Signed)
 Speech Language Pathology Daily Session Note  Patient Details  Name: Troy Bishop MRN: [id number] Date of Birth: [birth year]  Today's Date: 01/19/2024 SLP Individual Time: 4098-1191 SLP Individual Time Calculation (min): 58 min  Short Term Goals: Week 3: SLP Short Term Goal 1 (Week 3): STG = LTG due to elos  Skilled Therapeutic Interventions: Skilled therapy session focused on cognitive and dysarthria goals. SLP targeted dysarthria through completion of lingual strengthening exercises. Patient completed x30 repetitions of IOPI set at 45kpa anteriorly and 40kpa posteriorly. This date, patient independently recalled 4/4 speech intelligibility strategies and utilized them with supervision A to reach 85% at the sentence level. SLP targeted cognitive goals through calendar comprehension and flexible problem solving task. Patient required modA this date due to impulsivity. Patient also required mod-maxA to match months to holidays, though this may be due to language barrier. SLP provided education on days in a year, months and seasons. Patient independently sequenced days on the calendar at the end of the session. Patient returned to room and left in [facility] with alarm set and call bell in reach. Continue POC.  Pain None reported  Therapy/Group: Individual Therapy  Lamondre Wesche M.A., CCC-SLP 01/19/2024, 7:40 AM

## 2024-01-19 NOTE — Progress Notes (Addendum)
 Physical Therapy Weekly Progress Note  Patient Details  Name: Troy Bishop MRN: [id number] Date of Birth: [birth year]  Beginning of progress report period: January 11, 2024 End of progress report period: January 19, 2024  Patient has met 3 of 4 short term goals. Pt has made significant progress towards LTG's by excelling STG's set in place. Pt now ambulates/performs sit<>stand transfers with RW (L hemi-grip) with minA overall. Pt no longer in L GRAFO and is now utilizing L PLS AFO as pt can now maintain adequate knee extension during stance phase (L knee still with some flexion stance, but no buckling). Pt ELOS has increased another week in order to increase pts functional independence. Pt LTG's currently set overall to be minA, but will likely need to be upgraded to CGA/supervision transfers and household ambulation.  Patient continues to demonstrate the following deficits impaired timing and sequencing and decreased coordination and therefore will continue to benefit from skilled PT intervention to increase functional independence with mobility.  Patient progressing toward long term goals..  Plan of care revisions: Pt has now exceeded STGS adjusted/downgraded during last weekly and now upgraded to include gait w/ LRAD.  PT Short Term Goals Week 2:  PT Short Term Goal 1 (Week 2): Pt will maintain static stand balance w/ Mod A to maintain L knee extension PT Short Term Goal 1 - Progress (Week 2): Met PT Short Term Goal 2 (Week 2): Pt will perform stand pivot transfer w/ mod A w/ LRAD PT Short Term Goal 2 - Progress (Week 2): Met PT Short Term Goal 3 (Week 2): Pt will initiate w/c mobility PT Short Term Goal 3 - Progress (Week 2): Met PT Short Term Goal 4 (Week 2): PT to initiate AFO consult. PT Short Term Goal 4 - Progress (Week 2): Progressing toward goal  Skilled Therapeutic Interventions/Progress Updates:      Therapy Documentation Precautions:  Precautions Precautions:  Fall Precaution/Restrictions Comments: L hemi, neglect Restrictions Weight Bearing Restrictions Per Provider Order: No [name], PT    Dominic Sandoval PTA  01/19/2024, 1:05 PM

## 2024-01-19 NOTE — Progress Notes (Signed)
 Occupational Therapy Session Note  Patient Details  Name: Troy Bishop MRN: [id number] Date of Birth: 01/21/1991  Today's Date: 01/19/2024 OT Individual Time: 9147-8295 OT Individual Time Calculation (min): 95 min    Short Term Goals: Week 1:  OT Short Term Goal 1 (Week 1): Pt will maintain sitting balance on toilet with min A with max cues OT Short Term Goal 1 - Progress (Week 1): Met OT Short Term Goal 2 (Week 1): Pt will don shirt with mod A with cues OT Short Term Goal 2 - Progress (Week 1): Met OT Short Term Goal 3 (Week 1): Pt will attend to left visual field to obtain items for ADL with min questioning cuing OT Short Term Goal 3 - Progress (Week 1): Met OT Short Term Goal 4 (Week 1): Pt will transfer with mod A +1 consistently OT Short Term Goal 4 - Progress (Week 1): Met Week 2:  OT Short Term Goal 1 (Week 2): Pt will transfer to his L side to toilet with min A. OT Short Term Goal 1 - Progress (Week 2): Met OT Short Term Goal 2 (Week 2): Pt will be able to hold static stand during toileting with min A. OT Short Term Goal 2 - Progress (Week 2): Met OT Short Term Goal 3 (Week 2): Pt will don tshirt with min A. OT Short Term Goal 3 - Progress (Week 2): Met OT Short Term Goal 4 (Week 2): Pt will use LUE as a stabilizing A with min A, OT Short Term Goal 4 - Progress (Week 2): Met Week 3:  OT Short Term Goal 1 (Week 3): Pt will bathe self with min A sitting on tub bench. OT Short Term Goal 2 (Week 3): Pt will don shirt with supervision. OT Short Term Goal 3 (Week 3): Pt will start pants over feet with min A. OT Short Term Goal 4 (Week 3): Pt will pull pants over hips with min A to support balance in standing. OT Short Term Goal 5 (Week 3): Pt will lift L arm actively to be able to reach under arm in bathing.  Skilled Therapeutic Interventions/Progress Updates:    Pt received in w/c with wife as pt was finishing a meal. Pt agreeable to starting his OT session early for an extra 30 min  of therapy.  Pt and wife came to the gym, wife carried his hemiwalker and RW.    Self care training: -standing balance required for his ability to release hand from support to pull up pants in standing and to reach to wash bottom. Used hemiwalker (with L arm sling) pt tends to lean to R and keeps L foot in midline vs in a wider supportive stance. Used mirror and visual targets for cues to achieve midline control and find center to be able to static stand with close Supervision to CGA.  Pt will need to continue to work on this skill to increase independence with self care.    -transfers with RW from mat to a BSC placed in gym to w/c repeating steps numerous times for motor learning reinforcement  -pt now able to grasp walker handle without splint - to work on active attention to L arm, had pt practice actively elevating shoulder and bending elbow to lift hand to place hand on walker and then to remove it. Pt has made excellent progress with these trasnfers with min A.  He can begin to work on ambulation to bathroom with therapy. -placed low BSC over toilet  to give him and armrest to reach for to sit safely and to stand up with  -seated sh flexion with functional arm reach and grasp to push pull his hemiwalker with mod A to guide arm -B hands on ball and rolling ball forward and back with max cues and mod A to keep hands working together, pt having great difficulty with this activity.  Tried hand over hand guiding with grasp and release on laundry basket but pt having great difficulty with dual tasking to attend to B hands. - placed estim for 15 min on finger extensors at intensity 24.  Cued pt to attend to arm.    Pt transferred back to wc and returned to room with all needs met.   Therapy Documentation Precautions:  Precautions Precautions: Fall Precaution/Restrictions Comments: L hemi, neglect Restrictions Weight Bearing Restrictions Per Provider Order: No  Vital Signs: Therapy  Vitals Temp: 98.1 F (36.7 C) Pulse Rate: 71 Resp: 18 BP: 107/70 Patient Position (if appropriate): Lying Oxygen Therapy SpO2: 100 % O2 Device: Room Air Pain:   ADL: ADL Eating: Supervision/safety Grooming: Supervision/safety Where Assessed-Grooming: Sitting at sink Upper Body Bathing: Minimal assistance Where Assessed-Upper Body Bathing: Shower Lower Body Bathing: Moderate assistance Where Assessed-Lower Body Bathing: Shower Upper Body Dressing: Minimal assistance Where Assessed-Upper Body Dressing: Edge of bed Lower Body Dressing: Moderate assistance Where Assessed-Lower Body Dressing: Edge of bed Toileting: Moderate assistance Where Assessed-Toileting: Teacher, adult education: Curator Method: Engineer, water:  (had used stedy to bench, will start to use squat pivots)   Therapy/Group: Individual Therapy  Annalena Piatt 01/19/2024, 9:27 AM

## 2024-01-20 MED ORDER — BACLOFEN 5 MG HALF TABLET
5.0000 mg | ORAL_TABLET | Freq: Two times a day (BID) | ORAL | Status: DC
  Administered 2024-01-20 – 2024-01-30 (×17): 5 mg via ORAL
  Filled 2024-01-20 (×22): qty 1

## 2024-01-20 NOTE — Plan of Care (Signed)
  Problem: Consults Goal: RH STROKE PATIENT EDUCATION Description: See Patient Education module for education specifics  01/20/2024 1808 by Dorie Garfinkel, LPN Outcome: Progressing 01/20/2024 1444 by Dorie Garfinkel, LPN Outcome: Progressing   Problem: RH BOWEL ELIMINATION Goal: RH STG MANAGE BOWEL WITH ASSISTANCE Description: STG Manage Bowel with supervision Assistance. 01/20/2024 1808 by Dorie Garfinkel, LPN Outcome: Progressing 01/20/2024 1444 by Dorie Garfinkel, LPN Outcome: Progressing   Problem: RH BLADDER ELIMINATION Goal: RH STG MANAGE BLADDER WITH ASSISTANCE Description: STG Manage Bladder With  supervision Assistance Outcome: Progressing   Problem: RH SKIN INTEGRITY Goal: RH STG SKIN FREE OF INFECTION/BREAKDOWN Description: Manage skin free of infection/breakdown with supervision assistance Outcome: Progressing   Problem: RH SAFETY Goal: RH STG ADHERE TO SAFETY PRECAUTIONS W/ASSISTANCE/DEVICE Description: STG Adhere to Safety Precautions With supervision  Assistance/Device. Outcome: Progressing   Problem: RH PAIN MANAGEMENT Goal: RH STG PAIN MANAGED AT OR BELOW PT'S PAIN GOAL Description: < 4 w/prns Outcome: Progressing

## 2024-01-20 NOTE — Progress Notes (Signed)
 Physical Therapy Session Note  Patient Details  Name: Troy Bishop MRN: [id number] Date of Birth: April 05, 1991  Today's Date: 01/20/2024 PT Individual Time: 1118-1205 PT Individual Time Calculation (min): 47 min   Short Term Goals: Week 3:  PT Short Term Goal 1 (Week 3): Pt will transfer all surfaces w/ supervision w/ LRAD PT Short Term Goal 2 (Week 3): Pt will amb w/ supervision 11' and LRAD. PT Short Term Goal 3 (Week 3): PT to initiate stair training PT Short Term Goal 4 (Week 3): PT to initiate AFO consult.  Skilled Therapeutic Interventions/Progress Updates: Patient sitting in [facility] with wife present on entrance to room. Patient alert and agreeable to PT session.   Patient reported no pain. Pt with questions of why it was difficult to put AFO on L LE today vs other days. PTA educated on presence of spasticity/tone in L plantarflexors that prevent pt from passive ability to dorsiflex to donn shoe with brace. PTA educated pt and pt wife on stretching/AROM of L ankle in plantarflexion to potentially assist in decreasing.  Therapeutic Activity: Transfers: Pt performed sit<>stand transfers throughout session with CGA in preparation for functional mobility with RW. Provided VC for L UE awareness in placing onto RW handle. Pt required VC to increase awareness to L LE to avoid excessive external rotation.   Gait Training:  Pt ambulated roughly 160' using RW with overall CGA (and roughly 100' from day room<room at end of session). Pt without L hemi-grip donned with cues to increase awareness to L had and to maintain grip as pt would not notice when grip is lost and UE is by pt's side. Pt with cues to increase L hip flexion (presented with decreased ability to coordinate step placement and speed of movement). Added cuing to bring heel through swing to perform heel-strike. Spasticity present throughout in L plantarflexors that increases during stance phase.   Neuromuscular Re-ed: Static standing with cues  to maintain center midline orientation (R lean bias). L knee in flexion with pt cued to maintain in full extension (maxA). AFO doffed due to increased tone in plantarflexors preventing adequate knee extension (education provided to pt as to reasoning). Pt with improved knee extension, and able to maintain in full for 1-2 seconds at a time before slightly giving (tactile cues provided). Pt moments of minA/CGA to maintain standing balance throughout without UE support most of the time. 2 extended rounds performed with  rest break provided.  NMR performed for improvements in motor control and coordination, balance, sequencing, judgement, and self confidence/ efficacy in performing all aspects of mobility at highest level of independence.   Patient sitting in WC at end of session with brakes locked, wife present and all needs within reach.      Therapy Documentation Precautions:  Precautions Precautions: Fall Precaution/Restrictions Comments: L hemi, neglect Restrictions Weight Bearing Restrictions Per Provider Order: No   Therapy/Group: Individual Therapy  Harshal Sirmon PTA 01/20/2024, 12:16 PM

## 2024-01-20 NOTE — Progress Notes (Signed)
 PROGRESS NOTE   Subjective/Complaints:  Pt slept fairly well. No wearing PRAFO at night. Says leg is tight in the morning. Asked about follow up therapy after discharge  ROS: Patient denies fever, rash, sore throat, blurred vision, dizziness, nausea, vomiting, diarrhea, cough, shortness of breath or chest pain, joint or back/neck pain, headache, or mood change.   Objective:   No results found. No results for input(s): WBC, HGB, HCT, PLT in the last 72 hours.    No results for input(s): NA, K, CL, CO2, GLUCOSE, BUN, CREATININE, CALCIUM  in the last 72 hours.         Intake/Output Summary (Last 24 hours) at 01/20/2024 0839 Last data filed at 01/20/2024 5284 Gross per 24 hour  Intake 118 ml  Output 575 ml  Net -457 ml         Physical Exam: Vital Signs Blood pressure 111/77, pulse 85, temperature 98.3 F (36.8 C), temperature source Oral, resp. rate 16, height 5' 7 (1.702 m), weight 64.3 kg, SpO2 100%.    Constitutional: No distress . Vital signs reviewed. HEENT: NCAT, EOMI, oral membranes moist Neck: supple Cardiovascular: RRR without murmur. No JVD    Respiratory/Chest: CTA Bilaterally without wheezes or rales. Normal effort    GI/Abdomen: BS +, non-tender, non-distended Ext: no clubbing, cyanosis, or edema Psych: pleasant and cooperative  Skin: No evidence of breakdown, no evidence of rash Neuro: Alert and awake, dysarthric, L facial droop, L sided weakness.   Neuro: attention can come and go. Sometimes needs to be redirected, Alert and oriented x3  Left facial droop still present. Speech remains somewhat dysarthric LLE 3- left hip add, 4- Left hip flexion, quad  and distally is 0/5, LUE 2- to 2/5  inger flexion, 1/5 wrist/finger extension. Right sided strength is 5/5. DTR's 3+ LLE, 4-5 beats of clonus at ankle. Heel cord is tight. He's hyperreflexic LLE. Good sitting balance Musc: no  back pain or hip pain today. Normal ROM   Assessment/Plan: 1. Functional deficits which require 3+ hours per day of interdisciplinary therapy in a comprehensive inpatient rehab setting. Physiatrist is providing close team supervision and 24 hour management of active medical problems listed below. Physiatrist and rehab team continue to assess barriers to discharge/monitor patient progress toward functional and medical goals  Care Tool:  Bathing    Body parts bathed by patient: Left arm, Chest, Abdomen   Body parts bathed by helper: Right arm     Bathing assist Assist Level: Contact Guard/Touching assist     Upper Body Dressing/Undressing Upper body dressing   What is the patient wearing?: Button up shirt    Upper body assist Assist Level: Minimal Assistance - Patient > 75%    Lower Body Dressing/Undressing Lower body dressing      What is the patient wearing?: Underwear/pull up, Pants     Lower body assist Assist for lower body dressing: Minimal Assistance - Patient > 75%     Toileting Toileting    Toileting assist Assist for toileting: Contact Guard/Touching assist     Transfers Chair/bed transfer  Transfers assist  Chair/bed transfer activity did not occur: Safety/medical concerns  Chair/bed transfer assist level: Contact Guard/Touching  assist     Locomotion Ambulation   Ambulation assist   Ambulation activity did not occur: Safety/medical concerns  Assist level: Minimal Assistance - Patient > 75% Assistive device: Walker-rolling Max distance: 90   Walk 10 feet activity   Assist  Walk 10 feet activity did not occur: Safety/medical concerns  Assist level: Minimal Assistance - Patient > 75% Assistive device: Walker-rolling   Walk 50 feet activity   Assist Walk 50 feet with 2 turns activity did not occur: Safety/medical concerns  Assist level: Minimal Assistance - Patient > 75% Assistive device: Walker-rolling    Walk 150 feet  activity   Assist Walk 150 feet activity did not occur: Safety/medical concerns         Walk 10 feet on uneven surface  activity   Assist Walk 10 feet on uneven surfaces activity did not occur: Safety/medical concerns         Wheelchair     Assist Is the patient using a wheelchair?: Yes Type of Wheelchair: Manual    Wheelchair assist level: Moderate Assistance - Patient 50 - 74% Max wheelchair distance: 150    Wheelchair 50 feet with 2 turns activity    Assist        Assist Level: Moderate Assistance - Patient 50 - 74%   Wheelchair 150 feet activity     Assist      Assist Level: Moderate Assistance - Patient 50 - 74%   Blood pressure 111/77, pulse 85, temperature 98.3 F (36.8 C), temperature source Oral, resp. rate 16, height 5' 7 (1.702 m), weight 64.3 kg, SpO2 100%.  Medical Problem List and Plan: 1. Functional deficits secondary to right basal ganglia large ICH 12/16/23             -patient may shower             -ELOS/Goals: 6/17 minA goals  -Continue CIR therapies including PT, OT, and SLP   -reviewed potential follow up therapy plan with pt  2.  Antithrombotics: -DVT/anticoagulation:  Pharmaceutical: SCDs, heparin  5000U q8h             -antiplatelet therapy: N/A  3. Pain Management/spasticity: Tylenol  prn. Continue gabapentin  200mg  BID -  Oxycodone  2.5mg  q6h PRN   not using    5/28 having intermittent muscular pain it appears, left hip pain better   -continue above as well as heat/ice/rom/oob  6/13 pain controlled     -pt has clonus and is hyperreflexic LLE but ROM appears preserved   -continue to monitor  6/14 ongoing hypertonicity in LLE- will start him on low dose baclofen and observe   -told him he needs to be wearing PRAFO at night   -reviewed basic heel cord stretches with him and wife 4. Mood/Behavior/Sleep: LCSW to follow for evaluation and support.              -antipsychotic agents: N/A  -down to melatonin only at  bedtime. Sleeping well. Very alert in am 5. Neuropsych/cognition: This patient is not capable of making decisions on his own behalf.  6. Skin/Wound Care: Routine pressure relief measures.   7. Fluids/Electrolytes/Nutrition: Monitor I/O. Monitor routine labs. Continue vitamins/supplements.  -Dys 3 diet with thins (SLP to eval)   -Intake improved.  Does better with food from home as well.  -6/13  most recent potassium 3.5--recheck Monday    -he's eating very well 100% 8. Metabolic encephalopathy/ETOH withdrawal: Has resolved, wean seroquel  and ativan  as tolerated. Continue lactulose  30g BID  5/27 ammonia level 23  Will stop lactulose  due to hypokalemia, recheck serum ammonia Monday , monitor for MS changes  9. Abnormal LFTs:  AST/ALT trending down.    -Improved     10. ABLA?: Hgb 12's, monitor Hgb weekly   11. HTN: no meds PTA, started on amlodipine  10mg  daily, coreg  25mg  BID, losartan  100mg  daily. SBP goal <160 6/14 bp remains well controlled   Vitals:   01/16/24 2031 01/17/24 0642 01/17/24 1256 01/17/24 2032  BP: 115/87 108/70 104/69 107/66   01/18/24 0640 01/18/24 1323 01/18/24 2039 01/19/24 0654  BP: 101/62 107/68 117/78 107/70   01/19/24 1538 01/19/24 1856 01/19/24 2052 01/20/24 0524  BP: 100/81 106/82 105/66 111/77      12. Tobacco abuse: continue nicoderm patches.   13. GI ppx: continue Protonix  40mg  daily  14. Bowel management: continue Miralax  daily  -  LBM 5/29 , is now continent  -5/30 stools liquidy---back off miralax  -01/06/24 LBM this morning, not sure what type since not documented; monitor for now -LBM 6/12--   15. HLD: LDL 124, goal LDL <70, per d/c summary, start atorvastatin  40mg  once LFTs downtrend and repeat in 1 week -5/27 LFT's better yesterday -resumed atorvastatin    -LFTs cont to be improved as of 6/9  LOS: 21 days A FACE TO FACE EVALUATION WAS PERFORMED  Rawland Caddy 01/20/2024, 8:39 AM

## 2024-01-20 NOTE — Plan of Care (Signed)
  Problem: Consults Goal: RH STROKE PATIENT EDUCATION Description: See Patient Education module for education specifics  Outcome: Progressing   Problem: RH BOWEL ELIMINATION Goal: RH STG MANAGE BOWEL WITH ASSISTANCE Description: STG Manage Bowel with supervision Assistance. Outcome: Progressing   Problem: RH BLADDER ELIMINATION Goal: RH STG MANAGE BLADDER WITH ASSISTANCE Description: STG Manage Bladder With  supervision Assistance Outcome: Progressing   Problem: RH SKIN INTEGRITY Goal: RH STG SKIN FREE OF INFECTION/BREAKDOWN Description: Manage skin free of infection/breakdown with supervision assistance Outcome: Progressing   Problem: RH SAFETY Goal: RH STG ADHERE TO SAFETY PRECAUTIONS W/ASSISTANCE/DEVICE Description: STG Adhere to Safety Precautions With supervision  Assistance/Device. Outcome: Progressing   Problem: RH PAIN MANAGEMENT Goal: RH STG PAIN MANAGED AT OR BELOW PT'S PAIN GOAL Description: < 4 w/prns Outcome: Progressing

## 2024-01-21 LAB — GLUCOSE, CAPILLARY
Glucose-Capillary: 98 mg/dL (ref 70–99)
Glucose-Capillary: 122 mg/dL — ABNORMAL HIGH (ref 70–99)

## 2024-01-21 NOTE — Progress Notes (Signed)
 PROGRESS NOTE   Subjective/Complaints:  Pt up at EOB. Says he slept well again. No problems with baclofen. Overall happy with his progress  ROS: Patient denies fever, rash, sore throat, blurred vision, dizziness, nausea, vomiting, diarrhea, cough, shortness of breath or chest pain, joint or back/neck pain, headache, or mood change.   Objective:   No results found. No results for input(s): WBC, HGB, HCT, PLT in the last 72 hours.    No results for input(s): NA, K, CL, CO2, GLUCOSE, BUN, CREATININE, CALCIUM  in the last 72 hours.         Intake/Output Summary (Last 24 hours) at 01/21/2024 1006 Last data filed at 01/20/2024 1800 Gross per 24 hour  Intake 476 ml  Output --  Net 476 ml         Physical Exam: Vital Signs Blood pressure 100/65, pulse 74, temperature 98.4 F (36.9 C), resp. rate 18, height 5' 7 (1.702 m), weight 65.9 kg, SpO2 98%.    Constitutional: No distress . Vital signs reviewed. HEENT: NCAT, EOMI, oral membranes moist Neck: supple Cardiovascular: RRR without murmur. No JVD    Respiratory/Chest: CTA Bilaterally without wheezes or rales. Normal effort    GI/Abdomen: BS +, non-tender, non-distended Ext: no clubbing, cyanosis, or edema Psych: pleasant and cooperative  Skin: No evidence of breakdown, no evidence of rash Neuro: Alert and awake, dysarthric, L facial droop.    Neuro: attention can come and go. Sometimes needs to be redirected, Alert and oriented x3    LLE 3- left hip add, 4- Left hip flexion, quad  and distally is 0/5, LUE 2- to 2/5  finger flexion, 1/5 wrist/finger extension. Right sided strength is 5/5. DTR's 3+ LLE, 4-5 beats of clonus at ankle. Heel cord is tight. He's hyperreflexic LLE. Good sitting balance at EOB Musc: no back pain or hip pain today. Normal ROM   Assessment/Plan: 1. Functional deficits which require 3+ hours per day of  interdisciplinary therapy in a comprehensive inpatient rehab setting. Physiatrist is providing close team supervision and 24 hour management of active medical problems listed below. Physiatrist and rehab team continue to assess barriers to discharge/monitor patient progress toward functional and medical goals  Care Tool:  Bathing    Body parts bathed by patient: Left arm, Chest, Abdomen   Body parts bathed by helper: Right arm     Bathing assist Assist Level: Contact Guard/Touching assist     Upper Body Dressing/Undressing Upper body dressing   What is the patient wearing?: Button up shirt    Upper body assist Assist Level: Minimal Assistance - Patient > 75%    Lower Body Dressing/Undressing Lower body dressing      What is the patient wearing?: Underwear/pull up, Pants     Lower body assist Assist for lower body dressing: Minimal Assistance - Patient > 75%     Toileting Toileting    Toileting assist Assist for toileting: Contact Guard/Touching assist     Transfers Chair/bed transfer  Transfers assist  Chair/bed transfer activity did not occur: Safety/medical concerns  Chair/bed transfer assist level: Contact Guard/Touching assist     Locomotion Ambulation   Ambulation assist   Ambulation activity did  not occur: Safety/medical concerns  Assist level: Minimal Assistance - Patient > 75% Assistive device: Walker-rolling Max distance: 90   Walk 10 feet activity   Assist  Walk 10 feet activity did not occur: Safety/medical concerns  Assist level: Minimal Assistance - Patient > 75% Assistive device: Walker-rolling   Walk 50 feet activity   Assist Walk 50 feet with 2 turns activity did not occur: Safety/medical concerns  Assist level: Minimal Assistance - Patient > 75% Assistive device: Walker-rolling    Walk 150 feet activity   Assist Walk 150 feet activity did not occur: Safety/medical concerns         Walk 10 feet on uneven surface   activity   Assist Walk 10 feet on uneven surfaces activity did not occur: Safety/medical concerns         Wheelchair     Assist Is the patient using a wheelchair?: Yes Type of Wheelchair: Manual    Wheelchair assist level: Moderate Assistance - Patient 50 - 74% Max wheelchair distance: 150    Wheelchair 50 feet with 2 turns activity    Assist        Assist Level: Moderate Assistance - Patient 50 - 74%   Wheelchair 150 feet activity     Assist      Assist Level: Moderate Assistance - Patient 50 - 74%   Blood pressure 100/65, pulse 74, temperature 98.4 F (36.9 C), resp. rate 18, height 5' 7 (1.702 m), weight 65.9 kg, SpO2 98%.  Medical Problem List and Plan: 1. Functional deficits secondary to right basal ganglia large ICH 12/16/23             -patient may shower             -ELOS/Goals: 6/17 minA goals  --Continue CIR therapies including PT, OT, and SLP    -have reviewed potential follow up therapy plan with pt  2.  Antithrombotics: -DVT/anticoagulation:  Pharmaceutical: SCDs, heparin  5000U q8h             -antiplatelet therapy: N/A  3. Pain Management/spasticity: Tylenol  prn. Continue gabapentin  200mg  BID -  Oxycodone  2.5mg  q6h PRN   not using    5/28 having intermittent muscular pain it appears, left hip pain better   -continue above as well as heat/ice/rom/oob  6/13 pain controlled     -pt has clonus and is hyperreflexic LLE but ROM appears preserved   -continue to monitor  6/15 ongoing hypertonicity in LLE, heel cord is tight- started him on low dose baclofen yesterday   -told him he needs to be wearing PRAFO at night   -have reviewed basic heel cord stretches with him and wife   -tolerating baclofen so far 4. Mood/Behavior/Sleep: LCSW to follow for evaluation and support.              -antipsychotic agents: N/A  -down to melatonin only at bedtime. Sleeping well. Very alert in am 5. Neuropsych/cognition: This patient is not capable of  making decisions on his own behalf.  6. Skin/Wound Care: Routine pressure relief measures.   7. Fluids/Electrolytes/Nutrition: Monitor I/O. Monitor routine labs. Continue vitamins/supplements.  -Dys 3 diet with thins (SLP to eval)   -Intake improved.  Does better with food from home as well.  -6/13  most recent potassium 3.5--recheck Monday    -he's eating very well 100% 8. Metabolic encephalopathy/ETOH withdrawal: Has resolved, wean seroquel  and ativan  as tolerated. Continue lactulose  30g BID   5/27 ammonia level 23  Will stop lactulose   due to hypokalemia, recheck serum ammonia Monday , monitor for MS changes  9. Abnormal LFTs:  AST/ALT trending down.    -Improved     10. ABLA?: Hgb 12's, monitor Hgb weekly   11. HTN: no meds PTA, started on amlodipine  10mg  daily, coreg  25mg  BID, losartan  100mg  daily. SBP goal <160 6/15 bp remains well controlled   Vitals:   01/18/24 0640 01/18/24 1323 01/18/24 2039 01/19/24 0654  BP: 101/62 107/68 117/78 107/70   01/19/24 1538 01/19/24 1856 01/19/24 2052 01/20/24 0524  BP: 100/81 106/82 105/66 111/77   01/20/24 0855 01/20/24 1323 01/20/24 1926 01/21/24 0508  BP: 112/80 108/71 112/67 100/65      12. Tobacco abuse: continue nicoderm patches.   13. GI ppx: continue Protonix  40mg  daily  14. Bowel management: continue Miralax  daily  -  LBM 5/29 , is now continent  -5/30 stools liquidy---back off miralax  -01/06/24 LBM this morning, not sure what type since not documented; monitor for now -LBM 6/14  15. HLD: LDL 124, goal LDL <70, per d/c summary, start atorvastatin  40mg  once LFTs downtrend and repeat in 1 week -5/27 LFT's better yesterday -resumed atorvastatin    -LFTs cont to be improved as of 6/9  LOS: 22 days A FACE TO FACE EVALUATION WAS PERFORMED  Rawland Caddy 01/21/2024, 10:06 AM

## 2024-01-21 NOTE — Plan of Care (Signed)
  Problem: Consults Goal: RH STROKE PATIENT EDUCATION Description: See Patient Education module for education specifics  Outcome: Progressing   Problem: RH BOWEL ELIMINATION Goal: RH STG MANAGE BOWEL WITH ASSISTANCE Description: STG Manage Bowel with  supervision Assistance. Outcome: Progressing   Problem: RH BLADDER ELIMINATION Goal: RH STG MANAGE BLADDER WITH ASSISTANCE Description: STG Manage Bladder With supervision Assistance Outcome: Progressing   Problem: RH SKIN INTEGRITY Goal: RH STG SKIN FREE OF INFECTION/BREAKDOWN Description: Manage skin free of infection/breakdown with supervision assistance Outcome: Progressing   Problem: RH SAFETY Goal: RH STG ADHERE TO SAFETY PRECAUTIONS W/ASSISTANCE/DEVICE Description: STG Adhere to Safety Precautions With supervision Assistance/Device. Outcome: Progressing

## 2024-01-21 NOTE — Progress Notes (Signed)
 Speech Language Pathology Daily Session Note  Patient Details  Name: Troy Bishop MRN: [id number] Date of Birth: 1990-08-31  Today's Date: 01/21/2024 SLP Individual Time: 6295-2841 SLP Individual Time Calculation (min): 59 min  Short Term Goals: Week 3: SLP Short Term Goal 1 (Week 3): STG = LTG due to elos  Skilled Therapeutic Interventions:   Pt seen for skilled SLP session to address functional cognitive tasks with novel card game. Pt needed to sequence numbers 1-10 with additional rules and strategy to complete game accurately. Pt required moderate cues at start of game, though was completing game at a supervision level by the end of the session. Pt benefited from cues to look all the way to the left and start counting a place 1 to ensure he placed card in correct place holder. Good carryover of rules observed during session. Pt left sitting up in chair with family at bedside and all needs in reach. Continue SLP PoC.   Pain Pain Assessment Pain Scale: 0-10 Pain Score: 0-No pain  Therapy/Group: Individual Therapy  [name] 01/21/2024, 11:31 AM

## 2024-01-22 LAB — CBC WITH DIFFERENTIAL/PLATELET
Abs Immature Granulocytes: 0.03 10*3/uL (ref 0.00–0.07)
Basophils Absolute: 0 10*3/uL (ref 0.0–0.1)
Basophils Relative: 1 %
Eosinophils Absolute: 0.5 10*3/uL (ref 0.0–0.5)
Eosinophils Relative: 9 %
HCT: 34.5 % — ABNORMAL LOW (ref 39.0–52.0)
Hemoglobin: 11.5 g/dL — ABNORMAL LOW (ref 13.0–17.0)
Immature Granulocytes: 1 %
Lymphocytes Relative: 34 %
Lymphs Abs: 2.1 10*3/uL (ref 0.7–4.0)
MCH: 31.3 pg (ref 26.0–34.0)
MCHC: 33.3 g/dL (ref 30.0–36.0)
MCV: 94 fL (ref 80.0–100.0)
Monocytes Absolute: 0.5 10*3/uL (ref 0.1–1.0)
Monocytes Relative: 9 %
Neutro Abs: 2.9 10*3/uL (ref 1.7–7.7)
Neutrophils Relative %: 46 %
Platelets: 100 10*3/uL — ABNORMAL LOW (ref 150–400)
RBC: 3.67 MIL/uL — ABNORMAL LOW (ref 4.22–5.81)
RDW: 12.9 % (ref 11.5–15.5)
WBC: 6.1 10*3/uL (ref 4.0–10.5)
nRBC: 0 % (ref 0.0–0.2)

## 2024-01-22 LAB — GLUCOSE, CAPILLARY: Glucose-Capillary: 87 mg/dL (ref 70–99)

## 2024-01-22 LAB — COMPREHENSIVE METABOLIC PANEL WITH GFR
ALT: 52 U/L — ABNORMAL HIGH (ref 0–44)
AST: 28 U/L (ref 15–41)
Albumin: 3.5 g/dL (ref 3.5–5.0)
Alkaline Phosphatase: 50 U/L (ref 38–126)
Anion gap: 10 (ref 5–15)
BUN: 9 mg/dL (ref 6–20)
CO2: 26 mmol/L (ref 22–32)
Calcium: 9.5 mg/dL (ref 8.9–10.3)
Chloride: 104 mmol/L (ref 98–111)
Creatinine, Ser: 0.67 mg/dL (ref 0.61–1.24)
GFR, Estimated: >60 mL/min (ref >60)
Glucose, Bld: 88 mg/dL (ref 70–99)
Potassium: 3.7 mmol/L (ref 3.5–5.1)
Sodium: 140 mmol/L (ref 135–145)
Total Bilirubin: 0.7 mg/dL (ref 0.0–1.2)
Total Protein: 7.5 g/dL (ref 6.5–8.1)

## 2024-01-22 LAB — AMMONIA: Ammonia: 30 umol/L (ref 9–35)

## 2024-01-22 NOTE — Progress Notes (Signed)
 Occupational Therapy Session Note  Patient Details  Name: Troy Bishop MRN: [id number] Date of Birth: October 14, 1990  Today's Date: 01/22/2024 OT Individual Time: 4132-4401 OT Individual Time Calculation (min): 74 min    Short Term Goals: Week 3:  OT Short Term Goal 1 (Week 3): Pt will bathe self with min A sitting on tub bench. OT Short Term Goal 2 (Week 3): Pt will don shirt with supervision. OT Short Term Goal 3 (Week 3): Pt will start pants over feet with min A. OT Short Term Goal 4 (Week 3): Pt will pull pants over hips with min A to support balance in standing. OT Short Term Goal 5 (Week 3): Pt will lift L arm actively to be able to reach under arm in bathing.  Skilled Therapeutic Interventions/Progress Updates:   Pt greeted seated in recliner, pt agreeable to OT intervention.      Transfers/bed mobility/functional mobility: pt completed functional ambulation to<>from gym with RW and CGA- MINA. Pt needed MOD cues at times to maintain LUE on Rw.    NMR:  -placed horseshoes in pts L hand with pt instructed to grasp horseshoes and hang horseshoes on arm rests on chair to challenge gravity eliminated AROM as well as functional reaching. Pt greatly compensating with trunk rotation needing MAX cues to not use accessory muscles however pt completed task with CGA- MIN A for balance and to keep wrist in neutral position.  -pt completed various active assist ROM exercises with pt holding dowel rod and completing shoulder flexion to 90*, elbow flexion/extension, and internal/external shoulder rotation to challenge bimanual coordination and promote improved L sided attention. Graded task up with pt instructed to keep bean bag balance on dowel rod to maintain equal muscle activation during task. -pt completed weightbearing on LUE with pt instructed to lean anteriorly from EOM onto bench with pt then instructed to use RUE to retrieve cones from floor level and transport cones to mat table on R side to  promote NMRE to LUE. Pt completed task with MAX cues to keep LUE flat on bench.   Applied K tape to L shoulder d/t sublux to promote improved joint stabilization and support during therapy.    Ended session with pt seated in recliner with all needs within reach and wife present.               Therapy Documentation Precautions:  Precautions Precautions: Fall Precaution/Restrictions Comments: L hemi, neglect Restrictions Weight Bearing Restrictions Per Provider Order: No  Pain: No pain   Therapy/Group: Individual Therapy  Mollie Anger [facility] 01/22/2024, 12:18 PM

## 2024-01-22 NOTE — Progress Notes (Signed)
 Speech Language Pathology Weekly Progress and Session Note  Patient Details  Name: Troy Bishop MRN: [id number] Date of Birth: 09-Feb-1991  Beginning of progress report period: January 15, 2024 End of progress report period: January 22, 2024  Today's Date: 01/22/2024 SLP Individual Time: 0300-0330 SLP Individual Time Calculation (min): 30 min  Short Term Goals: Week 3: SLP Short Term Goal 1 (Week 3): STG = LTG due to elos   New Short Term Goals: Week 4: SLP Short Term Goal 1 (Week 4): STG = LTG due to elos  Weekly Progress Updates: Pt has continued to make gains this reporting period due to improved tolerance of regular diet and steady cognitive improvement. Currently pt requires min A for memory, problem solving, and attention. He tolerates a regular/ thin diet. Pt/ family education ongoing. Pt would benefit from continued skilled SLP intervention to maximize cognition in order to maximize his functional independence prior to discharge.  Intensity: Minumum of 1-2 x/day, 30 to 90 minutes Frequency: 3 to 5 out of 7 days Duration/Length of Stay: 6/24 Treatment/Interventions: Therapeutic Activities;Therapeutic Exercise;Internal/external aids;Dysphagia/aspiration precaution training;Patient/family education;Cognitive remediation/compensation;Environmental controls;Cueing hierarchy;Functional tasks  Daily Session  Skilled Therapeutic Interventions:  Session 1: Pt was alert and seated upright in recliner upon SLP arrival. He denied pain and was agreeable for session. Pt indep oriented to temporal and situational concepts. Pt recalled 4/4 speech intelligibility strategies. SLP targeted dysarthria through completion of lingual strengthening exercises. Patient completed 20 repetitions of IOPI set at 50kpa anteriorly and  20 reps at 45kpa posteriorly. SLP engaged pt in a guided conversational exchange with pt intelligibility ranging from 70- 80% at sentence level to unfamiliar conversation partner warranting  min A for use of ovearticulation technique. SLP reintroduced WRAP memory strategies and examples of utilization. Pt challenged in paragraph retention. Given a moderate level paragraph presented verbally, SLP guided pt in repetition and association strategies. Given a 10 minute distracted delay pt recalled information with 100% acc indep. At conclusion of session pt was left upright in recliner with call button within reach and bed alarm active. SLP to continue POC.   Session 2: Patient was seen in PM to address cognitive re- training. Pt alert and upright in WC upon SLP arrival. He was agreeable for session. SLP challenging pt in an executive function task warranting planning, organization, and budgeting skills. Pt tasked with planning a dinner for family and friends while staying within a budget. Pt warranted min A throughout task for awareness, budgeting and organization of information. Pt left upright in WC at conclusion of session with wife present. SLP to continue POC.   General    Pain Pain Assessment Pain Scale: 0-10 Pain Score: 0-No pain  Therapy/Group: Individual Therapy  Adela Holter 01/22/2024, 4:32 PM

## 2024-01-22 NOTE — Plan of Care (Signed)
  Problem: Consults Goal: RH STROKE PATIENT EDUCATION Description: See Patient Education module for education specifics  01/22/2024 1054 by Neal Baldy, LPN Outcome: Progressing 01/22/2024 1053 by Neal Baldy, LPN Outcome: Progressing   Problem: RH BOWEL ELIMINATION Goal: RH STG MANAGE BOWEL WITH ASSISTANCE Description: STG Manage Bowel with supervision Assistance. 01/22/2024 1054 by Neal Baldy, LPN Outcome: Progressing 01/22/2024 1053 by Randeen Busman A, LPN Outcome: Progressing   Problem: RH BLADDER ELIMINATION Goal: RH STG MANAGE BLADDER WITH ASSISTANCE Description: STG Manage Bladder With  supervision Assistance 01/22/2024 1054 by Neal Baldy, LPN Outcome: Progressing 01/22/2024 1053 by Randeen Busman A, LPN Outcome: Progressing   Problem: RH SKIN INTEGRITY Goal: RH STG SKIN FREE OF INFECTION/BREAKDOWN Description: Manage skin free of infection/breakdown with supervision assistance 01/22/2024 1054 by Neal Baldy, LPN Outcome: Progressing 01/22/2024 1053 by Randeen Busman A, LPN Outcome: Progressing   Problem: RH KNOWLEDGE DEFICIT Goal: RH STG INCREASE KNOWLEDGE OF HYPERTENSION Description: Manage increase knowledge of hypertension with supervision assistance from wife using educational materials provided 01/22/2024 1054 by Neal Baldy, LPN Outcome: Progressing 01/22/2024 1053 by Randeen Busman A, LPN Outcome: Progressing Goal: RH STG INCREASE KNOWLEDGE OF DYSPHAGIA/FLUID INTAKE 01/22/2024 1054 by Randeen Busman A, LPN Outcome: Progressing 01/22/2024 1053 by Randeen Busman A, LPN Outcome: Progressing Goal: RH STG INCREASE KNOWLEGDE OF HYPERLIPIDEMIA Description: Manage increase knowledge of hyperlipidemia  with supervision assistance from wife using educational materials provided 01/22/2024 1054 by Neal Baldy, LPN Outcome: Progressing 01/22/2024 1053 by Randeen Busman A, LPN Outcome: Progressing Goal: RH STG INCREASE KNOWLEDGE OF STROKE  PROPHYLAXIS Description: Manage increase knowledge of stroke prophylaxis with supervision assistance from wife using educational materials provided 01/22/2024 1054 by Neal Baldy, LPN Outcome: Progressing 01/22/2024 1053 by Neal Baldy, LPN Outcome: Progressing

## 2024-01-22 NOTE — Progress Notes (Signed)
 PROGRESS NOTE   Subjective/Complaints:  Left foot feels funny , not painful   ROS: Patient with CP, SOB, N/V/D  Objective:   No results found. Recent Labs    01/22/24 0509  WBC 6.1  HGB 11.5*  HCT 34.5*  PLT 100*      Recent Labs    01/22/24 0509  NA 140  K 3.7  CL 104  CO2 26  GLUCOSE 88  BUN 9  CREATININE 0.67  CALCIUM  9.5           Intake/Output Summary (Last 24 hours) at 01/22/2024 0943 Last data filed at 01/22/2024 0554 Gross per 24 hour  Intake 240 ml  Output 1250 ml  Net -1010 ml         Physical Exam: Vital Signs Blood pressure 120/78, pulse 81, temperature 98.6 F (37 C), temperature source Oral, resp. rate 18, height 5' 7 (1.702 m), weight 66.7 kg, SpO2 100%.   General: No acute distress Mood and affect are appropriate Heart: Regular rate and rhythm no rubs murmurs or extra sounds Lungs: Clear to auscultation, breathing unlabored, no rales or wheezes Abdomen: Positive bowel sounds, soft nontender to palpation, nondistended Extremities: No clubbing, cyanosis, or edema Left foot , no skin lesions, no erythema or swelling, no pain with toe , midfoot, or ankle ROM Skin: No evidence of breakdown, no evidence of rash Neuro: Alert and awake, dysarthric, L facial droop.    Neuro: attention can come and go. Sometimes needs to be redirected, Alert and oriented x3    LLE 3- left hip add, 4- Left hip flexion, quad  and distally is 0/5, LUE 2- to 2/5  finger flexion, 1/5 wrist/finger extension. Right sided strength is 5/5. DTR's 3+ LLE, 4-5 beats of clonus at ankle. Heel cord is tight. He's hyperreflexic LLE. Musc: no back pain or hip pain today. Normal ROM   Assessment/Plan: 1. Functional deficits which require 3+ hours per day of interdisciplinary therapy in a comprehensive inpatient rehab setting. Physiatrist is providing close team supervision and 24 hour management of active medical  problems listed below. Physiatrist and rehab team continue to assess barriers to discharge/monitor patient progress toward functional and medical goals  Care Tool:  Bathing    Body parts bathed by patient: Left arm, Chest, Abdomen   Body parts bathed by helper: Right arm     Bathing assist Assist Level: Contact Guard/Touching assist     Upper Body Dressing/Undressing Upper body dressing   What is the patient wearing?: Button up shirt    Upper body assist Assist Level: Minimal Assistance - Patient > 75%    Lower Body Dressing/Undressing Lower body dressing      What is the patient wearing?: Underwear/pull up, Pants     Lower body assist Assist for lower body dressing: Minimal Assistance - Patient > 75%     Toileting Toileting    Toileting assist Assist for toileting: Contact Guard/Touching assist     Transfers Chair/bed transfer  Transfers assist  Chair/bed transfer activity did not occur: Safety/medical concerns  Chair/bed transfer assist level: Contact Guard/Touching assist     Locomotion Ambulation   Ambulation assist   Ambulation activity  did not occur: Safety/medical concerns  Assist level: Minimal Assistance - Patient > 75% Assistive device: Walker-rolling Max distance: 90   Walk 10 feet activity   Assist  Walk 10 feet activity did not occur: Safety/medical concerns  Assist level: Minimal Assistance - Patient > 75% Assistive device: Walker-rolling   Walk 50 feet activity   Assist Walk 50 feet with 2 turns activity did not occur: Safety/medical concerns  Assist level: Minimal Assistance - Patient > 75% Assistive device: Walker-rolling    Walk 150 feet activity   Assist Walk 150 feet activity did not occur: Safety/medical concerns         Walk 10 feet on uneven surface  activity   Assist Walk 10 feet on uneven surfaces activity did not occur: Safety/medical concerns         Wheelchair     Assist Is the patient  using a wheelchair?: Yes Type of Wheelchair: Manual    Wheelchair assist level: Moderate Assistance - Patient 50 - 74% Max wheelchair distance: 150    Wheelchair 50 feet with 2 turns activity    Assist        Assist Level: Moderate Assistance - Patient 50 - 74%   Wheelchair 150 feet activity     Assist      Assist Level: Moderate Assistance - Patient 50 - 74%   Blood pressure 120/78, pulse 81, temperature 98.6 F (37 C), temperature source Oral, resp. rate 18, height 5' 7 (1.702 m), weight 66.7 kg, SpO2 100%.  Medical Problem List and Plan: 1. Functional deficits secondary to right basal ganglia large ICH 12/16/23             -patient may shower             -ELOS/Goals: 6/17 minA goals  --Continue CIR therapies including PT, OT, and SLP    -have reviewed potential follow up therapy plan with pt  2.  Antithrombotics: -DVT/anticoagulation:  Pharmaceutical: SCDs, heparin  5000U q8h             -antiplatelet therapy: N/A  3. Pain Management/spasticity: Tylenol  prn. Continue gabapentin  200mg  BID -  Oxycodone  2.5mg  q6h PRN   not using    5/28 having intermittent muscular pain it appears, left hip pain better   -continue above as well as heat/ice/rom/oob  6/13 pain controlled     -pt has clonus and is hyperreflexic LLE but ROM appears preserved   -continue to monitor  6/15 ongoing hypertonicity in LLE, heel cord is tight- started him on low dose baclofen yesterday   -told him he needs to be wearing PRAFO at night   -have reviewed basic heel cord stretches with him and wife   -tolerating baclofen so far 4. Mood/Behavior/Sleep: LCSW to follow for evaluation and support.              -antipsychotic agents: N/A  -down to melatonin only at bedtime. Sleeping well. Very alert in am 5. Neuropsych/cognition: This patient is not capable of making decisions on his own behalf.  6. Skin/Wound Care: Routine pressure relief measures.   7. Fluids/Electrolytes/Nutrition: Monitor  I/O. Monitor routine labs. Continue vitamins/supplements.  -Dys 3 diet with thins (SLP to eval)   -Intake improved.  Does better with food from home as well.  -6/13  most recent potassium 3.5--recheck Monday    -he's eating very well 100% 8. Metabolic encephalopathy/ETOH withdrawal: Has resolved, wean seroquel  and ativan  as tolerated. Continue lactulose  30g BID   5/27 ammonia level 23  Will stop lactulose  due to hypokalemia, recheck serum ammonia Monday , monitor for MS changes  9. Abnormal LFTs:  AST/ALT trending down.    -Improved     10. ABLA?: Hgb 12's, monitor Hgb weekly   11. HTN: no meds PTA, started on amlodipine  10mg  daily, coreg  25mg  BID, losartan  100mg  daily. SBP goal <160 6/15 bp remains well controlled   Vitals:   01/19/24 0654 01/19/24 1538 01/19/24 1856 01/19/24 2052  BP: 107/70 100/81 106/82 105/66   01/20/24 0524 01/20/24 0855 01/20/24 1323 01/20/24 1926  BP: 111/77 112/80 108/71 112/67   01/21/24 0508 01/21/24 1319 01/21/24 1939 01/22/24 0458  BP: 100/65 114/72 (!) 121/92 120/78      12. Tobacco abuse: continue nicoderm patches.   13. GI ppx: continue Protonix  40mg  daily  14. Bowel management: continue Miralax  daily  -  LBM 5/29 , is now continent  -5/30 stools liquidy---back off miralax  -01/06/24 LBM this morning, not sure what type since not documented; monitor for now -LBM 6/14  15. HLD: LDL 124, goal LDL <70, per d/c summary, start atorvastatin  40mg  once LFTs downtrend and repeat in 1 week -5/27 LFT's better yesterday -resumed atorvastatin    -LFTs cont to be improved as of 6/9  LOS: 23 days A FACE TO FACE EVALUATION WAS PERFORMED  Genetta Kenning 01/22/2024, 9:43 AM

## 2024-01-22 NOTE — Plan of Care (Signed)
  Problem: Consults Goal: RH STROKE PATIENT EDUCATION Description: See Patient Education module for education specifics  Outcome: Progressing   Problem: RH BOWEL ELIMINATION Goal: RH STG MANAGE BOWEL WITH ASSISTANCE Description: STG Manage Bowel with  supervision Assistance. Outcome: Progressing   Problem: RH BLADDER ELIMINATION Goal: RH STG MANAGE BLADDER WITH ASSISTANCE Description: STG Manage Bladder With supervision Assistance Outcome: Progressing   Problem: RH SKIN INTEGRITY Goal: RH STG SKIN FREE OF INFECTION/BREAKDOWN Description: Manage skin free of infection/breakdown with supervision assistance Outcome: Progressing   Problem: RH SAFETY Goal: RH STG ADHERE TO SAFETY PRECAUTIONS W/ASSISTANCE/DEVICE Description: STG Adhere to Safety Precautions With supervision Assistance/Device. Outcome: Progressing

## 2024-01-22 NOTE — Progress Notes (Signed)
 Physical Therapy Session Note  Patient Details  Name: Troy Bishop MRN: [id number] Date of Birth: 13-Apr-1991  Today's Date: 01/22/2024 PT Individual Time: 6213-0865 PT Individual Time Calculation (min): 27 min   Short Term Goals: Week 1:  PT Short Term Goal 1 (Week 1): Pt will transfer sup to sit w/ mod A consistently. PT Short Term Goal 1 - Progress (Week 1): Met PT Short Term Goal 2 (Week 1): Pt will maintain seated balance x 5' w/ CGA PT Short Term Goal 2 - Progress (Week 1): Met PT Short Term Goal 3 (Week 1): Pt will transfer sit to stand w/ mod A + 1 PT Short Term Goal 3 - Progress (Week 1): Met PT Short Term Goal 4 (Week 1): PT to assess gait. PT Short Term Goal 4 - Progress (Week 1): Met  Skilled Therapeutic Interventions/Progress Updates: Patient seated in recliner on entrance to room. Patient alert and agreeable to PT session.   Patient denies being in any pain, and reported that his L hand is not working and wants to use a hand splint for his RW to keep his L hand from falling. Pt walked without RW with min A, but struggled during turns to stand to sit.   Therapeutic Activity: Transfers: Pt performed sit<>stand and stand to sit transfers throughout session with CGA. Provided VC for turning while taking steps backwards, pt keeps feet too close together and loses balance.   Gait Training:  Pt ambulated ~9ft using RW with CGA to the gym, and ~180 without RW with minA. Pt demonstrated the following gait deviations with therapist providing the described cuing and facilitation for improvement: Looking down at ground, lack of foot clearence, R lean.   Patient left seated in recliner at end of session with wife present and all needs within reach.    Therapy Documentation Precautions:  Precautions Precautions: Fall Precaution/Restrictions Comments: L hemi, neglect Restrictions Weight Bearing Restrictions Per Provider Order: No     Therapy/Group: Individual Therapy  Mackie Sayre 01/22/2024, 12:49 PM

## 2024-01-23 NOTE — Progress Notes (Signed)
 Patient continue to refuse Orthosis left, hand splint,boot, and SCD, States he does not need it, wife at bedside and agree with spouse

## 2024-01-23 NOTE — Progress Notes (Signed)
 Speech Language Pathology Daily Session Note  Patient Details  Name: Troy Bishop MRN: [id number] Date of Birth: 01-Jun-1991  Today's Date: 01/23/2024 SLP Individual Time: 0804-0902 SLP Individual Time Calculation (min): 58 min  Short Term Goals: Week 3: SLP Short Term Goal 1 (Week 3): STG = LTG due to elos  Skilled Therapeutic Interventions:  Patient was seen in am to address cognitive re- training. Pt was alert and seated upright on EOB upon SLP arrival. He denied pain and was agreeable for session. Pt indep oriented to temporal, spatial, and situational concepts. Pt recalled speech intelligibility strategies with supervision. SLP reviewed WRAP compensatory strategies and example sof utilization. Pt was subsequently challenged in paragraph retention of moderate level information. Given a paragraph presented verbally, SLP guided pt in repetition and association strategies. Given a 10 minute delay, pt recalled information with 100% acc and min A. In another opportunity he recalled a moderate level paragraph after 15 minutes min A and 100% acc. SLP reintroduced medication management tasks. SLP presented medical scenarios verbally and challenged pt to identify solutions. Pt identified an appropriate solution with given min A. In other minutes of session SLP addressed prescription label interpretation. Pt presented with verbal and auditory presentation of information in consideration of language barrier. Pt warranting mod A with 67% acc during task. Recommendations made to pt and his wife for wife to manage medications with both in agreement. At conclusion of session pt was left upright on EOB at conclusion of session. SLP to continue POC.   Pain Pain Assessment Pain Scale: 0-10 Pain Score: 0-No pain  Therapy/Group: Individual Therapy  Adela Holter 01/23/2024, 12:19 PM

## 2024-01-23 NOTE — Progress Notes (Signed)
 Physical Therapy Session Note  Patient Details  Name: Troy Bishop MRN: [id number] Date of Birth: Feb 26, 1991  Today's Date: 01/23/2024 PT Individual Time: 1345-1430 PT Individual Time Calculation (min): 45 min   Short Term Goals: Week 3:  PT Short Term Goal 1 (Week 3): Pt will transfer all surfaces w/ supervision w/ LRAD PT Short Term Goal 2 (Week 3): Pt will amb w/ supervision 51' and LRAD. PT Short Term Goal 3 (Week 3): PT to initiate stair training PT Short Term Goal 4 (Week 3): PT to initiate AFO consult.  Skilled Therapeutic Interventions/Progress Updates:      Pt sitting in recliner on arrival - agreeable to PT tx and has no reports of pain. Pt wearing his AFO on his L foot during session. Sit<>stand with CGA from recliner - pt holding his phone in his R hand and needing general safety cues to leave in room during session to reduce distraction. Ambulated without AD from his room to day room gym, ~133ft - minA needed for weight shifting, trunk stability. Pt assisted onto Treadmill with minA for safety and harness was donned/doffed in standing. +2 assist utilized on treadmill for added cues and safety. NMR gait training focused on fast paced ambulation, controlled coordination of LLE, improving awareness, and reducing compensation strategies. Gait speed ranged from 0.4-1.2 mph. Attempted to use visual cues (watching soccer) to help promote forward gaze but this was too distracting for patient to dual-task. Then introduced side stepping on treadmill with speed 0.2 mph - pt struggled significantly with this task with motor planning and controlled stepping. Provided visual cueing with targets but this seems to make task more difficulty due to dual-cog limitations. Patient returned to his room and left sitting up in recliner with needs met.  Therapy Documentation Precautions:  Precautions Precautions: Fall Precaution/Restrictions Comments: L hemi, neglect Restrictions Weight Bearing Restrictions  Per Provider Order: No General:     Therapy/Group: Individual Therapy  Pheobe Brass 01/23/2024, 3:46 PM

## 2024-01-23 NOTE — Progress Notes (Signed)
 PROGRESS NOTE   Subjective/Complaints:  Wife asking about drooping Left shoulder Pt also notes left arm and leg pull towards midline No pain on left Discussed muscle imbalance after CVA  ROS: Patient with CP, SOB, N/V/D  Objective:   No results found. Recent Labs    01/22/24 0509  WBC 6.1  HGB 11.5*  HCT 34.5*  PLT 100*      Recent Labs    01/22/24 0509  NA 140  K 3.7  CL 104  CO2 26  GLUCOSE 88  BUN 9  CREATININE 0.67  CALCIUM  9.5           Intake/Output Summary (Last 24 hours) at 01/23/2024 0742 Last data filed at 01/22/2024 2100 Gross per 24 hour  Intake 560 ml  Output 400 ml  Net 160 ml         Physical Exam: Vital Signs Blood pressure 107/60, pulse 80, temperature 98 F (36.7 C), resp. rate 18, height 5' 7 (1.702 m), weight 67.2 kg, SpO2 98%.   General: No acute distress Mood and affect are appropriate Heart: Regular rate and rhythm no rubs murmurs or extra sounds Lungs: Clear to auscultation, breathing unlabored, no rales or wheezes Abdomen: Positive bowel sounds, soft nontender to palpation, nondistended Extremities: No clubbing, cyanosis, or edema Left foot , no skin lesions, no erythema or swelling, no pain with toe , midfoot, or ankle ROM Skin: No evidence of breakdown, no evidence of rash Neuro: Alert and awake, dysarthric, L facial droop.    Neuro: attention can come and go. Sometimes needs to be redirected, Alert and oriented x3    LLE 3- left hip add, 4- Left hip flexion, quad  and distally is 0/5, LUE 2- to 2/5  finger flexion, 1/5 wrist/finger extension. Right sided strength is 5/5. DTR's 3+ LLE, 4-5 beats of clonus at ankle. Heel cord is tight. He's hyperreflexic LLE.  Musc: no back pain or hip pain today. Normal ROM + 1FB sublux inferior Left shoulder no pain with ROM   Assessment/Plan: 1. Functional deficits which require 3+ hours per day of interdisciplinary therapy  in a comprehensive inpatient rehab setting. Physiatrist is providing close team supervision and 24 hour management of active medical problems listed below. Physiatrist and rehab team continue to assess barriers to discharge/monitor patient progress toward functional and medical goals  Care Tool:  Bathing    Body parts bathed by patient: Left arm, Chest, Abdomen   Body parts bathed by helper: Right arm     Bathing assist Assist Level: Contact Guard/Touching assist     Upper Body Dressing/Undressing Upper body dressing   What is the patient wearing?: Button up shirt    Upper body assist Assist Level: Minimal Assistance - Patient > 75%    Lower Body Dressing/Undressing Lower body dressing      What is the patient wearing?: Underwear/pull up, Pants     Lower body assist Assist for lower body dressing: Minimal Assistance - Patient > 75%     Toileting Toileting    Toileting assist Assist for toileting: Contact Guard/Touching assist     Transfers Chair/bed transfer  Transfers assist  Chair/bed transfer activity did not  occur: Safety/medical concerns  Chair/bed transfer assist level: Contact Guard/Touching assist (ambulatory with RW)     Locomotion Ambulation   Ambulation assist   Ambulation activity did not occur: Safety/medical concerns  Assist level: Minimal Assistance - Patient > 75% Assistive device: Walker-rolling Max distance: 90   Walk 10 feet activity   Assist  Walk 10 feet activity did not occur: Safety/medical concerns  Assist level: Minimal Assistance - Patient > 75% Assistive device: Walker-rolling   Walk 50 feet activity   Assist Walk 50 feet with 2 turns activity did not occur: Safety/medical concerns  Assist level: Minimal Assistance - Patient > 75% Assistive device: Walker-rolling    Walk 150 feet activity   Assist Walk 150 feet activity did not occur: Safety/medical concerns         Walk 10 feet on uneven surface   activity   Assist Walk 10 feet on uneven surfaces activity did not occur: Safety/medical concerns         Wheelchair     Assist Is the patient using a wheelchair?: Yes Type of Wheelchair: Manual    Wheelchair assist level: Moderate Assistance - Patient 50 - 74% Max wheelchair distance: 150    Wheelchair 50 feet with 2 turns activity    Assist        Assist Level: Moderate Assistance - Patient 50 - 74%   Wheelchair 150 feet activity     Assist      Assist Level: Moderate Assistance - Patient 50 - 74%   Blood pressure 107/60, pulse 80, temperature 98 F (36.7 C), resp. rate 18, height 5' 7 (1.702 m), weight 67.2 kg, SpO2 98%.  Medical Problem List and Plan: 1. Functional deficits secondary to right basal ganglia large ICH 12/16/23             -patient may shower             -ELOS/Goals: 6/24 minA goals  --Continue CIR therapies including PT, OT, and SLP    -team conf in am   2.  Antithrombotics: -DVT/anticoagulation:  Pharmaceutical: SCDs, heparin  5000U q8h             -antiplatelet therapy: N/A  3. Pain Management/spasticity: Tylenol  prn. Continue gabapentin  200mg  BID -  Oxycodone  2.5mg  q6h PRN   not using    5/28 having intermittent muscular pain it appears, left hip pain better   -continue above as well as heat/ice/rom/oob  6/13 pain controlled     -pt has clonus and is hyperreflexic LLE but ROM appears preserved   -continue to monitor  6/15 ongoing hypertonicity in LLE, heel cord is tight- started him on low dose baclofen yesterday   -told him he needs to be wearing PRAFO at night   -have reviewed basic heel cord stretches with him and wife   -tolerating baclofen so far 4. Mood/Behavior/Sleep: LCSW to follow for evaluation and support.              -antipsychotic agents: N/A  -down to melatonin only at bedtime. Sleeping well. Very alert in am 5. Neuropsych/cognition: This patient is not capable of making decisions on his own behalf.  6.  Skin/Wound Care: Routine pressure relief measures.   7. Fluids/Electrolytes/Nutrition: Monitor I/O. Monitor routine labs. Continue vitamins/supplements.  -Dys 3 diet with thins (SLP to eval)   -Intake improved.  Does better with food from home as well.  -6/13  most recent potassium 3.5--recheck Monday    -he's eating very well 100% 8.  Metabolic encephalopathy/ETOH withdrawal: Has resolved, wean seroquel  and ativan  as tolerated. Continue lactulose  30g BID   5/27 ammonia level 23  Will stop lactulose  due to hypokalemia, recheck serum ammonia Monday , monitor for MS changes  9. Abnormal LFTs:  AST/ALT trending down.    -Improved     10. ABLA?: Hgb 12's, monitor Hgb weekly   11. HTN: no meds PTA, started on amlodipine  10mg  daily, coreg  25mg  BID, losartan  100mg  daily. SBP goal <160 6/15 bp remains well controlled   Vitals:   01/19/24 2052 01/20/24 0524 01/20/24 0855 01/20/24 1323  BP: 105/66 111/77 112/80 108/71   01/20/24 1926 01/21/24 0508 01/21/24 1319 01/21/24 1939  BP: 112/67 100/65 114/72 (!) 121/92   01/22/24 0458 01/22/24 1331 01/22/24 2041 01/23/24 0635  BP: 120/78 104/67 110/70 107/60      12. Tobacco abuse: continue nicoderm patches.   13. GI ppx: continue Protonix  40mg  daily  14. Bowel management: continue Miralax  daily  -  LBM 5/29 , is now continent  -5/30 stools liquidy---back off miralax  -01/06/24 LBM this morning, not sure what type since not documented; monitor for now -LBM 6/14  15. HLD: LDL 124, goal LDL <70, per d/c summary, start atorvastatin  40mg  once LFTs downtrend and repeat in 1 week -5/27 LFT's better yesterday -resumed atorvastatin    -LFTs cont to be improved as of 6/9  LOS: 24 days A FACE TO FACE EVALUATION WAS PERFORMED  Genetta Kenning 01/23/2024, 7:42 AM

## 2024-01-23 NOTE — Plan of Care (Signed)
 Goals upgraded due to progress   Problem: Sit to Stand Goal: LTG:  Patient will perform sit to stand with assistance level (PT) Description: LTG:  Patient will perform sit to stand with assistance level (PT) Flowsheets (Taken 01/23/2024 1602) LTG: PT will perform sit to stand in preparation for functional mobility with assistance level: Supervision/Verbal cueing   Problem: RH Bed to Chair Transfers Goal: LTG Patient will perform bed/chair transfers w/assist (PT) Description: LTG: Patient will perform bed to chair transfers with assistance (PT). Flowsheets (Taken 01/23/2024 1602) LTG: Pt will perform Bed to Chair Transfers with assistance level: Supervision/Verbal cueing   Problem: RH Car Transfers Goal: LTG Patient will perform car transfers with assist (PT) Description: LTG: Patient will perform car transfers with assistance (PT). Flowsheets (Taken 01/23/2024 1602) LTG: Pt will perform car transfers with assist:: Supervision/Verbal cueing   Problem: RH Ambulation Goal: LTG Patient will ambulate in controlled environment (PT) Description: LTG: Patient will ambulate in a controlled environment, # of feet with assistance (PT). Flowsheets (Taken 01/23/2024 1602) LTG: Pt will ambulate in controlled environ  assist needed:: Contact Guard/Touching assist Goal: LTG Patient will ambulate in home environment (PT) Description: LTG: Patient will ambulate in home environment, # of feet with assistance (PT). Flowsheets (Taken 01/23/2024 1602) LTG: Pt will ambulate in home environ  assist needed:: Supervision/Verbal cueing   Problem: RH Stairs Goal: LTG Patient will ambulate up and down stairs w/assist (PT) Description: LTG: Patient will ambulate up and down # of stairs with assistance (PT) Flowsheets (Taken 01/23/2024 1602) LTG: Pt will ambulate up/down stairs assist needed:: Contact Guard/Touching assist LTG: Pt will  ambulate up and down number of stairs: 1 step with LRAD

## 2024-01-23 NOTE — Progress Notes (Signed)
 Occupational Therapy Session Note  Patient Details  Name: Troy Bishop MRN: [id number] Date of Birth: Feb 09, 1991  Today's Date: 01/23/2024 OT Individual Time: 4098-1191 OT Individual Time Calculation (min): 41 min    Short Term Goals: Week 3:  OT Short Term Goal 1 (Week 3): Pt will bathe self with min A sitting on tub bench. OT Short Term Goal 2 (Week 3): Pt will don shirt with supervision. OT Short Term Goal 3 (Week 3): Pt will start pants over feet with min A. OT Short Term Goal 4 (Week 3): Pt will pull pants over hips with min A to support balance in standing. OT Short Term Goal 5 (Week 3): Pt will lift L arm actively to be able to reach under arm in bathing.  Skilled Therapeutic Interventions/Progress Updates:    Pt received sitting up with no c/o pain, agreeable to OT session. He used the RW to complete 100 ft of functional mobility with CGA, requiring cueing for pacing and LLE placement. Session focused on LUE NMR using the Saebo mobile arm support initially to approximate [facility] joint, reduce trunk and upper trap compensation patterns, reduce flexor synergy patterns, and improve AROM. OT provided hands on facilitation to both block compensatory movement patterns and to promote AAROM. He was brought through high repetitions of flexion, extension, horizontal abd/add, and abduction. He was able to achieve ~75% of ROM with min-mod facilitation. He then worked on LUE weightbearing in standing with modified push ups and static loading of the limb in extension for increased bone mineralization and GH joint stability. He demonstrated impulsivity during movements frequently and was very prone to using compensations. Pt returned to their room following. Pt was left sitting up in the recliner with all needs met and call bell within reach.   Therapy Documentation Precautions:  Precautions Precautions: Fall Precaution/Restrictions Comments: L hemi, neglect Restrictions Weight Bearing Restrictions Per  Provider Order: No  Therapy/Group: Individual Therapy  Una Ganser 01/23/2024, 3:20 PM

## 2024-01-23 NOTE — Progress Notes (Signed)
 Physical Therapy Session Note  Patient Details  Name: Troy Bishop MRN: [id number] Date of Birth: 09-21-1990  Today's Date: 01/23/2024 PT Individual Time: 0905-1002 PT Individual Time Calculation (min): 57 min   Short Term Goals: Week 3:  PT Short Term Goal 1 (Week 3): Pt will transfer all surfaces w/ supervision w/ LRAD PT Short Term Goal 2 (Week 3): Pt will amb w/ supervision 62' and LRAD. PT Short Term Goal 3 (Week 3): PT to initiate stair training PT Short Term Goal 4 (Week 3): PT to initiate AFO consult.  Skilled Therapeutic Interventions/Progress Updates: Patient sitting EOB on entrance to room. Patient alert and agreeable to PT session.   Patient with no complaints of pain during session. Pt wife cleared to assist pt with transfers to furniture in room (pt and pt wife participated in education and safety in transfers with RW at end of session in room from recliner<EOB).  Therapeutic Activity: Transfers: Pt performed sit<>stand transfers throughout session with RW in preparation for functional mobility. Required VC to increase retro-step length on L LE to safely sit on seat (cue to back up until back of knees touch sitting surface).   Gait Training:  Pt ambulated from room<>main gym using RW with CGA (150'+ on way). Pt demonstrated the following gait deviations with therapist providing the described cuing and facilitation for improvement:  - Pt with cues to increase heel-strike on L LE (bring heel through swing) to improve step clearance with increased hip flexion - VC to improve L internal rotation - VC to increase awareness to L UE (without saddle grip)  Stair Navigation: Pt navigated 12 (6) steps in main gym with RHR ascending (L descending). Pt cued to ascend with R LE on first set of 4, and to ascend on last 2 sets of 4 in order to improve quadriceps control/contraction (cued to squeeze when R LE follows). Pt with CGA/light minA for safety and cues to increase L internal  rotation per external rotation presentation (also cued to ensure entire foot is on step for safety).   Neuromuscular Re-ed: NMR facilitated during session with focus on dynamic standing balance, coordination. - Pt ambulated in weave pattern between orange cones with RW and CGA and VC to maintain neutral step width and safe proximity to RW. Added cuing to increase L internal rotation.  - Progressed to no RW with minA and cues to improve L internal rotation (external rotation with wide BOS increase without RW support) and to step L LE towards direction of path when weaving around cones. - Stepping to lamenated disc with L LE and yellow theraband pulling into external rotation with pt requiring increased attempts to achieve pure internal rotation (more adduction than internal rotation to get to disc). Pt with minA throughout  NMR performed for improvements in motor control and coordination, balance, sequencing, judgement, and self confidence/ efficacy in performing all aspects of mobility at highest level of independence.   Patient sitting EOB at end of session with brakes locked, wife present alarm set, and all needs within reach.      Therapy Documentation Precautions:  Precautions Precautions: Fall Precaution/Restrictions Comments: L hemi, neglect Restrictions Weight Bearing Restrictions Per Provider Order: No  Therapy/Group: Individual Therapy  Aviona Martenson PTA 01/23/2024, 2:56 PM

## 2024-01-23 NOTE — Plan of Care (Signed)
  Problem: Consults Goal: RH STROKE PATIENT EDUCATION Description: See Patient Education module for education specifics  Outcome: Progressing   Problem: RH BOWEL ELIMINATION Goal: RH STG MANAGE BOWEL WITH ASSISTANCE Description: STG Manage Bowel with  supervision Assistance. Outcome: Progressing   Problem: RH BLADDER ELIMINATION Goal: RH STG MANAGE BLADDER WITH ASSISTANCE Description: STG Manage Bladder With supervision Assistance Outcome: Progressing   Problem: RH SKIN INTEGRITY Goal: RH STG SKIN FREE OF INFECTION/BREAKDOWN Description: Manage skin free of infection/breakdown with supervision assistance Outcome: Progressing   Problem: RH SAFETY Goal: RH STG ADHERE TO SAFETY PRECAUTIONS W/ASSISTANCE/DEVICE Description: STG Adhere to Safety Precautions With supervision Assistance/Device. Outcome: Progressing

## 2024-01-24 NOTE — Plan of Care (Signed)
  Problem: Consults Goal: RH STROKE PATIENT EDUCATION Description: See Patient Education module for education specifics  01/24/2024 1343 by Neal Baldy, LPN Outcome: Progressing 01/24/2024 1106 by Neal Baldy, LPN Outcome: Progressing   Problem: RH BOWEL ELIMINATION Goal: RH STG MANAGE BOWEL WITH ASSISTANCE Description: STG Manage Bowel with supervision Assistance. 01/24/2024 1343 by Neal Baldy, LPN Outcome: Progressing 01/24/2024 1106 by Neal Baldy, LPN Outcome: Progressing   Problem: RH BLADDER ELIMINATION Goal: RH STG MANAGE BLADDER WITH ASSISTANCE Description: STG Manage Bladder With  supervision Assistance 01/24/2024 1343 by Neal Baldy, LPN Outcome: Progressing 01/24/2024 1106 by Randeen Busman A, LPN Outcome: Progressing   Problem: RH SKIN INTEGRITY Goal: RH STG SKIN FREE OF INFECTION/BREAKDOWN Description: Manage skin free of infection/breakdown with supervision assistance 01/24/2024 1343 by Neal Baldy, LPN Outcome: Progressing 01/24/2024 1106 by Neal Baldy, LPN Outcome: Progressing

## 2024-01-24 NOTE — Progress Notes (Signed)
 Occupational Therapy Session Note  Patient Details  Name: Troy Bishop MRN: [id number] Date of Birth: [birth year]  Session 1 Today's Date: 01/24/2024 OT Individual Time: 4098-1191 OT Individual Time Calculation (min): 30 min   Session 2  Today's Date: 01/24/2024 OT Individual Time: 1305-1400 OT Individual Time Calculation (min): 55 min    Short Term Goals: Week 3:  OT Short Term Goal 1 (Week 3): Pt will bathe self with min A sitting on tub bench. OT Short Term Goal 2 (Week 3): Pt will don shirt with supervision. OT Short Term Goal 3 (Week 3): Pt will start pants over feet with min A. OT Short Term Goal 4 (Week 3): Pt will pull pants over hips with min A to support balance in standing. OT Short Term Goal 5 (Week 3): Pt will lift L arm actively to be able to reach under arm in bathing.  Skilled Therapeutic Interventions/Progress Updates:    Session 1 Pt received sitting up in the recliner with no c/o pain, agreeable to OT session. Functional mobility to the therapy gym with the RW, using L orthosis, with CGA- cueing required for upright gaze, LLE positioning. Session focused on LUE NMR. OT facilitated shoulder flexion to reduce abduction compensation while pt completed functional reaching forward onto table top height. He required max facilitation for finger extension, and then was able to grasp cups and stack them, 20 repetitions. He returned to his room, another 100 ft with CGA.    Session 2 Pt received sitting up with no c/o pain, agreeable to OT session focused on shower. He completed functional mobility into the bathroom with CGA using the RW. Mod cueing for safety d/t impulsivity. He required extra time and min A during dynamic sitting balance as he brought each leg into figure 4 positioning to doff shoes and pants. He completed UB bathing seated on the shower chair with OT providing wash mit for the LUE. He was provided demo for RUE facilitating the LUE to reach under arm. He stood and  had frequent LOB when removing BUE from the grab bar. Min A provided. He was able to wash his bottom in standing with min A at the trunk. He transferred to the [facility] following to dress. He had difficulty sequencing threading BLE into his pants, getting the orientation wrong x2. He required min A overall to pull up over the L hip. He donned a shirt with (S). He returned to the recliner and was instructed in one handed sock donning technique which he was able to return with (S). He was left sitting up in the recliner with all needs met, wife present.    Saebo Stim One was placed on his L dorsal wrist to promote muscle activation and finger/wrist extension. No adverse skin reaction. 60 min unattended.  330 pulse width 35 Hz pulse rate On 8 sec/ off 8 sec Ramp up/ down 2 sec Symmetrical Biphasic wave form  Max intensity at 500 Ohm load   Therapy Documentation Precautions:  Precautions Precautions: Fall Precaution/Restrictions Comments: L hemi, neglect Restrictions Weight Bearing Restrictions Per Provider Order: No  Therapy/Group: Individual Therapy  Una Ganser 01/24/2024, 6:22 AM

## 2024-01-24 NOTE — Progress Notes (Signed)
 Physical Therapy Session Note  Patient Details  Name: Troy Bishop MRN: [id number] Date of Birth: [birth year]  Today's Date: 01/24/2024 PT Individual Time: 0805-0903 PT Individual Time Calculation (min): 58 min   Short Term Goals: Week 3:  PT Short Term Goal 1 (Week 3): Pt will transfer all surfaces w/ supervision w/ LRAD PT Short Term Goal 2 (Week 3): Pt will amb w/ supervision 88' and LRAD. PT Short Term Goal 3 (Week 3): PT to initiate stair training PT Short Term Goal 4 (Week 3): PT to initiate AFO consult.  Skilled Therapeutic Interventions/Progress Updates: Patient supine in bed on entrance to room. Patient alert and agreeable to PT session.   Patient with no complaints of pain.   Therapeutic Activity: Bed Mobility: Pt performed supine<sit on EOB with CGA.  Transfers: Pt performed sit<>stand transfers throughout session with CGA in preparation for functional mobility. Provided VC for pt to increase step clearance when retro-stepping L LE, and to ensure pt in safe position to sit.  Pt finishing breakfast at beginning of session and requested to ambulate to toilet. PTA donned personal shoes maxA and L AFO. Pt ambulated in RW to bathroom with CGA and VC to increase awareness to L UE as it consistently fell off RW. Pt maxA to doff/donn personal pants due to time management. Pt voided bladder sitting on toilet. Pt ambulated back to room and sat EOB - PTA obtained L hemi-grip).  Gait Training:  Pt ambulated 150'+ from room<day room<main gym<room (rest breaks for interventions) using RW with CGA. Pt demonstrated the following gait deviations with therapist providing the described cuing and facilitation for improvement:  - Increase internal rotation L LE during swing phase - maintain neutral step width - increase weight shift to R - squeeze L knee extensors during stance phase with min tactile feedback and education provided - Safe management of RW when turning to L to avoid L LE kicking back  L leg of RW   Curb Navigation: Pt navigated 4 curb step (attempted with RW but deferred due to pt's decreased safety at this time to pick up RW and place on step) with R HHA and min/modA to safely maintain standing balance. Pt pushes with R UE into HHA and presented this morning with decreased coordination in advancing LE's across platform. Pt then with L HHA and still same level of assistance (crossed L LE to lateral side of R LE once that required modA to maintain standing). Will need to continue working on increasing pt's safety to get into home.   Patient sitting in recliner at end of session with brakes locked, belt alarm set, and all needs within reach.      Therapy Documentation Precautions:  Precautions Precautions: Fall Precaution/Restrictions Comments: L hemi, neglect Restrictions Weight Bearing Restrictions Per Provider Order: No  Therapy/Group: Individual Therapy  Kaymen Adrian PTA 01/24/2024, 11:48 AM

## 2024-01-24 NOTE — Plan of Care (Signed)
  Problem: Consults Goal: RH STROKE PATIENT EDUCATION Description: See Patient Education module for education specifics  Outcome: Progressing   Problem: RH BOWEL ELIMINATION Goal: RH STG MANAGE BOWEL WITH ASSISTANCE Description: STG Manage Bowel with  supervision Assistance. Outcome: Progressing   Problem: RH BLADDER ELIMINATION Goal: RH STG MANAGE BLADDER WITH ASSISTANCE Description: STG Manage Bladder With supervision Assistance Outcome: Progressing   Problem: RH SKIN INTEGRITY Goal: RH STG SKIN FREE OF INFECTION/BREAKDOWN Description: Manage skin free of infection/breakdown with supervision assistance Outcome: Progressing   Problem: RH SAFETY Goal: RH STG ADHERE TO SAFETY PRECAUTIONS W/ASSISTANCE/DEVICE Description: STG Adhere to Safety Precautions With supervision Assistance/Device. Outcome: Progressing

## 2024-01-24 NOTE — Progress Notes (Signed)
 Speech Language Pathology Daily Session Note  Patient Details  Name: Khale Nigh MRN: [id number] Date of Birth: 1990/10/13  Today's Date: 01/24/2024 SLP Individual Time: 7829-5621 SLP Individual Time Calculation (min): 55 min  Short Term Goals: Week 4: SLP Short Term Goal 1 (Week 4): STG = LTG due to elos  Skilled Therapeutic Interventions: Skilled therapy session focused on dysarthria and cognitive goals. SLP targeted cognitive goals through review of WRAP (write, repeat, associate, picture) memory strategies and recall of daily information. Patient independently recalled activities completed in prior ST sessions including paragraph retention and budgeting activities. SLP targeted dysarthria goals through prompting completion of IOPI for lingual strength. Patient completed x30 repetitions anteriorly at 50kpa and posteriorly at 45kpa. SLP continued to target dysarthria by providing minA for use of SLOP speech intelligibility strategies. Patient reached 90% intelligibility at the phrase level. Patient left in chair with alarm set and call bell in reach. Continue POC.   Pain None reported   Therapy/Group: Individual Therapy  Adel Neyer M.A., CCC-SLP 01/24/2024, 7:17 AM

## 2024-01-24 NOTE — Progress Notes (Signed)
 Orthopedic Tech Progress Note Patient Details:  Troy Bishop 1991-07-10 [id number]  Patient ID: [name], male   DOB: 1991-07-11, 33 y.o.   MRN: [id number] Called into [facility] for AFO consult Edmonia Gottron 01/24/2024, 8:49 AM

## 2024-01-24 NOTE — Progress Notes (Incomplete)
 Patient ID: Troy Bishop, male   DOB: 1990/09/19, 33 y.o.   MRN: [id number]

## 2024-01-24 NOTE — Progress Notes (Addendum)
 PROGRESS NOTE   Subjective/Complaints:  Seen with PT, who feels pt may progress soon to Sup level.  Pt using trial AFO and a consult will be ordered , will also need toe cap Occ elevated tone  ROS: Patient with CP, SOB, N/V/D  Objective:   No results found. Recent Labs    01/22/24 0509  WBC 6.1  HGB 11.5*  HCT 34.5*  PLT 100*      Recent Labs    01/22/24 0509  NA 140  K 3.7  CL 104  CO2 26  GLUCOSE 88  BUN 9  CREATININE 0.67  CALCIUM  9.5           Intake/Output Summary (Last 24 hours) at 01/24/2024 0816 Last data filed at 01/23/2024 2000 Gross per 24 hour  Intake 1200 ml  Output 700 ml  Net 500 ml         Physical Exam: Vital Signs Blood pressure 119/78, pulse 78, temperature 98.2 F (36.8 C), temperature source Oral, resp. rate 17, height 5' 7 (1.702 m), weight 65 kg, SpO2 99%.   General: No acute distress Mood and affect are appropriate Heart: Regular rate and rhythm no rubs murmurs or extra sounds Lungs: Clear to auscultation, breathing unlabored, no rales or wheezes Abdomen: Positive bowel sounds, soft nontender to palpation, nondistended Extremities: No clubbing, cyanosis, or edema Left foot , no skin lesions, no erythema or swelling, no pain with toe , midfoot, or ankle ROM Skin: No evidence of breakdown, no evidence of rash Neuro: Alert and awake, dysarthric, L facial droop.    Neuro: attention can come and go. Sometimes needs to be redirected, Alert and oriented x3    LLE 3- left hip add, 4- Left hip flexion, quad  and distally is 0/5, LUE 2- to 2/5  finger flexion, 1/5 wrist/finger extension. Right sided strength is 5/5. DTR's 3+ LLE, 4-5 beats of clonus at ankle. Heel cord is tight. He's hyperreflexic LLE.  Musc: no back pain or hip pain today. Normal ROM + 1FB sublux inferior Left shoulder no pain with ROM  Gait - Left foot drag even with AFO , mild L knee instability   Assessment/Plan: 1. Functional deficits which require 3+ hours per day of interdisciplinary therapy in a comprehensive inpatient rehab setting. Physiatrist is providing close team supervision and 24 hour management of active medical problems listed below. Physiatrist and rehab team continue to assess barriers to discharge/monitor patient progress toward functional and medical goals  Care Tool:  Bathing    Body parts bathed by patient: Left arm, Chest, Abdomen   Body parts bathed by helper: Right arm     Bathing assist Assist Level: Contact Guard/Touching assist     Upper Body Dressing/Undressing Upper body dressing   What is the patient wearing?: Button up shirt    Upper body assist Assist Level: Minimal Assistance - Patient > 75%    Lower Body Dressing/Undressing Lower body dressing      What is the patient wearing?: Underwear/pull up, Pants     Lower body assist Assist for lower body dressing: Minimal Assistance - Patient > 75%     Editor, commissioning  assist Assist for toileting: Contact Guard/Touching assist     Transfers Chair/bed transfer  Transfers assist  Chair/bed transfer activity did not occur: Safety/medical concerns  Chair/bed transfer assist level: Contact Guard/Touching assist (ambulatory with RW)     Locomotion Ambulation   Ambulation assist   Ambulation activity did not occur: Safety/medical concerns  Assist level: Minimal Assistance - Patient > 75% Assistive device: Walker-rolling Max distance: 90   Walk 10 feet activity   Assist  Walk 10 feet activity did not occur: Safety/medical concerns  Assist level: Minimal Assistance - Patient > 75% Assistive device: Walker-rolling   Walk 50 feet activity   Assist Walk 50 feet with 2 turns activity did not occur: Safety/medical concerns  Assist level: Minimal Assistance - Patient > 75% Assistive device: Walker-rolling    Walk 150 feet activity   Assist Walk 150  feet activity did not occur: Safety/medical concerns         Walk 10 feet on uneven surface  activity   Assist Walk 10 feet on uneven surfaces activity did not occur: Safety/medical concerns         Wheelchair     Assist Is the patient using a wheelchair?: Yes Type of Wheelchair: Manual    Wheelchair assist level: Moderate Assistance - Patient 50 - 74% Max wheelchair distance: 150    Wheelchair 50 feet with 2 turns activity    Assist        Assist Level: Moderate Assistance - Patient 50 - 74%   Wheelchair 150 feet activity     Assist      Assist Level: Moderate Assistance - Patient 50 - 74%   Blood pressure 119/78, pulse 78, temperature 98.2 F (36.8 C), temperature source Oral, resp. rate 17, height 5' 7 (1.702 m), weight 65 kg, SpO2 99%.  Medical Problem List and Plan: 1. Functional deficits secondary to right basal ganglia large ICH 12/16/23             -patient may shower             -ELOS/Goals: 6/24 minA goals  --Continue CIR therapies including PT, OT, and SLP    Team conference today please see physician documentation under team conference tab, met with team  to discuss problems,progress, and goals. Formulized individual treatment plan based on medical history, underlying problem and comorbidities.  F/u PCP Naomia Bachelor PA 2.  Antithrombotics: -DVT/anticoagulation:  Pharmaceutical: SCDs, heparin  5000U q8h             -antiplatelet therapy: N/A  3. Pain Management/spasticity: Tylenol  prn. Continue gabapentin  200mg  BID -  Oxycodone  2.5mg  q6h PRN   not using    5/28 having intermittent muscular pain it appears, left hip pain better   -continue above as well as heat/ice/rom/oob  6/13 pain controlled     -pt has clonus and is hyperreflexic LLE but ROM appears preserved   -continue to monitor  6/15 ongoing hypertonicity in LLE, heel cord is tight- started him on low dose baclofen yesterday   -told him he needs to be wearing PRAFO at  night   -have reviewed basic heel cord stretches with him and wife   -tolerating baclofen so far 4. Mood/Behavior/Sleep: LCSW to follow for evaluation and support.              -antipsychotic agents: N/A  -down to melatonin only at bedtime. Sleeping well. Very alert in am 5. Neuropsych/cognition: This patient is not capable of making decisions on his own behalf.  6. Skin/Wound Care: Routine pressure relief measures.   7. Fluids/Electrolytes/Nutrition: Monitor I/O. Monitor routine labs. Continue vitamins/supplements.  -Dys 3 diet with thins (SLP to eval)   -Intake improved.  Does better with food from home as well.  -6/13  most recent potassium 3.5--recheck Monday    -he's eating very well 100% 8. Metabolic encephalopathy/ETOH withdrawal: Has resolved, wean seroquel  and ativan  as tolerated. Continue lactulose  30g BID   5/27 ammonia level 23  Will stop lactulose  due to hypokalemia, recheck serum ammonia Monday , monitor for MS changes  9. Abnormal LFTs:  AST/ALT trending down.    -Improved     10. ABLA?: Hgb 12's, monitor Hgb weekly   11. HTN: no meds PTA, started on amlodipine  10mg  daily, coreg  25mg  BID, losartan  100mg  daily. SBP goal <160 6/15 bp remains well controlled   Vitals:   01/20/24 1323 01/20/24 1926 01/21/24 0508 01/21/24 1319  BP: 108/71 112/67 100/65 114/72   01/21/24 1939 01/22/24 0458 01/22/24 1331 01/22/24 2041  BP: (!) 121/92 120/78 104/67 110/70   01/23/24 0635 01/23/24 1320 01/23/24 1955 01/24/24 0538  BP: 107/60 114/78 117/74 119/78      12. Tobacco abuse: continue nicoderm patches.   13. GI ppx: continue Protonix  40mg  daily  14. Bowel management: continue Miralax  daily  -  LBM 5/29 , is now continent  -5/30 stools liquidy---back off miralax  -01/06/24 LBM this morning, not sure what type since not documented; monitor for now -LBM 6/17 type 5  15. HLD: LDL 124, goal LDL <70, per d/c summary, start atorvastatin  40mg  once LFTs downtrend and repeat in 1  week -5/27 LFT's better yesterday -resumed atorvastatin    -LFTs cont to be improved as of 6/9  LOS: 25 days A FACE TO FACE EVALUATION WAS PERFORMED  Troy Bishop 01/24/2024, 8:16 AM

## 2024-01-24 NOTE — Patient Care Conference (Signed)
 Inpatient RehabilitationTeam Conference and Plan of Care Update Date: 01/24/2024   Time: 10:23 AM    Patient Name: Troy Bishop      Medical Record Number: [id number]  Date of Birth: 04-18-1991 Sex: Male         Room/Bed: 4W20C/4W20C-01 Payor Info: Payor: [place] MEDICAID PREPAID HEALTH PLAN / Plan: [place] MEDICAID [employer] [place] / Product Type: *No Product type* /    Admit Date/Time:  12/30/2023  5:40 PM  Primary Diagnosis:  ICH (intracerebral hemorrhage) [facility])  Hospital Problems: Principal Problem:   ICH (intracerebral hemorrhage) (HCC) Active Problems:   Acute blood loss anemia   Anxiety state   Essential hypertension    Expected Discharge Date: Expected Discharge Date: 01/30/24  Team Members Present: Physician leading conference: Dr. Janeece Mechanic Social Worker Present: Norval Been, LCSW Nurse Present: Forrestine Ike, RN PT Present: Oma Bias, PT;Dominic Randa Burton, PTA OT Present: Florina Husbands, OT SLP Present: Reggie Caper, SLP PPS Coordinator present : Jestine Moron, SLP     Current Status/Progress Goal Weekly Team Focus  Bowel/Bladder   Continent of bladder/bowel,   Maintain regular patterns   Assess oileting needs, utilize schedule and prn meds as odered    Swallow/Nutrition/ Hydration   reg/ thin   min A  education and use of swallowing strategies    ADL's   ADL transfers now at a CGA level, grasp is functional on RW, developing compensatory movement patterns with shoulder movement but has decent activation overall- abduction to 60 degrees, 50% elbow flex/ext with lack of coordination. min A for ADLs overall.   CGA to min A overall   NMR, postural control, slowing down movement, family edu, ADL retraining, transfers    Mobility   Bed mobility/transfers CGA (RW) - Ambulation 150'+ CGA RW, Stairs, CGA/minA   Upgraded to CGA/Supervision  Focus - L hemi NMRE, Gait, dynamic standing balance without AD, pt/family ed    Communication   ~85%  intelligble at sentence level   min A 90% at phrase   education and recall of strategies    Safety/Cognition/ Behavioral Observations  mod A   min A   awareness, STM, L attention, problem solving    Pain   Denies pain   Painfree   Assess Pain QS/PRN,    Skin   Skin intact   Maintaiin skin integrity  Continnue to assess skin QS/pPRN, no report of injuries      Discharge Planning:  Pt will d/c to home with his wife; wife is primary caregiver of their four children. No other supports. SW will confirm there are no barriers to discharge.   Team Discussion: Patient post ICH with history of ETOH; cirrhosis; with spasticity managed with low dose Baclofen per MD.  Patient on target to meet rehab goals: yes, currently working on comprehensive shoulder movement and coordination for curb negotiation. Needs min assist for ADLs. Able to ambulate over 150' with CGA. 85% intelligible at the sentence level but needs mod assist for problem solving.  Goals for discharge set for CGA overall.    *See Care Plan and progress notes for long and short-term goals.   Revisions to Treatment Plan:  Toe cap for shoe/AFO IPOI   Teaching Needs: Safety, medications, transfers, high level cognitive function tasks/ finance management, etc.   Current Barriers to Discharge: Decreased caregiver support and Home enviroment access/layout  Possible Resolutions to Barriers: Family education OP follow up DME: TTB, RW     Medical Summary Current Status: Frequent stooling  improved, increasing spasticity left upper and left lower limb some mild improvements in overall strength however ambulation distance has improved.    Barriers to Discharge Comments: Needs orthotic management, Likely Toe cap Plus AFO, Possible Resolutions to [employer] Focus: family training prior to discharge, goals of modified independent level to supervision for mobility at discharge   Continued Need for Acute Rehabilitation  Level of Care: The patient requires daily medical management by a physician with specialized training in physical medicine and rehabilitation for the following reasons: Direction of a multidisciplinary physical rehabilitation program to maximize functional independence : Yes Medical management of patient stability for increased activity during participation in an intensive rehabilitation regime.: Yes Analysis of laboratory values and/or radiology reports with any subsequent need for medication adjustment and/or medical intervention. : Yes   I attest that I was present, lead the team conference, and concur with the assessment and plan of the team.   Forrestine Ike B 01/24/2024, 3:51 PM

## 2024-01-25 MED ORDER — SENNOSIDES-DOCUSATE SODIUM 8.6-50 MG PO TABS
2.0000 | ORAL_TABLET | Freq: Two times a day (BID) | ORAL | Status: DC
  Administered 2024-01-26 – 2024-01-30 (×8): 2 via ORAL
  Filled 2024-01-25 (×12): qty 2

## 2024-01-25 NOTE — Progress Notes (Signed)
 Speech Language Pathology Daily Session Note  Patient Details  Name: Bralyn Folkert MRN: [id number] Date of Birth: 01-10-1991  Today's Date: 01/25/2024 SLP Individual Time: 1004-1057 SLP Individual Time Calculation (min): 53 min  Short Term Goals: Week 4: SLP Short Term Goal 1 (Week 4): STG = LTG due to elos  Skilled Therapeutic Interventions: SLP conducted skilled therapy session targeting cognitive goals. Began session with visual-based sequencing, organization, and problem solving task where patient was asked to recreate a pattern using a limited number of multi-colored objects. Patient benefited from min fading to supervision assist to accurately complete. SLP then moved to mildly complex math-based problem solving task with patient benefiting from min to mod assist overall to complete accurately. Patient then completed sustained attention and scanning task with mod assist for thoroughness. In final minutes of session, reviewed WRAP memory strategies with patient requiring provision of written memory aid to recall and min to mod assist to provide examples of use for each strategy. Patient was left in room with call bell in reach and alarm set. SLP will continue to target goals per plan of care.        Pain Pain Assessment Pain Scale: 0-10 Pain Score: 0-No pain  Therapy/Group: Individual Therapy  Geraldo Haris, M.A., CCC-SLP  Nile Dorning A Marciano Mundt 01/25/2024, 10:59 AM

## 2024-01-25 NOTE — Progress Notes (Signed)
 Occupational Therapy Session Note  Patient Details  Name: Troy Bishop MRN: [id number] Date of Birth: [birth year]  Today's Date: 01/25/2024 OT Individual Time: 4098-1191 OT Individual Time Calculation (min): 40 min    Short Term Goals: Week 3:  OT Short Term Goal 1 (Week 3): Pt will bathe self with min A sitting on tub bench. OT Short Term Goal 1 - Progress (Week 3): Met OT Short Term Goal 2 (Week 3): Pt will don shirt with supervision. OT Short Term Goal 2 - Progress (Week 3): Met OT Short Term Goal 3 (Week 3): Pt will start pants over feet with min A. OT Short Term Goal 3 - Progress (Week 3): Met OT Short Term Goal 4 (Week 3): Pt will pull pants over hips with min A to support balance in standing. OT Short Term Goal 4 - Progress (Week 3): Met OT Short Term Goal 5 (Week 3): Pt will lift L arm actively to be able to reach under arm in bathing. OT Short Term Goal 5 - Progress (Week 3): Met  Skilled Therapeutic Interventions/Progress Updates:  Pt greeted seated EOB, pt agreeable to OT intervention.      Transfers/bed mobility/functional mobility:  Pt completed functional ambulation greater than a household distance with RW and supervision. MIN cues needed to recall managing LUE in/out of walker saddle splint.    NMR: pt completed various active assist ROM therex to promote NMRE to LUE from EOM: -Pt completed Active assist ROM with pts LUE on large ball rolling ball forward/backwards and side/side to promote scapular protraction/retraction and shoulder abd/add. Pt needed MIN support to keep hand on ball.  -Pt able to Lift yoga block shoulder height with MIN support to maintain grasp on block to facilitate improved active assist ROM via shoulder flexion.                 Ended session with pt seated in recliner with all needs within reach, wife checked off on transfers.             Therapy Documentation Precautions:  Precautions Precautions: Fall Precaution/Restrictions Comments: L hemi,  neglect Restrictions Weight Bearing Restrictions Per Provider Order: No  Pain: no pain     Therapy/Group: Individual Therapy  Mollie Anger [facility] 01/25/2024, 12:02 PM

## 2024-01-25 NOTE — Progress Notes (Signed)
 PROGRESS NOTE   Subjective/Complaints:  C/o hard stool  ROS: Patient with CP, SOB, N/V/D  Objective:   No results found. No results for input(s): WBC, HGB, HCT, PLT in the last 72 hours.     No results for input(s): NA, K, CL, CO2, GLUCOSE, BUN, CREATININE, CALCIUM  in the last 72 hours.          Intake/Output Summary (Last 24 hours) at 01/25/2024 0708 Last data filed at 01/25/2024 0600 Gross per 24 hour  Intake 178 ml  Output 400 ml  Net -222 ml         Physical Exam: Vital Signs Blood pressure 113/75, pulse 87, temperature 97.9 F (36.6 C), temperature source Oral, resp. rate 18, height 5' 7 (1.702 m), weight 67.1 kg, SpO2 99%.   General: No acute distress Mood and affect are appropriate Heart: Regular rate and rhythm no rubs murmurs or extra sounds Lungs: Clear to auscultation, breathing unlabored, no rales or wheezes Abdomen: Positive bowel sounds, soft nontender to palpation, nondistended Extremities: No clubbing, cyanosis, or edema Left foot , no skin lesions, no erythema or swelling, no pain with toe , midfoot, or ankle ROM Skin: No evidence of breakdown, no evidence of rash Neuro: Alert and awake, dysarthric, L facial droop.    Neuro: attention can come and go. Sometimes needs to be redirected, Alert and oriented x3    LLE 3- left hip add, 4- Left hip flexion, quad  and distally is 0/5, LUE 2- to 2/5  finger flexion, 1/5 wrist/finger extension. Right sided strength is 5/5. DTR's 3+ LLE, Sustained beats of clonus at ankle. Heel cord is tight. He's hyperreflexic LLE.  No clonus at Left wrist or finger flexors  Musc: no back pain or hip pain today. Normal ROM + 1FB sublux inferior Left shoulder no pain with ROM   Assessment/Plan: 1. Functional deficits which require 3+ hours per day of interdisciplinary therapy in a comprehensive inpatient rehab setting. Physiatrist is  providing close team supervision and 24 hour management of active medical problems listed below. Physiatrist and rehab team continue to assess barriers to discharge/monitor patient progress toward functional and medical goals  Care Tool:  Bathing    Body parts bathed by patient: Left arm, Chest, Abdomen, Left upper leg, Right arm, Right lower leg, Left lower leg, Front perineal area, Buttocks, Right upper leg, Face   Body parts bathed by helper: Right arm     Bathing assist Assist Level: Minimal Assistance - Patient > 75%     Upper Body Dressing/Undressing Upper body dressing   What is the patient wearing?: Pull over shirt    Upper body assist Assist Level: Supervision/Verbal cueing    Lower Body Dressing/Undressing Lower body dressing      What is the patient wearing?: Underwear/pull up, Pants     Lower body assist Assist for lower body dressing: Moderate Assistance - Patient 50 - 74%     Toileting Toileting    Toileting assist Assist for toileting: Minimal Assistance - Patient > 75%     Transfers Chair/bed transfer  Transfers assist  Chair/bed transfer activity did not occur: Safety/medical concerns  Chair/bed transfer assist level: Contact Guard/Touching  assist     Locomotion Ambulation   Ambulation assist   Ambulation activity did not occur: Safety/medical concerns  Assist level: Contact Guard/Touching assist Assistive device: Walker-rolling Max distance: 150   Walk 10 feet activity   Assist  Walk 10 feet activity did not occur: Safety/medical concerns  Assist level: Contact Guard/Touching assist Assistive device: Walker-rolling   Walk 50 feet activity   Assist Walk 50 feet with 2 turns activity did not occur: Safety/medical concerns  Assist level: Contact Guard/Touching assist Assistive device: Walker-rolling    Walk 150 feet activity   Assist Walk 150 feet activity did not occur: Safety/medical concerns  Assist level: Contact  Guard/Touching assist Assistive device: Walker-rolling    Walk 10 feet on uneven surface  activity   Assist Walk 10 feet on uneven surfaces activity did not occur: Safety/medical concerns         Wheelchair     Assist Is the patient using a wheelchair?: Yes Type of Wheelchair: Manual    Wheelchair assist level: Moderate Assistance - Patient 50 - 74% Max wheelchair distance: 150    Wheelchair 50 feet with 2 turns activity    Assist        Assist Level: Moderate Assistance - Patient 50 - 74%   Wheelchair 150 feet activity     Assist      Assist Level: Moderate Assistance - Patient 50 - 74%   Blood pressure 113/75, pulse 87, temperature 97.9 F (36.6 C), temperature source Oral, resp. rate 18, height 5' 7 (1.702 m), weight 67.1 kg, SpO2 99%.  Medical Problem List and Plan: 1. Functional deficits secondary to right basal ganglia large ICH 12/16/23             -patient may shower             -ELOS/Goals: 6/24 minA goals  --Continue CIR therapies including PT, OT, and SLP      F/u PCP Naomia Bachelor PA-C 2.  Antithrombotics: -DVT/anticoagulation:  Pharmaceutical: SCDs, heparin  5000U q8h             -antiplatelet therapy: N/A  3. Pain Management/spasticity: Tylenol  prn. Continue gabapentin  200mg  BID -  Oxycodone  2.5mg  q6h PRN   not using    5/28 having intermittent muscular pain it appears, left hip pain better   -continue above as well as heat/ice/rom/oob  6/13 pain controlled     -pt has clonus and is hyperreflexic LLE but ROM appears preserved   -continue to monitor  6/15 ongoing hypertonicity in LLE, heel cord is tight- started him on low dose baclofen yesterday   -told him he needs to be wearing PRAFO at night   -have reviewed basic heel cord stretches with him and wife   -tolerating baclofen so far 4. Mood/Behavior/Sleep: LCSW to follow for evaluation and support.              -antipsychotic agents: N/A  -down to melatonin only at bedtime.  Sleeping well. Very alert in am 5. Neuropsych/cognition: This patient is not capable of making decisions on his own behalf.  6. Skin/Wound Care: Routine pressure relief measures.   7. Fluids/Electrolytes/Nutrition: Monitor I/O. Monitor routine labs. Continue vitamins/supplements.  -Dys 3 diet with thins (SLP to eval)   -Intake improved.  Does better with food from home as well.  -6/13  most recent potassium 3.5--recheck Monday    -he's eating very well 100% 8. Metabolic encephalopathy/ETOH withdrawal: Has resolved, wean seroquel  and ativan  as tolerated. Continue lactulose  30g  BID   5/27 ammonia level 23  Will stop lactulose  due to hypokalemia, recheck serum ammonia Monday , monitor for MS changes  9. Abnormal LFTs:  AST/ALT trending down.    -Improved     10. ABLA?: Hgb 12's, monitor Hgb weekly   11. HTN: no meds PTA, started on amlodipine  10mg  daily, coreg  25mg  BID, losartan  100mg  daily. SBP goal <160 6/15 bp remains well controlled   Vitals:   01/21/24 1319 01/21/24 1939 01/22/24 0458 01/22/24 1331  BP: 114/72 (!) 121/92 120/78 104/67   01/22/24 2041 01/23/24 0635 01/23/24 1320 01/23/24 1955  BP: 110/70 107/60 114/78 117/74   01/24/24 0538 01/24/24 1237 01/24/24 2008 01/25/24 0610  BP: 119/78 114/75 106/79 113/75      12. Tobacco abuse: continue nicoderm patches.   13. GI ppx: continue Protonix  40mg  daily  14. Bowel management: continue Miralax  daily  -  LBM 5/29 , is now continent  -5/30 stools liquidy---back off miralax  -01/06/24 LBM this morning, not sure what type since not documented; monitor for now -LBM 6/17 type 5- requesting laxatives for hard stool  15. HLD: LDL 124, goal LDL <70, per d/c summary, start atorvastatin  40mg  once LFTs downtrend and repeat in 1 week -5/27 LFT's better yesterday -resumed atorvastatin    -LFTs cont to be improved as of 6/9  LOS: 26 days A FACE TO FACE EVALUATION WAS PERFORMED  Genetta Kenning 01/25/2024, 7:08 AM

## 2024-01-25 NOTE — Progress Notes (Signed)
 Occupational Therapy Session Note  Patient Details  Name: Troy Bishop MRN: [id number] Date of Birth: November 11, 1990  Today's Date: 01/25/2024 OT Individual Time: 1351-1430 OT Individual Time Calculation (min): 39 min    Short Term Goals: Week 3:  OT Short Term Goal 1 (Week 3): Pt will bathe self with min A sitting on tub bench. OT Short Term Goal 1 - Progress (Week 3): Met OT Short Term Goal 2 (Week 3): Pt will don shirt with supervision. OT Short Term Goal 2 - Progress (Week 3): Met OT Short Term Goal 3 (Week 3): Pt will start pants over feet with min A. OT Short Term Goal 3 - Progress (Week 3): Met OT Short Term Goal 4 (Week 3): Pt will pull pants over hips with min A to support balance in standing. OT Short Term Goal 4 - Progress (Week 3): Met OT Short Term Goal 5 (Week 3): Pt will lift L arm actively to be able to reach under arm in bathing. OT Short Term Goal 5 - Progress (Week 3): Met  Skilled Therapeutic Interventions/Progress Updates:    Patient received seated in recliner - wife assisting with use of urinal upon OT arrival.  Patient asking to walk to therapy gym.  Walked with RW and hand splint, with new AFO.  Cueing and facilitation to prevent excessive wrist extension by walking past his walker.  Cued patient to keep walker position steady.  Worked on controlled movement in LUE, with emphasis on postural control biasing weight toward left side.  Patient needs overt cueing to visually attend to left hand, and to not overuse internal rotation and adduction when flexion is desired.  Transitioned to modified plantigrade to force weight toward left side.  Needed mod assist initially to truly shift onto left leg and accept weight on left hand.  Patient able to verbally state focus areas to improve attention to left side/ improve quality of left arm movement.  Patient walked back to room, leg dragging every third step as fatigued.  Left up in recliner with wife at bedside.    Therapy  Documentation Precautions:  Precautions Precautions: Fall Precaution/Restrictions Comments: L hemi, neglect Restrictions Weight Bearing Restrictions Per Provider Order: No  Pain: Denies pain   Therapy/Group: Individual Therapy  Gerre Ranum M 01/25/2024, 3:40 PM

## 2024-01-25 NOTE — Progress Notes (Signed)
 Physical Therapy Session Note  Patient Details  Name: Troy Bishop MRN: [id number] Date of Birth: [birth year]  Today's Date: 01/25/2024 PT Individual Time: 5361-4431 PT Individual Time Calculation (min): 57 min   Short Term Goals: Week 3:  PT Short Term Goal 1 (Week 3): Pt will transfer all surfaces w/ supervision w/ LRAD PT Short Term Goal 2 (Week 3): Pt will amb w/ supervision 73' and LRAD. PT Short Term Goal 3 (Week 3): PT to initiate stair training PT Short Term Goal 4 (Week 3): PT to initiate AFO consult.  Skilled Therapeutic Interventions/Progress Updates: Patient sitting in recliner on entrance to room. Patient alert and agreeable to PT session.   Patient reported no pain during session.   Therapeutic Activity: Transfers: Pt performed sit<>stand transfers throughout session with supervision in preparation for functional mobility. VC provided for pt to increase retro-step clearance with L LE, and to ensure back of knee of L LE touches sitting surface while maintaining RW proximity.   Gait Training:  Pt ambulated from room<>main gym using RW with overall supervision and improved L internal rotation at hip as pt presented previously with excessive external rotation. Pt wife educated to supervise pt when ambulating in house and when doing transfers, especially when turning as pt still has decreased coordination with turns and occasionally decreases BOS. Pt also cued to squeeze quadriceps on L during stance phase with pt adhering to cue (even with AFO doffed below). Pt also noted to have decrease anterior hip rotation on L vs R Pt ambulated around main gym with AFO doffed while [employer] rep present to assess. Pt able to keep L ankle close to neutral, and periodically drags front of shoe on floor.   Curb Navigation Pt ascended/descended 4 curb with RW and with CGA throughout to simulate entrance to home. Pt wife participated in family ed. Pt cued to safely bring RW and self close to  step, and to ascend with R UE, and descend with L LE with wife providing good cuing to pt without intervention after several rounds. Pt wife also educated to assist pt on placing RW on step, or off step when exiting house.   Patient sitting in recliner at end of session with brakes locked, wife present, and all needs within reach.      Therapy Documentation Precautions:  Precautions Precautions: Fall Precaution/Restrictions Comments: L hemi, neglect Restrictions Weight Bearing Restrictions Per Provider Order: No  Therapy/Group: Individual Therapy  Beren Yniguez PTA 01/25/2024, 12:29 PM

## 2024-01-25 NOTE — Progress Notes (Signed)
 Occupational Therapy Session Note  Patient Details  Name: Troy Bishop MRN: [id number] Date of Birth: [birth year]  {CHL IP REHAB OT TIME CALCULATIONS:[id number]}   Short Term Goals: Week 4:  OT Short Term Goal 1 (Week 4): STG=LTG  Skilled Therapeutic Interventions/Progress Updates:  Pt greeted *** for skilled OT session with focus on ***.   Pain: Pt reported ***/10 pain, stating *** in reference to ***. OT offering intermediate rest breaks and positioning suggestions throughout session to address pain/fatigue and maximize participation/safety in session.   Functional Transfers:  Self Care Tasks: Pt completes the following self care tasks with levels of assistance noted below, UB: LB:   Therapeutic Activities:  Therapeutic Exercise:   Education:  Pt remained *** with 4Ps assessed and immediate needs met. Pt continues to be appropriate for skilled OT intervention to promote further functional independence in ADLs/IADLs.    Therapy Documentation Precautions:  Precautions Precautions: Fall Precaution/Restrictions Comments: L hemi, neglect Restrictions Weight Bearing Restrictions Per Provider Order: No   Therapy/Group: Individual Therapy  Artemus Biles, OTR/L, MSOT  01/25/2024, 6:12 PM

## 2024-01-25 NOTE — Progress Notes (Signed)
 Occupational Therapy Weekly Progress Note  Patient Details  Name: Troy Bishop MRN: [id number] Date of Birth: 1991/06/14  Beginning of progress report period: January 17, 2024 End of progress report period: January 25, 2024  Today's Date: 01/25/2024     Patient has met 4 of 4 short term goals.  Pt making excellent progress this reporting period with pt able to complete UB ADLS with supervision and LB ADLS with MODA. Pt completes functional ambulation with Rw and L saddle splint with close supervision. Pt continues to present with impaired safety awareness and L inattention. Wife has been present throughout sessions and has been checked off to assist with transfers. DC set for 6/24.   Patient continues to demonstrate the following deficits: impaired timing and sequencing, abnormal tone, unbalanced muscle activation, and decreased motor planning, decreased attention to left, and decreased attention, decreased awareness, decreased problem solving, decreased safety awareness, decreased memory, and delayed processing and therefore will continue to benefit from skilled OT intervention to enhance overall performance with BADL and Reduce care partner burden.  Patient progressing toward long term goals..  Continue plan of care.  OT Short Term Goals Week 1:  OT Short Term Goal 1 (Week 1): Pt will maintain sitting balance on toilet with min A with max cues OT Short Term Goal 1 - Progress (Week 1): Met OT Short Term Goal 2 (Week 1): Pt will don shirt with mod A with cues OT Short Term Goal 2 - Progress (Week 1): Met OT Short Term Goal 3 (Week 1): Pt will attend to left visual field to obtain items for ADL with min questioning cuing OT Short Term Goal 3 - Progress (Week 1): Met OT Short Term Goal 4 (Week 1): Pt will transfer with mod A +1 consistently OT Short Term Goal 4 - Progress (Week 1): Met Week 2:  OT Short Term Goal 1 (Week 2): Pt will transfer to his L side to toilet with min A. OT Short Term Goal 1 -  Progress (Week 2): Met OT Short Term Goal 2 (Week 2): Pt will be able to hold static stand during toileting with min A. OT Short Term Goal 2 - Progress (Week 2): Met OT Short Term Goal 3 (Week 2): Pt will don tshirt with min A. OT Short Term Goal 3 - Progress (Week 2): Met OT Short Term Goal 4 (Week 2): Pt will use LUE as a stabilizing A with min A, OT Short Term Goal 4 - Progress (Week 2): Met Week 3:  OT Short Term Goal 1 (Week 3): Pt will bathe self with min A sitting on tub bench. OT Short Term Goal 1 - Progress (Week 3): Met OT Short Term Goal 2 (Week 3): Pt will don shirt with supervision. OT Short Term Goal 2 - Progress (Week 3): Met OT Short Term Goal 3 (Week 3): Pt will start pants over feet with min A. OT Short Term Goal 3 - Progress (Week 3): Met OT Short Term Goal 4 (Week 3): Pt will pull pants over hips with min A to support balance in standing. OT Short Term Goal 4 - Progress (Week 3): Met OT Short Term Goal 5 (Week 3): Pt will lift L arm actively to be able to reach under arm in bathing. OT Short Term Goal 5 - Progress (Week 3): Met Week 4:  OT Short Term Goal 1 (Week 4): STG=LTG       Therapy Documentation Precautions:  Precautions Precautions: Fall Precaution/Restrictions Comments: L hemi,  neglect Restrictions Weight Bearing Restrictions Per Provider Order: No     Therapy/Group: Individual Therapy  Mollie Anger [facility] 01/25/2024, 8:13 AM

## 2024-01-26 MED ORDER — SORBITOL 70 % SOLN: 30.0000 mL | Freq: Every day | Status: DC | PRN

## 2024-01-26 NOTE — Plan of Care (Signed)
  Problem: Consults Goal: RH STROKE PATIENT EDUCATION Description: See Patient Education module for education specifics  Outcome: Progressing   Problem: RH BOWEL ELIMINATION Goal: RH STG MANAGE BOWEL WITH ASSISTANCE Description: STG Manage Bowel with supervision Assistance. Outcome: Progressing   Problem: RH BLADDER ELIMINATION Goal: RH STG MANAGE BLADDER WITH ASSISTANCE Description: STG Manage Bladder With  supervision Assistance Outcome: Progressing   Problem: RH SKIN INTEGRITY Goal: RH STG SKIN FREE OF INFECTION/BREAKDOWN Description: Manage skin free of infection/breakdown with supervision assistance Outcome: Progressing   Problem: RH SAFETY Goal: RH STG ADHERE TO SAFETY PRECAUTIONS W/ASSISTANCE/DEVICE Description: STG Adhere to Safety Precautions With supervision  Assistance/Device. Outcome: Progressing   Problem: RH PAIN MANAGEMENT Goal: RH STG PAIN MANAGED AT OR BELOW PT'S PAIN GOAL Description: < 4 w/prns Outcome: Progressing   Problem: RH KNOWLEDGE DEFICIT Goal: RH STG INCREASE KNOWLEDGE OF HYPERTENSION Description: Manage increase knowledge of hypertension with supervision assistance from wife using educational materials provided Outcome: Progressing Goal: RH STG INCREASE KNOWLEDGE OF DYSPHAGIA/FLUID INTAKE Outcome: Progressing Goal: RH STG INCREASE KNOWLEGDE OF HYPERLIPIDEMIA Description: Manage increase knowledge of hyperlipidemia  with supervision assistance from wife using educational materials provided Outcome: Progressing Goal: RH STG INCREASE KNOWLEDGE OF STROKE PROPHYLAXIS Description: Manage increase knowledge of stroke prophylaxis with supervision assistance from wife using educational materials provided Outcome: Progressing   Problem: RH Vision Goal: RH LTG Vision (Specify) Outcome: Progressing

## 2024-01-26 NOTE — Progress Notes (Signed)
 Physical Therapy Weekly Progress Note  Patient Details  Name: Troy Bishop MRN: [id number] Date of Birth: June 20, 1991  Beginning of progress report period: January 20, 2024 End of progress report period: January 26, 2024  Patient has met 4 of 4 short term goals. Pt is making progress towards LTG's by demonstrating significant improvement in functional mobility. Pt currently ambulates 150'+ with RW and supervision. PT has been provided with L PLS AFO and shoe cap to further assist in pt's ability to safely ambulate after d/c with RW. Pt navigates stairs with CGA/minA, but has performed in order to further increase L quadriceps activation with L LE ascending. Curb navigation also performed as pt has one step with no railing to enter home (does so with CGA with RW and with wife assisting).   Patient continues to demonstrate the following deficits muscle weakness and muscle joint tightness, decreased cardiorespiratoy endurance, impaired timing and sequencing, abnormal tone, unbalanced muscle activation, motor apraxia, decreased coordination, and decreased motor planning, decreased attention to right, decreased initiation, decreased attention, decreased awareness, decreased problem solving, decreased safety awareness, and decreased memory, and decreased standing balance, decreased postural control, hemiplegia, and decreased balance strategies and therefore will continue to benefit from skilled PT intervention to increase functional independence with mobility.  Patient progressing toward long term goals..  Continue plan of care.  PT Short Term Goals Week 3:  PT Short Term Goal 1 (Week 3): Pt will transfer all surfaces w/ supervision w/ LRAD PT Short Term Goal 1 - Progress (Week 3): Met PT Short Term Goal 2 (Week 3): Pt will amb w/ supervision 54' and LRAD. PT Short Term Goal 2 - Progress (Week 3): Met PT Short Term Goal 3 (Week 3): PT to initiate stair training PT Short Term Goal 3 - Progress (Week 3): Met PT  Short Term Goal 4 (Week 3): PT to initiate AFO consult. PT Short Term Goal 4 - Progress (Week 3): Met  Therapy Documentation Precautions:  Precautions Precautions: Fall Precaution/Restrictions Comments: L hemi, neglect Restrictions Weight Bearing Restrictions Per Provider Order: No  Dominic Sandoval PTA   01/26/2024, 9:21 AM

## 2024-01-26 NOTE — Plan of Care (Signed)
  Problem: Consults Goal: RH STROKE PATIENT EDUCATION Description: See Patient Education module for education specifics  01/26/2024 1753 by Dorie Garfinkel, LPN Outcome: Progressing 01/26/2024 1753 by Dorie Garfinkel, LPN Outcome: Progressing   Problem: RH BOWEL ELIMINATION Goal: RH STG MANAGE BOWEL WITH ASSISTANCE Description: STG Manage Bowel with supervision Assistance. 01/26/2024 1753 by Dorie Garfinkel, LPN Outcome: Adequate for Discharge 01/26/2024 1753 by Dorie Garfinkel, LPN Outcome: Adequate for Discharge   Problem: RH BLADDER ELIMINATION Goal: RH STG MANAGE BLADDER WITH ASSISTANCE Description: STG Manage Bladder With  supervision Assistance 01/26/2024 1753 by Dorie Garfinkel, LPN Outcome: Adequate for Discharge 01/26/2024 1753 by Dorie Garfinkel, LPN Outcome: Adequate for Discharge

## 2024-01-26 NOTE — Progress Notes (Signed)
 Physical Therapy Session Note  Patient Details  Name: Troy Bishop MRN: [id number] Date of Birth: 1991/06/17  Today's Date: 01/26/2024 PT Individual Time: 1402-1430 PT Individual Time Calculation (min): 28 min   Short Term Goals: Week 4:  PT Short Term Goal 1 (Week 4): STG = LTG due to ELOS  Skilled Therapeutic Interventions/Progress Updates:    Pt presents seated EOM handoff from PTA. Pt denies pain and agreeable to PT. Session focused on neuro reed for dynamic standing balance and BLE muscle fiber recruitment. Pt completes transfers with CGA without device, supervision with RW. Pt ambualtes without device 100' with CGA/min assist with pt demonstrating decreased LLE stance time and ER for swing phase, decreased step length RLE. Pt ambulates with RW to day room with supervision/CGA with cues for upright posture and forward gaze. Pt comes to sitting on kinetron, completes seated BLE NMR on kinetron x2 minutes with cues for L knee positioning as pt demonstrating excessive ER and abduction. Pt completes sit<>stands on kinetron x10 with mirror for visual cue with pt demonstrating excessive weightshift to RLE, able to correct with max verbal/visual/tactile cues on L quad. Pt then completes standing NMR on kinetron ~2 minutes with max verbal/tactile/visual cues for L knee/hip terminal extension. Pt ambulates back to room with RW with supervision/CGA, remains seated in recliner with all needs within reach, call light in place, and pt care partner seated at bedside at end of sessoin.  Therapy Documentation Precautions:  Precautions Precautions: Fall Precaution/Restrictions Comments: L hemi, neglect Restrictions Weight Bearing Restrictions Per Provider Order: No    Therapy/Group: Individual Therapy  Annia Kilts PT, DPT 01/26/2024, 5:05 PM

## 2024-01-26 NOTE — Plan of Care (Signed)
  Problem: Consults Goal: RH STROKE PATIENT EDUCATION Description: See Patient Education module for education specifics  Outcome: Progressing   Problem: RH BOWEL ELIMINATION Goal: RH STG MANAGE BOWEL WITH ASSISTANCE Description: STG Manage Bowel with supervision Assistance. Outcome: Adequate for Discharge   Problem: RH BLADDER ELIMINATION Goal: RH STG MANAGE BLADDER WITH ASSISTANCE Description: STG Manage Bladder With  supervision Assistance Outcome: Adequate for Discharge

## 2024-01-26 NOTE — Progress Notes (Signed)
 Physical Therapy Session Note  Patient Details  Name: Troy Bishop MRN: [id number] Date of Birth: 1991-06-14  Today's Date: 01/26/2024 PT Individual Time: 4098-1191; 1127 - 1204; 1305 - 1402 PT Individual Time Calculation (min): 50 min; 38 min; 57 min    Short Term Goals: Week 3:  PT Short Term Goal 1 (Week 3): Pt will transfer all surfaces w/ supervision w/ LRAD PT Short Term Goal 1 - Progress (Week 3): Met PT Short Term Goal 2 (Week 3): Pt will amb w/ supervision 44' and LRAD. PT Short Term Goal 2 - Progress (Week 3): Met PT Short Term Goal 3 (Week 3): PT to initiate stair training PT Short Term Goal 3 - Progress (Week 3): Met PT Short Term Goal 4 (Week 3): PT to initiate AFO consult. PT Short Term Goal 4 - Progress (Week 3): Met  SESSION 1 Skilled Therapeutic Interventions/Progress Updates: Patient sitting EOB with wife present on entrance to room. Patient alert and agreeable to PT session.   Patient reported unrated pain in L UE on shoulder (manual therapy performed with education provided to pt and pt wife to assist at home).   Therapeutic Activity: Transfers: Pt performed sit<>stand transfers throughout session with RW and with supervision. Provided VC for maintaining safe proximity to RW.  Gait Training:  Pt ambulated 300'+ on non-compliant surfaces using RW with overall supervision. Pt cued throughout to avoid hitting back L of RW with LE as pt compensates L LE advancement by externally rotating/lightly circumducting L hip to clear vs using hip flexors (pt educated and cued to avoid hitting PTA foot to promote increase in L hip flexor activation). Pt cued to increase step length on R to also increase stance time on L (while also performing heel-strike). Pt noted to continue to have improvement in internal rotation to maintain neutral step alignment vs previous excessive external rotation. Pt had one moment of LOB to the right when statically standing but distracted by directions ahead  (maxA to prevent). Pt and pt wife educated to increase awareness to distractions at home potentially causing increased risk of falls.  Neuromuscular Re-ed: NMR facilitated during session with focus on neuromuscular control of L LE. - Pt performing hip flexion on L LE sitting in WC with 5lb ankle weight donned. Pt cued to solely use hip flexors vs going into external rotation at hip. Pt cued to avoid touching PTA hand to promote hip flexion. Pt performed 2 rounds close to fatigue.  NMR performed for improvements in motor control and coordination, balance, sequencing, judgement, and self confidence/ efficacy in performing all aspects of mobility at highest level of independence.   Patient sitting in WC at end of session with brakes locked, wife present and all needs within reach.  SESSION 2 Skilled Therapeutic Interventions/Progress Updates: Patient sitting in recliner with wife present on entrance to room. Patient alert and agreeable to PT session.   Patient with no complaints of pain during session.  Therapeutic Activity: Transfers: Pt performed sit<>stand transfers throughout session with supervision in preparation for functional mobility. Provided VC pt to squeeze L hand on saddle grip prior to donning strap.  - Pt participated in car transfer with wife present and pt doing so with supervision and RW (heigh elevated to family's tallest car). Pt utilizes UE to advance L LE safely into car with no issue. - Pt participated in floor transfer and required minA to safely rise with sequence of UE/LE placement and using stable surface with R UE. Pt to  continue working on floor transfer during afternoon session. Wife and pt further educated that pt is d/c at supervision level and will require someone there at home for safety, and that pt happens to experience a fall, pt and family should assess if injury occurred prior to moving, and if so to call 911.   Gait Training:  Pt ambulated from room<>ortho  gym and main gym during session using RW with supervision. Pt provided with VC to increase quad activation during stance, and to increase step length on R LE throughout.  Patient sitting EOB at end of session with brakes locked, wife present, and all needs within reach.  SESSION 3 Skilled Therapeutic Interventions/Progress Updates: Patient sitting in recliner on entrance to room. Patient alert and agreeable to PT session.   Patient reported unrated pain in L heel from AFO. PTA provided bandage on heel cord to decrease pain with pt reporting so after short ambulation in day room  Therapeutic Activity: Transfers: Pt performed sit<>stand transfers throughout session with supervision in preparation for functional ambulation. Provided VC for pt to ensure B feet flat on ground.  - Floor transfer with pt cued to supine scoot to L and R to get closer to stable surfaces to assist with standing. Pt performed with minA to adjust L LE in kneeling position to get to standing. Pt performed supine<sit, then to kneeling (did so sidelying earlier to assess how pt felt with different methods).   Gait Training:  Pt ambulated throughout session and main gym using RW with supervision and VC to increase R step length and weight shift to R (PTA also facilitating anterior hip rotation during swing). Pt continues to show improved L neutral alignment during swing and terminal swing up to heel strike.   -Pt ascended/descended 12 (6) steps with RHR ascending and LHR descending. Pt with CGA throughout and VC to ascend with R LE, and descend with L. Pt also cued to increase L internal rotation at hip to neutrally align step placement on step (improved after a few steps with min cuing).   Neuromuscular Re-ed: - Pt ambulated roughly 100' in RW with 5lb ankle weight donned L LE with cues to bring L knee up to RW to increase hip flexor activation. Pt also cued to increase R step length to decrease R hip hike compensation when pt  cued to squeeze L quads during stance.  NMR performed for improvements in motor control and coordination, balance, sequencing, judgement, and self confidence/ efficacy in performing all aspects of mobility at highest level of independence.   Patient hand off to next PT session in main gym sitting edge of mat.      Therapy Documentation Precautions:  Precautions Precautions: Fall Precaution/Restrictions Comments: L hemi, neglect Restrictions Weight Bearing Restrictions Per Provider Order: No  Therapy/Group: Individual Therapy  Fredrick Geoghegan PTA 01/26/2024, 12:37 PM

## 2024-01-26 NOTE — Progress Notes (Signed)
 Patient ID: Troy Bishop, male   DOB: 03/26/1991, 33 y.o.   MRN: [id number]  SW left message for pt wife informing on d/c recs- outpatient PT/OT/SLP and DME- TTB and RW.   *SW went by room and met with pt and wife and discussed above. SW will order DME. Will send outpatient referral to [facility] ([phone]/[phone]).   DME ordered with [employer] via parachute. Outpatient referral faxed as well.   Norval Been, MSW, LCSW Office: [phone] Cell: [phone] Fax: [phone]

## 2024-01-26 NOTE — Progress Notes (Signed)
 PROGRESS NOTE   Subjective/Complaints:  Has avoided laxatives due to fears of BM conflicting with PT, no abd pain , appetite is good  ROS: Patient with CP, SOB, N/V/D  Objective:   No results found. No results for input(s): WBC, HGB, HCT, PLT in the last 72 hours.     No results for input(s): NA, K, CL, CO2, GLUCOSE, BUN, CREATININE, CALCIUM  in the last 72 hours.          Intake/Output Summary (Last 24 hours) at 01/26/2024 0854 Last data filed at 01/25/2024 1820 Gross per 24 hour  Intake 473 ml  Output 575 ml  Net -102 ml         Physical Exam: Vital Signs Blood pressure 105/64, pulse 79, temperature 98.2 F (36.8 C), resp. rate 18, height 5' 7 (1.702 m), weight 68.1 kg, SpO2 100%.   General: No acute distress Mood and affect are appropriate Heart: Regular rate and rhythm no rubs murmurs or extra sounds Lungs: Clear to auscultation, breathing unlabored, no rales or wheezes Abdomen: Positive bowel sounds, soft nontender to palpation, nondistended Extremities: No clubbing, cyanosis, or edema Left foot , no skin lesions, no erythema or swelling, no pain with toe , midfoot, or ankle ROM Skin: No evidence of breakdown, no evidence of rash Neuro: Alert and awake, dysarthric, L facial droop.    Neuro: attention can come and go. Sometimes needs to be redirected, Alert and oriented x3    LLE 3- left hip add, 4- Left hip flexion, quad  and distally is 0/5, LUE 2- to 2/5  finger flexion, 1/5 wrist/finger extension. Right sided strength is 5/5. DTR's 3+ LLE, Sustained beats of clonus at ankle. Heel cord is tight. He's hyperreflexic LLE.  No clonus at Left wrist or finger flexors  Musc: no back pain or hip pain today. Normal ROM + 1FB sublux inferior Left shoulder no pain with ROM   Assessment/Plan: 1. Functional deficits which require 3+ hours per day of interdisciplinary therapy in a  comprehensive inpatient rehab setting. Physiatrist is providing close team supervision and 24 hour management of active medical problems listed below. Physiatrist and rehab team continue to assess barriers to discharge/monitor patient progress toward functional and medical goals  Care Tool:  Bathing    Body parts bathed by patient: Left arm, Chest, Abdomen, Left upper leg, Right arm, Right lower leg, Left lower leg, Front perineal area, Buttocks, Right upper leg, Face   Body parts bathed by helper: Right arm     Bathing assist Assist Level: Minimal Assistance - Patient > 75%     Upper Body Dressing/Undressing Upper body dressing   What is the patient wearing?: Pull over shirt    Upper body assist Assist Level: Supervision/Verbal cueing    Lower Body Dressing/Undressing Lower body dressing      What is the patient wearing?: Underwear/pull up, Pants     Lower body assist Assist for lower body dressing: Moderate Assistance - Patient 50 - 74%     Toileting Toileting    Toileting assist Assist for toileting: Minimal Assistance - Patient > 75%     Transfers Chair/bed transfer  Transfers assist  Chair/bed transfer activity  did not occur: Safety/medical concerns  Chair/bed transfer assist level: Contact Guard/Touching assist     Locomotion Ambulation   Ambulation assist   Ambulation activity did not occur: Safety/medical concerns  Assist level: Contact Guard/Touching assist Assistive device: Walker-rolling Max distance: 150   Walk 10 feet activity   Assist  Walk 10 feet activity did not occur: Safety/medical concerns  Assist level: Contact Guard/Touching assist Assistive device: Walker-rolling   Walk 50 feet activity   Assist Walk 50 feet with 2 turns activity did not occur: Safety/medical concerns  Assist level: Contact Guard/Touching assist Assistive device: Walker-rolling    Walk 150 feet activity   Assist Walk 150 feet activity did not  occur: Safety/medical concerns  Assist level: Contact Guard/Touching assist Assistive device: Walker-rolling    Walk 10 feet on uneven surface  activity   Assist Walk 10 feet on uneven surfaces activity did not occur: Safety/medical concerns         Wheelchair     Assist Is the patient using a wheelchair?: Yes Type of Wheelchair: Manual    Wheelchair assist level: Moderate Assistance - Patient 50 - 74% Max wheelchair distance: 150    Wheelchair 50 feet with 2 turns activity    Assist        Assist Level: Moderate Assistance - Patient 50 - 74%   Wheelchair 150 feet activity     Assist      Assist Level: Moderate Assistance - Patient 50 - 74%   Blood pressure 105/64, pulse 79, temperature 98.2 F (36.8 C), resp. rate 18, height 5' 7 (1.702 m), weight 68.1 kg, SpO2 100%.  Medical Problem List and Plan: 1. Functional deficits secondary to right basal ganglia large ICH 12/16/23             -patient may shower             -ELOS/Goals: 6/24 minA goals  --Continue CIR therapies including PT, OT, and SLP      F/u PCP Naomia Bachelor PA-C 2.  Antithrombotics: -DVT/anticoagulation:  Pharmaceutical: SCDs, heparin  5000U q8h             -antiplatelet therapy: N/A  3. Pain Management/spasticity: Tylenol  prn. Continue gabapentin  200mg  BID -  Oxycodone  2.5mg  q6h PRN   not using    5/28 having intermittent muscular pain it appears, left hip pain better   -continue above as well as heat/ice/rom/oob  6/13 pain controlled     -pt has clonus and is hyperreflexic LLE but ROM appears preserved   -continue to monitor  6/15 ongoing hypertonicity in LLE, heel cord is tight- started him on low dose baclofen yesterday   -told him he needs to be wearing PRAFO at night   -have reviewed basic heel cord stretches with him and wife   -tolerating baclofen so far 4. Mood/Behavior/Sleep: LCSW to follow for evaluation and support.              -antipsychotic agents: N/A  -down  to melatonin only at bedtime. Sleeping well. Very alert in am 5. Neuropsych/cognition: This patient is not capable of making decisions on his own behalf.  6. Skin/Wound Care: Routine pressure relief measures.   7. Fluids/Electrolytes/Nutrition: Monitor I/O. Monitor routine labs. Continue vitamins/supplements.  -Dys 3 diet with thins (SLP to eval)    8. Metabolic encephalopathy/ETOH withdrawal: Has resolved, wean seroquel  and ativan  as tolerated. Continue lactulose  30g BID   Off lactulose  no evidence of encephalopathy  9. Abnormal LFTs:  AST/ALT trending down.    -  Improved     10. ABLA?: Hgb 12's, monitor Hgb weekly   11. HTN: no meds PTA, started on amlodipine  10mg  daily, coreg  25mg  BID, losartan  100mg  daily. SBP goal <160 6/15 bp remains well controlled   Vitals:   01/22/24 1331 01/22/24 2041 01/23/24 0635 01/23/24 1320  BP: 104/67 110/70 107/60 114/78   01/23/24 1955 01/24/24 0538 01/24/24 1237 01/24/24 2008  BP: 117/74 119/78 114/75 106/79   01/25/24 0610 01/25/24 1433 01/25/24 2026 01/26/24 0552  BP: 113/75 119/76 118/77 105/64      12. Tobacco abuse: continue nicoderm patches.   13. GI ppx: continue Protonix  40mg  daily  14. Bowel management: continue Miralax  daily  -  LBM 5/29 , is now continent  -5/30 stools liquidy---back off miralax  -01/06/24 LBM this morning, not sure what type since not documented; monitor for now -LBM 6/17 type 5- requesting laxatives for hard stool  15. HLD: LDL 124, goal LDL <70, per d/c summary, start atorvastatin  40mg  once LFTs downtrend and repeat in 1 week -5/27 LFT's better yesterday -resumed atorvastatin    -LFTs cont to be improved as of 6/9  LOS: 27 days A FACE TO FACE EVALUATION WAS PERFORMED  Troy Bishop 01/26/2024, 8:54 AM

## 2024-01-27 NOTE — Plan of Care (Signed)
  Problem: Consults Goal: RH STROKE PATIENT EDUCATION Description: See Patient Education module for education specifics  Outcome: Progressing   Problem: RH BOWEL ELIMINATION Goal: RH STG MANAGE BOWEL WITH ASSISTANCE Description: STG Manage Bowel with supervision Assistance. Outcome: Progressing   Problem: RH BLADDER ELIMINATION Goal: RH STG MANAGE BLADDER WITH ASSISTANCE Description: STG Manage Bladder With  supervision Assistance Outcome: Progressing   Problem: RH SKIN INTEGRITY Goal: RH STG SKIN FREE OF INFECTION/BREAKDOWN Description: Manage skin free of infection/breakdown with supervision assistance Outcome: Progressing   Problem: RH SAFETY Goal: RH STG ADHERE TO SAFETY PRECAUTIONS W/ASSISTANCE/DEVICE Description: STG Adhere to Safety Precautions With supervision  Assistance/Device. Outcome: Progressing   Problem: RH PAIN MANAGEMENT Goal: RH STG PAIN MANAGED AT OR BELOW PT'S PAIN GOAL Description: < 4 w/prns Outcome: Progressing   Problem: RH KNOWLEDGE DEFICIT Goal: RH STG INCREASE KNOWLEDGE OF HYPERTENSION Description: Manage increase knowledge of hypertension with supervision assistance from wife using educational materials provided Outcome: Progressing Goal: RH STG INCREASE KNOWLEDGE OF DYSPHAGIA/FLUID INTAKE Outcome: Progressing Goal: RH STG INCREASE KNOWLEGDE OF HYPERLIPIDEMIA Description: Manage increase knowledge of hyperlipidemia  with supervision assistance from wife using educational materials provided Outcome: Progressing Goal: RH STG INCREASE KNOWLEDGE OF STROKE PROPHYLAXIS Description: Manage increase knowledge of stroke prophylaxis with supervision assistance from wife using educational materials provided Outcome: Progressing   Problem: RH Vision Goal: RH LTG Vision (Specify) Outcome: Progressing

## 2024-01-27 NOTE — Progress Notes (Signed)
 Occupational Therapy Session Note  Patient Details  Name: Troy Bishop MRN: [id number] Date of Birth: November 16, 1990  Today's Date: 01/27/2024 OT Individual Time: 1005-1105 OT Individual Time Calculation (min): 60 min    Short Term Goals: Week 4:  OT Short Term Goal 1 (Week 4): STG=LTG  Skilled Therapeutic Interventions/Progress Updates:    Pt greeted seated in recliner with spouse present and agreeable to OT treatment session. Pt ambulated to therapy gym with RW and L hand splint and min/CGA. L UE NMR with functional cup stacking activity with facilitation for wrist and hand extension, then pt able to grasp cone. OT assist to facilitate normal movement patterns and maintain wrist in position. E-stim placed on wrist extensors for 10 minutes. During off cycle, worked on finger flexion, then coming back out to extension. OT cut up large foam blocks and worked on thumb opposition. OT facilitation to open hand, then worked on finding pinch. Pt ambulated back to room without RW and min A. Pt left seated in recliner with spouse present and needs met.   Therapy Documentation Precautions:  Precautions Precautions: Fall Precaution/Restrictions Comments: L hemi that has improved since evaluation Restrictions Weight Bearing Restrictions Per Provider Order: No Pain:  Denies pain   Therapy/Group: Individual Therapy  Sharyle GORMAN Pert 01/27/2024, 11:37 AM

## 2024-01-27 NOTE — Progress Notes (Signed)
 Physical Therapy Session Note  Patient Details  Name: Troy Bishop MRN: [id number] Date of Birth: June 18, 1991  Today's Date: 01/27/2024 PT Individual Time: 0805-0918 PT Individual Time Calculation (min): 73 min   Short Term Goals: Week 4:  PT Short Term Goal 1 (Week 4): STG = LTG due to ELOS  Skilled Therapeutic Interventions/Progress Updates: Patient sitting EOB with wife present on entrance to room. Patient alert and agreeable to PT session.   Patient with no complaints of pain during session. Session focused on updating d/c summary for grad day PT. Pt and pt wife with no further concerns or questions, and thankful for pt's progress here at inpatient rehab.   Therapeutic Activity: Bed Mobility: Pt performed supine<>sit on EOB with supervision (also to R/L sidelying during MMT). VC required for pt to increase awareness to L UE.  Transfers: Pt performed sit<>stand transfers throughout session with RW in preparation for functional mobility with supervision. Provided VC for pt to increase awareness to L LE to ensure pt has retro-stepped enough to sit safely.  - Pt and pt wife participated in floor transfer with pt doing so with CGA when having surface to use to elevate self to standing, but min/modA when in needing another person. PTA encouraged pt to either scoot to stable furniture to assist to stand, or to have family member bring chair (family to stabilize) for pt to use with R UE. Pt and family understood.   Access Code: [email or web address] URL: [email or web address] Date: 01/27/2024 Prepared by: Delinda Bertrand  Exercises - Supine Bridge  - 1 x daily - 7 x weekly - 3 sets - 10 reps - Supine Single Leg Lift  - 1 x daily - 7 x weekly - 3 sets - 10 reps - Seated Hip Abduction  - 1 x daily - 7 x weekly - 3 sets - 10 reps  Gait Training:  Pt ambulated from room<main gym using RW with supervision and continued improvement in L LE neutral alignment during swing (maintaining increased hip  flexion vs external rotation and mild circumduction). Pt ambulated back to room without AD and with CGA to start, then progressed to close supervision without LOB and VC to increase weight shift to R.  Patient sitting in recliner at end of session with brakes locked, wife present, and all needs within reach.      Therapy Documentation Precautions:  Precautions Precautions: Fall Precaution/Restrictions Comments: L hemi that has improved since evaluation Restrictions Weight Bearing Restrictions Per Provider Order: No  Therapy/Group: Individual Therapy  Jeff Mccallum PTA 01/27/2024, 12:51 PM

## 2024-01-27 NOTE — Plan of Care (Signed)
  Problem: Consults Goal: RH STROKE PATIENT EDUCATION Description: See Patient Education module for education specifics  Outcome: Progressing   Problem: RH BOWEL ELIMINATION Goal: RH STG MANAGE BOWEL WITH ASSISTANCE Description: STG Manage Bowel with  supervision Assistance. Outcome: Progressing   Problem: RH BLADDER ELIMINATION Goal: RH STG MANAGE BLADDER WITH ASSISTANCE Description: STG Manage Bladder With supervision Assistance Outcome: Progressing   Problem: RH SKIN INTEGRITY Goal: RH STG SKIN FREE OF INFECTION/BREAKDOWN Description: Manage skin free of infection/breakdown with supervision assistance Outcome: Progressing   Problem: RH SAFETY Goal: RH STG ADHERE TO SAFETY PRECAUTIONS W/ASSISTANCE/DEVICE Description: STG Adhere to Safety Precautions With supervision Assistance/Device. Outcome: Progressing

## 2024-01-27 NOTE — Progress Notes (Signed)
 PROGRESS NOTE   Subjective/Complaints:  Pt doing fine, slept well, denies pain, LBM yesterday, urinating fine, no other complaints or concerns.   ROS: Patient with CP, SOB, abd pain, N/V/D/C  Objective:   No results found. No results for input(s): WBC, HGB, HCT, PLT in the last 72 hours.     No results for input(s): NA, K, CL, CO2, GLUCOSE, BUN, CREATININE, CALCIUM  in the last 72 hours.          Intake/Output Summary (Last 24 hours) at 01/27/2024 1303 Last data filed at 01/27/2024 0855 Gross per 24 hour  Intake 460 ml  Output 500 ml  Net -40 ml         Physical Exam: Vital Signs Blood pressure 102/63, pulse 79, temperature 99.5 F (37.5 C), temperature source Oral, resp. rate 18, height 5' 7 (1.702 m), weight 68.1 kg, SpO2 100%.   General: No acute distress, sitting up in chair Mood and affect are appropriate, happier lately Heart: Regular rate and rhythm no rubs murmurs or extra sounds Lungs: Clear to auscultation, breathing unlabored, no rales or wheezes Abdomen: Positive bowel sounds, soft nontender to palpation, nondistended Extremities: No clubbing, cyanosis, or edema Neuro: Alert and awake, dysarthric, L facial droop.   PRIOR EXAMS: Ext: Left foot , no skin lesions, no erythema or swelling, no pain with toe , midfoot, or ankle ROM Skin: No evidence of breakdown, no evidence of rash   Neuro: attention can come and go. Sometimes needs to be redirected, Alert and oriented x3    LLE 3- left hip add, 4- Left hip flexion, quad  and distally is 0/5, LUE 2- to 2/5  finger flexion, 1/5 wrist/finger extension. Right sided strength is 5/5. DTR's 3+ LLE, Sustained beats of clonus at ankle. Heel cord is tight. He's hyperreflexic LLE.  No clonus at Left wrist or finger flexors  Musc: no back pain or hip pain today. Normal ROM + 1FB sublux inferior Left shoulder no pain with ROM    Assessment/Plan: 1. Functional deficits which require 3+ hours per day of interdisciplinary therapy in a comprehensive inpatient rehab setting. Physiatrist is providing close team supervision and 24 hour management of active medical problems listed below. Physiatrist and rehab team continue to assess barriers to discharge/monitor patient progress toward functional and medical goals  Care Tool:  Bathing    Body parts bathed by patient: Left arm, Chest, Abdomen, Left upper leg, Right arm, Right lower leg, Left lower leg, Front perineal area, Buttocks, Right upper leg, Face   Body parts bathed by helper: Right arm     Bathing assist Assist Level: Minimal Assistance - Patient > 75%     Upper Body Dressing/Undressing Upper body dressing   What is the patient wearing?: Pull over shirt    Upper body assist Assist Level: Supervision/Verbal cueing    Lower Body Dressing/Undressing Lower body dressing      What is the patient wearing?: Underwear/pull up, Pants     Lower body assist Assist for lower body dressing: Moderate Assistance - Patient 50 - 74%     Toileting Toileting    Toileting assist Assist for toileting: Minimal Assistance - Patient > 75%  Transfers Chair/bed transfer  Transfers assist  Chair/bed transfer activity did not occur: Safety/medical concerns  Chair/bed transfer assist level: Contact Guard/Touching assist     Locomotion Ambulation   Ambulation assist   Ambulation activity did not occur: Safety/medical concerns  Assist level: Contact Guard/Touching assist Assistive device: Walker-rolling Max distance: 150   Walk 10 feet activity   Assist  Walk 10 feet activity did not occur: Safety/medical concerns  Assist level: Contact Guard/Touching assist Assistive device: Walker-rolling   Walk 50 feet activity   Assist Walk 50 feet with 2 turns activity did not occur: Safety/medical concerns  Assist level: Contact Guard/Touching  assist Assistive device: Walker-rolling    Walk 150 feet activity   Assist Walk 150 feet activity did not occur: Safety/medical concerns  Assist level: Contact Guard/Touching assist Assistive device: Walker-rolling    Walk 10 feet on uneven surface  activity   Assist Walk 10 feet on uneven surfaces activity did not occur: Safety/medical concerns         Wheelchair     Assist Is the patient using a wheelchair?: Yes Type of Wheelchair: Manual    Wheelchair assist level: Moderate Assistance - Patient 50 - 74% Max wheelchair distance: 150    Wheelchair 50 feet with 2 turns activity    Assist        Assist Level: Moderate Assistance - Patient 50 - 74%   Wheelchair 150 feet activity     Assist      Assist Level: Moderate Assistance - Patient 50 - 74%   Blood pressure 102/63, pulse 79, temperature 99.5 F (37.5 C), temperature source Oral, resp. rate 18, height 5' 7 (1.702 m), weight 68.1 kg, SpO2 100%.  Medical Problem List and Plan: 1. Functional deficits secondary to right basal ganglia large ICH 12/16/23             -patient may shower             -ELOS/Goals: 6/24 minA goals  --Continue CIR therapies including PT, OT, and SLP      F/u PCP Elsie Favorite PA-C 2.  Antithrombotics: -DVT/anticoagulation:  Pharmaceutical: SCDs, heparin  5000U q8h             -antiplatelet therapy: N/A  3. Pain Management/spasticity: Tylenol  prn. Continue gabapentin  200mg  BID -  Oxycodone  2.5mg  q6h PRN   not using    5/28 having intermittent muscular pain it appears, left hip pain better   -continue above as well as heat/ice/rom/oob  6/13 pain controlled     -pt has clonus and is hyperreflexic LLE but ROM appears preserved   -continue to monitor 6/15 ongoing hypertonicity in LLE, heel cord is tight- started him on low dose baclofen  yesterday   -told him he needs to be wearing PRAFO at night   -have reviewed basic heel cord stretches with him and  wife   -tolerating baclofen  so far 4. Mood/Behavior/Sleep: LCSW to follow for evaluation and support.              -antipsychotic agents: N/A  -down to melatonin only at bedtime. Sleeping well. Very alert in am 5. Neuropsych/cognition: This patient is not capable of making decisions on his own behalf.  6. Skin/Wound Care: Routine pressure relief measures.   7. Fluids/Electrolytes/Nutrition: Monitor I/O. Monitor routine labs. Continue vitamins/supplements.  -Dys 3 diet with thins (SLP to eval)    8. Metabolic encephalopathy/ETOH withdrawal: Has resolved, wean seroquel  and ativan  as tolerated. Continue lactulose  30g BID   Off lactulose   no evidence of encephalopathy  9. Abnormal LFTs:  AST/ALT trending down.    -Improved -- changed weekly CMPs to BMPs   10. ABLA?: Hgb 12's, monitor Hgb weekly   11. HTN: no meds PTA, started on amlodipine  10mg  daily, coreg  25mg  BID, losartan  100mg  daily. SBP goal <160 01/27/24 bp remains well controlled   Vitals:   01/23/24 1320 01/23/24 1955 01/24/24 0538 01/24/24 1237  BP: 114/78 117/74 119/78 114/75   01/24/24 2008 01/25/24 0610 01/25/24 1433 01/25/24 2026  BP: 106/79 113/75 119/76 118/77   01/26/24 0552 01/26/24 1617 01/26/24 2040 01/27/24 0657  BP: 105/64 103/67 123/83 102/63      12. Tobacco abuse: continue nicoderm patches.   13. GI ppx: continue Protonix  40mg  daily  14. Bowel management: continue Miralax  daily  -  LBM 5/29 , is now continent  -5/30 stools liquidy---back off miralax  -01/06/24 LBM this morning, not sure what type since not documented; monitor for now -LBM 6/17 type 5- requesting laxatives for hard stool -01/27/24 LBM yesterday, monitor  15. HLD: LDL 124, goal LDL <70, per d/c summary, start atorvastatin  40mg  once LFTs downtrend and repeat in 1 week -5/27 LFT's better yesterday -resumed atorvastatin    -LFTs cont to be improved as of 6/9  LOS: 28 days A FACE TO FACE EVALUATION WAS PERFORMED  [id number] Erlene Devita 01/27/2024,  1:03 PM

## 2024-01-27 NOTE — Progress Notes (Signed)
 Physical Therapy Discharge Summary  Patient Details  Name: Troy Bishop MRN: [id number] Date of Birth: [birth year]  Date of Discharge from PT service:January 27, 2024  Patient has met {NUMBERS 0-12:18577} of {NUMBERS 0-12:18577} long term goals due to {due un:6958322}.  Patient to discharge at [facility] level {LOA:3049010}.   Patient's care partner {care partner:3041650} to provide the necessary {assistance:3041652} assistance at discharge.  Reasons goals not met: ***  Recommendation:  Patient will benefit from ongoing skilled PT services in {setting:3041680} to continue to advance safe functional mobility, address ongoing impairments in ***, and minimize fall risk.  Equipment: {equipment:3041657}  Reasons for discharge: {Reason for discharge:3049018}  Patient/family agrees with progress made and goals achieved: {Pt/Family agree with progress/goals:3049020}  PT Discharge Precautions/Restrictions Precautions Precautions: Fall Precaution/Restrictions Comments: L hemi that has improved since evaluation Restrictions Weight Bearing Restrictions Per Provider Order: No Pain  *** Pain Interference  *** Vision/Perception    *** Cognition Overall Cognitive Status: Within Functional Limits for tasks assessed Arousal/Alertness: Awake/alert Orientation Level: Oriented X4 Attention: Selective;Sustained Sustained Attention: Appears intact Selective Attention: Impaired Memory: Impaired Memory Impairment: Decreased recall of new information Awareness: Impaired Awareness Impairment: Emergent impairment;Anticipatory impairment Problem Solving: Impaired Behaviors:  (impulsive behabior has improved since evaluation) Safety/Judgment: Impaired Sensation  *** Motor  Motor Motor: Other (comment);Abnormal tone;Abnormal postural alignment and control (Hemiparesis) Motor - Discharge Observations: Decreased motor control of maintaining neutral alignment of L hip during swing phase (has improved  since evaluation - and can correct with max cuing).  Mobility Bed Mobility Bed Mobility: Supine to Sit;Sit to Supine;Rolling Right;Rolling Left Rolling Right: Supervision/verbal cueing Rolling Left: Supervision/Verbal cueing Supine to Sit: Supervision/Verbal cueing Sit to Supine: Supervision/Verbal cueing Transfers Transfers: Sit to Stand;Stand to Sit;Stand Pivot Transfers Sit to Stand: Supervision/Verbal cueing Stand to Sit: Supervision/Verbal cueing Stand Pivot Transfers: Supervision/Verbal cueing Stand Pivot Transfer Details: Verbal cues for technique Transfer (Assistive device): Rolling walker Locomotion  Gait Ambulation: Yes Gait Assistance: Supervision/Verbal cueing Gait Distance (Feet): 150 Feet Assistive device: Rolling walker Gait Assistance Details: Verbal cues for gait pattern Gait Gait: Yes Gait Pattern: Impaired Gait Pattern: Step-through pattern;Decreased stance time - left;Poor foot clearance - left;Decreased hip/knee flexion - left;Decreased weight shift to right Gait velocity: decreased Stairs / Additional Locomotion Stairs: Yes Stairs Assistance: Contact Guard/Touching assist Stair Management Technique: Two rails Number of Stairs: 12 Height of Stairs: 6 Curb: Minimal Assistance - Patient >75% Wheelchair Mobility Wheelchair Mobility: No  Trunk/Postural Assessment  Cervical Assessment Cervical Assessment: Within Functional Limits Thoracic Assessment Thoracic Assessment: Within Functional Limits Lumbar Assessment Lumbar Assessment: Exceptions to [facility] (posterior pelvic tilt) Postural Control Trunk Control: push to L has improved since evaluation. Righting Reactions: decreased Protective Responses: decreased  Balance Balance Balance Assessed: Yes Static Sitting Balance Static Sitting - Balance Support: Feet supported Static Sitting - Level of Assistance: 5: Stand by assistance Dynamic Sitting Balance Dynamic Sitting - Balance Support: Feet  supported Dynamic Sitting - Level of Assistance: 5: Stand by assistance Static Standing Balance Static Standing - Balance Support: Right upper extremity supported Static Standing - Level of Assistance: 5: Stand by assistance Dynamic Standing Balance Dynamic Standing - Balance Support: During functional activity;Left upper extremity supported Dynamic Standing - Level of Assistance: 5: Stand by assistance Dynamic Standing - Balance Activities: Reaching across midline Extremity Assessment  LLE Assessment LLE Assessment: Exceptions to [facility] LLE Strength Left Hip Flexion: 3/5 (Supine in bed) Left Hip Extension: 2-/5 Left Hip ABduction: 2+/5 Left Hip ADduction: 2-/5 Left Knee Flexion: 2-/5 Left Knee Extension: 3-/5  Left Ankle Dorsiflexion: 1/5 Left Ankle Plantar Flexion: 2-/5   Shere Eisenhart PTA   01/27/2024, 8:46 AM

## 2024-01-28 NOTE — Plan of Care (Signed)
  Problem: Consults Goal: RH STROKE PATIENT EDUCATION Description: See Patient Education module for education specifics  Outcome: Adequate for Discharge   Problem: RH BOWEL ELIMINATION Goal: RH STG MANAGE BOWEL WITH ASSISTANCE Description: STG Manage Bowel with supervision Assistance. Outcome: Adequate for Discharge   Problem: RH BLADDER ELIMINATION Goal: RH STG MANAGE BLADDER WITH ASSISTANCE Description: STG Manage Bladder With  supervision Assistance Outcome: Adequate for Discharge   Problem: RH SKIN INTEGRITY Goal: RH STG SKIN FREE OF INFECTION/BREAKDOWN Description: Manage skin free of infection/breakdown with supervision assistance Outcome: Adequate for Discharge   Problem: RH SAFETY Goal: RH STG ADHERE TO SAFETY PRECAUTIONS W/ASSISTANCE/DEVICE Description: STG Adhere to Safety Precautions With supervision  Assistance/Device. Outcome: Adequate for Discharge

## 2024-01-28 NOTE — Progress Notes (Signed)
 PROGRESS NOTE   Subjective/Complaints:  Pt doing fine again today, slept well, pain well managed, LBM yesterday per pt but not documented, urinating fine, no other complaints or concerns.   ROS: Patient denies CP, SOB, abd pain, N/V/D/C  Objective:   No results found. No results for input(s): WBC, HGB, HCT, PLT in the last 72 hours.     No results for input(s): NA, K, CL, CO2, GLUCOSE, BUN, CREATININE, CALCIUM  in the last 72 hours.         No intake or output data in the 24 hours ending 01/28/24 1121        Physical Exam: Vital Signs Blood pressure 109/67, pulse 83, temperature 98.2 F (36.8 C), temperature source Oral, resp. rate 18, height 5' 7 (1.702 m), weight 68.1 kg, SpO2 100%.   General: No acute distress, resting in chair Mood and affect are appropriate, happier lately Heart: Regular rate and rhythm no rubs murmurs or extra sounds Lungs: Clear to auscultation, breathing unlabored, no rales or wheezes Abdomen: Positive bowel sounds, soft nontender to palpation, nondistended Extremities: No clubbing, cyanosis, or edema Neuro: Alert and awake, dysarthric, L facial droop. LLE and LUE weakness  PRIOR EXAMS: Ext: Left foot , no skin lesions, no erythema or swelling, no pain with toe , midfoot, or ankle ROM Skin: No evidence of breakdown, no evidence of rash   Neuro: attention can come and go. Sometimes needs to be redirected, Alert and oriented x3    LLE 3- left hip add, 4- Left hip flexion, quad  and distally is 0/5, LUE 2- to 2/5  finger flexion, 1/5 wrist/finger extension. Right sided strength is 5/5. DTR's 3+ LLE, Sustained beats of clonus at ankle. Heel cord is tight. He's hyperreflexic LLE.  No clonus at Left wrist or finger flexors  Musc: no back pain or hip pain today. Normal ROM + 1FB sublux inferior Left shoulder no pain with ROM   Assessment/Plan: 1. Functional  deficits which require 3+ hours per day of interdisciplinary therapy in a comprehensive inpatient rehab setting. Physiatrist is providing close team supervision and 24 hour management of active medical problems listed below. Physiatrist and rehab team continue to assess barriers to discharge/monitor patient progress toward functional and medical goals  Care Tool:  Bathing    Body parts bathed by patient: Left arm, Chest, Abdomen, Left upper leg, Right arm, Right lower leg, Left lower leg, Front perineal area, Buttocks, Right upper leg, Face   Body parts bathed by helper: Right arm     Bathing assist Assist Level: Minimal Assistance - Patient > 75%     Upper Body Dressing/Undressing Upper body dressing   What is the patient wearing?: Pull over shirt    Upper body assist Assist Level: Supervision/Verbal cueing    Lower Body Dressing/Undressing Lower body dressing      What is the patient wearing?: Underwear/pull up, Pants     Lower body assist Assist for lower body dressing: Moderate Assistance - Patient 50 - 74%     Toileting Toileting    Toileting assist Assist for toileting: Minimal Assistance - Patient > 75%     Transfers Chair/bed transfer  Transfers assist  Chair/bed transfer activity did not occur: Safety/medical concerns  Chair/bed transfer assist level: Supervision/Verbal cueing     Locomotion Ambulation   Ambulation assist   Ambulation activity did not occur: Safety/medical concerns  Assist level: Supervision/Verbal cueing Assistive device: Walker-rolling Max distance: 150+   Walk 10 feet activity   Assist  Walk 10 feet activity did not occur: Safety/medical concerns  Assist level: Supervision/Verbal cueing Assistive device: Walker-rolling   Walk 50 feet activity   Assist Walk 50 feet with 2 turns activity did not occur: Safety/medical concerns  Assist level: Supervision/Verbal cueing Assistive device: Walker-rolling    Walk 150  feet activity   Assist Walk 150 feet activity did not occur: Safety/medical concerns  Assist level: Supervision/Verbal cueing Assistive device: Walker-rolling    Walk 10 feet on uneven surface  activity   Assist Walk 10 feet on uneven surfaces activity did not occur: Safety/medical concerns   Assist level: Supervision/Verbal cueing Assistive device: Walker-rolling   Wheelchair     Assist Is the patient using a wheelchair?: No Type of Wheelchair: Manual    Wheelchair assist level: Moderate Assistance - Patient 50 - 74% Max wheelchair distance: 150    Wheelchair 50 feet with 2 turns activity    Assist        Assist Level: Moderate Assistance - Patient 50 - 74%   Wheelchair 150 feet activity     Assist      Assist Level: Moderate Assistance - Patient 50 - 74%   Blood pressure 109/67, pulse 83, temperature 98.2 F (36.8 C), temperature source Oral, resp. rate 18, height 5' 7 (1.702 m), weight 68.1 kg, SpO2 100%.  Medical Problem List and Plan: 1. Functional deficits secondary to right basal ganglia large ICH 12/16/23             -patient may shower             -ELOS/Goals: 6/24 minA goals  --Continue CIR therapies including PT, OT, and SLP    F/u PCP Elsie Favorite PA-C 2.  Antithrombotics: -DVT/anticoagulation:  Pharmaceutical: SCDs, heparin  5000U q8h             -antiplatelet therapy: N/A  3. Pain Management/spasticity: Tylenol  prn. Continue gabapentin  200mg  BID -  Oxycodone  2.5mg  q6h PRN   not using    5/28 having intermittent muscular pain it appears, left hip pain better   -continue above as well as heat/ice/rom/oob  6/13 pain controlled     -pt has clonus and is hyperreflexic LLE but ROM appears preserved   -continue to monitor 6/15 ongoing hypertonicity in LLE, heel cord is tight- started him on low dose baclofen  yesterday   -told him he needs to be wearing PRAFO at night   -have reviewed basic heel cord stretches with him and  wife   -tolerating baclofen  so far 4. Mood/Behavior/Sleep: LCSW to follow for evaluation and support.              -antipsychotic agents: N/A  -down to melatonin only at bedtime. Sleeping well. Very alert in am 5. Neuropsych/cognition: This patient is not capable of making decisions on his own behalf.  6. Skin/Wound Care: Routine pressure relief measures.   7. Fluids/Electrolytes/Nutrition: Monitor I/O. Monitor routine labs. Continue vitamins/supplements.  -Dys 3 diet with thins (SLP to eval)    8. Metabolic encephalopathy/ETOH withdrawal: Has resolved, wean seroquel  and ativan  as tolerated. Continue lactulose  30g BID   Off lactulose  no evidence of encephalopathy  9. Abnormal LFTs:  AST/ALT trending  down.    -Improved -- changed weekly CMPs to BMPs   10. ABLA?: Hgb 12's, monitor Hgb weekly   11. HTN: no meds PTA, started on amlodipine  10mg  daily, coreg  25mg  BID, losartan  100mg  daily. SBP goal <160 6/21-22/25 bp remains well controlled   Vitals:   01/24/24 1237 01/24/24 2008 01/25/24 0610 01/25/24 1433  BP: 114/75 106/79 113/75 119/76   01/25/24 2026 01/26/24 0552 01/26/24 1617 01/26/24 2040  BP: 118/77 105/64 103/67 123/83   01/27/24 0657 01/27/24 1456 01/27/24 2004 01/28/24 0544  BP: 102/63 102/65 116/67 109/67      12. Tobacco abuse: continue nicoderm patches.   13. GI ppx: continue Protonix  40mg  daily  14. Bowel management: continue Miralax  daily  -  LBM 5/29 , is now continent  -5/30 stools liquidy---back off miralax  -01/06/24 LBM this morning, not sure what type since not documented; monitor for now -LBM 6/17 type 5- requesting laxatives for hard stool -01/28/24 LBM yesterday per pt, not documented, monitor  15. HLD: LDL 124, goal LDL <70, per d/c summary, start atorvastatin  40mg  once LFTs downtrend and repeat in 1 week -5/27 LFT's better yesterday -resumed atorvastatin    -LFTs cont to be improved as of 6/9  LOS: 29 days A FACE TO FACE EVALUATION WAS  PERFORMED  [address] 01/28/2024, 11:21 AM

## 2024-01-29 ENCOUNTER — Other Ambulatory Visit (HOSPITAL_COMMUNITY): Payer: Self-pay

## 2024-01-29 DIAGNOSIS — G8194 Hemiplegia, unspecified affecting left nondominant side: Secondary | ICD-10-CM | POA: Insufficient documentation

## 2024-01-29 LAB — CBC WITH DIFFERENTIAL/PLATELET
Abs Immature Granulocytes: 0.02 10*3/uL (ref 0.00–0.07)
Basophils Absolute: 0 10*3/uL (ref 0.0–0.1)
Basophils Relative: 1 %
Eosinophils Absolute: 0.4 10*3/uL (ref 0.0–0.5)
Eosinophils Relative: 7 %
HCT: 34.2 % — ABNORMAL LOW (ref 39.0–52.0)
Hemoglobin: 11.5 g/dL — ABNORMAL LOW (ref 13.0–17.0)
Immature Granulocytes: 0 %
Lymphocytes Relative: 29 %
Lymphs Abs: 1.9 10*3/uL (ref 0.7–4.0)
MCH: 31.4 pg (ref 26.0–34.0)
MCHC: 33.6 g/dL (ref 30.0–36.0)
MCV: 93.4 fL (ref 80.0–100.0)
Monocytes Absolute: 0.9 10*3/uL (ref 0.1–1.0)
Monocytes Relative: 14 %
Neutro Abs: 3.2 10*3/uL (ref 1.7–7.7)
Neutrophils Relative %: 49 %
Platelets: 86 10*3/uL — ABNORMAL LOW (ref 150–400)
RBC: 3.66 MIL/uL — ABNORMAL LOW (ref 4.22–5.81)
RDW: 13 % (ref 11.5–15.5)
WBC: 6.5 10*3/uL (ref 4.0–10.5)
nRBC: 0 % (ref 0.0–0.2)

## 2024-01-29 LAB — BASIC METABOLIC PANEL WITH GFR
Anion gap: 5 (ref 5–15)
BUN: 11 mg/dL (ref 6–20)
CO2: 25 mmol/L (ref 22–32)
Calcium: 9.1 mg/dL (ref 8.9–10.3)
Chloride: 106 mmol/L (ref 98–111)
Creatinine, Ser: 0.72 mg/dL (ref 0.61–1.24)
GFR, Estimated: >60 mL/min (ref >60)
Glucose, Bld: 110 mg/dL — ABNORMAL HIGH (ref 70–99)
Potassium: 3.7 mmol/L (ref 3.5–5.1)
Sodium: 136 mmol/L (ref 135–145)

## 2024-01-29 MED ORDER — NICOTINE 21 MG/24HR TD PT24
21.0000 mg | MEDICATED_PATCH | Freq: Every day | TRANSDERMAL | 0 refills | Status: DC
  Filled 2024-01-29: qty 28, 28d supply, fill #0

## 2024-01-29 MED ORDER — CARVEDILOL 25 MG PO TABS
25.0000 mg | ORAL_TABLET | Freq: Two times a day (BID) | ORAL | 0 refills | Status: DC
  Filled 2024-01-29: qty 60, 30d supply, fill #0

## 2024-01-29 MED ORDER — MELATONIN 5 MG PO TABS
5.0000 mg | ORAL_TABLET | Freq: Every day | ORAL | 0 refills | Status: DC
  Filled 2024-01-29: qty 30, 30d supply, fill #0

## 2024-01-29 MED ORDER — LORAZEPAM 0.5 MG PO TABS
0.5000 mg | ORAL_TABLET | Freq: Three times a day (TID) | ORAL | 0 refills | Status: DC | PRN
  Filled 2024-01-29: qty 30, 10d supply, fill #0

## 2024-01-29 MED ORDER — NAPHAZOLINE-GLYCERIN 0.012-0.25 % OP SOLN
2.0000 [drp] | Freq: Three times a day (TID) | OPHTHALMIC | 0 refills | Status: AC
  Filled 2024-01-29: qty 30, 100d supply, fill #0

## 2024-01-29 MED ORDER — PANTOPRAZOLE SODIUM 40 MG PO TBEC
40.0000 mg | DELAYED_RELEASE_TABLET | Freq: Every day | ORAL | 0 refills | Status: DC
  Filled 2024-01-29: qty 30, 30d supply, fill #0

## 2024-01-29 MED ORDER — THIAMINE HCL 100 MG PO TABS
100.0000 mg | ORAL_TABLET | Freq: Every day | ORAL | 0 refills | Status: DC
  Filled 2024-01-29: qty 30, 30d supply, fill #0

## 2024-01-29 MED ORDER — ATORVASTATIN CALCIUM 40 MG PO TABS
40.0000 mg | ORAL_TABLET | Freq: Every day | ORAL | 0 refills | Status: DC
  Filled 2024-01-29: qty 30, 30d supply, fill #0

## 2024-01-29 MED ORDER — ADULT MULTIVITAMIN W/MINERALS CH
1.0000 | ORAL_TABLET | Freq: Every day | ORAL | 0 refills | Status: DC
  Filled 2024-01-29: qty 30, 30d supply, fill #0

## 2024-01-29 MED ORDER — GABAPENTIN 100 MG PO CAPS
200.0000 mg | ORAL_CAPSULE | Freq: Two times a day (BID) | ORAL | 0 refills | Status: DC
  Filled 2024-01-29: qty 120, 30d supply, fill #0

## 2024-01-29 MED ORDER — FOLIC ACID 1 MG PO TABS
1.0000 mg | ORAL_TABLET | Freq: Every day | ORAL | 0 refills | Status: DC
  Filled 2024-01-29: qty 30, 30d supply, fill #0

## 2024-01-29 MED ORDER — LOSARTAN POTASSIUM 100 MG PO TABS
100.0000 mg | ORAL_TABLET | Freq: Every day | ORAL | 0 refills | Status: DC
  Filled 2024-01-29: qty 30, 30d supply, fill #0

## 2024-01-29 MED ORDER — POLYETHYLENE GLYCOL 3350 17 GM/SCOOP PO POWD
17.0000 g | Freq: Every day | ORAL | 0 refills | Status: DC | PRN
  Filled 2024-01-29: qty 238, 14d supply, fill #0

## 2024-01-29 MED ORDER — POLYVINYL ALCOHOL 1.4 % OP SOLN
1.0000 [drp] | OPHTHALMIC | 0 refills | Status: AC | PRN
  Filled 2024-01-29: qty 15, fill #0

## 2024-01-29 MED ORDER — BACLOFEN 5 MG PO TABS
5.0000 mg | ORAL_TABLET | Freq: Two times a day (BID) | ORAL | 0 refills | Status: DC
Start: 2024-01-29 — End: 2024-03-04
  Filled 2024-01-29: qty 60, 30d supply, fill #0

## 2024-01-29 MED ORDER — AMLODIPINE BESYLATE 10 MG PO TABS
10.0000 mg | ORAL_TABLET | Freq: Every day | ORAL | 0 refills | Status: DC
  Filled 2024-01-29: qty 30, 30d supply, fill #0

## 2024-01-29 NOTE — Progress Notes (Signed)
 Occupational Therapy Discharge Summary  Patient Details  Name: Troy Bishop MRN: [id number] Date of Birth: 11/19/1990  Date of Discharge from OT service:January 29, 2024  Patient has met 15 of 16 long term goals due to improved balance, postural control, ability to compensate for deficits, functional use of  LEFT upper and LEFT lower extremity, improved attention, improved awareness, and improved coordination.  Patient to discharge at overall Supervision functional mobility to CGA/ min A level with self care.    Patient's care partner is independent to provide the necessary physical and cognitive assistance at discharge.  His wife has been present for all sessions and actively involved. She completed all family education.  Pt provided with HEP.  Reasons goals not met: Pt did not meet CGA with LB dressing as he continues to need mod A to manage shoes/AFO.  Recommendation:  Patient will benefit from ongoing skilled OT services in outpatient setting to continue to advance functional skills in the area of BADL, iADL, and Vocation.  Equipment: Emergency planning/management officer, BSC  Reasons for discharge: treatment goals met  Patient/family agrees with progress made and goals achieved: Yes  OT Discharge Precautions/Restrictions  Precautions Precautions: Fall Restrictions Weight Bearing Restrictions Per Provider Order: No  ADL ADL Eating: Independent Grooming: Independent Where Assessed-Grooming: Sitting at sink Upper Body Bathing: Supervision/safety Where Assessed-Upper Body Bathing: Shower Lower Body Bathing: Contact guard Where Assessed-Lower Body Bathing: Shower Upper Body Dressing: Supervision/safety Where Assessed-Upper Body Dressing: Edge of bed Lower Body Dressing: Minimal assistance Where Assessed-Lower Body Dressing: Edge of bed Toileting: Contact guard Where Assessed-Toileting: Teacher, adult education: Close supervision Toilet Transfer Method: Proofreader: Raised  toilet seat Tub/Shower Transfer: Close supervison Web designer Method: Ship broker: Sales promotion account executive Baseline Vision/History: 0 No visual deficits Patient Visual Report: Peripheral vision impairment Tracking/Visual Pursuits: Able to track stimulus in all quads without difficulty Convergence: Impaired (comment) (R eye does not converge) Visual Fields: Left visual field deficit Perception  Perception: Impaired Perception-Other Comments: L inattention Praxis Praxis: Impaired Praxis Impairment Details: Motor planning Cognition Cognition Overall Cognitive Status: Impaired/Different from baseline Arousal/Alertness: Awake/alert Memory: Impaired Memory Impairment: Decreased recall of new information;Decreased short term memory Decreased Short Term Memory: Verbal basic;Functional basic Attention: Selective;Sustained Sustained Attention: Appears intact Selective Attention: Appears intact Awareness: Impaired Awareness Impairment: Emergent impairment;Anticipatory impairment Problem Solving: Impaired Problem Solving Impairment: Verbal complex;Functional complex Brief Interview for Mental Status (BIMS) Repetition of Three Words (First Attempt): 3 Temporal Orientation: Year: Correct Temporal Orientation: Month: Accurate within 5 days Temporal Orientation: Day: Correct Recall: Sock: Yes, no cue required Recall: Blue: Yes, no cue required Recall: Bed: Yes, after cueing (a piece of furniture) BIMS Summary Score: 14 Sensation Sensation Light Touch Impaired Details: Impaired LLE;Impaired LUE Proprioception Impaired Details: Impaired LUE;Impaired LLE Stereognosis Impaired Details: Impaired LUE Motor  Motor Motor - Discharge Observations: Decreased motor control of maintaining neutral alignment of L hip during swing phase (has improved since evaluation - and can correct with max cuing). Trunk/Postural Assessment  Postural Control Trunk Control:  push to L has improved since evaluation. Righting Reactions: decreased Protective Responses: decreased  Balance Static Sitting Balance Static Sitting - Level of Assistance: 5: Stand by assistance Dynamic Sitting Balance Dynamic Sitting - Level of Assistance: 5: Stand by assistance Static Standing Balance Static Standing - Level of Assistance: 5: Stand by assistance Dynamic Standing Balance Dynamic Standing - Level of Assistance: 5: Stand by assistance Extremity/Trunk Assessment RUE Assessment RUE Assessment: Within Functional Limits LUE Assessment Brunstrum level  for arm: Stage III Synergy is performed voluntarily Brunstrum level for hand: Stage II Synergy is developing LUE Tone Elbow - Modified Ashworth Scale for Grading Hypertonia LUE: Slight increase in muscle tone, manifested by a catch, followed by minimal resistance throughout the remainder (less than half) of the ROM Wrist - Modified Ashworth Scale for Grading Hypertonia LUE: Slight increase in muscle tone, manifested by a catch and release or by minimal resistance at the end of the range of motion when the affected part(s) is moved in flexion or extension Fingers - Modified Ashworth Scale for Grading Hypertonia LUE: Slight increase in muscle tone, manifested by a catch, followed by minimal resistance throughout the remainder (less than half) of the ROM Thumb - Modified Ashworth Scale for Grading Hypertonia LUE: Slight increase in muscle tone, manifested by a catch and release or by minimal resistance at the end of the range of motion when the affected part(s) is moved in flexion or extension   FAST-UL Outcome Measure  Hand-to-mouth (HtM) Movement Starting Position: Participant seated on a standard chair without armrests. Trunk leaning on back support of chair. Both hands placed in pronated position on the ipsilateral middle thigh. Feet placed flat on the floor. If participants have any difficulty in understanding instructions  (i.e. aphasia) a visual demonstration is suggested. For each of the 5 tasks of the FAST-UL, the subject at first performs the movement with the less affected UL and then with the affected one. The movement can be repeated 3 times and the best score of the three attempts is assigned.   Instructions: Each subject is asked to move the hand towards the mouth, touch it with fingertips and return to the thigh. Motor task occurs without moving the trunk off the back support and without moving the head toward the hand.   Scoring: Clinical score from 0 to 3 is provided by comparing affected side with less affected one as follows: 0 = no movement at all. 1 = The movement task is not completed (less of 50% of the contralateral HtM movement). 2 = The movement task is not completed (more of 50% of the contralateral HtM movement but the mouth is not reached) or the movement task is completed with compensations. If the mouth is touched with the wrist or the palm or the movement is performed with head or trunk compensations (flexion of the head and trunk towards the hand) the score is 2.   3 = movement carried out at 100% of the contralateral HtM movement. HtM occurs with adequate shoulder flexion and abduction, elbow flexion, and forearm supination. The mouth is touched with fingertips.  Patient Score: 2   Reach to Target (RtT) Movement Starting Position: Same starting conditions of HtM movement. Instructions: Each subject is asked to move the hand toward a target (i.e. the hand of the examiner) located in front of the subject in the ipsilateral workspace at shoulder height, at a distance corresponding to 100% of the fully extended UL within arm's reach (less affected arm as reference). Participants have to reach, touch the target, and return. Motor task occurs without moving the trunk off the back support. Scoring: Clinical score from 0 to 3 is provided by comparing affected side with less affected one as  follows: 0 = no movement at all. 1 = The movement task is not completed (less of 50% of the contralateral RtT movement).  2 = The movement task is not completed (more of 50% of the contralateral RtT movement but the  target is not reached) or the movement task is completed with compensations (i.e. the trunk loses contact with the back support of the chair with forward displacement, shoulder flexion occurs with excessive scapular elevation, or shoulder excessive abduction). If the target is reached with trunk or shoulder compensations for inadequate elbow and finger extension the score is 2.  3 = movement performed at 100% of the contralateral RtT. The target is reached with adequate shoulder flexion, elbow, wrist and finger extension.  Patient Score: 1   Prono-supination (PS) Movement Starting Position: Same starting conditions of HtM movement. Instructions: Motor task occurs without moving the trunk anteriorly or laterally, the medial side of the humerus is against the body, the forearm is fully pronated with the hand resting on the thigh. Scoring: Clinical score from 0 to 3 is provided by comparing paretic side with less affected one as follows: 0 = no movement at all. 1 = The movement task is not completed (less of 50% of the contralateral PS movement).  2 = The movement task is not completed (more of 50% of the contralateral PS movement but the forearm is not fully supinated) or the movement task is completed with compensations (i.e. excessive trunk inclination, shoulder abduction). If the movement is completed with compensations at elbow, shoulder or trunk level the score is 2. 3 = movement performed at 100% of the contralateral PS (complete supination of the forearm with the dorsal part of the hand in contact with the thigh).   Patient Score: 0   Grasp and Release (GaR) Movement Starting position: Participant seated on a standard chair. Hip and knees in 90 flexion, feet flat on the  floor. Upper limb (UL) resting on a table in front of the participant with approximately 90 elbow flexion, forearm pronated and fingers in a relaxed extended and adducted position.  Instructions: The subject performs a grasping movement of a cylindrical rigid glass (at least 6 cm diameter) placed proximally to an imaginary line connecting the distal joints of thumb and index finger. The subject is asked to grasp the glass, lift it at least 2 cm (elbow remains in contact with the table), and release it. Scoring:  Clinical score from 0 to 3 is provided by comparing affected side with less affected one as follows: 0 = No movement. The grasp is not possible. 1 = The movement task is not completed (less of 50% of the task). Some prehension is possible but the grasp is not sufficiently stable to lift the object; the grasp can be performed with the use of the less affected hand only to stabilize the glass for inadequate hand/finger opening and the release is not possible. Some hand opening is required otherwise the score is 0. 2 = The movement task is not completed (more of 50% of the task). The object is grasped and lifted but it falls or the task is completed using alternative grasping strategies (i.e. multi-pulpar, palmar, digito palmar; grasping and releasing of the object is possible with abnormal orientation of the wrist and fingers toward the object and the forearm is lifted off the table). 3 = The task is completed using the expected pattern (normal orientation of fingers or wrist toward the object, the grasp occurs with thumb and fingers in opposition, forearm supination, elbow flexion; thumb abduction and finger extension to release the object).  Patient Score: 1   Pinch and Release (PaR) Movement Starting position: Same starting conditions of GaR movement The participant performs a PaR movement of a  pen placed on a table in the midline of an imaginary line connecting the distal joints of thumb and  index finger. The participants asked to pinch the pen with the tips of thumb and index finger, lift it at least 2 cm (elbow remains in contact with the table), and release it. Clinical score from 0 to 3 is provided by comparing affected side with non-affected one as follows: 0 = No movement. The pinch is not possible. 1 = The movement task is not completed (less of 50% of the task). Some prehension is possible but the pinch is not sufficiently stable to lift the object; the pinch occurs with the use of the less affected hand to stabilize the object for inadequate finger opening and the release is not possible. Some fingers movement is required otherwise the score is 0. 2 = The movement task is not completed (more of 50% of the task). The object is pinched and lifted but it falls or the task is completed using alternative pinching strategies (e.g. pinching with all the fingers, tripod pinch, pinching and releasing of the object is possible with abnormal orientation of fingers and wrist toward the object and the forearm is lifted off the table). 3 = The task is completed using the expected pattern (normal orientation of fingers or wrist toward the object, the pinch occurs with opposition of pads of index finger and thumb, and wrist extension).  Patient Score: 0  Total score: 4/15 vs 0 /15 on admission  He continues to have an anterior subluxation with low tone in shoulder and increased tone in finger flexors.     Glenden Rossell 01/29/2024, 3:04 PM

## 2024-01-29 NOTE — Progress Notes (Signed)
 Inpatient Rehabilitation Discharge Medication Review by a Pharmacist  A complete drug regimen review was completed for this patient to identify any potential clinically significant medication issues.  High Risk Drug Classes Is patient taking? Indication by Medication  Antipsychotic No   Anticoagulant No   Antibiotic No   Opioid No   Antiplatelet No   Hypoglycemics/insulin No   Vasoactive Medication Yes Amlodipine , Coreg , Losartan : blood pressure  Chemotherapy No   Other Yes Lipitor: HLD Ativan : mood Nicoderm: nicotine  dependence Protonix : GI ppx Miralax : bowel management Folic acid , MVI, thiamine : supplement Melatonin: sleep Baclofen : muscle spasms Gabapentin : pain Cosentyx: Psoriasis     Type of Medication Issue Identified Description of Issue Recommendation(s)  Drug Interaction(s) (clinically significant)     Duplicate Therapy     Allergy     No Medication Administration End Date     Incorrect Dose     Additional Drug Therapy Needed     Significant med changes from prior encounter (inform family/care partners about these prior to discharge).  Restart or discontinue as appropriate. Communicate medication changes with patient/family at discharge  Other  Patient not on Skyrizi, has been getting Cosentyx instead.     Clinically significant medication issues were identified that warrant physician communication and completion of prescribed/recommended actions by midnight of the next day:  No   Time spent performing this drug regimen review (minutes): 30  Thank you for allowing pharmacy to be a part of this patient's care.   Bascom JAYSON Louder, PharmD 01/30/2024 9:13 AM   **Pharmacist phone directory can be found on [email or web address] listed under [facility]**

## 2024-01-29 NOTE — Progress Notes (Signed)
 Speech Language Pathology Discharge Summary  Patient Details  Name: Troy Bishop MRN: [id number] Date of Birth: 05-05-1991  Date of Discharge from SLP service:January 29, 2024  Today's Date: 01/29/2024 SLP Individual Time: 0900-0958 SLP Individual Time Calculation (min): 58 min   Skilled Therapeutic Interventions:   Skilled therapy session focused on cognitive goals. SLP facilitated session through re-completion of medication management activity. SLP provided minA for patient to complete BID pillbox according to written directions. Patient with x1 mistake, though able to correct given minA. Patient independently attended to the L side of the pill box throughout activity. SLP targeted dysarthria goals through re-evaluation of maximum lingual strength. Patient with average maximum anterior kpa of 66 (45 at evaluation) and average maximum posterior kpa of 54 (35 at evaluation). Patient left in chair with call bell in reach and wife present. Continue POC.     Patient has met 6 of 6 long term goals.  Patient to discharge at overall Modified Independent;Min level.  Reasons goals not met: n/a   Clinical Impression/Discharge Summary:  Pt has made excellent gains and has met 6 of 6 LTG's this admission due to improved cognition, dysarthria and dysphagia. Pt is currently an overall minA for cognitive tasks and requires mod i cues for utilization of swallowing compensatory strategies to minimize overt s/sx of aspiration with regular/thin diet. Patient is now 90% intelligible at the phrase/sentence level given min multimodal A. Pt/family education complete and pt will discharge home with 24 hour supervision from friends/family/etc. Pt would benefit from [facility] f/u ST services to maximize cognition and dysarthria in order to maximize functional independence.    Care Partner:  Caregiver Able to Provide Assistance: Yes  Type of Caregiver Assistance: Physical;Cognitive  Recommendation:  Outpatient SLP;Home Health  SLP  Rationale for SLP Follow Up: Maximize cognitive function and independence   Equipment: n/a   Reasons for discharge: Treatment goals met;Discharged from hospital   Patient/Family Agrees with Progress Made and Goals Achieved: Yes    Alpheus Stiff M.A., CCC-SLP 01/29/2024, 12:16 PM

## 2024-01-29 NOTE — Progress Notes (Signed)
 Physical Therapy Session Note  Patient Details  Name: Troy Bishop MRN: [id number] Date of Birth: January 12, 1991  Today's Date: 01/29/2024 PT Individual Time: 9198-9155 + 1345-1445 PT Individual Time Calculation (min): 43 min  + 60 min  Short Term Goals: Week 4:  PT Short Term Goal 1 (Week 4): STG = LTG due to ELOS  Skilled Therapeutic Interventions/Progress Updates:      1st session: Pt sitting EOB with his partner present at bedside who assisted patient with donning socks, shoes, and AFO. She also assisted patient to the bathroom with RW while providing CGA to close supervision - demonstrating appropriate safety, guarding, and cues. Pt continent of bladder void - charted. Patient finishing his Ensure before leaving the room. Sit<>stand to RW with supervision. He ambulates with supervision and RW from his room to main gym, had x1 minor LOB while turning R that was self corrected. Vision/perception and sensory testing completing for DC - impaired touch on L hemibody on UE > LE with ~50% accuracy, L vision impairments with questionable lower quad vision impairment.   Five times Sit to Stand Test (FTSS) Method: Use a straight back chair with a solid seat that is 16-18" high. Ask participant to sit on the chair with arms folded across their chest.   Instructions: "Stand up and sit down as quickly as possible 5 times, keeping your arms folded across your chest."   Measurement: Stop timing when the participant stands the 5th time.  TIME: 19.5 (in seconds)  Times > 13.6 seconds is associated with increased disability and morbidity (Guralnik, 2000) Times > 15 seconds is predictive of recurrent falls in healthy individuals aged 49 and older (Buatois, et al., 2008) Normal performance values in community dwelling individuals aged 48 and older (Bohannon, 2006): 60-69 years: 11.4 seconds 70-79 years: 12.6 seconds 80-89 years: 14.8 seconds  MCID: >= 2.3 seconds for Vestibular Disorders (Meretta,  2006)  Pt requesting to ambulate without RW back to his room. CGA for ambulation 119ft towards his room - cues for increasing step length on L, widening BOS, and improving his forward gaze. Pt left sitting up in recliner with needs met, made aware of upcoming therapy schedule.    2nd session: Pt presents sitting in wheelchair and agreeable to therapy session. Pt has no complaints of pain. Sit<>stand to RW with supervision. He ambulates at supervision level using the RW to day room gym - cues for forward gaze and to keep posture awareness. Pt challenged with higher level dynamic gait training without UE support or AD. Obstacle course made for patient to work on non-linear gait, single leg toe taps, stepping over obstacles, navigating curbs, and standing on compliant surface while tossing horseshoes - pt needing minA overall for completing task and needing mod/maxA for balance recover when balance was lost. Pt then challenged with dynamic standing balance while kick soccer ball back and forth with SPT while PT provided min/modA for standing balance. Finished session with seated rolling chair scoots to challenge dynamic sitting balance, coordination, and hamstring strengthening. Pt needing modA overall for using legs to foot propel while sitting on rolling stool. Patient returned to his room and was left sitting up in recliner with his needs met.      Therapy Documentation Precautions:  Precautions Precautions: Fall Precaution/Restrictions Comments: L hemi that has improved since evaluation Restrictions Weight Bearing Restrictions Per Provider Order: No General:   Vital Signs: Therapy Vitals Temp: 97.7 F (36.5 C) Temp Source: Oral Pulse Rate: 76 Resp: 18 BP:  101/69 Patient Position (if appropriate): Lying Oxygen Therapy SpO2: 100 % O2 Device: Room Air Pain:   Mobility:   Locomotion :    Trunk/Postural Assessment :    Balance:   Exercises:   Other Treatments:       Therapy/Group: Individual Therapy  Sherlean SHAUNNA Perks 01/29/2024, 7:43 AM

## 2024-01-29 NOTE — Progress Notes (Signed)
 Patient ID: Troy Bishop, male   DOB: [birth year], 33 y.o.   MRN: [id number]  SW faxed outpatient PT/OT/SLP referral to [facility].   Graeme Jude, MSW, LCSW Office: [phone] Cell: [phone] Fax: [phone]

## 2024-01-29 NOTE — Discharge Summary (Signed)
 Physician Discharge Summary  Patient ID: Troy Bishop MRN: [id number] DOB/AGE: 34/08/1990 34 y.o.  Admit date: 12/30/2023 Discharge date: 01/30/2024  Discharge Diagnoses:  Principal Problem:   ICH (intracerebral hemorrhage) (HCC) Active Problems:   Acute blood loss anemia   Thrombocytopenia (HCC)   Anxiety state   Essential hypertension   Left hemiplegia (HCC)   Discharged Condition: stable  Significant Diagnostic Studies: No results found.  Labs:  Basic Metabolic Panel:    Latest Ref Rng & Units 01/29/2024    5:06 AM 01/22/2024    5:09 AM 01/17/2024    5:06 AM  BMP  Glucose 70 - 99 mg/dL 889  88    BUN 6 - 20 mg/dL 11  9    Creatinine 9.38 - 1.24 mg/dL 9.27  9.32    Sodium 864 - 145 mmol/L 136  140    Potassium 3.5 - 5.1 mmol/L 3.7  3.7  3.5   Chloride 98 - 111 mmol/L 106  104    CO2 22 - 32 mmol/L 25  26    Calcium  8.9 - 10.3 mg/dL 9.1  9.5       CBC:    Latest Ref Rng & Units 01/29/2024    5:06 AM 01/22/2024    5:09 AM 01/15/2024    5:01 AM  CBC  WBC 4.0 - 10.5 K/uL 6.5  6.1  5.8   Hemoglobin 13.0 - 17.0 g/dL 88.4  88.4  87.9   Hematocrit 39.0 - 52.0 % 34.2  34.5  36.3   Platelets 150 - 400 K/uL 86  100  93     Liver Function Tests:    Latest Ref Rng & Units 01/22/2024    5:09 AM 01/15/2024    5:01 AM 01/08/2024    5:19 AM  Hepatic Function  Total Protein 6.5 - 8.1 g/dL 7.5  7.8  7.6   Albumin 3.5 - 5.0 g/dL 3.5  3.6  3.3   AST 15 - 41 U/L 28  37  84   ALT 0 - 44 U/L 52  79  189   Alk Phosphatase 38 - 126 U/L 50  81  85   Total Bilirubin 0.0 - 1.2 mg/dL 0.7  0.8  0.6     CBG: No results for input(s): GLUCAP in the last 168 hours.  Brief HPI:   Troy Bishop is a 33 y.o. male with history of uncontrolled hypertension, thrombocytopenia, cirrhosis of the liver due to alcohol  abuse, alcohol  withdrawal seizures who was admitted on 12/16/2023 with acute onset of left-sided weakness with lethargy.  UDS showed alcohol  level at 264 and he was found to have acute large  right cerebral hematoma with active extravasation/#.  He was treated with 2 units FFP as repeat CT head showed increasing signs of hemorrhage.  Dr. Gillie recommended serial CT head for monitoring.  He was started on hypertonic saline for cerebral edema and Cleviprex  added for BP control.  He had issues with agitation due to alcohol  withdrawal requiring phenobarb taper as well as Precedex .  Follow-up MRI brain showed's stable hematoma centered in right basal ganglia extending to anterior right temporal lobe with extension of edema and 5 mm leftward shift.   Dr. Jerri felt that bleed likely hypertensive in nature.  Abnormal LFTs were being monitored and thrombocytopenia was stable.  He had reports of right hip pain with x-rays done which were negative for fracture or contusion.  Left upper extremity ultrasound done due to pain and showed superficial thrombosis  of left cephalic vein.  Mentation and activity tolerance were improving however he required plus to mod to max assist for standing with max cues for posture as well as max to total assist for ADLs.  He was independent working prior to admission.  CIR was recommended due to functional decline.     Hospital Course: Troy Bishop was admitted to rehab 12/30/2023 for inpatient therapies to consist of PT, ST and OT at least three hours five days a week. Past admission physiatrist, therapy team and rehab RN have worked together to provide customized collaborative inpatient rehab. His blood pressures were monitored on TID basis and blood pressures are well-controlled on current regimen.  He was educated on importance of medication compliance as well as alcohol  cessation due to history of cirrhosis as well as setting up his 6 months follow-up with GI.  BMET has been monitored during his stay and hypokalemia solved with brief supplementation.  Abnormal LFTs are resolving.  P.o. intake has been good renal status to be within normal limits.  CBC shows H&H to be stable with  ongoing thrombocytopenia.  He is continent of bowel and bladder.  He is noted to have 1 finger breaths subluxation inferior left shoulder but no pain with range of motion.  Baclofen  was added to help with hypertonicity LLE and tight heel cord.  PRAFO and basic heel cord stretches have also been reinforced with patient and wife.  He is tolerating baclofen  without any side effects.  Gabapentin  was added to help with dysesthesias.  Hip pain has been managed with local measures.  Family has been present for to provide ego support as well as help manage anxiety.  Dr. Rodenbough/neuropsychologist has followed up with patient and wife for recommendations on managing anxiety as well as focusing on his functional improvement.   Rehab course: During patient's stay in rehab weekly team conferences were held to monitor patient's progress, set goals and discuss barriers to discharge. At admission, patient required total assist with basic ADL tasks and with mobility.  He exhibited severe cognitive linguistic deficits affecting memory, attention, reasoning and judgment.  He exhibited oral phase dysphagia requiring D3 diet and full supervision for safety with intake. He  has had improvement in activity tolerance, balance, postural control as well as ability to compensate for deficits. He has had improvement in functional use LUE  and LLE as well as improvement in awareness. He is able to complete ADL tasks with CGA to min assist. He requires supervision for transfers and is able to ambulate 150 feet with use of rolling walker, supervision and verbal cues.  He requires min assist for cognitive tasks and cues for utilization of safe swallow strategies.  Speech is 90% intelligible at phrase and sentence level with minimal to moderate cues.  Family education has been completed.   Discharge disposition: 01-Home or Self Care  Diet: heart healthy  Special Instructions: No alcohol . No smoking.No driving.  Recommend repeat CBC  in 1-2 weeks to monitor H/H and platelets.   Discharge Instructions     Ambulatory referral to Neurology   Complete by: As directed    An appointment is requested in approximately: 4-6   Ambulatory referral to Occupational Therapy   Complete by: As directed    Eval and treat   Ambulatory referral to Physical Medicine Rehab   Complete by: As directed    Ambulatory referral to Physical Medicine Rehab   Complete by: As directed    Moderate complexity follow-up 1 to  2 weeks right basal ganglia ICH   Ambulatory referral to Physical Therapy   Complete by: As directed    Eval and treat   Ambulatory referral to Speech Therapy   Complete by: As directed    Eval and treat      Allergies as of 01/30/2024   No Known Allergies      Medication List     STOP taking these medications    Cosentyx (300 MG Dose) 150 MG/ML Sosy Generic drug: Secukinumab (300 MG Dose)   feeding supplement Liqd   lactulose  10 GM/15ML solution Commonly known as: CHRONULAC    QUEtiapine  50 MG tablet Commonly known as: SEROQUEL    thiamine  100 MG tablet Commonly known as: Vitamin B-1 Replaced by: thiamine  100 MG tablet       TAKE these medications    amLODipine  10 MG tablet Commonly known as: NORVASC  Take 1 tablet (10 mg total) by mouth daily.   artificial tears ophthalmic solution Place 1 drop into both eyes as needed for dry eyes.   atorvastatin  40 MG tablet Commonly known as: LIPITOR Take 1 tablet (40 mg total) by mouth daily.   Baclofen  5 MG Tabs Take 1 tablet (5 mg total) by mouth 2 (two) times daily.   carvedilol  25 MG tablet Commonly known as: COREG  Take 1 tablet (25 mg total) by mouth 2 (two) times daily with a meal.   CertaVite/Antioxidants Tabs Take 1 tablet by mouth daily.   folic acid  1 MG tablet Commonly known as: FOLVITE  Take 1 tablet (1 mg total) by mouth daily.   gabapentin  100 MG capsule Commonly known as: NEURONTIN  Take 2 capsules (200 mg total) by mouth 2 (two)  times daily.   LORazepam  0.5 MG tablet--Rx # 30 pills Commonly known as: ATIVAN  Take 1 tablet (0.5 mg total) by mouth every 8 (eight) hours as needed for anxiety.   losartan  100 MG tablet Commonly known as: COZAAR  Take 1 tablet (100 mg total) by mouth daily.   melatonin 5 MG Tabs Take 1 tablet (5 mg total) by mouth at bedtime.   naphazoline-glycerin  0.012-0.25 % Soln Commonly known as: CLEAR EYES REDNESS Place 2 drops into the left eye 3 (three) times daily.   nicotine  21 mg/24hr patch Commonly known as: NICODERM CQ  - dosed in mg/24 hours Place 1 patch (21 mg total) onto the skin daily.   pantoprazole  40 MG tablet Commonly known as: PROTONIX  Take 1 tablet (40 mg total) by mouth daily.   polyethylene glycol powder 17 GM/SCOOP powder Commonly known as: GLYCOLAX /MIRALAX  Take 17 g by mouth daily as needed for mild constipation.   Skyrizi Pen 150 MG/ML pen Generic drug: risankizumab-rzaa Inject 150 mg as directed every 3 (three) months. Notes to patient: Follow up with rheumatology for input on resumption.    thiamine  100 MG tablet Commonly known as: VITAMIN B1 Take 1 tablet (100 mg total) by mouth daily. Replaces: thiamine  100 MG tablet        Discharge Instructions     Ambulatory referral to Neurology   Complete by: As directed    An appointment is requested in approximately: 4-6   Ambulatory referral to Occupational Therapy   Complete by: As directed    Eval and treat   Ambulatory referral to Physical Medicine Rehab   Complete by: As directed    Ambulatory referral to Physical Medicine Rehab   Complete by: As directed    Moderate complexity follow-up 1 to 2 weeks right basal ganglia ICH  Ambulatory referral to Physical Therapy   Complete by: As directed    Eval and treat   Ambulatory referral to Speech Therapy   Complete by: As directed    Eval and treat        Follow-up Information     Kirsteins, Prentice BRAVO, MD Follow up.   Specialty: Physical  Medicine and Rehabilitation Why: office will call you with follow up appointment Contact information: [address] [place] [address] [phone]         [facility] Follow up.   Contact information: [address]     [address] [place]  [address] [phone]        Legrand Victory LITTIE DOUGLAS, MD Follow up.   Specialty: Gastroenterology Why: Call in 1-2 days for post hospital follow up Contact information: [address] [place] [address] [phone]         Troy Bishop Duncans, MD Follow up.   Specialty: Neurosurgery Why: Call in 1-2 days for post hospital follow up Contact information: [address] [place] [address] [phone]                  Signed: Sharlet GORMAN Schmitz 01/30/2024, 6:10 PM

## 2024-01-29 NOTE — Progress Notes (Signed)
 PROGRESS NOTE   Subjective/Complaints:  Seen in therapy, per PT now at supervision level   ROS: Patient denies CP, SOB, abd pain, N/V/D/C  Objective:   No results found. Recent Labs    01/29/24 0506  WBC 6.5  HGB 11.5*  HCT 34.2*  PLT 86*       Recent Labs    01/29/24 0506  NA 136  K 3.7  CL 106  CO2 25  GLUCOSE 110*  BUN 11  CREATININE 0.72  CALCIUM  9.1            Intake/Output Summary (Last 24 hours) at 01/29/2024 0845 Last data filed at 01/28/2024 1410 Gross per 24 hour  Intake 220 ml  Output --  Net 220 ml          Physical Exam: Vital Signs Blood pressure 101/69, pulse 76, temperature 97.7 F (36.5 C), temperature source Oral, resp. rate 18, height 5' 7 (1.702 m), weight 68.1 kg, SpO2 100%.   General: No acute distress, resting in chair Mood and affect are appropriate, happier lately Heart: Regular rate and rhythm no rubs murmurs or extra sounds Lungs: Clear to auscultation, breathing unlabored, no rales or wheezes Abdomen: Positive bowel sounds, soft nontender to palpation, nondistended Extremities: No clubbing, cyanosis, or edema Neuro: Alert and awake, dysarthric, L facial droop. LLE and LUE weakness  PRIOR EXAMS: Ext: Left foot , no skin lesions, no erythema or swelling, no pain with toe , midfoot, or ankle ROM Skin: No evidence of breakdown, no evidence of rash   Neuro: attention can come and go. Sometimes needs to be redirected, Alert and oriented x3     4- Left hip flexion, quad  and distally is 0/5, LUE 3-/5  finger flexion,, 3- delt and biceps 0/5 wrist/finger extension. Right sided strength is 5/5. DTR's 3+ LLE, Sustained beats of clonus at ankle. Heel cord is tight. He's hyperreflexic L finger and wrist flexors MAS 2  Musc: no back pain or hip pain today. Normal ROM + 1FB sublux inferior Left shoulder no pain with ROM   Assessment/Plan: 1. Functional deficits which  require 3+ hours per day of interdisciplinary therapy in a comprehensive inpatient rehab setting. Physiatrist is providing close team supervision and 24 hour management of active medical problems listed below. Physiatrist and rehab team continue to assess barriers to discharge/monitor patient progress toward functional and medical goals  Care Tool:  Bathing    Body parts bathed by patient: Left arm, Chest, Abdomen, Left upper leg, Right arm, Right lower leg, Left lower leg, Front perineal area, Buttocks, Right upper leg, Face   Body parts bathed by helper: Right arm     Bathing assist Assist Level: Minimal Assistance - Patient > 75%     Upper Body Dressing/Undressing Upper body dressing   What is the patient wearing?: Pull over shirt    Upper body assist Assist Level: Supervision/Verbal cueing    Lower Body Dressing/Undressing Lower body dressing      What is the patient wearing?: Underwear/pull up, Pants     Lower body assist Assist for lower body dressing: Moderate Assistance - Patient 50 - 74%     Toileting Toileting  Toileting assist Assist for toileting: Minimal Assistance - Patient > 75%     Transfers Chair/bed transfer  Transfers assist  Chair/bed transfer activity did not occur: Safety/medical concerns  Chair/bed transfer assist level: Supervision/Verbal cueing     Locomotion Ambulation   Ambulation assist   Ambulation activity did not occur: Safety/medical concerns  Assist level: Supervision/Verbal cueing Assistive device: Walker-rolling Max distance: 150+   Walk 10 feet activity   Assist  Walk 10 feet activity did not occur: Safety/medical concerns  Assist level: Supervision/Verbal cueing Assistive device: Walker-rolling   Walk 50 feet activity   Assist Walk 50 feet with 2 turns activity did not occur: Safety/medical concerns  Assist level: Supervision/Verbal cueing Assistive device: Walker-rolling    Walk 150 feet  activity   Assist Walk 150 feet activity did not occur: Safety/medical concerns  Assist level: Supervision/Verbal cueing Assistive device: Walker-rolling    Walk 10 feet on uneven surface  activity   Assist Walk 10 feet on uneven surfaces activity did not occur: Safety/medical concerns   Assist level: Supervision/Verbal cueing Assistive device: Walker-rolling   Wheelchair     Assist Is the patient using a wheelchair?: No Type of Wheelchair: Manual    Wheelchair assist level: Moderate Assistance - Patient 50 - 74% Max wheelchair distance: 150    Wheelchair 50 feet with 2 turns activity    Assist        Assist Level: Moderate Assistance - Patient 50 - 74%   Wheelchair 150 feet activity     Assist      Assist Level: Moderate Assistance - Patient 50 - 74%   Blood pressure 101/69, pulse 76, temperature 97.7 F (36.5 C), temperature source Oral, resp. rate 18, height 5' 7 (1.702 m), weight 68.1 kg, SpO2 100%.  Medical Problem List and Plan: 1. Functional deficits secondary to right basal ganglia large ICH 12/16/23             -patient may shower             -ELOS/Goals: 6/24 minA goals  --Continue CIR therapies including PT, OT, and SLP    F/u PCP Elsie Favorite PA-C 2.  Antithrombotics: -DVT/anticoagulation:  Pharmaceutical: SCDs, heparin  5000U q8h             -antiplatelet therapy: N/A  3. Pain Management/spasticity: Tylenol  prn. Continue gabapentin  200mg  BID -  Oxycodone  2.5mg  q6h PRN   not using    5/28 having intermittent muscular pain it appears, left hip pain better   -continue above as well as heat/ice/rom/oob  6/13 pain controlled     -pt has clonus and is hyperreflexic LLE but ROM appears preserved   -continue to monitor 6/15 ongoing hypertonicity in LLE, heel cord is tight- started him on low dose baclofen  yesterday   -told him he needs to be wearing PRAFO at night   -have reviewed basic heel cord stretches with him and  wife   -tolerating baclofen  so far 4. Mood/Behavior/Sleep: LCSW to follow for evaluation and support.              -antipsychotic agents: N/A  -down to melatonin only at bedtime. Sleeping well. Very alert in am 5. Neuropsych/cognition: This patient is not capable of making decisions on his own behalf.  6. Skin/Wound Care: Routine pressure relief measures.   7. Fluids/Electrolytes/Nutrition: Monitor I/O. Monitor routine labs. Continue vitamins/supplements.  -Dys 3 diet with thins (SLP to eval)    8. Metabolic encephalopathy/ETOH withdrawal: Has resolved, wean seroquel  and  ativan  as tolerated. Continue lactulose  30g BID   Off lactulose  no evidence of encephalopathy  9. Abnormal LFTs:  AST/ALT trending down.    -Improved -- changed weekly CMPs to BMPs   10. ABLA?: Hgb 12's, monitor Hgb weekly   11. HTN: no meds PTA, started on amlodipine  10mg  daily, coreg  25mg  BID, losartan  100mg  daily. SBP goal <160 6/21-22/25 bp remains well controlled   Vitals:   01/25/24 0610 01/25/24 1433 01/25/24 2026 01/26/24 0552  BP: 113/75 119/76 118/77 105/64   01/26/24 1617 01/26/24 2040 01/27/24 0657 01/27/24 1456  BP: 103/67 123/83 102/63 102/65   01/27/24 2004 01/28/24 0544 01/28/24 1406 01/29/24 0549  BP: 116/67 109/67 103/65 101/69      12. Tobacco abuse: continue nicoderm patches.   13. GI ppx: continue Protonix  40mg  daily  14. Bowel management: continue Miralax  daily  -  LBM 5/29 , is now continent  -5/30 stools liquidy---back off miralax  -01/06/24 LBM this morning, not sure what type since not documented; monitor for now -LBM 6/17 type 5- requesting laxatives for hard stool -01/28/24 LBM yesterday per pt, not documented, monitor  15. HLD: LDL 124, goal LDL <70, per d/c summary, start atorvastatin  40mg  once LFTs downtrend and repeat in 1 week -5/27 LFT's better yesterday -resumed atorvastatin    -LFTs cont to be improved as of 6/9  LOS: 30 days A FACE TO FACE EVALUATION WAS  PERFORMED  Prentice FORBES Compton 01/29/2024, 8:45 AM

## 2024-01-29 NOTE — Plan of Care (Signed)
  Problem: RH Balance Goal: LTG: Patient will maintain dynamic sitting balance (OT) Description: LTG:  Patient will maintain dynamic sitting balance with assistance during activities of daily living (OT) Outcome: Completed/Met Goal: LTG Patient will maintain dynamic standing with ADLs (OT) Description: LTG:  Patient will maintain dynamic standing balance with assist during activities of daily living (OT)  Outcome: Completed/Met   Problem: Sit to Stand Goal: LTG:  Patient will perform sit to stand in prep for activites of daily living with assistance level (OT) Description: LTG:  Patient will perform sit to stand in prep for activites of daily living with assistance level (OT) Outcome: Completed/Met   Problem: RH Eating Goal: LTG Patient will perform eating w/assist, cues/equip (OT) Description: LTG: Patient will perform eating with assist, with/without cues using equipment (OT) Outcome: Completed/Met   Problem: RH Grooming Goal: LTG Patient will perform grooming w/assist,cues/equip (OT) Description: LTG: Patient will perform grooming with assist, with/without cues using equipment (OT) Outcome: Completed/Met   Problem: RH Bathing Goal: LTG Patient will bathe all body parts with assist levels (OT) Description: LTG: Patient will bathe all body parts with assist levels (OT) Outcome: Completed/Met   Problem: RH Dressing Goal: LTG Patient will perform upper body dressing (OT) Description: LTG Patient will perform upper body dressing with assist, with/without cues (OT). Outcome: Completed/Met Goal: LTG Patient will perform lower body dressing w/assist (OT) Description: LTG: Patient will perform lower body dressing with assist, with/without cues in positioning using equipment (OT) Outcome: Adequate for Discharge   Problem: RH Toileting Goal: LTG Patient will perform toileting task (3/3 steps) with assistance level (OT) Description: LTG: Patient will perform toileting task (3/3 steps)  with assistance level (OT)  Outcome: Completed/Met   Problem: RH Vision Goal: RH LTG Vision (Specify) Outcome: Completed/Met   Problem: RH Functional Use of Upper Extremity Goal: LTG Patient will use RT/LT upper extremity as a (OT) Description: LTG: Patient will use right/left upper extremity as a stabilizer/gross assist/diminished/nondominant/dominant level with assist, with/without cues during functional activity (OT) Outcome: Completed/Met   Problem: RH Toilet Transfers Goal: LTG Patient will perform toilet transfers w/assist (OT) Description: LTG: Patient will perform toilet transfers with assist, with/without cues using equipment (OT) Outcome: Completed/Met   Problem: RH Tub/Shower Transfers Goal: LTG Patient will perform tub/shower transfers w/assist (OT) Description: LTG: Patient will perform tub/shower transfers with assist, with/without cues using equipment (OT) Outcome: Completed/Met   Problem: RH Memory Goal: LTG Patient will demonstrate ability for day to day recall/carry over during activities of daily living with assistance level (OT) Description: LTG:  Patient will demonstrate ability for day to day recall/carry over during activities of daily living with assistance level (OT). Outcome: Completed/Met   Problem: RH Attention Goal: LTG Patient will demonstrate this level of attention during functional activites (OT) Description: LTG:  Patient will demonstrate this level of attention during functional activites  (OT) Outcome: Completed/Met   Problem: RH Awareness Goal: LTG: Patient will demonstrate awareness during functional activites type of (OT) Description: LTG: Patient will demonstrate awareness during functional activites type of (OT) Outcome: Completed/Met

## 2024-01-29 NOTE — Plan of Care (Signed)
  Problem: RH Swallowing Goal: LTG Patient will consume least restrictive diet using compensatory strategies with assistance (SLP) Description: LTG:  Patient will consume least restrictive diet using compensatory strategies with assistance (SLP) Outcome: Completed/Met   Problem: RH Expression Communication Goal: LTG Patient will increase speech intelligibility (SLP) Description: LTG: Patient will increase speech intelligibility at word/phrase/conversation level with cues, % of the time (SLP) Outcome: Completed/Met   Problem: RH Problem Solving Goal: LTG Patient will demonstrate problem solving for (SLP) Description: LTG:  Patient will demonstrate problem solving for basic/complex daily situations with cues  (SLP) Outcome: Completed/Met   Problem: RH Memory Goal: LTG Patient will use memory compensatory aids to (SLP) Description: LTG:  Patient will use memory compensatory aids to recall biographical/new, daily complex information with cues (SLP) Outcome: Completed/Met   Problem: RH Attention Goal: LTG Patient will demonstrate this level of attention during functional activites (SLP) Description: LTG:  Patient will will demonstrate this level of attention during functional activites (SLP) Outcome: Completed/Met   Problem: RH Awareness Goal: LTG: Patient will demonstrate awareness during functional activites type of (SLP) Description: LTG: Patient will demonstrate awareness during functional activites type of (SLP) Outcome: Completed/Met

## 2024-01-29 NOTE — Progress Notes (Signed)
 Occupational Therapy Session Note  Patient Details  Name: Troy Bishop MRN: [id number] Date of Birth: [birth year]  Today's Date: 01/29/2024 OT Individual Time: 1052-1205 OT Individual Time Calculation (min): 73 min    Short Term Goals: Week 4:  OT Short Term Goal 1 (Week 4): STG=LTG  Skilled Therapeutic Interventions/Progress Updates:    Pt received in recliner in room with wife present. Pt very concerned about working on his LUE and having a specific exercise program to do at home. I had suggested that he complete a shower today but pt did not want to do one until the end of the day.  His wife stated she was comfortable assisting him with shower later today. To assess his level of function for today., pt agreeable to demonstrating how he doffs and dons clothing, he continues to need cues to fully attend to L side.  Pt ambulated from his room to tub room with RW and close supervision.  He worked on Chiropodist transfers with light CGA.  Discussed placement of curtain with bench to prevent water spillage, location of suggested bar and how to provide  CGA when he stands in shower to prevent falls.  He then ambulated to ADL apt and practiced sit to stands from low couch,  introduced pt to several exercises in sitting focusing on LUE wt bearing and proximal control followed by standing exercises with LUE on kitchen counter with wt bearing and proximal control. He continues to need A with guiding his arm and his wife practiced assisting him and guiding him.  Pt provided pt with a printed HEP.   He ambulated back to room with all needs met.   Therapy Documentation Precautions:  Precautions Precautions: Fall Precaution/Restrictions Comments: L hemi that has improved since evaluation Restrictions Weight Bearing Restrictions Per Provider Order: No   Pain: Pain Assessment Pain Score: 0-No pain ADL: ADL Eating: Independent Grooming: Independent Where Assessed-Grooming: Sitting at sink Upper Body  Bathing: Supervision/safety Where Assessed-Upper Body Bathing: Shower Lower Body Bathing: Contact guard Where Assessed-Lower Body Bathing: Shower Upper Body Dressing: Supervision/safety Where Assessed-Upper Body Dressing: Edge of bed Lower Body Dressing: Minimal assistance Where Assessed-Lower Body Dressing: Edge of bed Toileting: Contact guard Where Assessed-Toileting: Teacher, adult education: Close supervision Toilet Transfer Method: Proofreader: Raised toilet seat Tub/Shower Transfer: Close supervison Web designer Method: Ship broker: Insurance underwriter:  (had used stedy to bench, will start to use squat pivots)    Therapy/Group: Individual Therapy  Troy Bishop 01/29/2024, 12:57 PM

## 2024-01-30 NOTE — Progress Notes (Signed)
 Inpatient Rehabilitation Care Coordinator Discharge Note   Patient Details  Name: Troy Bishop MRN: [id number] Date of Birth: January 19, 1991   Discharge location: D/c tohome with his wife  Length of Stay: 30 days  Discharge activity level: ambulatory level CGA/Supervision  Home/community participation: Limited  Patient response un:Yzjouy Literacy - How often do you need to have someone help you when you read instructions, pamphlets, or other written material from your doctor or pharmacy?: Rarely  Patient response un:Dnrpjo Isolation - How often do you feel lonely or isolated from those around you?: Never  Services provided included: MD, RD, PT, OT, SLP, RN, CM, Pharmacy, Neuropsych, SW, TR  Financial Services:  Field seismologist Utilized: Media planner [place] Medicaid [employer] [place]  Choices offered to/list presented to: patient and wife  Follow-up services arranged:  DME, Outpatient    Outpatient Servicies: [facility] for PT/OT/SLP DME : [facility] for TTB and RW    Patient response to transportation need: Is the patient able to respond to transportation needs?: Yes In the past 12 months, has lack of transportation kept you from medical appointments or from getting medications?: No In the past 12 months, has lack of transportation kept you from meetings, work, or from getting things needed for daily living?: No   Patient/Family verbalized understanding of follow-up arrangements:  Yes  Individual responsible for coordination of the follow-up plan: contact pt wife Troy Bishop  Confirmed correct DME delivered: Troy Bishop 01/30/2024    Comments (or additional information):fam edu completed  Summary of Stay    Date/Time Discharge Planning CSW  01/24/24 1023 Pt will d/c to home with his wife; wife is primary caregiver of their four children. No other supports. SW will confirm there are no barriers to discharge. AAC  01/18/24 1618 Pt will d/c to home with  his wife; wife is primary caregiver of their four children. No other supports. SW will confirm there are no barriers to discharge. AAC  01/03/24 1023 Pt will d/c to home with his wife; wife is primary caregiver of their four children. No other supports. SW will confirm there are no barriers to discharge. AAC       Troy Bishop [name]

## 2024-01-30 NOTE — Progress Notes (Signed)
 PROGRESS NOTE   Subjective/Complaints:  Pt asked about PCP, had been seeing [facility], has Medicaid , but has seen Elsie Favorite PA-C in past   ROS: Patient denies CP, SOB, abd pain, N/V/D/C  Objective:   No results found. Recent Labs    01/29/24 0506  WBC 6.5  HGB 11.5*  HCT 34.2*  PLT 86*       Recent Labs    01/29/24 0506  NA 136  K 3.7  CL 106  CO2 25  GLUCOSE 110*  BUN 11  CREATININE 0.72  CALCIUM  9.1            Intake/Output Summary (Last 24 hours) at 01/30/2024 0810 Last data filed at 01/29/2024 1655 Gross per 24 hour  Intake 600 ml  Output --  Net 600 ml          Physical Exam: Vital Signs Blood pressure 116/86, pulse 76, temperature 98.6 F (37 C), temperature source Oral, resp. rate 17, height 5' 7 (1.702 m), weight 68.1 kg, SpO2 100%.   General: No acute distress, resting in chair Mood and affect are appropriate, happier lately Heart: Regular rate and rhythm no rubs murmurs or extra sounds Lungs: Clear to auscultation, breathing unlabored, no rales or wheezes Abdomen: Positive bowel sounds, soft nontender to palpation, nondistended Extremities: No clubbing, cyanosis, or edema Neuro: Alert and awake, dysarthric, L facial droop. LLE and LUE weakness  PRIOR EXAMS: Ext: Left foot , no skin lesions, no erythema or swelling, no pain with toe , midfoot, or ankle ROM Skin: No evidence of breakdown, no evidence of rash   Neuro: attention can come and go. Sometimes needs to be redirected, Alert and oriented x3     4- Left hip flexion, quad  and distally is 0/5, LUE 3-/5  finger flexion,, 3- delt and biceps 0/5 wrist/finger extension. Right sided strength is 5/5. DTR's 3+ LLE, Sustained beats of clonus at ankle. Heel cord is tight. He's hyperreflexic L finger and wrist flexors MAS 2  Musc: no back pain or hip pain today. Normal ROM + 1FB sublux inferior Left shoulder no pain  with ROM   Assessment/Plan: 1. Functional deficits which require 3+ hours per day of interdisciplinary therapy in a comprehensive inpatient rehab setting. Physiatrist is providing close team supervision and 24 hour management of active medical problems listed below. Physiatrist and rehab team continue to assess barriers to discharge/monitor patient progress toward functional and medical goals  Care Tool:  Bathing    Body parts bathed by patient: Left arm, Chest, Abdomen, Left upper leg, Right arm, Right lower leg, Left lower leg, Front perineal area, Buttocks, Right upper leg, Face   Body parts bathed by helper: Right arm     Bathing assist Assist Level: Contact Guard/Touching assist     Upper Body Dressing/Undressing Upper body dressing   What is the patient wearing?: Pull over shirt    Upper body assist Assist Level: Supervision/Verbal cueing    Lower Body Dressing/Undressing Lower body dressing      What is the patient wearing?: Underwear/pull up, Pants     Lower body assist Assist for lower body dressing: Minimal Assistance - Patient >  75%     Toileting Toileting    Toileting assist Assist for toileting: Contact Guard/Touching assist     Transfers Chair/bed transfer  Transfers assist  Chair/bed transfer activity did not occur: Safety/medical concerns  Chair/bed transfer assist level: Supervision/Verbal cueing     Locomotion Ambulation   Ambulation assist   Ambulation activity did not occur: Safety/medical concerns  Assist level: Supervision/Verbal cueing Assistive device: Walker-rolling Max distance: 150+   Walk 10 feet activity   Assist  Walk 10 feet activity did not occur: Safety/medical concerns  Assist level: Supervision/Verbal cueing Assistive device: Walker-rolling, Orthosis   Walk 50 feet activity   Assist Walk 50 feet with 2 turns activity did not occur: Safety/medical concerns  Assist level: Supervision/Verbal cueing Assistive  device: Walker-rolling, Orthosis    Walk 150 feet activity   Assist Walk 150 feet activity did not occur: Safety/medical concerns  Assist level: Supervision/Verbal cueing Assistive device: Walker-rolling, Orthosis    Walk 10 feet on uneven surface  activity   Assist Walk 10 feet on uneven surfaces activity did not occur: Safety/medical concerns   Assist level: Supervision/Verbal cueing Assistive device: Walker-rolling, Orthosis   Wheelchair     Assist Is the patient using a wheelchair?: No Type of Wheelchair: Manual    Wheelchair assist level: Moderate Assistance - Patient 50 - 74% Max wheelchair distance: 150    Wheelchair 50 feet with 2 turns activity    Assist        Assist Level: Moderate Assistance - Patient 50 - 74%   Wheelchair 150 feet activity     Assist      Assist Level: Moderate Assistance - Patient 50 - 74%   Blood pressure 116/86, pulse 76, temperature 98.6 F (37 C), temperature source Oral, resp. rate 17, height 5' 7 (1.702 m), weight 68.1 kg, SpO2 100%.  Medical Problem List and Plan: 1. Functional deficits secondary to right basal ganglia large ICH 12/16/23             -patient may shower             -ELOS/Goals: 6/24 minA goals  --Continue CIR therapies including PT, OT, and SLP    F/u PCP Elsie Favorite PA-C last seen in 2023 at [facility]- wife says that is too far but has made appt with [facility]  2.  Antithrombotics: -DVT/anticoagulation:  Pharmaceutical: SCDs, heparin  5000U q8h- d/c 6/24             -antiplatelet therapy: N/A  3. Pain Management/spasticity: Tylenol  prn. Continue gabapentin  200mg  BID -  Oxycodone  2.5mg  q6h PRN   not using    5/28 having intermittent muscular pain it appears, left hip pain better   -continue above as well as heat/ice/rom/oob  6/13 pain controlled     -pt has clonus and is hyperreflexic LLE but ROM appears preserved   -continue to monitor 6/15 ongoing hypertonicity in LLE, heel  cord is tight- started him on low dose baclofen  yesterday   -told him he needs to be wearing PRAFO at night   -have reviewed basic heel cord stretches with him and wife   -tolerating baclofen  so far 4. Mood/Behavior/Sleep: LCSW to follow for evaluation and support.              -antipsychotic agents: N/A  -down to melatonin only at bedtime. Sleeping well. Very alert in am 5. Neuropsych/cognition: This patient is not capable of making decisions on his own behalf.  6. Skin/Wound Care: Routine  pressure relief measures.   7. Fluids/Electrolytes/Nutrition: Monitor I/O. Monitor routine labs. Continue vitamins/supplements.  -Dys 3 diet with thins (SLP to eval)    8. Metabolic encephalopathy/ETOH withdrawal: Has resolved, wean seroquel  and ativan  as tolerated. Continue lactulose  30g BID   Off lactulose  no evidence of encephalopathy  9. Abnormal LFTs:  AST/ALT trending down.    -Improved -- changed weekly CMPs to BMPs   10. ABLA?: Hgb 12's, monitor Hgb weekly   11. HTN: no meds PTA, started on amlodipine  10mg  daily, coreg  25mg  BID, losartan  100mg  daily. SBP goal <160 6/24 bp remains well controlled   Vitals:   01/25/24 2026 01/26/24 0552 01/26/24 1617 01/26/24 2040  BP: 118/77 105/64 103/67 123/83   01/27/24 0657 01/27/24 1456 01/27/24 2004 01/28/24 0544  BP: 102/63 102/65 116/67 109/67   01/28/24 1406 01/29/24 0549 01/29/24 1304 01/29/24 2000  BP: 103/65 101/69 104/66 116/86      12. Tobacco abuse: continue nicoderm patches.   13. GI ppx: continue Protonix  40mg  daily  14. Bowel management: continue Miralax  daily  -  LBM 5/29 , is now continent  -5/30 stools liquidy---back off miralax  -01/06/24 LBM this morning, not sure what type since not documented; monitor for now -LBM 6/17 type 5- requesting laxatives for hard stool -01/28/24 LBM yesterday per pt, not documented, monitor  15. HLD: LDL 124, goal LDL <70, per d/c summary, cont atorvastatin  40mg    LOS: 31 days A FACE TO FACE  EVALUATION WAS PERFORMED  Prentice FORBES Compton 01/30/2024, 8:10 AM

## 2024-02-12 NOTE — Progress Notes (Signed)
 Subjective:    Patient ID: Troy Bishop, male    DOB: 1990-10-19, 33 y.o.   MRN: [id number]  HPI: Troy Bishop is a 33 y.o. male who is here for HU appointment for F/U of his ICH ( Intracerebral hemorrhage ) and essential Hypertension. He was brought to hospital on 12/16/2023 with acute onset of left sided weakness. Dr. Michaela H&P:  Troy Bishop is a 33 y.o. male with hx of hypertension, not taking medications, as well as a single previous seizure who presents with left-sided weakness.  He was last known well at 10 PM, and then stood up at 10:45 PM and developed left-sided weakness.  Due to the symptoms a code stroke was activated and the patient was evaluated emergently as a code stroke.  He was taken for CT which revealed a fairly large subcortical hemorrhage on the left.  Ct: Head: CTA: Head and Neck:  IMPRESSION: CT HEAD:   5.5 x 3.6 x 3.5 cm (estimated volume 35 mL) acute intraparenchymal hemorrhage involving the right frontotemporal region. Localized edema without significant midline shift.   CTA HEAD AND NECK:   1. Focus of contrast enhancement within the bed of the right cerebral hematoma, consistent with active contrast extravasation/spot sign. No other underlying vascular abnormality. 2. Otherwise negative CTA of the head and neck. No large vessel occlusion or other emergent finding. No hemodynamically significant or correctable stenosis.  MR Brain:  IMPRESSION: 1. Unchanged massive intracranial hemorrhage centered in the right basal ganglia, extending into the anterior right temporal lobe. Peripheral diffusion restriction, consistent with hemorrhagic ischemic infarct. 2. No abnormal contrast enhancement.  He was admitted to inpatient rehabilitation on 12/30/2023 and discharged home on o6/24/2025. He is scheduled for Neuro rehabilitation on 02/29/2024.  He states he has pain in his left shoulder and left arm. He rated his pain 0, on Health and History form.   Wife in room.      Pain Inventory Average Pain 0 Pain Right Now 0 My pain is sharp and aching  LOCATION OF PAIN  left shoulder, left leg, left arm, left hip, left thigh, left side of face,   BOWEL Number of stools per week: 7 Oral laxative use Yes  Type of laxative Miralax  when needed. Enema or suppository use No  History of colostomy No  Incontinent No   BLADDER Normal   Mobility use a cane ability to climb steps?  no do you drive?  no Do you have any goals in this area?  yes  Function disabled: date disabled applied I need assistance with the following:  dressing, bathing, toileting, meal prep, household duties, and shopping Do you have any goals in this area?  yes  Neuro/Psych weakness dizziness confusion depression anxiety  Prior Studies Any changes since last visit?  no  Physicians involved in your care Any changes since last visit?  no   Family History  Problem Relation Age of Onset   Esophageal cancer Neg Hx    Liver disease Neg Hx    Colon cancer Neg Hx    Social History   Socioeconomic History   Marital status: Married    Spouse name: Not on file   Number of children: 4   Years of education: Not on file   Highest education level: Not on file  Occupational History   Not on file  Tobacco Use   Smoking status: Former    Current packs/day: 0.00    Types: Cigarettes    Quit date: 05/13/2014  Years since quitting: 9.7   Smokeless tobacco: Never  Vaping Use   Vaping status: Never Used  Substance and Sexual Activity   Alcohol  use: Yes    Comment: 4 times/week   Drug use: No   Sexual activity: Not on file  Other Topics Concern   Not on file  Social History Narrative   ** Merged History Encounter **       Social Drivers of Health   Financial Resource Strain: Not on file  Food Insecurity: Patient Unable To Answer (12/16/2023)   Hunger Vital Sign    Worried About Running Out of Food in the Last Year: Patient unable to answer    Ran Out of Food in  the Last Year: Patient unable to answer  Transportation Needs: Patient Unable To Answer (12/16/2023)   PRAPARE - Transportation    Lack of Transportation (Medical): Patient unable to answer    Lack of Transportation (Non-Medical): Patient unable to answer  Physical Activity: Not on file  Stress: Not on file  Social Connections: Not on file   Past Surgical History:  Procedure Laterality Date   BREATH SHIRLEAN DEL PYLORI N/A 09/08/2016   Procedure: BREATH TEK DEL LORA;  Surgeon: Victory LITTIE Legrand DOUGLAS, MD;  Location: [facility];  Service: Gastroenterology;  Laterality: N/A;   HARDWARE REMOVAL Left 07/27/2017   Procedure: Removal of deep implants right proximal and distal tibia;  Surgeon: Kit Rush, MD;  Location: [facility];  Service: Orthopedics;  Laterality: Left;   TENDON REPAIR Left 08/14/2014   Procedure: LEFT HAND WOUND EXPLORATION AND TENDON REPAIR;  Surgeon: Prentice LELON Pagan, MD;  Location: [facility];  Service: Orthopedics;  Laterality: Left;   TIBIA IM NAIL INSERTION  07/31/2012   Procedure: INTRAMEDULLARY (IM) NAIL TIBIAL;  Surgeon: Rush Kit, MD;  Location: [facility];  Service: Orthopedics;  Laterality: Left;   UPPER GI ENDOSCOPY  07/25/2016   Past Medical History:  Diagnosis Date   Liver disease    Painful orthopaedic hardware (HCC) 07/2017   right tibia   Verruca vulgaris 2023   penile mass   There were no vitals taken for this visit.  Opioid Risk Score:   Fall Risk Score:  `1  Depression screen [facility] 2/9     09/28/2017    4:24 PM 09/06/2017    3:56 PM  Depression screen PHQ 2/9  Decreased Interest 0 0  Down, Depressed, Hopeless 0 0  PHQ - 2 Score 0 0    Review of Systems  Neurological:  Positive for dizziness and weakness.  Psychiatric/Behavioral:  Positive for confusion.        Depression, Anxiety  All other systems reviewed and are negative.      Objective:   Physical Exam Vitals and nursing note reviewed.  Constitutional:       Appearance: Normal appearance.  Cardiovascular:     Rate and Rhythm: Normal rate and regular rhythm.     Pulses: Normal pulses.     Heart sounds: Normal heart sounds.  Pulmonary:     Effort: Pulmonary effort is normal.     Breath sounds: Normal breath sounds.  Musculoskeletal:     Comments: Normal Muscle Bulk and Muscle Testing Reveals:  Upper Extremities:Right: Full ROM and Muscle Strength 5/5 Left Upper Extremity: Decreased ROM 90 Degrees  and Muscle Strength  3/5  Lower Extremities : Right: Full ROM and Muscle Strength 5/5 Left: Full ROM and Muscle Strength 5/5 Wearing Left AFO Arises from Table  slowly using cane for support     Skin:    General: Skin is warm and dry.  Neurological:     Mental Status: He is alert and oriented to person, place, and time.  Psychiatric:        Mood and Affect: Mood normal.        Behavior: Behavior normal.          Assessment & Plan:  ICH ( Intracerebral hemorrhage )Left Hemiplegia: He has a scheduled appointment with Neurology,  Scheduled for Neuro-Rehabilitation on 02/29/2024 2. Essential Hypertension: Continue current medication regimen: He has a scheduled appointment on 02/26/2024, Continue to Monitor.   F/U with Dr Carilyn in 4- 6 weeks

## 2024-02-13 ENCOUNTER — Encounter: Attending: Registered Nurse | Admitting: Registered Nurse

## 2024-02-13 VITALS — BP 122/83 | HR 87 | Ht <= 58 in | Wt 141.0 lb

## 2024-02-13 DIAGNOSIS — I10 Essential (primary) hypertension: Secondary | ICD-10-CM | POA: Diagnosis not present

## 2024-02-13 DIAGNOSIS — I619 Nontraumatic intracerebral hemorrhage, unspecified: Secondary | ICD-10-CM

## 2024-02-13 DIAGNOSIS — G8194 Hemiplegia, unspecified affecting left nondominant side: Secondary | ICD-10-CM | POA: Insufficient documentation

## 2024-02-16 ENCOUNTER — Telehealth: Payer: Self-pay

## 2024-02-16 NOTE — Telephone Encounter (Signed)
 Patients spouse calling into the office to ask about recent office visit and returning to work was mentioned in the last office visit.  Per clinical staff more information can be provided during the next office follow up.  Inquiring due to disability forms.  Spouse verbalized understanding.

## 2024-02-18 ENCOUNTER — Encounter: Payer: Self-pay | Admitting: Registered Nurse

## 2024-02-26 ENCOUNTER — Encounter: Payer: Self-pay | Admitting: Family Medicine

## 2024-02-26 ENCOUNTER — Ambulatory Visit: Admitting: Family Medicine

## 2024-02-26 VITALS — BP 118/72 | HR 88 | Temp 98.0°F | Ht 65.0 in | Wt 141.0 lb

## 2024-02-26 DIAGNOSIS — D696 Thrombocytopenia, unspecified: Secondary | ICD-10-CM | POA: Diagnosis not present

## 2024-02-26 DIAGNOSIS — E7800 Pure hypercholesterolemia, unspecified: Secondary | ICD-10-CM | POA: Diagnosis not present

## 2024-02-26 DIAGNOSIS — Z7689 Persons encountering health services in other specified circumstances: Secondary | ICD-10-CM | POA: Diagnosis not present

## 2024-02-26 DIAGNOSIS — I10 Essential (primary) hypertension: Secondary | ICD-10-CM

## 2024-02-26 DIAGNOSIS — L299 Pruritus, unspecified: Secondary | ICD-10-CM

## 2024-02-26 LAB — CBC WITH DIFFERENTIAL/PLATELET
Basophils Absolute: 0 K/uL (ref 0.0–0.1)
Basophils Relative: 0.6 % (ref 0.0–3.0)
Eosinophils Absolute: 0.3 K/uL (ref 0.0–0.7)
Eosinophils Relative: 6.3 % — ABNORMAL HIGH (ref 0.0–5.0)
HCT: 36.7 % — ABNORMAL LOW (ref 39.0–52.0)
Hemoglobin: 12.3 g/dL — ABNORMAL LOW (ref 13.0–17.0)
Lymphocytes Relative: 26 % (ref 12.0–46.0)
Lymphs Abs: 1.4 K/uL (ref 0.7–4.0)
MCHC: 33.4 g/dL (ref 30.0–36.0)
MCV: 92.3 fl (ref 78.0–100.0)
Monocytes Absolute: 0.5 K/uL (ref 0.1–1.0)
Monocytes Relative: 9.4 % (ref 3.0–12.0)
Neutro Abs: 3 K/uL (ref 1.4–7.7)
Neutrophils Relative %: 57.7 % (ref 43.0–77.0)
Platelets: 85 K/uL — ABNORMAL LOW (ref 150.0–400.0)
RBC: 3.97 Mil/uL — ABNORMAL LOW (ref 4.22–5.81)
RDW: 13.8 % (ref 11.5–15.5)
WBC: 5.3 K/uL (ref 4.0–10.5)

## 2024-02-26 LAB — COMPREHENSIVE METABOLIC PANEL WITH GFR
ALT: 20 U/L (ref 0–53)
AST: 17 U/L (ref 0–37)
Albumin: 4.7 g/dL (ref 3.5–5.2)
Alkaline Phosphatase: 64 U/L (ref 39–117)
BUN: 13 mg/dL (ref 6–23)
CO2: 29 meq/L (ref 19–32)
Calcium: 9.7 mg/dL (ref 8.4–10.5)
Chloride: 101 meq/L (ref 96–112)
Creatinine, Ser: 0.67 mg/dL (ref 0.40–1.50)
GFR: 121.78 mL/min (ref >60.00)
Glucose, Bld: 135 mg/dL — ABNORMAL HIGH (ref 70–99)
Potassium: 3.7 meq/L (ref 3.5–5.1)
Sodium: 138 meq/L (ref 135–145)
Total Bilirubin: 0.7 mg/dL (ref 0.2–1.2)
Total Protein: 8 g/dL (ref 6.0–8.3)

## 2024-02-26 MED ORDER — HYDROXYZINE PAMOATE 25 MG PO CAPS: 25.0000 mg | ORAL_CAPSULE | Freq: Three times a day (TID) | ORAL | 0 refills | Status: AC | PRN

## 2024-02-26 NOTE — Progress Notes (Signed)
 New Patient Office Visit  Subjective    Patient ID: Troy Bishop, male    DOB: 11-24-1990  Age: 33 y.o. MRN: [id number]  CC:  Chief Complaint  Patient presents with   Establish Care    HPI Troy Bishop presents to establish care. He is accompanied by his wife. During exam, they were poor historians with conflicting information given.   Previous primary care- [facility]  Specialist- neurology, GI  [facility]  Dermatology- [facility] swayze rierson Gastroenterologist- victory Brand MD  Neurology-Yijun Onita MD Phys med- Prentice Compton  PT rehab- chloe Gilgannon  OT rehab Geny Szabela  Speech rehab- laura lovvorn   Covid-19 vaccines- got first series no boosters   Ranell distance with history of hypertension, thrombocytopenia, cirrhosis of the liver due to alcohol  abuse, alcohol  withdrawal, and seizures was admitted to the ED under code stroke on Dec 16, 2023. He presented with left-sided weakness after a single seizure. A code stroke was called in the ED and a CT revealed a large subcortical hemorrhage on the left. He was then admitted to the ICU and discharged to rehab on 5/24 for inpatient therapies  Alcohol  use- Hospital recommended that the patient not drink alcohol . Patient states that he is drinking beer.  Hypertension- He is currently taking prescribed amlodipine  10mg , losartan  100mg , and carvedilol  25mg  for hypertension. BP Readings from Last 3 Encounters:  02/26/24 118/72  02/13/24 122/83  01/29/24 116/86    Hyperlipidemia- He is currently taking prescribed atorvastatin  40mg . \  Lab Results  Component Value Date   CHOL 247 (H) 12/17/2023   HDL 78 12/17/2023   LDLCALC 124 (H) 12/17/2023   TRIG 224 (H) 12/17/2023   CHOLHDL 3.2 12/17/2023    hypertonicity and dysesthesias- Baclofen  was added in the hospital to help with hypertonicity LLE and tight heel cord. Gabapentin  was added in the hospital to help with  dysesthesias.  Anxiety- Patient had a prescription for ativan  at discharge.The prescription was for a 10 day supply on 6/23. Patient and wife are unsure if he is still taking prescription at this time.  Outpatient Encounter Medications as of 02/26/2024  Medication Sig   amLODipine  (NORVASC ) 10 MG tablet Take 1 tablet (10 mg total) by mouth daily.   artificial tears ophthalmic solution Place 1 drop into both eyes as needed for dry eyes.   atorvastatin  (LIPITOR) 40 MG tablet Take 1 tablet (40 mg total) by mouth daily.   Baclofen  5 MG TABS Take 1 tablet (5 mg total) by mouth 2 (two) times daily.   carvedilol  (COREG ) 25 MG tablet Take 1 tablet (25 mg total) by mouth 2 (two) times daily with a meal.   folic acid  (FOLVITE ) 1 MG tablet Take 1 tablet (1 mg total) by mouth daily.   gabapentin  (NEURONTIN ) 100 MG capsule Take 2 capsules (200 mg total) by mouth 2 (two) times daily.   hydrOXYzine  (VISTARIL ) 25 MG capsule Take 1 capsule (25 mg total) by mouth every 8 (eight) hours as needed for itching.   LORazepam  (ATIVAN ) 0.5 MG tablet Take 1 tablet (0.5 mg total) by mouth every 8 (eight) hours as needed for anxiety.   losartan  (COZAAR ) 100 MG tablet Take 1 tablet (100 mg total) by mouth daily.   Multiple Vitamin (MULTIVITAMIN WITH MINERALS) TABS tablet Take 1 tablet by mouth daily.   naphazoline-glycerin  (CLEAR EYES REDNESS) 0.012-0.25 % SOLN Place 2 drops into the left eye 3 (three) times daily.   pantoprazole  (PROTONIX )  40 MG tablet Take 40 mg by mouth daily.   risankizumab-rzaa (SKYRIZI PEN) 150 MG/ML pen Inject 150 mg as directed every 3 (three) months.   thiamine  (VITAMIN B1) 100 MG tablet Take 1 tablet (100 mg total) by mouth daily.   [DISCONTINUED] melatonin 5 MG TABS Take 1 tablet (5 mg total) by mouth at bedtime.   [DISCONTINUED] nicotine  (NICODERM CQ  - DOSED IN MG/24 HOURS) 21 mg/24hr patch Place 1 patch (21 mg total) onto the skin daily. (Patient not taking: Reported on 02/13/2024)    [DISCONTINUED] pantoprazole  (PROTONIX ) 40 MG tablet Take 1 tablet (40 mg total) by mouth daily.   [DISCONTINUED] polyethylene glycol powder (GLYCOLAX /MIRALAX ) 17 GM/SCOOP powder Take 17 g by mouth daily as needed for mild constipation.   No facility-administered encounter medications on file as of 02/26/2024.    Past Medical History:  Diagnosis Date   Hyperlipidemia    Hypertension    Liver disease    Painful orthopaedic hardware (HCC) 07/2017   right tibia   Seizures (HCC)    Stroke (cerebrum) (HCC)    Thrombocytopenia (HCC)    Verruca vulgaris 2023   penile mass    Past Surgical History:  Procedure Laterality Date   BREATH TEK H PYLORI N/A 09/08/2016   Procedure: BREATH TEK VEAR LORA;  Surgeon: Victory LITTIE Legrand DOUGLAS, MD;  Location: [facility];  Service: Gastroenterology;  Laterality: N/A;   HARDWARE REMOVAL Left 07/27/2017   Procedure: Removal of deep implants right proximal and distal tibia;  Surgeon: Kit Rush, MD;  Location: [facility];  Service: Orthopedics;  Laterality: Left;   TENDON REPAIR Left 08/14/2014   Procedure: LEFT HAND WOUND EXPLORATION AND TENDON REPAIR;  Surgeon: Prentice LELON Pagan, MD;  Location: [facility];  Service: Orthopedics;  Laterality: Left;   TIBIA IM NAIL INSERTION  07/31/2012   Procedure: INTRAMEDULLARY (IM) NAIL TIBIAL;  Surgeon: Rush Kit, MD;  Location: [facility];  Service: Orthopedics;  Laterality: Left;   UPPER GI ENDOSCOPY  07/25/2016    Family History  Problem Relation Age of Onset   Hypertension Mother    Hypertension Father    Hypertension Maternal Grandfather    Esophageal cancer Neg Hx    Liver disease Neg Hx    Colon cancer Neg Hx     Social History   Socioeconomic History   Marital status: Married    Spouse name: Not on file   Number of children: 4   Years of education: Not on file   Highest education level: Not on file  Occupational History   Not on file  Tobacco Use   Smoking status: Former     Current packs/day: 0.00    Types: Cigarettes    Quit date: 05/13/2014    Years since quitting: 9.7   Smokeless tobacco: Former  Building services engineer status: Never Used  Substance and Sexual Activity   Alcohol  use: Yes    Comment: occasionally   Drug use: No   Sexual activity: Not Currently  Other Topics Concern   Not on file  Social History Narrative   ** Merged History Encounter **       Social Drivers of Health   Financial Resource Strain: Low Risk  (02/26/2024)   Overall Financial Resource Strain (CARDIA)    Difficulty of Paying Living Expenses: Not hard at all  Food Insecurity: No Food Insecurity (02/26/2024)   Hunger Vital Sign    Worried About Running Out of Food in the  Last Year: Never true    Ran Out of Food in the Last Year: Never true  Transportation Needs: No Transportation Needs (02/26/2024)   PRAPARE - Administrator, Civil Service (Medical): No    Lack of Transportation (Non-Medical): No  Physical Activity: Inactive (02/26/2024)   Exercise Vital Sign    Days of Exercise per Week: 0 days    Minutes of Exercise per Session: 0 min  Stress: No Stress Concern Present (02/26/2024)   [employer] of Occupational Health - Occupational Stress Questionnaire    Feeling of Stress: Only a little  Social Connections: Moderately Integrated (02/26/2024)   Social Connection and Isolation Panel    Frequency of Communication with Friends and Family: More than three times a week    Frequency of Social Gatherings with Friends and Family: Once a week    Attends Religious Services: More than 4 times per year    Active Member of [employer] or Organizations: No    Attends Banker Meetings: Never    Marital Status: Married  Catering manager Violence: Not At Risk (02/26/2024)   Humiliation, Afraid, Rape, and Kick questionnaire    Fear of Current or Ex-Partner: No    Emotionally Abused: No    Physically Abused: No    Sexually Abused: No    Review of Systems   Eyes:  Negative for blurred vision.  Respiratory:  Negative for shortness of breath.   Cardiovascular:  Negative for chest pain and palpitations.  Gastrointestinal:  Negative for constipation, diarrhea, nausea and vomiting.  Neurological:  Positive for dizziness (occ with position changes). Negative for headaches.   Objective    BP 118/72   Pulse 88   Temp 98 F (36.7 C) (Oral)   Ht 5' 5 (1.651 m)   Wt 141 lb (64 kg)   SpO2 97%   BMI 23.46 kg/m   Physical Exam Constitutional:      Appearance: Normal appearance.  HENT:     Head: Normocephalic and atraumatic.  Eyes:     Extraocular Movements: Extraocular movements intact.     Conjunctiva/sclera: Conjunctivae normal.  Cardiovascular:     Rate and Rhythm: Normal rate and regular rhythm.     Pulses: Normal pulses.     Heart sounds: Normal heart sounds.  Pulmonary:     Effort: Pulmonary effort is normal.     Breath sounds: Normal breath sounds.  Musculoskeletal:        General: Normal range of motion.     Cervical back: Normal range of motion.  Skin:    General: Skin is warm and dry.     Capillary Refill: Capillary refill takes less than 2 seconds.  Neurological:     Mental Status: He is alert.  Psychiatric:     Comments: Language barrier     Assessment & Plan:   Problem List Items Addressed This Visit       Cardiovascular and Mediastinum   Essential hypertension   Relevant Orders   CMP     Hematopoietic and Hemostatic   Thrombocytopenia (HCC) - Primary   Relevant Orders   CBC with Differential/Platelets     Other   Encounter to establish care   Other Visit Diagnoses       Itching       Relevant Medications   hydrOXYzine  (VISTARIL ) 25 MG capsule     High cholesterol       Relevant Orders   CMP      Language  barrier - Strongly encouraged an interpreter due to language barrier. Patient refused multiple times.   Alcohol  Use -Recommend alcohol  cessation due to health status.   Essential  Hypertension -Blood pressure stable today om amlodipine  10mg , losartan  100mg  and carvedilol  25mg .  - Labs ordered (CMP) to assess liver and kidney function. - Continue taking prescribed amlodipine  10mg , losartan  100mg  and carvedilol  25mg .   Thrombocytopenia  - History of thrombocytopenia. On 01/29/24 platelets were 86. Labs ordered (CBC) to assess platelets, hemoglobin, RBC's, and WBCs.  Hyperlipidemia - Continue taking prescribed atorvastatin  40mg .  Itching  - Hydroxyzine  25mg  sent to preferred pharmacy. Take 1 tablet as needed at bedtime for itching.   Anxiety - Patient requested a refill on Ativan . Educated patient that I would order a psychiatry referral. Patient denies psychiatry referral.   Post stroke rehabilitation and appointments - Continue seeing specialists and participating in OT, PT and speech language therapy.  Return in about 4 weeks (around 03/25/2024).   Lum KANDICE Quan, RN

## 2024-02-26 NOTE — Patient Instructions (Signed)
 It was a pleasure seeing you today!   -Recommend alcohol  cessation due to health status.  -Blood pressure stable today on amlodipine  10mg , losartan  100mg  and carvedilol  25mg .  - Labs ordered (CMP) to assess liver and kidney function. - Continue taking prescribed amlodipine  10mg , losartan  100mg  and carvedilol  25mg .  - History of thrombocytopenia. Labs ordered (CBC) to assess platelets, hemoglobin, RBC's, and WBCs. - Continue taking prescribed atorvastatin  40mg  for high cholesterol. - Hydroxyzine  25mg  sent to preferred pharmacy. Take 1 tablet as needed at bedtime for itching.  -  I can order a psychiatry referral if you feel that you need it for anxiety.   Post stroke rehabilitation and appointments - Continue seeing specialists and participating in OT, PT and speech language therapy.

## 2024-02-27 ENCOUNTER — Ambulatory Visit

## 2024-02-27 ENCOUNTER — Other Ambulatory Visit: Payer: Self-pay | Admitting: Family Medicine

## 2024-02-27 ENCOUNTER — Ambulatory Visit: Payer: Self-pay | Admitting: Family Medicine

## 2024-02-27 DIAGNOSIS — D649 Anemia, unspecified: Secondary | ICD-10-CM

## 2024-02-27 DIAGNOSIS — D696 Thrombocytopenia, unspecified: Secondary | ICD-10-CM

## 2024-02-27 MED ORDER — THIAMINE HCL 100 MG PO TABS: 100.0000 mg | ORAL_TABLET | Freq: Every day | ORAL | 0 refills | Status: DC

## 2024-02-27 MED ORDER — ATORVASTATIN CALCIUM 40 MG PO TABS: 40.0000 mg | ORAL_TABLET | Freq: Every day | ORAL | 0 refills | Status: DC

## 2024-02-27 NOTE — Telephone Encounter (Signed)
 Copied from CRM [id number]. Topic: Clinical - Medication Refill >> Feb 27, 2024  2:43 PM Suzen RAMAN wrote: Medication: atorvastatin  (LIPITOR) 40 MG tablet thiamine  (VITAMIN B1) 100 MG tablet   Has the patient contacted their pharmacy? Yes   This is the patient's preferred pharmacy:  [id number] [place], [place] - [address] AT [facility] [address] [facility] [address] [place] [address] Phone: [phone] Fax: [phone]   Is this the correct pharmacy for this prescription? Yes If no, delete pharmacy and type the correct one.   Has the prescription been filled recently? No  Is the patient out of the medication? No  Has the patient been seen for an appointment in the last year OR does the patient have an upcoming appointment? Yes  Can we respond through MyChart? Yes  Agent: Please be advised that Rx refills may take up to 3 business days. We ask that you follow-up with your pharmacy.

## 2024-02-27 NOTE — Telephone Encounter (Signed)
 Last OV 02/26/2024

## 2024-02-28 ENCOUNTER — Other Ambulatory Visit: Payer: Self-pay | Admitting: Family Medicine

## 2024-02-28 ENCOUNTER — Ambulatory Visit: Payer: Self-pay | Admitting: Family Medicine

## 2024-02-28 LAB — IRON,TIBC AND FERRITIN PANEL
%SAT: 27 % (ref 20–48)
Ferritin: 201 ng/mL (ref 38–380)
Iron: 98 ug/dL (ref 50–180)
TIBC: 366 ug/dL (ref 250–425)

## 2024-02-29 ENCOUNTER — Encounter: Payer: Self-pay | Admitting: Speech Pathology

## 2024-02-29 ENCOUNTER — Ambulatory Visit: Admitting: Occupational Therapy

## 2024-02-29 ENCOUNTER — Other Ambulatory Visit: Payer: Self-pay

## 2024-02-29 ENCOUNTER — Ambulatory Visit: Attending: Physician Assistant | Admitting: Physical Therapy

## 2024-02-29 ENCOUNTER — Ambulatory Visit: Admitting: Speech Pathology

## 2024-02-29 ENCOUNTER — Encounter: Payer: Self-pay | Admitting: Physical Therapy

## 2024-02-29 ENCOUNTER — Telehealth: Payer: Self-pay | Admitting: Occupational Therapy

## 2024-02-29 VITALS — BP 109/60 | HR 66

## 2024-02-29 DIAGNOSIS — R41842 Visuospatial deficit: Secondary | ICD-10-CM

## 2024-02-29 DIAGNOSIS — R29818 Other symptoms and signs involving the nervous system: Secondary | ICD-10-CM

## 2024-02-29 DIAGNOSIS — M6281 Muscle weakness (generalized): Secondary | ICD-10-CM | POA: Diagnosis present

## 2024-02-29 DIAGNOSIS — R29898 Other symptoms and signs involving the musculoskeletal system: Secondary | ICD-10-CM | POA: Diagnosis present

## 2024-02-29 DIAGNOSIS — R471 Dysarthria and anarthria: Secondary | ICD-10-CM

## 2024-02-29 DIAGNOSIS — R278 Other lack of coordination: Secondary | ICD-10-CM

## 2024-02-29 DIAGNOSIS — I629 Nontraumatic intracranial hemorrhage, unspecified: Secondary | ICD-10-CM

## 2024-02-29 DIAGNOSIS — R2689 Other abnormalities of gait and mobility: Secondary | ICD-10-CM

## 2024-02-29 DIAGNOSIS — R2681 Unsteadiness on feet: Secondary | ICD-10-CM | POA: Diagnosis present

## 2024-02-29 NOTE — Patient Instructions (Signed)
 Slow Loud Over enunciate  Pause  10 loud Ah for 10 seconds  Count 3x  1 2 3 4 5  6 7 8 9 10  11 12 13 14 15  16 17 18 19 20   Days of the week 3x  Months  Read aloud 15 sentences on the sheet or anything you have at home to read for 3 minutes with loud volume   Do exercises 20x each, 3x a day - in the mirror, slow and big  1. Alternate pucker and smile - OOO-EEE  2. Open mouth big Ahh-OOO with mouth open big  3. Pucker and move your lips side to side  4. Puff up your cheeks with air BIG - swish air from side to side  5. Press lips together flat and pop them open   6. Pucker and kiss big  7. Hold tongue depressor in your lips (no teeth) out straight - hold for    ----- minutes

## 2024-02-29 NOTE — Telephone Encounter (Signed)
 Hello,   Based on the pt's presentation during OT sessions, they would benefit from a bedside commode. It looks like this was recommended in the hospital but was never delivered.  If approved, please send order to DME company of choice.   Thank you!   Jocelyn Bottom, OTR/L 02/29/2024 [facility] Phone: [phone] Fax: [phone]
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
## 2024-03-04 ENCOUNTER — Other Ambulatory Visit: Payer: Self-pay | Admitting: Family Medicine

## 2024-03-04 NOTE — Telephone Encounter (Unsigned)
 Copied from CRM [id number]. Topic: Clinical - Medication Refill >> Mar 04, 2024 10:08 AM Burnard DEL wrote: Medication: amLODipine  (NORVASC ) 10 MG tablet (Expired)  losartan  (COZAAR ) 100 MG tablet(was prescribed by doctor while in the hospital)  folic acid  (FOLVITE ) 1 MG tablet  Certabite   Baclofen  5 MG TABS  thiamine  (VITAMIN B1) 100 MG tablet  pantoprazole  (PROTONIX ) 40 MG tablet  Has the patient contacted their pharmacy? No (Agent: If no, request that the patient contact the pharmacy for the refill. If patient does not wish to contact the pharmacy document the reason why and proceed with request.) (Agent: If yes, when and what did the pharmacy advise?)  This is the patient's preferred pharmacy:  [id number] [address], [place] - [address] AT [facility] [address] [facility] [address] [place] [address] Phone: [phone] Fax: [phone]    Is this the correct pharmacy for this prescription? Yes If no, delete pharmacy and type the correct one.   Has the prescription been filled recently? No  Is the patient out of the medication? Yes  Has the patient been seen for an appointment in the last year OR does the patient have an upcoming appointment? Yes  Can we respond through MyChart? Yes  Agent: Please be advised that Rx refills may take up to 3 business days. We ask that you follow-up with your pharmacy.

## 2024-03-06 ENCOUNTER — Other Ambulatory Visit: Payer: Self-pay | Admitting: Family Medicine

## 2024-03-06 ENCOUNTER — Telehealth: Payer: Self-pay | Admitting: Family Medicine

## 2024-03-06 MED ORDER — AMLODIPINE BESYLATE 10 MG PO TABS: 10.0000 mg | ORAL_TABLET | Freq: Every day | ORAL | 0 refills | Status: DC

## 2024-03-06 MED ORDER — FOLIC ACID 1 MG PO TABS: 1.0000 mg | ORAL_TABLET | Freq: Every day | ORAL | 0 refills | Status: DC

## 2024-03-06 MED ORDER — LOSARTAN POTASSIUM 100 MG PO TABS
100.0000 mg | ORAL_TABLET | Freq: Every day | ORAL | 0 refills | Status: DC
Start: 2024-03-06 — End: 2024-03-06

## 2024-03-06 MED ORDER — ADULT MULTIVITAMIN W/MINERALS CH: 1.0000 | ORAL_TABLET | Freq: Every day | ORAL | 0 refills | Status: AC

## 2024-03-06 MED ORDER — PANTOPRAZOLE SODIUM 40 MG PO TBEC: 40.0000 mg | DELAYED_RELEASE_TABLET | Freq: Every day | ORAL | 0 refills | Status: DC

## 2024-03-06 MED ORDER — BACLOFEN 5 MG PO TABS: 5.0000 mg | ORAL_TABLET | Freq: Two times a day (BID) | ORAL | 0 refills | Status: DC

## 2024-03-06 NOTE — Telephone Encounter (Signed)
 Paperwork picked up and handed off to provider.

## 2024-03-06 NOTE — Telephone Encounter (Signed)
 Patient dropped off document disabled adult waiver disclosure, to be filled out by provider. Patient requested to send it back via Fax within 7-days. Document is located in providers tray at front office.Please advise at Mobile [phone] (mobile)

## 2024-03-08 ENCOUNTER — Telehealth: Payer: Self-pay

## 2024-03-08 NOTE — Telephone Encounter (Signed)
 Pt CAP paperwork has been faxed to the fax number provided on the form. Pt notified and a copy placed on [employer] CMA desk

## 2024-03-11 ENCOUNTER — Other Ambulatory Visit (HOSPITAL_COMMUNITY): Payer: Self-pay | Admitting: Neurosurgery

## 2024-03-11 DIAGNOSIS — S0636AA Traumatic hemorrhage of cerebrum, unspecified, with loss of consciousness status unknown, initial encounter: Secondary | ICD-10-CM

## 2024-03-15 ENCOUNTER — Other Ambulatory Visit: Payer: Self-pay

## 2024-03-15 ENCOUNTER — Encounter: Payer: Self-pay | Admitting: Physical Medicine & Rehabilitation

## 2024-03-15 ENCOUNTER — Encounter: Attending: Physical Medicine & Rehabilitation | Admitting: Physical Medicine & Rehabilitation

## 2024-03-15 VITALS — BP 112/72 | HR 74 | Ht 65.0 in | Wt 149.0 lb

## 2024-03-15 DIAGNOSIS — G8194 Hemiplegia, unspecified affecting left nondominant side: Secondary | ICD-10-CM | POA: Diagnosis present

## 2024-03-15 DIAGNOSIS — I639 Cerebral infarction, unspecified: Secondary | ICD-10-CM

## 2024-03-15 MED ORDER — BETAMETHASONE SOD PHOS & ACET 6 (3-3) MG/ML IJ SUSP: 12.0000 mg | Freq: Once | INTRAMUSCULAR | Status: AC

## 2024-03-15 NOTE — Progress Notes (Signed)
 33 y.o. male with history of uncontrolled hypertension, thrombocytopenia, cirrhosis of the liver due to alcohol  abuse, alcohol  withdrawal seizures who was admitted on 12/16/2023 with acute onset of left-sided weakness with lethargy.  UDS showed alcohol  level at 264 and he was found to have acute large right cerebral hematoma with active extravasation/#.  He was treated with 2 units FFP as repeat CT head showed increasing signs of hemorrhage.  Dr. Gillie recommended serial CT head for monitoring.  He was started on hypertonic saline for cerebral edema and Cleviprex  added for BP control.  He had issues with agitation due to alcohol  withdrawal requiring phenobarb taper as well as Precedex .  Follow-up MRI brain showed's stable hematoma centered in right basal ganglia extending to anterior right temporal lobe with extension of edema and 5 mm leftward shift.    Dr. Jerri felt that bleed likely hypertensive in nature.  Abnormal LFTs were being monitored and thrombocytopenia was stable.  He had reports of right hip pain with x-rays done which were negative for fracture or contusion.  Left upper extremity ultrasound done due to pain and showed superficial thrombosis of left cephalic vein.  Mentation and activity tolerance were improving however he required plus to mod to max assist for standing with max cues for posture as well as max to total assist for ADLs.  He was independent working prior to admission.  CIR was recommended due to functional decline.      Admit date: 12/30/2023 Discharge date: 01/30/2024   Mod I with ADL except needs some help with bathing  Not smoking but using a product call Marina, I looked this up and it looks like this is a tobacco product with other additional ingredients that has been banned in certain parts of [place] due to oral cancer risk Discussed nicoderm is a alternative that is safer Patient asked about diet that may help with recovery.  We states that there is really no diet for  recovery from stroke but prevention would be DASH diet Patient would like to go back to work.  He worked with a Dealer.  He needs to use both upper extremities.  He still has limited use of left upper extremity.  He has just started outpatient PT OT He has some left shoulder pain mainly with elevating his arm In addition he has left lower extremity pain mostly in his ankle.  Has a history of ankle surgery.  He is not using his AFO.  We discussed that he should be using AFO when he ambulates especially longer distances outside the home. Pain Right Now 0 My pain is sharp  In the last 24 hours, has pain interfered with the following? General activity 5 Relation with others 0 Enjoyment of life 9 What TIME of day is your pain at its worst? night Sleep (in general) Poor  Pain is worse with: sitting Pain improves with: Reports nothing is helping with pain Relief from Meds: 0  Family History  Problem Relation Age of Onset   Hypertension Mother    Hypertension Father    Hypertension Maternal Grandfather    Esophageal cancer Neg Hx    Liver disease Neg Hx    Colon cancer Neg Hx    Social History   Socioeconomic History   Marital status: Married    Spouse name: Not on file   Number of children: 4   Years of education: Not on file   Highest education level: Not on file  Occupational History  Not on file  Tobacco Use   Smoking status: Former    Current packs/day: 0.00    Types: Cigarettes    Quit date: 05/13/2014    Years since quitting: 9.8   Smokeless tobacco: Former  Building services engineer status: Never Used  Substance and Sexual Activity   Alcohol  use: Yes    Comment: occasionally   Drug use: No   Sexual activity: Not Currently  Other Topics Concern   Not on file  Social History Narrative   ** Merged History Encounter **       Social Drivers of Health   Financial Resource Strain: Low Risk  (02/26/2024)   Overall Financial Resource Strain (CARDIA)    Difficulty  of Paying Living Expenses: Not hard at all  Food Insecurity: No Food Insecurity (02/26/2024)   Hunger Vital Sign    Worried About Running Out of Food in the Last Year: Never true    Ran Out of Food in the Last Year: Never true  Transportation Needs: No Transportation Needs (02/26/2024)   PRAPARE - Administrator, Civil Service (Medical): No    Lack of Transportation (Non-Medical): No  Physical Activity: Inactive (02/26/2024)   Exercise Vital Sign    Days of Exercise per Week: 0 days    Minutes of Exercise per Session: 0 min  Stress: No Stress Concern Present (02/26/2024)   [employer] of Occupational Health - Occupational Stress Questionnaire    Feeling of Stress: Only a little  Social Connections: Moderately Integrated (02/26/2024)   Social Connection and Isolation Panel    Frequency of Communication with Friends and Family: More than three times a week    Frequency of Social Gatherings with Friends and Family: Once a week    Attends Religious Services: More than 4 times per year    Active Member of [employer] or Organizations: No    Attends Banker Meetings: Never    Marital Status: Married   Past Surgical History:  Procedure Laterality Date   BREATH TEK H PYLORI N/A 09/08/2016   Procedure: BREATH TEK VEAR LORA;  Surgeon: Victory LITTIE Brand III, MD;  Location: [facility];  Service: Gastroenterology;  Laterality: N/A;   HARDWARE REMOVAL Left 07/27/2017   Procedure: Removal of deep implants right proximal and distal tibia;  Surgeon: Kit Rush, MD;  Location: [facility];  Service: Orthopedics;  Laterality: Left;   TENDON REPAIR Left 08/14/2014   Procedure: LEFT HAND WOUND EXPLORATION AND TENDON REPAIR;  Surgeon: Prentice LELON Pagan, MD;  Location: [facility];  Service: Orthopedics;  Laterality: Left;   TIBIA IM NAIL INSERTION  07/31/2012   Procedure: INTRAMEDULLARY (IM) NAIL TIBIAL;  Surgeon: Rush Kit, MD;  Location: [facility];  Service:  Orthopedics;  Laterality: Left;   UPPER GI ENDOSCOPY  07/25/2016   Past Surgical History:  Procedure Laterality Date   BREATH TEK H PYLORI N/A 09/08/2016   Procedure: BREATH TEK VEAR LORA;  Surgeon: Victory LITTIE Brand DOUGLAS, MD;  Location: [facility];  Service: Gastroenterology;  Laterality: N/A;   HARDWARE REMOVAL Left 07/27/2017   Procedure: Removal of deep implants right proximal and distal tibia;  Surgeon: Kit Rush, MD;  Location: [facility];  Service: Orthopedics;  Laterality: Left;   TENDON REPAIR Left 08/14/2014   Procedure: LEFT HAND WOUND EXPLORATION AND TENDON REPAIR;  Surgeon: Prentice LELON Pagan, MD;  Location: [facility];  Service: Orthopedics;  Laterality: Left;   TIBIA  IM NAIL INSERTION  07/31/2012   Procedure: INTRAMEDULLARY (IM) NAIL TIBIAL;  Surgeon: Norleen Armor, MD;  Location: [facility];  Service: Orthopedics;  Laterality: Left;   UPPER GI ENDOSCOPY  07/25/2016   Past Medical History:  Diagnosis Date   Hyperlipidemia    Hypertension    Liver disease    Painful orthopaedic hardware (HCC) 07/2017   right tibia   Seizures (HCC)    Stroke (cerebrum) (HCC)    Thrombocytopenia (HCC)    Verruca vulgaris 2023   penile mass   There were no vitals taken for this visit.  Opioid Risk Score:   Fall Risk Score:  `1  Depression screen [facility] 2/9     02/26/2024    1:34 PM 02/26/2024    1:33 PM 09/28/2017    4:24 PM 09/06/2017    3:56 PM  Depression screen PHQ 2/9  Decreased Interest 0 0 0 0  Down, Depressed, Hopeless 0 0 0 0  PHQ - 2 Score 0 0 0 0  Altered sleeping 3     Tired, decreased energy 3     Change in appetite 2     Feeling bad or failure about yourself  0     Trouble concentrating 0     Moving slowly or fidgety/restless 0     Suicidal thoughts 0     PHQ-9 Score 8     Difficult doing work/chores Somewhat difficult        Exam General No acute distress Mood and affect appropriate Motor strength is 3 - at the left deltoid bicep tricep grip  finger flexors and extensors Finger thumb opposition is slow on the left side difficulty reaching little finger Positive dysdiadochokinesis with rapid alternating supination pronation left upper extremity Positive dysmetria left finger-nose-finger Left lower extremity 4 - hip flexion knee extension 3 - ankle dorsiflexion plantarflexion Musculoskeletal there is no pain with left ankle range of motion there is soft tissue swelling both medially and laterally. There is no evidence of instability. No erythema.  No pain with weightbearing Left shoulder positive impingement sign at 90 degrees Positive subluxation with positive deltoid atrophy  Tone increased in the left foot inverters especially noted during weightbearing.  He also has some great toe flexion noted during weightbearing.  Right hemorrhagic CVA with left spastic hemiplegia Left upper extremity spasticity is mild however left lower extremity spasticity is moderate in the foot inverters as well as great toe flexor. Would recommend botulinum toxin injection to the tibialis posterior Dysport 200 units to the posterior tibialis and 100 units to the left flexor hallucis longus  2.  Left shoulder impingement syndrome may also be developing some adhesive capsulitis Outpatient PT OT Shoulder injection today  Shoulder injection left   Indication: Left shoulder pain not relieved by medication management and other conservative care.  Informed consent was obtained after describing risks and benefits of the procedure with the patient, this includes bleeding, bruising, infection and medication side effects. The patient wishes to proceed and has given written consent. Patient was placed in a seated position. The left shoulder was marked and prepped with betadine in the subacromial area. A 25-gauge 1-1/2 inch needle was inserted into the subacromial area. After negative draw back for blood, a solution containing 1 mL of 6 mg per ML betamethasone  and  4 mL of 1% lidocaine  was injected. A band aid was applied. The patient tolerated the procedure well. Post procedure instructions were given.   3.  Return to work at  this point I do not think he is able to do so.  I have written him a letter.  He needs to complete his PT OT.  I will also need a job description.  I will see him back next month to follow-up

## 2024-03-15 NOTE — Patient Instructions (Signed)

## 2024-03-19 ENCOUNTER — Ambulatory Visit (HOSPITAL_COMMUNITY)
Admission: RE | Admit: 2024-03-19 | Discharge: 2024-03-19 | Disposition: A | Discharge status: Home / Self Care | Source: Ambulatory Visit | Attending: Neurosurgery | Admitting: Neurosurgery

## 2024-03-19 DIAGNOSIS — S0636AA Traumatic hemorrhage of cerebrum, unspecified, with loss of consciousness status unknown, initial encounter: Secondary | ICD-10-CM | POA: Insufficient documentation

## 2024-03-20 ENCOUNTER — Encounter: Payer: Self-pay | Admitting: Physical Medicine & Rehabilitation

## 2024-03-21 ENCOUNTER — Encounter: Payer: Self-pay | Admitting: Physical Therapy

## 2024-03-21 ENCOUNTER — Ambulatory Visit: Attending: Physician Assistant | Admitting: Physical Therapy

## 2024-03-21 ENCOUNTER — Ambulatory Visit: Admitting: Occupational Therapy

## 2024-03-21 VITALS — BP 110/65 | HR 70

## 2024-03-21 DIAGNOSIS — M6281 Muscle weakness (generalized): Secondary | ICD-10-CM | POA: Insufficient documentation

## 2024-03-21 DIAGNOSIS — I629 Nontraumatic intracranial hemorrhage, unspecified: Secondary | ICD-10-CM | POA: Insufficient documentation

## 2024-03-21 DIAGNOSIS — R41842 Visuospatial deficit: Secondary | ICD-10-CM | POA: Insufficient documentation

## 2024-03-21 DIAGNOSIS — R2689 Other abnormalities of gait and mobility: Secondary | ICD-10-CM | POA: Diagnosis present

## 2024-03-21 DIAGNOSIS — R29898 Other symptoms and signs involving the musculoskeletal system: Secondary | ICD-10-CM | POA: Diagnosis present

## 2024-03-21 DIAGNOSIS — S43002D Unspecified subluxation of left shoulder joint, subsequent encounter: Secondary | ICD-10-CM

## 2024-03-21 DIAGNOSIS — R29818 Other symptoms and signs involving the nervous system: Secondary | ICD-10-CM | POA: Diagnosis present

## 2024-03-21 DIAGNOSIS — R278 Other lack of coordination: Secondary | ICD-10-CM

## 2024-03-21 DIAGNOSIS — R2681 Unsteadiness on feet: Secondary | ICD-10-CM | POA: Insufficient documentation
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
## 2024-03-21 NOTE — Patient Instructions (Signed)
 Access Code: [email or web address] URL: [email or web address] Date: 03/21/2024 Prepared by: Clarita Pride  Exercises - Putty Squeezes  - 1-2 x daily - 10 reps - Rolling Putty on Table  - 1-2 x daily - 10 reps - Finger Pinch and Pull with Putty  - 1-2 x daily - 10 reps
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
## 2024-03-25 ENCOUNTER — Encounter: Payer: Self-pay | Admitting: Family Medicine

## 2024-03-25 ENCOUNTER — Ambulatory Visit: Admitting: Family Medicine

## 2024-03-25 ENCOUNTER — Ambulatory Visit: Payer: Self-pay | Admitting: Family Medicine

## 2024-03-25 VITALS — BP 110/74 | HR 77 | Temp 98.4°F | Ht 65.0 in | Wt 150.0 lb

## 2024-03-25 DIAGNOSIS — D696 Thrombocytopenia, unspecified: Secondary | ICD-10-CM | POA: Diagnosis not present

## 2024-03-25 DIAGNOSIS — F1010 Alcohol abuse, uncomplicated: Secondary | ICD-10-CM | POA: Diagnosis not present

## 2024-03-25 DIAGNOSIS — R600 Localized edema: Secondary | ICD-10-CM

## 2024-03-25 LAB — CBC WITH DIFFERENTIAL/PLATELET
Basophils Absolute: 0 K/uL (ref 0.0–0.1)
Basophils Relative: 0.3 % (ref 0.0–3.0)
Eosinophils Absolute: 0.2 K/uL (ref 0.0–0.7)
Eosinophils Relative: 3.1 % (ref 0.0–5.0)
HCT: 36.6 % — ABNORMAL LOW (ref 39.0–52.0)
Hemoglobin: 12.1 g/dL — ABNORMAL LOW (ref 13.0–17.0)
Lymphocytes Relative: 19.7 % (ref 12.0–46.0)
Lymphs Abs: 1.3 K/uL (ref 0.7–4.0)
MCHC: 32.9 g/dL (ref 30.0–36.0)
MCV: 93.4 fl (ref 78.0–100.0)
Monocytes Absolute: 0.6 K/uL (ref 0.1–1.0)
Monocytes Relative: 8.8 % (ref 3.0–12.0)
Neutro Abs: 4.5 K/uL (ref 1.4–7.7)
Neutrophils Relative %: 68.1 % (ref 43.0–77.0)
Platelets: 129 K/uL — ABNORMAL LOW (ref 150.0–400.0)
RBC: 3.92 Mil/uL — ABNORMAL LOW (ref 4.22–5.81)
RDW: 14.7 % (ref 11.5–15.5)
WBC: 6.6 K/uL (ref 4.0–10.5)

## 2024-03-25 MED ORDER — THIAMINE HCL 100 MG PO TABS: 100.0000 mg | ORAL_TABLET | Freq: Every day | ORAL | 0 refills | Status: AC

## 2024-03-25 MED ORDER — FOLIC ACID 1 MG PO TABS: 1.0000 mg | ORAL_TABLET | Freq: Every day | ORAL | 0 refills | Status: AC

## 2024-03-25 NOTE — Progress Notes (Signed)
 Established Patient Office Visit   Subjective:  Patient ID: Troy Bishop, male    DOB: February 27, 1991  Age: 33 y.o. MRN: [id number]  Chief Complaint  Patient presents with   Medical Management of Chronic Issues    1 month follow up     HPI Patient is complaining of left lower extremity edema. He reports this became worse since being discharged on 01/30/2024 for intracerebral hemorrhage, anemia, thrombocytopenia, anxiety, HTN, and left hemiplegia. He reports the swelling is constant. He reports he did have some in the hospital. Denies pain in the leg, but reports sometimes he has pain in hip. He is participating in physical/occupation therapy twice a week. He denies any redness in calf.   Requesting a refill on thiamine  and folic acid . He reports he is drinking Bud Light, 2 drinks a day.   ROS See HPI above     Objective:   BP 110/74   Pulse 77   Temp 98.4 F (36.9 C) (Oral)   Ht 5' 5 (1.651 m)   Wt 150 lb (68 kg)   SpO2 98%   BMI 24.96 kg/m    Physical Exam Vitals reviewed.  Constitutional:      General: He is not in acute distress.    Appearance: Normal appearance. He is not ill-appearing, toxic-appearing or diaphoretic.  HENT:     Head: Normocephalic and atraumatic.  Eyes:     General:        Right eye: No discharge.        Left eye: No discharge.     Conjunctiva/sclera: Conjunctivae normal.  Cardiovascular:     Rate and Rhythm: Normal rate and regular rhythm.     Pulses:          Dorsalis pedis pulses are 2+ on the right side and 2+ on the left side.     Heart sounds: Normal heart sounds. No murmur heard.    No friction rub. No gallop.  Pulmonary:     Effort: Pulmonary effort is normal. No respiratory distress.     Breath sounds: Normal breath sounds.  Musculoskeletal:        General: Normal range of motion.     Left lower leg: Edema (Trace to +1, starting right above ankle and into foot.) present.  Skin:    General: Skin is warm and dry.  Neurological:      Mental Status: He is alert and oriented to person, place, and time. Mental status is at baseline.     Motor: Weakness present.     Gait: Gait abnormal.  Psychiatric:        Mood and Affect: Mood normal.        Behavior: Behavior normal.        Thought Content: Thought content normal.        Judgment: Judgment normal.      Assessment & Plan:  Lower extremity edema  Alcohol  abuse -     Folic Acid ; Take 1 tablet (1 mg total) by mouth daily.  Dispense: 90 tablet; Refill: 0 -     Thiamine  HCl; Take 1 tablet (100 mg total) by mouth daily.  Dispense: 90 tablet; Refill: 0  Thrombocytopenia (HCC) -     CBC with Differential/Platelet  -Lower extremity edema: More likely from residual of stroke. Recommend to elevate left leg to heart level when resting. Also, may obtain knee high compression stockings to wear during the day and can take them off at night time.  -Refilled Folic  Acid and Thiamine . Strongly encourage to stop drinking alcohol .  -Ordered CBC to reassess thrombocytopenia. Patient has an upcoming appointment with hematology on 09/04.   Return in about 3 months (around 06/25/2024) for chronic management.   Shabnam Ladd, NP

## 2024-03-25 NOTE — Patient Instructions (Addendum)
-  It was good to see you today.  -Lower extremity edema: More likely from residual of stroke. Recommend to elevate left leg to heart level when resting. Also, may obtain knee high compression stockings to wear during the day and can take them off at night time.  -Refilled Folic Acid  and Thiamine . Strongly encourage to stop drinking alcohol .  -Ordered labs. Office will call with results. -Please go to all your upcoming specialist appointments.  -Follow up in 3 months for chronic management.

## 2024-03-26 ENCOUNTER — Ambulatory Visit: Admitting: Occupational Therapy

## 2024-03-26 ENCOUNTER — Ambulatory Visit

## 2024-03-26 DIAGNOSIS — M6281 Muscle weakness (generalized): Secondary | ICD-10-CM | POA: Diagnosis not present

## 2024-03-26 DIAGNOSIS — R29898 Other symptoms and signs involving the musculoskeletal system: Secondary | ICD-10-CM

## 2024-03-26 DIAGNOSIS — R41842 Visuospatial deficit: Secondary | ICD-10-CM

## 2024-03-26 DIAGNOSIS — R2689 Other abnormalities of gait and mobility: Secondary | ICD-10-CM

## 2024-03-26 DIAGNOSIS — R29818 Other symptoms and signs involving the nervous system: Secondary | ICD-10-CM

## 2024-03-26 DIAGNOSIS — R278 Other lack of coordination: Secondary | ICD-10-CM

## 2024-03-26 DIAGNOSIS — I629 Nontraumatic intracranial hemorrhage, unspecified: Secondary | ICD-10-CM

## 2024-03-26 DIAGNOSIS — S43002D Unspecified subluxation of left shoulder joint, subsequent encounter: Secondary | ICD-10-CM
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
## 2024-03-28 ENCOUNTER — Ambulatory Visit: Admitting: Occupational Therapy

## 2024-03-28 ENCOUNTER — Encounter: Payer: Self-pay | Admitting: Physical Therapy

## 2024-03-28 ENCOUNTER — Ambulatory Visit: Admitting: Physical Therapy

## 2024-03-28 VITALS — BP 120/71 | HR 80

## 2024-03-28 DIAGNOSIS — R41842 Visuospatial deficit: Secondary | ICD-10-CM

## 2024-03-28 DIAGNOSIS — M6281 Muscle weakness (generalized): Secondary | ICD-10-CM

## 2024-03-28 DIAGNOSIS — R29818 Other symptoms and signs involving the nervous system: Secondary | ICD-10-CM

## 2024-03-28 DIAGNOSIS — R278 Other lack of coordination: Secondary | ICD-10-CM

## 2024-03-28 DIAGNOSIS — S43002D Unspecified subluxation of left shoulder joint, subsequent encounter: Secondary | ICD-10-CM

## 2024-03-28 DIAGNOSIS — R2681 Unsteadiness on feet: Secondary | ICD-10-CM

## 2024-03-28 DIAGNOSIS — I629 Nontraumatic intracranial hemorrhage, unspecified: Secondary | ICD-10-CM

## 2024-03-28 DIAGNOSIS — R29898 Other symptoms and signs involving the musculoskeletal system: Secondary | ICD-10-CM
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
## 2024-03-29 ENCOUNTER — Other Ambulatory Visit: Payer: Self-pay | Admitting: Family Medicine

## 2024-03-29 MED ORDER — GABAPENTIN 100 MG PO CAPS: 200.0000 mg | ORAL_CAPSULE | Freq: Two times a day (BID) | ORAL | 0 refills | Status: DC

## 2024-03-29 MED ORDER — CARVEDILOL 25 MG PO TABS: 25.0000 mg | ORAL_TABLET | Freq: Two times a day (BID) | ORAL | 0 refills | Status: DC

## 2024-03-29 NOTE — Telephone Encounter (Signed)
 Copied from CRM #[id number]. Topic: Clinical - Medication Refill >> Mar 29, 2024 10:34 AM Burnard DEL wrote: Medication:  gabapentin  (NEURONTIN ) 100 MG capsule  carvedilol  (COREG ) 25 MG tablet  Has the patient contacted their pharmacy? No (Agent: If no, request that the patient contact the pharmacy for the refill. If patient does not wish to contact the pharmacy document the reason why and proceed with request.) (Agent: If yes, when and what did the pharmacy advise?)  This is the patient's preferred pharmacy:  [id number] [place], [place] - [address] AT [facility] [address] [facility] [address] [place] [address] Phone: [phone] Fax: [phone]    Is this the correct pharmacy for this prescription? Yes If no, delete pharmacy and type the correct one.   Has the prescription been filled recently? No  Is the patient out of the medication? Yes  Has the patient been seen for an appointment in the last year OR does the patient have an upcoming appointment? Yes  Can we respond through MyChart? Yes  Agent: Please be advised that Rx refills may take up to 3 business days. We ask that you follow-up with your pharmacy.

## 2024-04-01 ENCOUNTER — Ambulatory Visit: Admitting: Occupational Therapy

## 2024-04-01 ENCOUNTER — Ambulatory Visit: Admitting: Physical Therapy

## 2024-04-01 ENCOUNTER — Encounter: Payer: Self-pay | Admitting: Physical Therapy

## 2024-04-01 VITALS — BP 124/83 | HR 97

## 2024-04-01 DIAGNOSIS — R278 Other lack of coordination: Secondary | ICD-10-CM

## 2024-04-01 DIAGNOSIS — R29898 Other symptoms and signs involving the musculoskeletal system: Secondary | ICD-10-CM

## 2024-04-01 DIAGNOSIS — R29818 Other symptoms and signs involving the nervous system: Secondary | ICD-10-CM

## 2024-04-01 DIAGNOSIS — M6281 Muscle weakness (generalized): Secondary | ICD-10-CM | POA: Diagnosis not present

## 2024-04-01 DIAGNOSIS — S43002D Unspecified subluxation of left shoulder joint, subsequent encounter: Secondary | ICD-10-CM

## 2024-04-01 DIAGNOSIS — R2681 Unsteadiness on feet: Secondary | ICD-10-CM
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
## 2024-04-01 NOTE — Patient Instructions (Signed)
 Access Code: [email or web address] URL: [email or web address] Date: 04/01/2024 Prepared by: Clarita Pride  NEW Exercises  - Seated Shoulder Flexion AAROM with Dowel  - 1-3 x daily - 5-10 reps - Standing Shoulder Abduction AAROM with Dowel  - 1-3 x daily - 5-10 reps - Shoulder extension with resistance - Neutral  - 1-3 x daily - 5-10 reps - Seated Elbow Flexion with Resistance  - 1-3 x daily - 10 reps
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
## 2024-04-03 ENCOUNTER — Ambulatory Visit: Admitting: Physical Therapy

## 2024-04-03 ENCOUNTER — Ambulatory Visit: Admitting: Occupational Therapy

## 2024-04-03 ENCOUNTER — Encounter: Admitting: Occupational Therapy

## 2024-04-03 DIAGNOSIS — M6281 Muscle weakness (generalized): Secondary | ICD-10-CM | POA: Diagnosis not present

## 2024-04-03 DIAGNOSIS — R278 Other lack of coordination: Secondary | ICD-10-CM

## 2024-04-03 DIAGNOSIS — R29818 Other symptoms and signs involving the nervous system: Secondary | ICD-10-CM

## 2024-04-03 DIAGNOSIS — S43002D Unspecified subluxation of left shoulder joint, subsequent encounter: Secondary | ICD-10-CM

## 2024-04-03 NOTE — Patient Instructions (Signed)
 Access Code: [email or web address] URL: [email or web address] Date: 04/03/2024 Prepared by: Clarita Pride  NEW Exercises  - Seated Shoulder Flexion Towel Slide at Table Top  - 1-3 x daily - 10 reps - Wrist Extension AROM  - 1-3 x daily - 10 reps - Wrist Flexion with Dumbbell  - 1-3 x daily - 10 reps - Seated Elbow Flexion and Extension AROM  - 1-3 x daily - 10 reps
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
## 2024-04-04 ENCOUNTER — Telehealth: Payer: Self-pay | Admitting: *Deleted

## 2024-04-04 NOTE — Telephone Encounter (Signed)
 Copied from CRM [id number]. Topic: General - Other >> Apr 04, 2024 11:15 AM Troy Bishop wrote: Reason for CRM: Patient called in regarding the disable adult waiver disclosure, patient wife wanted to know if she is able to pick up a copy

## 2024-04-04 NOTE — Telephone Encounter (Signed)
 Notified pt and pt will return to office to pick up paperwork.

## 2024-04-09 ENCOUNTER — Ambulatory Visit: Attending: Physician Assistant | Admitting: Occupational Therapy

## 2024-04-09 DIAGNOSIS — R278 Other lack of coordination: Secondary | ICD-10-CM | POA: Diagnosis present

## 2024-04-09 DIAGNOSIS — R29898 Other symptoms and signs involving the musculoskeletal system: Secondary | ICD-10-CM | POA: Insufficient documentation

## 2024-04-09 DIAGNOSIS — I629 Nontraumatic intracranial hemorrhage, unspecified: Secondary | ICD-10-CM | POA: Diagnosis present

## 2024-04-09 DIAGNOSIS — M6281 Muscle weakness (generalized): Secondary | ICD-10-CM | POA: Insufficient documentation

## 2024-04-09 DIAGNOSIS — R41842 Visuospatial deficit: Secondary | ICD-10-CM | POA: Diagnosis present

## 2024-04-09 DIAGNOSIS — R2681 Unsteadiness on feet: Secondary | ICD-10-CM | POA: Insufficient documentation

## 2024-04-09 DIAGNOSIS — R2689 Other abnormalities of gait and mobility: Secondary | ICD-10-CM | POA: Diagnosis present

## 2024-04-09 DIAGNOSIS — S43002D Unspecified subluxation of left shoulder joint, subsequent encounter: Secondary | ICD-10-CM | POA: Diagnosis present

## 2024-04-09 DIAGNOSIS — R471 Dysarthria and anarthria: Secondary | ICD-10-CM | POA: Insufficient documentation

## 2024-04-09 DIAGNOSIS — R29818 Other symptoms and signs involving the nervous system: Secondary | ICD-10-CM | POA: Insufficient documentation

## 2024-04-09 NOTE — Patient Instructions (Addendum)
 Access Code: [email or web address] URL: [email or web address] Date: 04/09/2024 Prepared by: Clarita Pride  New Exercises - Seated UE Activity: Weight-Bearing Side-To-Side Shifts  - 1 x daily - 10 reps - Standing UE Activity: Weight-Bearing With Extended Elbows, Open Hand  - 1 x daily - 10 reps - Quadruped Weight-Bearing Activity: Reaching  - 1 x daily - 10 reps  WEBSITE: [email or web address]
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
## 2024-04-11 ENCOUNTER — Ambulatory Visit: Admitting: Occupational Therapy

## 2024-04-11 ENCOUNTER — Inpatient Hospital Stay

## 2024-04-11 ENCOUNTER — Encounter: Payer: Self-pay | Admitting: Neurology

## 2024-04-11 ENCOUNTER — Inpatient Hospital Stay: Attending: Hematology and Oncology | Admitting: Hematology and Oncology

## 2024-04-11 ENCOUNTER — Ambulatory Visit: Admitting: Neurology

## 2024-04-11 VITALS — BP 112/72 | HR 80 | Ht 65.0 in | Wt 151.0 lb

## 2024-04-11 VITALS — BP 123/80 | HR 83 | Temp 98.7°F | Resp 15 | Wt 151.7 lb

## 2024-04-11 DIAGNOSIS — D696 Thrombocytopenia, unspecified: Secondary | ICD-10-CM | POA: Insufficient documentation

## 2024-04-11 DIAGNOSIS — R278 Other lack of coordination: Secondary | ICD-10-CM

## 2024-04-11 DIAGNOSIS — Z79899 Other long term (current) drug therapy: Secondary | ICD-10-CM | POA: Insufficient documentation

## 2024-04-11 DIAGNOSIS — Z87891 Personal history of nicotine dependence: Secondary | ICD-10-CM | POA: Insufficient documentation

## 2024-04-11 DIAGNOSIS — F32A Depression, unspecified: Secondary | ICD-10-CM | POA: Diagnosis not present

## 2024-04-11 DIAGNOSIS — I629 Nontraumatic intracranial hemorrhage, unspecified: Secondary | ICD-10-CM

## 2024-04-11 DIAGNOSIS — I10 Essential (primary) hypertension: Secondary | ICD-10-CM | POA: Insufficient documentation

## 2024-04-11 DIAGNOSIS — E785 Hyperlipidemia, unspecified: Secondary | ICD-10-CM | POA: Insufficient documentation

## 2024-04-11 DIAGNOSIS — I69354 Hemiplegia and hemiparesis following cerebral infarction affecting left non-dominant side: Secondary | ICD-10-CM

## 2024-04-11 DIAGNOSIS — D6959 Other secondary thrombocytopenia: Secondary | ICD-10-CM | POA: Diagnosis not present

## 2024-04-11 DIAGNOSIS — Z8673 Personal history of transient ischemic attack (TIA), and cerebral infarction without residual deficits: Secondary | ICD-10-CM | POA: Insufficient documentation

## 2024-04-11 DIAGNOSIS — I610 Nontraumatic intracerebral hemorrhage in hemisphere, subcortical: Secondary | ICD-10-CM | POA: Diagnosis not present

## 2024-04-11 DIAGNOSIS — S43002D Unspecified subluxation of left shoulder joint, subsequent encounter: Secondary | ICD-10-CM

## 2024-04-11 DIAGNOSIS — K7460 Unspecified cirrhosis of liver: Secondary | ICD-10-CM | POA: Diagnosis not present

## 2024-04-11 DIAGNOSIS — R29818 Other symptoms and signs involving the nervous system: Secondary | ICD-10-CM

## 2024-04-11 DIAGNOSIS — M6281 Muscle weakness (generalized): Secondary | ICD-10-CM

## 2024-04-11 LAB — WBC/PLT IN CITRATE

## 2024-04-11 LAB — CBC WITH DIFFERENTIAL (CANCER CENTER ONLY)
Abs Immature Granulocytes: 0.01 K/uL (ref 0.00–0.07)
Basophils Absolute: 0 K/uL (ref 0.0–0.1)
Basophils Relative: 1 %
Eosinophils Absolute: 0.2 K/uL (ref 0.0–0.5)
Eosinophils Relative: 5 %
HCT: 40 % (ref 39.0–52.0)
Hemoglobin: 13.3 g/dL (ref 13.0–17.0)
Immature Granulocytes: 0 %
Lymphocytes Relative: 20 %
Lymphs Abs: 0.9 K/uL (ref 0.7–4.0)
MCH: 30.8 pg (ref 26.0–34.0)
MCHC: 33.3 g/dL (ref 30.0–36.0)
MCV: 92.6 fL (ref 80.0–100.0)
Monocytes Absolute: 0.6 K/uL (ref 0.1–1.0)
Monocytes Relative: 12 %
Neutro Abs: 2.9 K/uL (ref 1.7–7.7)
Neutrophils Relative %: 62 %
Platelet Count: 108 K/uL — ABNORMAL LOW (ref 150–400)
RBC: 4.32 MIL/uL (ref 4.22–5.81)
RDW: 13.5 % (ref 11.5–15.5)
WBC Count: 4.7 K/uL (ref 4.0–10.5)
nRBC: 0 % (ref 0.0–0.2)

## 2024-04-11 LAB — CMP (CANCER CENTER ONLY)
ALT: 24 U/L (ref 0–44)
AST: 22 U/L (ref 15–41)
Albumin: 5.1 g/dL — ABNORMAL HIGH (ref 3.5–5.0)
Alkaline Phosphatase: 73 U/L (ref 38–126)
Anion gap: 8 (ref 5–15)
BUN: 13 mg/dL (ref 6–20)
CO2: 29 mmol/L (ref 22–32)
Calcium: 10.3 mg/dL (ref 8.9–10.3)
Chloride: 104 mmol/L (ref 98–111)
Creatinine: 0.73 mg/dL (ref 0.61–1.24)
GFR, Estimated: >60 mL/min (ref >60)
Glucose, Bld: 126 mg/dL — ABNORMAL HIGH (ref 70–99)
Potassium: 3.4 mmol/L — ABNORMAL LOW (ref 3.5–5.1)
Sodium: 141 mmol/L (ref 135–145)
Total Bilirubin: 0.7 mg/dL (ref 0.0–1.2)
Total Protein: 9.4 g/dL — ABNORMAL HIGH (ref 6.5–8.1)

## 2024-04-11 LAB — VITAMIN B12: Vitamin B-12: 376 pg/mL (ref 180–914)

## 2024-04-11 LAB — FOLATE: Folate: >20 ng/mL (ref >5.9)

## 2024-04-11 LAB — IMMATURE PLATELET FRACTION: Immature Platelet Fraction: 28.3 % — ABNORMAL HIGH (ref 1.2–8.6)

## 2024-04-11 MED ORDER — DIVALPROEX SODIUM ER 500 MG PO TB24: 500.0000 mg | ORAL_TABLET | Freq: Every evening | ORAL | 11 refills | Status: AC

## 2024-04-11 NOTE — Progress Notes (Signed)
 Chief Complaint  Patient presents with   New Patient (Initial Visit)    RM 15      ASSESSMENT AND PLAN  Troy Bishop is a 33 y.o. male   With large right basal ganglia bleeding extending into right temporal lobe,  In the setting of hypertension, alcohol  abuse, cirrhosis, low platelet  Improving with physical therapy, Had 1 seizure even before intracranial bleeding, now with right temporal lobe involvement, high risk for recurrent seizure, also complains of depression, difficulty sleeping,  Add on Depakote  ER 50 mg every night  EEG  Return in 3-4 4 months, need to monitor liver function and platelet count carefully  Emphasized importance of alcohol  cessation  DIAGNOSTIC DATA (LABS, IMAGING, TESTING) - I reviewed patient records, labs, notes, testing and imaging myself where available.   MEDICAL HISTORY:  Troy Bishop, is a 33 year old male, accompanied by his wife seen in request by his primary care Billy Craze to follow-up on intracranial hemorrhage, initial office visit April 11, 2024  History is obtained from the patient and review of electronic medical records. I personally reviewed pertinent available imaging films in PACS.   PMHx of  HTN HLD  Long history of alcohol  abuse, hypertension, 1 single seizure before, presenting with sudden onset left-sided weakness Dec 16, 2023, was found to have large right subcortical intracranial hemorrhage, following CT showed enlargement of intracranial hemorrhage, platelet count was 54, received platelet transfusion,   abnormal liver enzyme, alcoholic cirrhosis liver  He was treated with hypertonic saline to decrease edema, use phenobarbital  along with precedex  drip to treat alcohol  withdrawal symptoms  CT angiogram Dec 16, 2023, evidence of 5.5 x 3.6 x 3.5 cm acute intraparenchymal hemorrhage involving right frontotemporal region, localized edema  CT head and neck showed no large vessel disease,  Echocardiogram ejection  fraction 60 to 65% LDL 124, A1c 5.6  MRI of the brain with without contrast Dec 19, 2023, massive intracranial hemorrhage at the right basal ganglia extending into the anterior right temporal lobe,  Most recent repeat CT head without contrast March 19, 2024, prominent encephalomalacia at the site of previous hemorrhage, extending to the tip of right temporal lobe, centered in the right basal ganglia  He is not discharged home, ambulate with left ankle brace, antigravity movement of left upper extremity, he used to work on sewing machine, 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, continue to make mild progress,  He went back to drink at least 2 beers every night to help him sleep, complains of depression, wife reported he gets agitated easily   Most recent repeat lab evaluation was on February 26, 2024, normal CMP, with normal liver function test, only mild elevated glucose 135, CBC showed improved platelet 129, mild decreased hemoglobin of 12.1   PHYSICAL EXAM:   Vitals:   04/11/24 0831  BP: 112/72  Pulse: 80  SpO2: 98%  Weight: 151 lb (68.5 kg)  Height: 5' 5 (1.651 m)   Not recorded     Body mass index is 25.13 kg/m.  PHYSICAL EXAMNIATION:  Gen: NAD, conversant, well nourised, well groomed                     Cardiovascular: Regular rate rhythm, no peripheral edema, warm, nontender. Eyes: Conjunctivae clear without exudates or hemorrhage Neck: Supple, no carotid bruits. Pulmonary: Clear to auscultation bilaterally   NEUROLOGICAL EXAM:  MENTAL STATUS: Speech/cognition: Awake, alert, oriented to history taking and casual conversation CRANIAL NERVES: CN II: Visual fields are  full to confrontation. Pupils are round equal and briskly reactive to light. CN III, IV, VI: extraocular movement are normal. No ptosis. CN V: Facial sensation is intact to light touch CN VII: Face is symmetric with normal eye closure  CN VIII: Hearing is normal to causal conversation. CN IX, X: Phonation is  normal. CN XI: Head turning and shoulder shrug are intact  MOTOR: Left upper extremity mild spasticity, antigravity movement, wear left ankle brace, mild left hip flexion weakness  REFLEXES: Hyperreflexia  of left upper and lower extremity  SENSORY: Intact to light touch, pinprick and vibratory sensation are intact in fingers and toes.  COORDINATION: There is no trunk or limb dysmetria noted.  GAIT/STANCE: Need push-up to get up from seated position, dragging left leg  REVIEW OF SYSTEMS:  Full 14 system review of systems performed and notable only for as above All other review of systems were negative.   ALLERGIES: No Known Allergies  HOME MEDICATIONS: Current Outpatient Medications  Medication Sig Dispense Refill   amLODipine  (NORVASC ) 10 MG tablet TAKE 1 TABLET(10 MG) BY MOUTH DAILY 90 tablet 1   artificial tears ophthalmic solution Place 1 drop into both eyes as needed for dry eyes. 15 mL 0   atorvastatin  (LIPITOR) 40 MG tablet TAKE 1 TABLET(40 MG) BY MOUTH DAILY 90 tablet 1   Baclofen  5 MG TABS Take 1 tablet (5 mg total) by mouth 2 (two) times daily. 60 tablet 0   carvedilol  (COREG ) 25 MG tablet TAKE 1 TABLET(25 MG) BY MOUTH TWICE DAILY WITH A MEAL 180 tablet 0   folic acid  (FOLVITE ) 1 MG tablet Take 1 tablet (1 mg total) by mouth daily. 90 tablet 0   gabapentin  (NEURONTIN ) 100 MG capsule Take 2 capsules (200 mg total) by mouth 2 (two) times daily. 120 capsule 0   hydrOXYzine  (VISTARIL ) 25 MG capsule Take 1 capsule (25 mg total) by mouth every 8 (eight) hours as needed for itching. 30 capsule 0   losartan  (COZAAR ) 100 MG tablet TAKE 1 TABLET(100 MG) BY MOUTH DAILY 90 tablet 1   Multiple Vitamin (MULTIVITAMIN WITH MINERALS) TABS tablet Take 1 tablet by mouth daily. 30 tablet 0   naphazoline-glycerin  (CLEAR EYES REDNESS) 0.012-0.25 % SOLN Place 2 drops into the left eye 3 (three) times daily. 30 mL 0   pantoprazole  (PROTONIX ) 40 MG tablet TAKE 1 TABLET(40 MG) BY MOUTH DAILY 90  tablet 1   risankizumab-rzaa (SKYRIZI PEN) 150 MG/ML pen Inject 150 mg as directed every 3 (three) months.     thiamine  (VITAMIN B1) 100 MG tablet Take 1 tablet (100 mg total) by mouth daily. 90 tablet 0   Current Facility-Administered Medications  Medication Dose Route Frequency Provider Last Rate Last Admin   betamethasone  acetate-betamethasone  sodium phosphate  (CELESTONE ) injection 12 mg  12 mg Intramuscular Once         PAST MEDICAL HISTORY: Past Medical History:  Diagnosis Date   Hyperlipidemia    Hypertension    Liver disease    Painful orthopaedic hardware (HCC) 07/2017   right tibia   Seizures (HCC)    Stroke (cerebrum) (HCC)    Thrombocytopenia (HCC)    Verruca vulgaris 2023   penile mass    PAST SURGICAL HISTORY: Past Surgical History:  Procedure Laterality Date   BREATH TEK H PYLORI N/A 09/08/2016   Procedure: BREATH TEK H PYLORI;  Surgeon: Victory LITTIE Legrand DOUGLAS, MD;  Location: [facility];  Service: Gastroenterology;  Laterality: N/A;   HARDWARE REMOVAL Left 07/27/2017  Procedure: Removal of deep implants right proximal and distal tibia;  Surgeon: Kit Rush, MD;  Location: [facility];  Service: Orthopedics;  Laterality: Left;   TENDON REPAIR Left 08/14/2014   Procedure: LEFT HAND WOUND EXPLORATION AND TENDON REPAIR;  Surgeon: Prentice LELON Pagan, MD;  Location: [facility];  Service: Orthopedics;  Laterality: Left;   TIBIA IM NAIL INSERTION  07/31/2012   Procedure: INTRAMEDULLARY (IM) NAIL TIBIAL;  Surgeon: Rush Kit, MD;  Location: [facility];  Service: Orthopedics;  Laterality: Left;   UPPER GI ENDOSCOPY  07/25/2016    FAMILY HISTORY: Family History  Problem Relation Age of Onset   Hypertension Mother    Hypertension Father    Hypertension Maternal Grandfather    Esophageal cancer Neg Hx    Liver disease Neg Hx    Colon cancer Neg Hx     SOCIAL HISTORY: Social History   Socioeconomic History   Marital status: Married    Spouse  name: Not on file   Number of children: 4   Years of education: Not on file   Highest education level: Not on file  Occupational History   Not on file  Tobacco Use   Smoking status: Former    Current packs/day: 0.00    Types: Cigarettes    Quit date: 05/13/2014    Years since quitting: 9.9   Smokeless tobacco: Former  Building services engineer status: Never Used  Substance and Sexual Activity   Alcohol  use: Yes    Comment: occasionally   Drug use: No   Sexual activity: Not Currently  Other Topics Concern   Not on file  Social History Narrative   ** Merged History Encounter **       Social Drivers of Health   Financial Resource Strain: Low Risk  (02/26/2024)   Overall Financial Resource Strain (CARDIA)    Difficulty of Paying Living Expenses: Not hard at all  Food Insecurity: No Food Insecurity (02/26/2024)   Hunger Vital Sign    Worried About Running Out of Food in the Last Year: Never true    Ran Out of Food in the Last Year: Never true  Transportation Needs: No Transportation Needs (02/26/2024)   PRAPARE - Administrator, Civil Service (Medical): No    Lack of Transportation (Non-Medical): No  Physical Activity: Inactive (02/26/2024)   Exercise Vital Sign    Days of Exercise per Week: 0 days    Minutes of Exercise per Session: 0 min  Stress: No Stress Concern Present (02/26/2024)   [employer] of Occupational Health - Occupational Stress Questionnaire    Feeling of Stress: Only a little  Social Connections: Moderately Integrated (02/26/2024)   Social Connection and Isolation Panel    Frequency of Communication with Friends and Family: More than three times a week    Frequency of Social Gatherings with Friends and Family: Once a week    Attends Religious Services: More than 4 times per year    Active Member of [employer] or Organizations: No    Attends Banker Meetings: Never    Marital Status: Married  Catering manager Violence: Not At Risk  (02/26/2024)   Humiliation, Afraid, Rape, and Kick questionnaire    Fear of Current or Ex-Partner: No    Emotionally Abused: No    Physically Abused: No    Sexually Abused: No      Modena Callander, M.D. Ph.D.  [facility] [address]  [place], [place] [address] Ph: [phone] Fax: [phone]  CC:  Maurice Sharlet RAMAN, PA-C [address] [place],  [place] [address]  [name], NP
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
## 2024-04-11 NOTE — Progress Notes (Signed)
 [facility] [phone]   [phone]  INITIAL CONSULT NOTE  Patient Care Team: Troy Philippe SAUNDERS, NP as PCP - General (Family Medicine) Legrand, Troy LITTIE MOULD, MD as Consulting Physician (Gastroenterology) Troy Duncans, MD as Consulting Physician (Neurosurgery)  Hematological/Oncological History # Thrombocytopenia 2/2 to Cirrhosis 03/25/2024: WBC 6.6, Hgb 12.1, MCV 93.4, Plt 129  04/11/2024: establish care with Dr. Federico   CHIEF COMPLAINTS/PURPOSE OF CONSULTATION:  Thrombocytopenia   HISTORY OF PRESENTING ILLNESS:  Troy Bishop 33 y.o. male with medical history significant for CVA in May 2025, cirrhosis of the liver 2/2 to EtOH use who presents for evaluation of thrombocytopenia.   On review of the previous records Troy Bishop had labs drawn on 03/25/2024 which showed white blood cell 6.6, hemoglobin 12.1, MCV 93.4, and platelets of 129.  Due to concern for this patient's chronic thrombocytopenia he was referred to hematology for further evaluation and management.  On exam today Troy Bishop reports that he has had no overt signs of bleeding.  He denies any nosebleeds, gum bleeding, or blood in the urine or stool.  He reports that he does not have any issues with constipation or straining.  He notes that he eats well and does not have any special dietary restrictions.  He does eat chicken and rice but not much in the way of red meat or Cal meat.  He reports he does have some occasional bouts of runny nose.  He reports his energy today is about a 3 out of 10.  He is not having any lightheadedness, dizziness, or shortness of breath.  On further discussion he reports his mom and dad are both healthy.  He reports he has 4 children who are healthy.  He reports that he quit smoking after his stroke in May 2025.  He does drink about 2 cans of beer per day.  He notes that he does follow with Dr. Legrand in gastroenterology and does have an upcoming visit later this month.  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  Unfortunately he has lost some functioning on the left side of his body due to his CVA.  He otherwise denies any fevers, chills, sweats, nausea, vomiting or diarrhea.  A full 10 point ROS is otherwise negative.  MEDICAL HISTORY:  Past Medical History:  Diagnosis Date   Hyperlipidemia    Hypertension    Liver disease    Painful orthopaedic hardware (HCC) 07/2017   right tibia   Seizures (HCC)    Stroke (cerebrum) (HCC)    Thrombocytopenia (HCC)    Verruca vulgaris 2023   penile mass    SURGICAL HISTORY: Past Surgical History:  Procedure Laterality Date   BREATH TEK H PYLORI N/A 09/08/2016   Procedure: BREATH TEK VEAR LORA;  Surgeon: Troy LITTIE Legrand MOULD, MD;  Location: [facility];  Service: Gastroenterology;  Laterality: N/A;   HARDWARE REMOVAL Left 07/27/2017   Procedure: Removal of deep implants right proximal and distal tibia;  Surgeon: Kit Rush, MD;  Location: [facility];  Service: Orthopedics;  Laterality: Left;   TENDON REPAIR Left 08/14/2014   Procedure: LEFT HAND WOUND EXPLORATION AND TENDON REPAIR;  Surgeon: Prentice LELON Pagan, MD;  Location: [facility];  Service: Orthopedics;  Laterality: Left;   TIBIA IM NAIL INSERTION  07/31/2012   Procedure: INTRAMEDULLARY (IM) NAIL TIBIAL;  Surgeon: Rush Kit, MD;  Location: [facility];  Service: Orthopedics;  Laterality: Left;   UPPER GI  ENDOSCOPY  07/25/2016    SOCIAL HISTORY: Social History   Socioeconomic History   Marital status: Married    Spouse name: Not on file   Number of children: 4   Years of education: Not on file   Highest education level: Not on file  Occupational History   Not on file  Tobacco Use   Smoking status: Former    Current packs/day: 0.00    Types: Cigarettes    Quit date: 05/13/2014    Years since quitting: 9.9   Smokeless tobacco: Former  Building services engineer status: Never Used  Substance and Sexual Activity    Alcohol  use: Yes    Comment: occasionally   Drug use: No   Sexual activity: Not Currently  Other Topics Concern   Not on file  Social History Narrative   ** Merged History Encounter **       Social Drivers of Health   Financial Resource Strain: Low Risk  (02/26/2024)   Overall Financial Resource Strain (CARDIA)    Difficulty of Paying Living Expenses: Not hard at all  Food Insecurity: No Food Insecurity (04/11/2024)   Hunger Vital Sign    Worried About Running Out of Food in the Last Year: Never true    Ran Out of Food in the Last Year: Never true  Transportation Needs: No Transportation Needs (04/11/2024)   PRAPARE - Administrator, Civil Service (Medical): No    Lack of Transportation (Non-Medical): No  Physical Activity: Inactive (02/26/2024)   Exercise Vital Sign    Days of Exercise per Week: 0 days    Minutes of Exercise per Session: 0 min  Stress: No Stress Concern Present (02/26/2024)   [employer] of Occupational Health - Occupational Stress Questionnaire    Feeling of Stress: Only a little  Social Connections: Moderately Integrated (02/26/2024)   Social Connection and Isolation Panel    Frequency of Communication with Friends and Family: More than three times a week    Frequency of Social Gatherings with Friends and Family: Once a week    Attends Religious Services: More than 4 times per year    Active Member of [employer] or Organizations: No    Attends Banker Meetings: Never    Marital Status: Married  Catering manager Violence: Not At Risk (04/11/2024)   Humiliation, Afraid, Rape, and Kick questionnaire    Fear of Current or Ex-Partner: No    Emotionally Abused: No    Physically Abused: No    Sexually Abused: No    FAMILY HISTORY: Family History  Problem Relation Age of Onset   Hypertension Mother    Hypertension Father    Hypertension Maternal Grandfather    Esophageal cancer Neg Hx    Liver disease Neg Hx    Colon cancer Neg Hx      ALLERGIES:  has no known allergies.  MEDICATIONS:  Current Outpatient Medications  Medication Sig Dispense Refill   amLODipine  (NORVASC ) 10 MG tablet TAKE 1 TABLET(10 MG) BY MOUTH DAILY 90 tablet 1   artificial tears ophthalmic solution Place 1 drop into both eyes as needed for dry eyes. 15 mL 0   atorvastatin  (LIPITOR) 40 MG tablet TAKE 1 TABLET(40 MG) BY MOUTH DAILY 90 tablet 1   Baclofen  5 MG TABS Take 1 tablet (5 mg total) by mouth 2 (two) times daily. 60 tablet 0   carvedilol  (COREG ) 25 MG tablet TAKE 1 TABLET(25 MG) BY MOUTH TWICE DAILY WITH A MEAL  180 tablet 0   divalproex  (DEPAKOTE  ER) 500 MG 24 hr tablet Take 1 tablet (500 mg total) by mouth at bedtime. 30 tablet 11   folic acid  (FOLVITE ) 1 MG tablet Take 1 tablet (1 mg total) by mouth daily. 90 tablet 0   gabapentin  (NEURONTIN ) 100 MG capsule Take 2 capsules (200 mg total) by mouth 2 (two) times daily. 120 capsule 0   hydrOXYzine  (VISTARIL ) 25 MG capsule Take 1 capsule (25 mg total) by mouth every 8 (eight) hours as needed for itching. 30 capsule 0   losartan  (COZAAR ) 100 MG tablet TAKE 1 TABLET(100 MG) BY MOUTH DAILY 90 tablet 1   Multiple Vitamin (MULTIVITAMIN WITH MINERALS) TABS tablet Take 1 tablet by mouth daily. 30 tablet 0   naphazoline-glycerin  (CLEAR EYES REDNESS) 0.012-0.25 % SOLN Place 2 drops into the left eye 3 (three) times daily. 30 mL 0   pantoprazole  (PROTONIX ) 40 MG tablet TAKE 1 TABLET(40 MG) BY MOUTH DAILY 90 tablet 1   risankizumab-rzaa (SKYRIZI PEN) 150 MG/ML pen Inject 150 mg as directed every 3 (three) months.     thiamine  (VITAMIN B1) 100 MG tablet Take 1 tablet (100 mg total) by mouth daily. 90 tablet 0   Current Facility-Administered Medications  Medication Dose Route Frequency Provider Last Rate Last Admin   betamethasone  acetate-betamethasone  sodium phosphate  (CELESTONE ) injection 12 mg  12 mg Intramuscular Once         REVIEW OF SYSTEMS:   Constitutional: ( - ) fevers, ( - )  chills , ( - )  night sweats Eyes: ( - ) blurriness of vision, ( - ) double vision, ( - ) watery eyes Ears, nose, mouth, throat, and face: ( - ) mucositis, ( - ) sore throat Respiratory: ( - ) cough, ( - ) dyspnea, ( - ) wheezes Cardiovascular: ( - ) palpitation, ( - ) chest discomfort, ( - ) lower extremity swelling Gastrointestinal:  ( - ) nausea, ( - ) heartburn, ( - ) change in bowel habits Skin: ( - ) abnormal skin rashes Lymphatics: ( - ) new lymphadenopathy, ( - ) easy bruising Neurological: ( - ) numbness, ( - ) tingling, ( - ) new weaknesses Behavioral/Psych: ( - ) mood change, ( - ) new changes  All other systems were reviewed with the patient and are negative.  PHYSICAL EXAMINATION:  Vitals:   04/11/24 1356  BP: 123/80  Pulse: 83  Resp: 15  Temp: 98.7 F (37.1 C)  SpO2: 100%   Filed Weights   04/11/24 1356  Weight: 151 lb 11.2 oz (68.8 kg)    GENERAL: well appearing middle-age Asian male in NAD  SKIN: skin color, texture, turgor are normal, no rashes or significant lesions EYES: conjunctiva are pink and non-injected, sclera clear LUNGS: clear to auscultation and percussion with normal breathing effort HEART: regular rate & rhythm and no murmurs and no lower extremity edema Musculoskeletal: no cyanosis of digits and no clubbing  PSYCH: alert & oriented x 3, fluent speech NEURO: Left-sided atrophy and deficits, otherwise within normal limits.  LABORATORY DATA:  I have reviewed the data as listed    Latest Ref Rng & Units 04/11/2024    2:35 PM 03/25/2024   10:29 AM 02/26/2024    2:25 PM  CBC  WBC 4.0 - 10.5 K/uL 4.7  6.6  5.3   Hemoglobin 13.0 - 17.0 g/dL 86.6  87.8  87.6   Hematocrit 39.0 - 52.0 % 40.0  36.6  36.7  Platelets 150 - 400 K/uL 108  129.0  85.0 Repeated and verified X2.        Latest Ref Rng & Units 04/11/2024    2:35 PM 02/26/2024    2:25 PM 01/29/2024    5:06 AM  CMP  Glucose 70 - 99 mg/dL 873  864  889   BUN 6 - 20 mg/dL 13  13  11    Creatinine 0.61 - 1.24  mg/dL 9.26  9.32  9.27   Sodium 135 - 145 mmol/L 141  138  136   Potassium 3.5 - 5.1 mmol/L 3.4  3.7  3.7   Chloride 98 - 111 mmol/L 104  101  106   CO2 22 - 32 mmol/L 29  29  25    Calcium  8.9 - 10.3 mg/dL 89.6  9.7  9.1   Total Protein 6.5 - 8.1 g/dL 9.4  8.0    Total Bilirubin 0.0 - 1.2 mg/dL 0.7  0.7    Alkaline Phos 38 - 126 U/L 73  64    AST 15 - 41 U/L 22  17    ALT 0 - 44 U/L 24  20       ASSESSMENT & PLAN Basir Mcmahan 33 y.o. male with medical history significant for CVA in May 2025, cirrhosis of the liver 2/2 to EtOH use who presents for evaluation of thrombocytopenia.   After review of the labs, review of the records, and discussion with the patient the patients findings are most consistent with thrombocytopenia in the setting of cirrhosis of the liver.  Thrombocytopenia is a common condition with a broad differential. The possible etiologies of thrombocytopenia include liver disease, splenomegaly, infectious process, nutritional deficiency, consumption/autoimmune destruction, pseudothrombocytopenia, and bone marrow disorders. Cirrhosis and liver disease the most common causes of moderate thrombocytopenia. Evaluation should include full hepatitis serologies (Hep B and C) as well as HIV. Imaging of the liver spleen should be performed with an abdominal US  ( if prior imaging is no readily available). Nutritional etiologies should be ruled out with Vitamin b12 and folate testing.  A peripheral blood smear can help determine if there is clumping leading to pseudothrombocytopenia. If no clear etiology can be found would need to consider immune thrombocytopenia (ITP) with consideration of bone marrow biopsy.   #Thrombocytopenia  --Etiology is most likely due to the patient cirrhosis, however will order nutritional studies and additional tests to assure no other possible underlying cause. --will order CBC, CMP, Vitamin b12 and folate.   --will order immature platelet fraction and platelet by  citrate --viral serologies with Hepatitis B, Hepatitis C negative in 2023 with negative HIV in May 2025.  --patient underwent abdominal US  on 03/25/2023 which showed hepatic steatosis with concerns for possible underlying cirrhosis.  Patient follows with Dr. Legrand at [facility] --If the patient were to have platelets below 50 and require a procedure or if platelets were to consistently drop below 20 we are happy to see the patient back for consideration of avatrombopag --RTC on an as-needed basis or if there are abnormalities with the above studies.  Orders Placed This Encounter  Procedures   CBC with Differential (Cancer Center Only)    Standing Status:   Future    Number of Occurrences:   1    Expiration Date:   04/11/2025   CMP (Cancer Center only)    Standing Status:   Future    Number of Occurrences:   1    Expiration Date:   04/11/2025  Immature Platelet Fraction    Standing Status:   Future    Number of Occurrences:   1    Expiration Date:   04/11/2025   WBC/PLT in Citrate   Vitamin B12    Standing Status:   Future    Number of Occurrences:   1    Expiration Date:   04/11/2025   Folate, Serum    Standing Status:   Future    Number of Occurrences:   1    Expiration Date:   04/11/2025    All questions were answered. The patient knows to call the clinic with any problems, questions or concerns.  A total of more than 60 minutes were spent on this encounter with face-to-face time and non-face-to-face time, including preparing to see the patient, ordering tests and/or medications, counseling the patient and coordination of care as outlined above.   Norleen IVAR Kidney, MD Department of Hematology/Oncology [facility] at [facility] Phone: [phone] Pager: [phone] Email: [email or web address]  04/14/2024 3:54 PM

## 2024-04-15 ENCOUNTER — Ambulatory Visit: Admitting: Occupational Therapy

## 2024-04-15 DIAGNOSIS — S43002D Unspecified subluxation of left shoulder joint, subsequent encounter: Secondary | ICD-10-CM

## 2024-04-15 DIAGNOSIS — M6281 Muscle weakness (generalized): Secondary | ICD-10-CM | POA: Diagnosis not present

## 2024-04-15 DIAGNOSIS — R29818 Other symptoms and signs involving the nervous system: Secondary | ICD-10-CM

## 2024-04-15 DIAGNOSIS — R278 Other lack of coordination: Secondary | ICD-10-CM
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
## 2024-04-16 ENCOUNTER — Other Ambulatory Visit (INDEPENDENT_AMBULATORY_CARE_PROVIDER_SITE_OTHER)

## 2024-04-16 ENCOUNTER — Ambulatory Visit: Admitting: Gastroenterology

## 2024-04-16 ENCOUNTER — Encounter: Payer: Self-pay | Admitting: Gastroenterology

## 2024-04-16 VITALS — BP 110/70 | HR 86 | Ht 65.0 in | Wt 161.2 lb

## 2024-04-16 DIAGNOSIS — F1010 Alcohol abuse, uncomplicated: Secondary | ICD-10-CM | POA: Diagnosis not present

## 2024-04-16 DIAGNOSIS — K7030 Alcoholic cirrhosis of liver without ascites: Secondary | ICD-10-CM | POA: Diagnosis not present

## 2024-04-16 DIAGNOSIS — D696 Thrombocytopenia, unspecified: Secondary | ICD-10-CM | POA: Diagnosis not present

## 2024-04-16 NOTE — Progress Notes (Signed)
Pt p

## 2024-04-16 NOTE — Patient Instructions (Signed)
 You have been scheduled for an abdominal ultrasound at [facility] (1st floor of hospital) on 04/19/24 at 10:30 am. Please arrive 30 minutes prior to your appointment for registration. Make certain not to have anything to eat or drink 6 hours prior to your appointment. Should you need to reschedule your appointment, please contact radiology at [phone]. This test typically takes about 30 minutes to perform.  Your provider has requested that you go to the basement level for lab work before leaving today. Press B on the elevator. The lab is located at the first door on the left as you exit the elevator.   You have been scheduled for an endoscopy. Please follow written instructions given to you at your visit today.  If you use inhalers (even only as needed), please bring them with you on the day of your procedure.  If you take any of the following medications, they will need to be adjusted prior to your procedure:   DO NOT TAKE 7 DAYS PRIOR TO TEST- Trulicity (dulaglutide) Ozempic, Wegovy (semaglutide) Mounjaro (tirzepatide) Bydureon Bcise (exanatide extended release)  DO NOT TAKE 1 DAY PRIOR TO YOUR TEST Rybelsus (semaglutide) Adlyxin (lixisenatide) Victoza (liraglutide) Byetta (exanatide) ___________________________________________________________________________   _______________________________________________________  If your blood pressure at your visit was 140/90 or greater, please contact your primary care physician to follow up on this.  _______________________________________________________  If you are age 40 or older, your body mass index should be between 23-30. Your Body mass index is 26.83 kg/m. If this is out of the aforementioned range listed, please consider follow up with your Primary Care Provider.  If you are age 25 or younger, your body mass index should be between 19-25. Your Body mass index is 26.83 kg/m. If this is out of the aformentioned range  listed, please consider follow up with your Primary Care Provider.   ________________________________________________________  The [facility] providers would like to encourage you to use MYCHART to communicate with providers for non-urgent requests or questions.  Due to long hold times on the telephone, sending your provider a message by [facility] may be a faster and more efficient way to get a response.  Please allow 48 business hours for a response.  Please remember that this is for non-urgent requests.  _______________________________________________________  [facility] is using a team-based approach to care.  Your team is made up of your doctor and two to three APPS. Our APPS (Nurse Practitioners and Physician Assistants) work with your physician to ensure care continuity for you. They are fully qualified to address your health concerns and develop a treatment plan. They communicate directly with your gastroenterologist to care for you. Seeing the Advanced Practice Practitioners on your physician's team can help you by facilitating care more promptly, often allowing for earlier appointments, access to diagnostic testing, procedures, and other specialty referrals.   Thank you for trusting me with your gastrointestinal care!    Dr. Victory Legrand DOUGLAS [facility]

## 2024-04-16 NOTE — Progress Notes (Signed)
 [facility] Progress Note  Chief Complaint: Cirrhosis  Subjective  Prior history EtOH related cirrhosis without ascites Ongoing alcohol  use Clinic visit Nov 2024 with dyspepsia - 2017/2018 H pylori treatment and clearance   Discussed the use of AI scribe software for clinical note transcription with the patient, who gave verbal consent to proceed.  History of Present Illness   Troy Bishop was here for follow-up of his cirrhosis.  Unfortunately, he continues to to drink beer regularly against advice.  He had his wife on speaker phone during the visit and she also confirms ongoing alcohol  use. In May of this year he was hospitalized for intracranial hemorrhage/CVA, for which he has been ongoing rehab and has continued left-sided weakness affecting his mobility.  His alcohol  level was positive at time of that admission and he was also hypertensive.  He does not report dysphagia, odynophagia, nausea vomiting abdominal pain altered bowel habits or rectal bleeding.  ROS: Cardiovascular:  no chest pain Respiratory: no dyspnea Left-sided arm and leg weakness  Back pain  Remainder systems negative except as above   Past Medical History:  Diagnosis Date   Hyperlipidemia    Hypertension    Liver disease    Painful orthopaedic hardware (HCC) 07/2017   right tibia   Seizures (HCC)    Stroke (cerebrum) (HCC)    Thrombocytopenia (HCC)    Verruca vulgaris 2023   penile mass    Past Surgical History:  Procedure Laterality Date   BREATH TEK H PYLORI N/A 09/08/2016   Procedure: BREATH TEK VEAR LORA;  Surgeon: Victory LITTIE Legrand DOUGLAS, MD;  Location: [facility];  Service: Gastroenterology;  Laterality: N/A;   HARDWARE REMOVAL Left 07/27/2017   Procedure: Removal of deep implants right proximal and distal tibia;  Surgeon: Kit Rush, MD;  Location: [facility];  Service: Orthopedics;  Laterality: Left;   TENDON REPAIR Left 08/14/2014   Procedure: LEFT HAND WOUND EXPLORATION AND  TENDON REPAIR;  Surgeon: Prentice LELON Pagan, MD;  Location: [facility];  Service: Orthopedics;  Laterality: Left;   TIBIA IM NAIL INSERTION  07/31/2012   Procedure: INTRAMEDULLARY (IM) NAIL TIBIAL;  Surgeon: Rush Kit, MD;  Location: [facility];  Service: Orthopedics;  Laterality: Left;   UPPER GI ENDOSCOPY  07/25/2016     Objective:  Med list reviewed  Current Outpatient Medications:    amLODipine  (NORVASC ) 10 MG tablet, TAKE 1 TABLET(10 MG) BY MOUTH DAILY, Disp: 90 tablet, Rfl: 1   artificial tears ophthalmic solution, Place 1 drop into both eyes as needed for dry eyes., Disp: 15 mL, Rfl: 0   atorvastatin  (LIPITOR) 40 MG tablet, TAKE 1 TABLET(40 MG) BY MOUTH DAILY, Disp: 90 tablet, Rfl: 1   Baclofen  5 MG TABS, Take 1 tablet (5 mg total) by mouth 2 (two) times daily., Disp: 60 tablet, Rfl: 0   carvedilol  (COREG ) 25 MG tablet, TAKE 1 TABLET(25 MG) BY MOUTH TWICE DAILY WITH A MEAL, Disp: 180 tablet, Rfl: 0   divalproex  (DEPAKOTE  ER) 500 MG 24 hr tablet, Take 1 tablet (500 mg total) by mouth at bedtime., Disp: 30 tablet, Rfl: 11   folic acid  (FOLVITE ) 1 MG tablet, Take 1 tablet (1 mg total) by mouth daily., Disp: 90 tablet, Rfl: 0   gabapentin  (NEURONTIN ) 100 MG capsule, Take 2 capsules (200 mg total) by mouth 2 (two) times daily., Disp: 120 capsule, Rfl: 0   hydrOXYzine  (VISTARIL ) 25 MG capsule, Take 1 capsule (25 mg total) by mouth every  8 (eight) hours as needed for itching., Disp: 30 capsule, Rfl: 0   losartan  (COZAAR ) 100 MG tablet, TAKE 1 TABLET(100 MG) BY MOUTH DAILY, Disp: 90 tablet, Rfl: 1   Multiple Vitamin (MULTIVITAMIN WITH MINERALS) TABS tablet, Take 1 tablet by mouth daily., Disp: 30 tablet, Rfl: 0   naphazoline-glycerin  (CLEAR EYES REDNESS) 0.012-0.25 % SOLN, Place 2 drops into the left eye 3 (three) times daily., Disp: 30 mL, Rfl: 0   pantoprazole  (PROTONIX ) 40 MG tablet, TAKE 1 TABLET(40 MG) BY MOUTH DAILY, Disp: 90 tablet, Rfl: 1   risankizumab-rzaa (SKYRIZI PEN) 150  MG/ML pen, Inject 150 mg as directed every 3 (three) months., Disp: , Rfl:    thiamine  (VITAMIN B1) 100 MG tablet, Take 1 tablet (100 mg total) by mouth daily., Disp: 90 tablet, Rfl: 0  Current Facility-Administered Medications:    betamethasone  acetate-betamethasone  sodium phosphate  (CELESTONE ) injection 12 mg, 12 mg, Intramuscular, Once,    Vital signs in last 24 hrs: Vitals:   04/16/24 1322  BP: 110/70  Pulse: 86   Wt Readings from Last 3 Encounters:  04/16/24 161 lb 4 oz (73.1 kg)  04/11/24 151 lb 11.2 oz (68.8 kg)  04/11/24 151 lb (68.5 kg)    Physical Exam  Slow gait, uses a cane, can get on exam table slowly but independently. HEENT: sclera anicteric, oral mucosa moist without lesions Neck: supple, no thyromegaly, JVD or lymphadenopathy Cardiac: Regular without appreciable murmur,  no peripheral edema Pulm: clear to auscultation bilaterally, normal RR and effort noted Abdomen: soft, no tenderness, with active bowel sounds. No guarding or palpable hepatosplenomegaly.  No distention Skin; warm and dry, no jaundice or rash Slight facial droop, left arm and leg weakness, left lower leg with orthotic support  Labs:     Latest Ref Rng & Units 04/11/2024    2:35 PM 03/25/2024   10:29 AM 02/26/2024    2:25 PM  CBC  WBC 4.0 - 10.5 K/uL 4.7  6.6  5.3   Hemoglobin 13.0 - 17.0 g/dL 86.6  87.8  87.6   Hematocrit 39.0 - 52.0 % 40.0  36.6  36.7   Platelets 150 - 400 K/uL 108  129.0  85.0 Repeated and verified X2.       Latest Ref Rng & Units 04/11/2024    2:35 PM 02/26/2024    2:25 PM 01/29/2024    5:06 AM  CMP  Glucose 70 - 99 mg/dL 873  864  889   BUN 6 - 20 mg/dL 13  13  11    Creatinine 0.61 - 1.24 mg/dL 9.26  9.32  9.27   Sodium 135 - 145 mmol/L 141  138  136   Potassium 3.5 - 5.1 mmol/L 3.4  3.7  3.7   Chloride 98 - 111 mmol/L 104  101  106   CO2 22 - 32 mmol/L 29  29  25    Calcium  8.9 - 10.3 mg/dL 89.6  9.7  9.1   Total Protein 6.5 - 8.1 g/dL 9.4  8.0    Total Bilirubin  0.0 - 1.2 mg/dL 0.7  0.7    Alkaline Phos 38 - 126 U/L 73  64    AST 15 - 41 U/L 22  17    ALT 0 - 44 U/L 24  20     Last INR 1.1 in May 2025 No AFP on file  ___________________________________________ Radiologic studies:   ____________________________________________ Other:   _____________________________________________   Encounter Diagnoses  Name Primary?   Alcoholic cirrhosis  of liver without ascites (HCC) Yes   ETOH abuse    Thrombocytopenia (HCC)     Assessment and Plan Assessment & Plan  Cirrhosis from alcohol  abuse with ongoing alcohol  use disorder.  He has poor insight about that  Thrombocytopenia from portal hypertension and probable marrow suppression from alcohol  use  He is due for hepatocellular carcinoma screening and he should be screened for esophageal varices as well. Plan: AFP, and also INR to update his MELD score  Right upper quadrant ultrasound  Upper endoscopy with me in the hospital outpatient endoscopy lab.  This is for variceal screening.  The benefits and risks of the planned procedure(s) were described in detail with the patient or (when appropriate) their health care proxy.  Risks were outlined as including, but not limited to, bleeding, infection, perforation, adverse medication reaction leading to cardiac or pulmonary decompensation, pancreatitis (if ERCP).  The limitation of incomplete mucosal visualization was also discussed.  No guarantees or warranties were given.   The benefits and risks of the planned procedure(s) were described in detail with the patient or (when appropriate) their health care proxy.  Risks were outlined as including, but not limited to, bleeding, infection, perforation, adverse medication reaction leading to cardiac or pulmonary decompensation, pancreatitis (if ERCP).  The limitation of incomplete mucosal visualization was also discussed.  No guarantees or warranties were given. Patient at increased risk for  cardiopulmonary complications of procedure due to medical comorbidities.   35 minutes were spent on this encounter (including chart review, history/exam, counseling/coordination of care, and documentation) > 50% of that time was spent on counseling and coordination of care.   Victory LITTIE Brand III

## 2024-04-16 NOTE — Addendum Note (Signed)
 Addended by: Fruma Africa on: 04/16/2024 02:38 PM   Modules accepted: Orders

## 2024-04-17 ENCOUNTER — Ambulatory Visit: Admitting: Physical Therapy

## 2024-04-17 ENCOUNTER — Encounter: Payer: Self-pay | Admitting: Physical Therapy

## 2024-04-17 ENCOUNTER — Encounter: Payer: Self-pay | Admitting: Speech Pathology

## 2024-04-17 ENCOUNTER — Ambulatory Visit: Admitting: Speech Pathology

## 2024-04-17 ENCOUNTER — Ambulatory Visit: Admitting: Occupational Therapy

## 2024-04-17 VITALS — BP 123/75 | HR 76

## 2024-04-17 DIAGNOSIS — M6281 Muscle weakness (generalized): Secondary | ICD-10-CM

## 2024-04-17 DIAGNOSIS — R29898 Other symptoms and signs involving the musculoskeletal system: Secondary | ICD-10-CM

## 2024-04-17 DIAGNOSIS — R471 Dysarthria and anarthria: Secondary | ICD-10-CM

## 2024-04-17 DIAGNOSIS — R29818 Other symptoms and signs involving the nervous system: Secondary | ICD-10-CM

## 2024-04-17 DIAGNOSIS — R41842 Visuospatial deficit: Secondary | ICD-10-CM

## 2024-04-17 DIAGNOSIS — I629 Nontraumatic intracranial hemorrhage, unspecified: Secondary | ICD-10-CM

## 2024-04-17 DIAGNOSIS — R2689 Other abnormalities of gait and mobility: Secondary | ICD-10-CM

## 2024-04-17 DIAGNOSIS — S43002D Unspecified subluxation of left shoulder joint, subsequent encounter: Secondary | ICD-10-CM

## 2024-04-17 DIAGNOSIS — R278 Other lack of coordination: Secondary | ICD-10-CM

## 2024-04-17 DIAGNOSIS — R2681 Unsteadiness on feet: Secondary | ICD-10-CM

## 2024-04-17 LAB — PROTIME-INR
INR: 1
Prothrombin Time: 10.4 s (ref 9.0–11.5)

## 2024-04-17 NOTE — Patient Instructions (Signed)
Do exercises 20x each, 3x a day - in the mirror, slow and big  1. Alternate pucker and smile - [id number]  2. Open mouth big Ahh-OOO with mouth open big  3. Pucker and move your lips side to side  4. Puff up your cheeks with air BIG - swish air from side to side  5. Press lips together flat and pop them open   6. Pucker and kiss big  7. Hold tongue depressor in your lips (no teeth) out straight - hold for    ----- minutes
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
## 2024-04-17 NOTE — Patient Instructions (Signed)
 What is constraint-induced movement therapy (CIMT)? After a stroke, regaining strength and function in your weaker arm (the side weakened by the stroke) can be challenging. Constraint-Induced Movement Therapy (CIMT) involves intensive training of the weaker arm while restricting the use of the stronger arm. Specifically, the use of the stronger arm is restricted by the use of a mitten, sling, sitting on your opposite hand, or strapping the opposite hand to the side of your body for much of each day. The idea is to encourage you to use your weaker hand to do daily activities. This therapy has been studied by high quality research studies and has been found beneficial for arm function in some patients- especially those who already have some use of their arm and hand. Recommend starting with 15 minute increments and gradually increasing.   Use of a mitten while writing. The mitten makes it impossible to use the good arm. The woman is forced to use her weaker hand to write

## 2024-04-18 ENCOUNTER — Ambulatory Visit: Admitting: Neurology

## 2024-04-18 DIAGNOSIS — I610 Nontraumatic intracerebral hemorrhage in hemisphere, subcortical: Secondary | ICD-10-CM | POA: Diagnosis not present

## 2024-04-18 DIAGNOSIS — F32A Depression, unspecified: Secondary | ICD-10-CM

## 2024-04-18 DIAGNOSIS — I69354 Hemiplegia and hemiparesis following cerebral infarction affecting left non-dominant side: Secondary | ICD-10-CM

## 2024-04-18 LAB — AFP TUMOR MARKER: AFP-Tumor Marker: 3.2 ng/mL (ref <6.1)

## 2024-04-19 ENCOUNTER — Ambulatory Visit (HOSPITAL_COMMUNITY)
Admission: RE | Admit: 2024-04-19 | Discharge: 2024-04-19 | Disposition: A | Discharge status: Home / Self Care | Source: Ambulatory Visit | Attending: Gastroenterology | Admitting: Gastroenterology

## 2024-04-19 DIAGNOSIS — K7030 Alcoholic cirrhosis of liver without ascites: Secondary | ICD-10-CM | POA: Diagnosis present

## 2024-04-19 NOTE — Procedures (Signed)
   HISTORY: 33 year old male with history of alcohol  abuse, large right basal ganglia hemorrhagic infarction extending to right temporal pole  TECHNIQUE:  This is a routine 16 channel EEG recording with one channel devoted to a limited EKG recording.  It was performed during wakefulness, drowsiness and asleep.  Hyperventilation and photic stimulation were performed as activating procedures.  There are minimum muscle and movement artifact noted.  Upon maximum arousal, posterior dominant waking rhythm consistent of dysrhythmic low amplitude theta range activity. Activities are symmetric over the bilateral posterior derivations and attenuated with eye opening.  Photic stimulation did not alter the tracing.  Hyperventilation produced mild/moderate buildup with higher amplitude and the slower activities noted.  During EEG recording, patient developed drowsiness and no deeper stage of sleep was achieved During EEG recording, there was no epileptiform discharge noted.  EKG demonstrate normal sinus rhythm.  CONCLUSION: This is an abnormal EEG.  There is electrodiagnostic evidence of moderate background slowing consistent with moderate bihemispheric malfunction.  There was no evidence of epileptiform discharge.  Macintyre Alexa, M.D. Ph.D.  [facility] [address] [place], [place] [address] Phone: [phone] Fax:      [phone]

## 2024-04-22 ENCOUNTER — Ambulatory Visit: Admitting: Occupational Therapy

## 2024-04-22 ENCOUNTER — Ambulatory Visit: Admitting: Physical Therapy

## 2024-04-22 VITALS — BP 118/73 | HR 79

## 2024-04-22 DIAGNOSIS — M6281 Muscle weakness (generalized): Secondary | ICD-10-CM | POA: Diagnosis not present

## 2024-04-22 DIAGNOSIS — R29818 Other symptoms and signs involving the nervous system: Secondary | ICD-10-CM

## 2024-04-22 DIAGNOSIS — R2689 Other abnormalities of gait and mobility: Secondary | ICD-10-CM

## 2024-04-22 DIAGNOSIS — R29898 Other symptoms and signs involving the musculoskeletal system: Secondary | ICD-10-CM

## 2024-04-22 DIAGNOSIS — S43002D Unspecified subluxation of left shoulder joint, subsequent encounter: Secondary | ICD-10-CM

## 2024-04-22 DIAGNOSIS — R278 Other lack of coordination: Secondary | ICD-10-CM

## 2024-04-22 DIAGNOSIS — R2681 Unsteadiness on feet: Secondary | ICD-10-CM
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
## 2024-04-22 NOTE — Patient Instructions (Signed)
 Kinesio taping to support the neuromuscular system and promote functional positioning and movement of L UE for activities with decreased pain complaints. Kinesio Taping (KT) technique applied to left shoulder to address glenohumeral subluxation/instability and pain. Tape applied using mechanical correction and space correction techniques with moderate tension to facilitate humeral head approximation toward the glenoid fossa, provide external support to limit downward displacement due to gravity and muscle weakness and improve proprioceptive input for posture and positioning during seated activities.  Taping pattern included: Middle deltoid strip for stabilization, posterior deltoid strip with 30-50% tension to support shoulder approximation and anterior deltoid stirp for superior shoulder capsule positioning to address pain and poor positioning otherwise.  Patient tolerated application well and reported good comfort prior to leaving therapy.  Education provided on wear 3-5 days and removal techniques 24+ hours before returning to OT next week.

## 2024-04-24 ENCOUNTER — Encounter: Admitting: Speech Pathology

## 2024-04-24 ENCOUNTER — Ambulatory Visit: Admitting: Occupational Therapy

## 2024-04-25 ENCOUNTER — Other Ambulatory Visit: Payer: Self-pay | Admitting: Family Medicine

## 2024-04-25 MED ORDER — GABAPENTIN 100 MG PO CAPS: 200.0000 mg | ORAL_CAPSULE | Freq: Two times a day (BID) | ORAL | 0 refills | Status: DC

## 2024-04-25 NOTE — Telephone Encounter (Signed)
 Copied from CRM [id number]. Topic: Clinical - Medication Refill >> Apr 25, 2024 10:13 AM Franky GRADE wrote: Medication: gabapentin  (NEURONTIN ) 100 MG capsule [[id number]]  Has the patient contacted their pharmacy? No (Agent: If no, request that the patient contact the pharmacy for the refill. If patient does not wish to contact the pharmacy document the reason why and proceed with request.) (Agent: If yes, when and what did the pharmacy advise?)  This is the patient's preferred pharmacy:  [id number] [address], [place] - [address] AT [facility] [address] & [facility] [address] [place] [address] Phone: [phone] Fax: [phone]    Is this the correct pharmacy for this prescription? Yes If no, delete pharmacy and type the correct one.   Has the prescription been filled recently? No  Is the patient out of the medication? Yes  Has the patient been seen for an appointment in the last year OR does the patient have an upcoming appointment? Yes  Can we respond through MyChart? Yes  Agent: Please be advised that Rx refills may take up to 3 business days. We ask that you follow-up with your pharmacy.

## 2024-04-26 ENCOUNTER — Other Ambulatory Visit: Payer: Self-pay | Admitting: Family Medicine

## 2024-04-26 ENCOUNTER — Telehealth: Payer: Self-pay

## 2024-04-26 MED ORDER — BACLOFEN 5 MG PO TABS: 5.0000 mg | ORAL_TABLET | Freq: Two times a day (BID) | ORAL | 0 refills | Status: DC

## 2024-04-26 NOTE — Telephone Encounter (Signed)
 Pt was scheduled for an endoscopy on 06/06/24. Was able to call pt and move him to open position on 05/20/2024. Start time 08:15 AM. New arrival time 06:45 AM. New instructions sent to pt via my chart. Pt and spouse verbalized understanding of date/time change.

## 2024-04-26 NOTE — Telephone Encounter (Signed)
 Copied from CRM [id number]. Topic: Clinical - Medication Refill >> Apr 26, 2024  1:07 PM Chiquita SQUIBB wrote: Medication: Baclofen  Baclofen  5 MG TABS   Has the patient contacted their pharmacy? Yes (Agent: If no, request that the patient contact the pharmacy for the refill. If patient does not wish to contact the pharmacy document the reason why and proceed with request.) (Agent: If yes, when and what did the pharmacy advise?)  This is the patient's preferred pharmacy:  [id number] [place], [place] - [address] AT [facility] [address] & [facility] [address] [place] [address] Phone: [phone] Fax: [phone]   Is this the correct pharmacy for this prescription? Yes If no, delete pharmacy and type the correct one.   Has the prescription been filled recently? No  Is the patient out of the medication? Yes  Has the patient been seen for an appointment in the last year OR does the patient have an upcoming appointment? Yes  Can we respond through MyChart? Yes  Agent: Please be advised that Rx refills may take up to 3 business days. We ask that you follow-up with your pharmacy.

## 2024-04-29 ENCOUNTER — Ambulatory Visit: Payer: Self-pay | Admitting: Gastroenterology

## 2024-04-29 ENCOUNTER — Ambulatory Visit: Admitting: Physical Therapy

## 2024-04-29 ENCOUNTER — Encounter: Admitting: Occupational Therapy

## 2024-04-29 ENCOUNTER — Encounter: Payer: Self-pay | Admitting: Physical Therapy

## 2024-04-29 VITALS — BP 123/74 | HR 84

## 2024-04-29 DIAGNOSIS — M6281 Muscle weakness (generalized): Secondary | ICD-10-CM

## 2024-04-29 DIAGNOSIS — R2681 Unsteadiness on feet: Secondary | ICD-10-CM

## 2024-04-29 DIAGNOSIS — R29818 Other symptoms and signs involving the nervous system: Secondary | ICD-10-CM
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
## 2024-04-30 ENCOUNTER — Ambulatory Visit
Admission: RE | Admit: 2024-04-30 | Discharge: 2024-04-30 | Disposition: A | Discharge status: Home / Self Care | Source: Ambulatory Visit | Attending: Physical Medicine & Rehabilitation | Admitting: Physical Medicine & Rehabilitation

## 2024-04-30 DIAGNOSIS — G8194 Hemiplegia, unspecified affecting left nondominant side: Secondary | ICD-10-CM

## 2024-05-01 ENCOUNTER — Encounter: Admitting: Speech Pathology

## 2024-05-01 ENCOUNTER — Ambulatory Visit: Admitting: Occupational Therapy

## 2024-05-01 ENCOUNTER — Ambulatory Visit: Admitting: Physical Therapy

## 2024-05-01 ENCOUNTER — Encounter: Payer: Self-pay | Admitting: Physical Therapy

## 2024-05-01 DIAGNOSIS — R29818 Other symptoms and signs involving the nervous system: Secondary | ICD-10-CM

## 2024-05-01 DIAGNOSIS — M6281 Muscle weakness (generalized): Secondary | ICD-10-CM

## 2024-05-01 DIAGNOSIS — R278 Other lack of coordination: Secondary | ICD-10-CM

## 2024-05-01 DIAGNOSIS — R2689 Other abnormalities of gait and mobility: Secondary | ICD-10-CM

## 2024-05-01 DIAGNOSIS — R2681 Unsteadiness on feet: Secondary | ICD-10-CM

## 2024-05-01 DIAGNOSIS — S43002D Unspecified subluxation of left shoulder joint, subsequent encounter: Secondary | ICD-10-CM
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
## 2024-05-06 ENCOUNTER — Ambulatory Visit: Admitting: Occupational Therapy

## 2024-05-06 ENCOUNTER — Ambulatory Visit: Admitting: Physical Therapy

## 2024-05-06 DIAGNOSIS — R29818 Other symptoms and signs involving the nervous system: Secondary | ICD-10-CM

## 2024-05-06 DIAGNOSIS — M6281 Muscle weakness (generalized): Secondary | ICD-10-CM | POA: Diagnosis not present

## 2024-05-06 DIAGNOSIS — R29898 Other symptoms and signs involving the musculoskeletal system: Secondary | ICD-10-CM

## 2024-05-06 DIAGNOSIS — S43002D Unspecified subluxation of left shoulder joint, subsequent encounter: Secondary | ICD-10-CM

## 2024-05-06 DIAGNOSIS — R2681 Unsteadiness on feet: Secondary | ICD-10-CM

## 2024-05-06 DIAGNOSIS — R2689 Other abnormalities of gait and mobility: Secondary | ICD-10-CM

## 2024-05-06 DIAGNOSIS — R278 Other lack of coordination: Secondary | ICD-10-CM
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
## 2024-05-07 ENCOUNTER — Encounter: Attending: Physical Medicine & Rehabilitation | Admitting: Physical Medicine & Rehabilitation

## 2024-05-07 ENCOUNTER — Encounter: Payer: Self-pay | Admitting: Physical Medicine & Rehabilitation

## 2024-05-07 VITALS — BP 123/79 | HR 76 | Ht 65.0 in | Wt 152.6 lb

## 2024-05-07 DIAGNOSIS — M7918 Myalgia, other site: Secondary | ICD-10-CM | POA: Diagnosis present

## 2024-05-07 DIAGNOSIS — L299 Pruritus, unspecified: Secondary | ICD-10-CM | POA: Diagnosis not present

## 2024-05-07 MED ORDER — LIDOCAINE HCL 1 % IJ SOLN
2.0000 mL | Freq: Once | INTRAMUSCULAR | Status: AC
  Administered 2024-05-07: 2 mL

## 2024-05-07 MED ORDER — GABAPENTIN 300 MG PO CAPS: 300.0000 mg | ORAL_CAPSULE | Freq: Two times a day (BID) | ORAL | 2 refills | Status: AC

## 2024-05-07 MED ORDER — BACLOFEN 5 MG PO TABS: 10.0000 mg | ORAL_TABLET | Freq: Two times a day (BID) | ORAL | 0 refills | Status: DC

## 2024-05-07 NOTE — Progress Notes (Signed)
 Troy Bishop

## 2024-05-07 NOTE — Progress Notes (Signed)
 Subjective:    Patient ID: Troy Bishop, male    DOB: 10-12-1990, 33 y.o.   MRN: [id number] 34 y.o. male with history of uncontrolled hypertension, thrombocytopenia, cirrhosis of the liver due to alcohol  abuse, alcohol  withdrawal seizures who was admitted on 12/16/2023 with acute onset of left-sided weakness with lethargy.  UDS showed alcohol  level at 264 and he was found to have acute large right cerebral hematoma with active extravasation/#.  He was treated with 2 units FFP as repeat CT head showed increasing signs of hemorrhage.  Dr. Gillie recommended serial CT head for monitoring.  He was started on hypertonic saline for cerebral edema and Cleviprex  added for BP control.  He had issues with agitation due to alcohol  withdrawal requiring phenobarb taper as well as Precedex .  Follow-up MRI brain showed's stable hematoma centered in right basal ganglia extending to anterior right temporal lobe with extension of edema and 5 mm leftward shift.    Dr. Jerri felt that bleed likely hypertensive in nature.  Abnormal LFTs were being monitored and thrombocytopenia was stable.  He had reports of right hip pain with x-rays done which were negative for fracture or contusion.  Left upper extremity ultrasound done due to pain and showed superficial thrombosis of left cephalic vein.  Mentation and activity tolerance were improving however he required plus to mod to max assist for standing with max cues for posture as well as max to total assist for ADLs.  He was independent working prior to admission.  CIR was recommended due to functional decline.   HPI 33 yo male with ETOH cirrhosis had Right subcortical bleed 12/16/23 with resultant left hemiparesis, balance issues, spasticity, Left shoulder pain and gait disorder  Uneven shoewear , increased foot inverter tone   He is completing outpatient therapy soon.  He would like to return to work.  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  We discussed that I do not recommend him using a sewing machine.  He has not been cleared for driving yet. His left shoulder pain is actually in his upper trapezius.  He has been using his trapezius to substitute for weak shoulder abduction. He also complains of left foot turning inward Pain Inventory Average Pain 10 Pain Right Now 5 My pain is intermittent and aching  In the last 24 hours, has pain interfered with the following? General activity 5 Relation with others 0 Enjoyment of life 5 What TIME of day is your pain at its worst? night Sleep (in general) Fair  Pain is worse with: sitting and some activites Pain improves with: therapy/exercise Relief from Meds: 0  Family History  Problem Relation Age of Onset   Hypertension Mother    Hypertension Father    Hypertension Maternal Grandfather    Esophageal cancer Neg Hx    Liver disease Neg Hx    Colon cancer Neg Hx    Social History   Socioeconomic History   Marital status: Married    Spouse name: Not on file   Number of children: 4   Years of education: Not on file   Highest education level: Not on file  Occupational History   Not on file  Tobacco Use   Smoking status: Former    Current packs/day: 0.00    Types: Cigarettes    Quit date: 05/13/2014    Years since quitting: 9.9   Smokeless tobacco: Former  Advertising account planner  Vaping status: Never Used  Substance and Sexual Activity   Alcohol  use: Yes    Comment: occasionally   Drug use: No   Sexual activity: Not Currently  Other Topics Concern   Not on file  Social History Narrative   ** Merged History Encounter **       Social Drivers of Health   Financial Resource Strain: Low Risk  (02/26/2024)   Overall Financial Resource Strain (CARDIA)    Difficulty of Paying Living Expenses: Not hard at all  Food Insecurity: No Food Insecurity (04/11/2024)   Hunger Vital Sign    Worried About Running Out of Food in the Last Year: Never true    Ran Out of  Food in the Last Year: Never true  Transportation Needs: No Transportation Needs (04/11/2024)   PRAPARE - Administrator, Civil Service (Medical): No    Lack of Transportation (Non-Medical): No  Physical Activity: Inactive (02/26/2024)   Exercise Vital Sign    Days of Exercise per Week: 0 days    Minutes of Exercise per Session: 0 min  Stress: No Stress Concern Present (02/26/2024)   [employer] of Occupational Health - Occupational Stress Questionnaire    Feeling of Stress: Only a little  Social Connections: Moderately Integrated (02/26/2024)   Social Connection and Isolation Panel    Frequency of Communication with Friends and Family: More than three times a week    Frequency of Social Gatherings with Friends and Family: Once a week    Attends Religious Services: More than 4 times per year    Active Member of [employer] or Organizations: No    Attends Banker Meetings: Never    Marital Status: Married   Past Surgical History:  Procedure Laterality Date   BREATH TEK H PYLORI N/A 09/08/2016   Procedure: BREATH TEK VEAR LORA;  Surgeon: Victory LITTIE Brand III, MD;  Location: [facility];  Service: Gastroenterology;  Laterality: N/A;   HARDWARE REMOVAL Left 07/27/2017   Procedure: Removal of deep implants right proximal and distal tibia;  Surgeon: Kit Rush, MD;  Location: [facility];  Service: Orthopedics;  Laterality: Left;   TENDON REPAIR Left 08/14/2014   Procedure: LEFT HAND WOUND EXPLORATION AND TENDON REPAIR;  Surgeon: Prentice LELON Pagan, MD;  Location: [facility];  Service: Orthopedics;  Laterality: Left;   TIBIA IM NAIL INSERTION  07/31/2012   Procedure: INTRAMEDULLARY (IM) NAIL TIBIAL;  Surgeon: Rush Kit, MD;  Location: [facility];  Service: Orthopedics;  Laterality: Left;   UPPER GI ENDOSCOPY  07/25/2016   Past Surgical History:  Procedure Laterality Date   BREATH TEK H PYLORI N/A 09/08/2016   Procedure: BREATH TEK VEAR LORA;  Surgeon:  Victory LITTIE Brand DOUGLAS, MD;  Location: [facility];  Service: Gastroenterology;  Laterality: N/A;   HARDWARE REMOVAL Left 07/27/2017   Procedure: Removal of deep implants right proximal and distal tibia;  Surgeon: Kit Rush, MD;  Location: [facility];  Service: Orthopedics;  Laterality: Left;   TENDON REPAIR Left 08/14/2014   Procedure: LEFT HAND WOUND EXPLORATION AND TENDON REPAIR;  Surgeon: Prentice LELON Pagan, MD;  Location: [facility];  Service: Orthopedics;  Laterality: Left;   TIBIA IM NAIL INSERTION  07/31/2012   Procedure: INTRAMEDULLARY (IM) NAIL TIBIAL;  Surgeon: Rush Kit, MD;  Location: [facility];  Service: Orthopedics;  Laterality: Left;   UPPER GI ENDOSCOPY  07/25/2016   Past Medical History:  Diagnosis Date   Hyperlipidemia  Hypertension    Liver disease    Painful orthopaedic hardware 07/2017   right tibia   Seizures (HCC)    Stroke (cerebrum) (HCC)    Thrombocytopenia    Verruca vulgaris 2023   penile mass   BP 123/79 (BP Location: Right Arm, Patient Position: Sitting, Cuff Size: Normal)   Pulse 76   Ht 5' 5 (1.651 m)   Wt 152 lb 9.6 oz (69.2 kg)   SpO2 98%   BMI 25.39 kg/m   Opioid Risk Score:   Fall Risk Score:  `1  Depression screen [facility] 2/9     04/11/2024    2:03 PM 03/25/2024    9:39 AM 02/26/2024    1:34 PM 02/26/2024    1:33 PM 09/28/2017    4:24 PM 09/06/2017    3:56 PM  Depression screen PHQ 2/9  Decreased Interest 0 0 0 0 0 0  Down, Depressed, Hopeless 0 0 0 0 0 0  PHQ - 2 Score 0 0 0 0 0 0  Altered sleeping  3 3     Tired, decreased energy  3 3     Change in appetite  2 2     Feeling bad or failure about yourself   0 0     Trouble concentrating  0 0     Moving slowly or fidgety/restless  0 0     Suicidal thoughts  0 0     PHQ-9 Score  8 8     Difficult doing work/chores  Somewhat difficult Somewhat difficult        Review of Systems  Musculoskeletal:  Positive for back pain and myalgias.       Left shoulder pain,  left sided back pain  All other systems reviewed and are negative.      Objective:   Physical Exam General No acute distress Mood and affect appropriate Left shoulder has some tenderness in the upper trapezius negative impingement sign has good range of motion he does have 1 fingerbreadth subluxation inferiorly on the left side at the glenohumeral. Good external rotation of the left shoulder No swelling of the left upper extremity or hand Tone MAS 1 in the finger flexors and wrist flexors on the left side 1 at the elbow flexors MAS 3 at the foot inverters.  There is clonus at the left ankle. Motor strength is 3 - at the left deltoid bicep tricep grip he has gross grasp on the left upper extremity.  He can isolate the fingers on the left side as well.        Assessment & Plan:    Left spastic hemi due to Right subcortical bleed affecting LE>UE tone.  Increased baclofen  to 10mg  BID Overall he has achieved functional independence but has not been cleared by neuro for driving. I do think he go back to work on a limited basis not using a sewing machine he can do so and by hand.  Uses his left upper extremity as a gross assist.  Will start him out on a part-time basis 4-hour days/5 days/week for the first month and if doing well he can advance him to 8-hour days.  Would also benefit from Botox 100UL tib post 50U, L soleus 50U LUE pain likely multifactorial, neurogenic plus myofascial Increased Gabapentin  to 300mg  BID, TPI today Trigger Point Injection  Indication: left trapezius Myofascial pain not relieved by medication management and other conservative care.  Informed consent was obtained after describing risk and benefits of  the procedure with the patient, this includes bleeding, bruising, infection and medication side effects.  The patient wishes to proceed and has given written consent.  The patient was placed in a seated position.  The left upper trap  area was marked and prepped  with Betadine.  It was entered with a 25-gauge 1-1/2 inch needle and 1 mL of 1% lidocaine  was injected into each of 2 trigger points, after negative draw back for blood.  The patient tolerated the procedure well.  Post procedure instructions were given.

## 2024-05-08 ENCOUNTER — Ambulatory Visit: Attending: Physician Assistant | Admitting: Occupational Therapy

## 2024-05-08 ENCOUNTER — Encounter: Admitting: Speech Pathology

## 2024-05-08 ENCOUNTER — Ambulatory Visit: Admitting: Physical Therapy

## 2024-05-08 VITALS — BP 112/71 | HR 67

## 2024-05-08 DIAGNOSIS — R278 Other lack of coordination: Secondary | ICD-10-CM | POA: Diagnosis present

## 2024-05-08 DIAGNOSIS — S43002D Unspecified subluxation of left shoulder joint, subsequent encounter: Secondary | ICD-10-CM | POA: Diagnosis present

## 2024-05-08 DIAGNOSIS — R29818 Other symptoms and signs involving the nervous system: Secondary | ICD-10-CM

## 2024-05-08 DIAGNOSIS — R2681 Unsteadiness on feet: Secondary | ICD-10-CM | POA: Diagnosis present

## 2024-05-08 DIAGNOSIS — M6281 Muscle weakness (generalized): Secondary | ICD-10-CM | POA: Diagnosis present

## 2024-05-08 DIAGNOSIS — R2689 Other abnormalities of gait and mobility: Secondary | ICD-10-CM | POA: Insufficient documentation
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
## 2024-05-13 ENCOUNTER — Telehealth: Payer: Self-pay | Admitting: Gastroenterology

## 2024-05-13 ENCOUNTER — Encounter (HOSPITAL_COMMUNITY): Payer: Self-pay | Admitting: Gastroenterology

## 2024-05-13 NOTE — Telephone Encounter (Addendum)
 Procedure:Endoscopy Procedure date: 05/20/24 Procedure location: [facility] Arrival Time: 6:45 am Spoke with the patient Y/N:   No, I left a detailed message on [phone] on 05/13/24 @ 10:08 am for the patient to return call  Yes, 05/14/24 spoke with pt's wife   Any prep concerns? No  Has the patient obtained the prep from the pharmacy ? No prep needed Do you have a care partner and transportation: Yes Any additional concerns? No

## 2024-05-13 NOTE — Progress Notes (Addendum)
 Pre op call Ranell Distance      PCP-Williamson NP Cardiologist-n/a Pulmonologist- n/a Neurologist- Onita MD  EKG-12/17/23 Echo-12/18/23 Cath-n/a Stress-n/a ICD/PM-n/a  GLP1-n/a Blood Thinner-n/a  History:HTN, Stroke, Liver Disease, Thrombocytopenia, Seizures. Patient last saw his neuro MD 9/11 for EEG, per pt was okay. Per pt no new issues, denies any seizure activity, cardiac or breathing issues. Does use cane sometimes due to old stroke residual.  Anesthesia Review- Yes- okay to proceed

## 2024-05-19 NOTE — Anesthesia Preprocedure Evaluation (Signed)
 Anesthesia Evaluation  Patient identified by MRN, date of birth, ID band Patient awake    Reviewed: Allergy & Precautions, NPO status , Patient's Chart, lab work & pertinent test results, reviewed documented beta blocker date and time   Airway Mallampati: II  TM Distance: >3 FB Neck ROM: Full    Dental  (+) Dental Advisory Given, Teeth Intact   Pulmonary former smoker   Pulmonary exam normal breath sounds clear to auscultation       Cardiovascular hypertension, Pt. on medications and Pt. on home beta blockers  Rhythm:Regular Rate:Normal  Echo 12/2023 1. Left ventricular ejection fraction, by estimation, is 60 to 65%. The left  ventricle has normal function. The left ventricle has no regional wall motion abnormalities. Left ventricular diastolic parameters were normal.   2. Right ventricular systolic function is normal. The right ventricular  size is normal.   3. Left atrial size was mildly dilated.   4. The mitral valve is normal in structure. Mild mitral valve regurgitation.   5. The aortic valve is tricuspid. Aortic valve regurgitation is not  visualized.   6. The inferior vena cava is normal in size with greater than 50%  respiratory variability, suggesting right atrial pressure of 3 mmHg.      Neuro/Psych Seizures -, Well Controlled,  PSYCHIATRIC DISORDERS Anxiety Depression    CVA, Residual Symptoms    GI/Hepatic negative GI ROS, Neg liver ROS,,,  Endo/Other  negative endocrine ROS    Renal/GU negative Renal ROS     Musculoskeletal   Abdominal   Peds  Hematology  (+) Blood dyscrasia, anemia   Anesthesia Other Findings   Reproductive/Obstetrics                              Anesthesia Physical Anesthesia Plan  ASA: 3  Anesthesia Plan: MAC   Post-op Pain Management: Minimal or no pain anticipated   Induction: Intravenous  PONV Risk Score and Plan: 2 and Ondansetron , Propofol   infusion, Treatment may vary due to age or medical condition and TIVA  Airway Management Planned: Natural Airway  Additional Equipment:   Intra-op Plan:   Post-operative Plan:   Informed Consent: I have reviewed the patients History and Physical, chart, labs and discussed the procedure including the risks, benefits and alternatives for the proposed anesthesia with the patient or authorized representative who has indicated his/her understanding and acceptance.     Dental advisory given  Plan Discussed with: CRNA  Anesthesia Plan Comments:          Anesthesia Quick Evaluation

## 2024-05-20 ENCOUNTER — Encounter (HOSPITAL_COMMUNITY)
Admission: RE | Disposition: A | Discharge status: Home / Self Care | Payer: Self-pay | Source: Home / Self Care | Attending: Gastroenterology

## 2024-05-20 ENCOUNTER — Ambulatory Visit (HOSPITAL_COMMUNITY): Payer: Self-pay | Admitting: Anesthesiology

## 2024-05-20 ENCOUNTER — Encounter (HOSPITAL_COMMUNITY): Payer: Self-pay | Admitting: Gastroenterology

## 2024-05-20 ENCOUNTER — Other Ambulatory Visit: Payer: Self-pay

## 2024-05-20 ENCOUNTER — Ambulatory Visit (HOSPITAL_COMMUNITY)
Admission: RE | Admit: 2024-05-20 | Discharge: 2024-05-20 | Disposition: A | Discharge status: Home / Self Care | Attending: Gastroenterology | Admitting: Gastroenterology

## 2024-05-20 DIAGNOSIS — F32A Depression, unspecified: Secondary | ICD-10-CM | POA: Insufficient documentation

## 2024-05-20 DIAGNOSIS — K7460 Unspecified cirrhosis of liver: Secondary | ICD-10-CM

## 2024-05-20 DIAGNOSIS — K7030 Alcoholic cirrhosis of liver without ascites: Secondary | ICD-10-CM | POA: Diagnosis present

## 2024-05-20 DIAGNOSIS — I10 Essential (primary) hypertension: Secondary | ICD-10-CM | POA: Insufficient documentation

## 2024-05-20 DIAGNOSIS — R569 Unspecified convulsions: Secondary | ICD-10-CM | POA: Diagnosis not present

## 2024-05-20 DIAGNOSIS — D696 Thrombocytopenia, unspecified: Secondary | ICD-10-CM | POA: Diagnosis not present

## 2024-05-20 DIAGNOSIS — F419 Anxiety disorder, unspecified: Secondary | ICD-10-CM | POA: Insufficient documentation

## 2024-05-20 DIAGNOSIS — Z8673 Personal history of transient ischemic attack (TIA), and cerebral infarction without residual deficits: Secondary | ICD-10-CM | POA: Insufficient documentation

## 2024-05-20 DIAGNOSIS — Z87891 Personal history of nicotine dependence: Secondary | ICD-10-CM

## 2024-05-20 DIAGNOSIS — D649 Anemia, unspecified: Secondary | ICD-10-CM | POA: Diagnosis not present

## 2024-05-20 DIAGNOSIS — F418 Other specified anxiety disorders: Secondary | ICD-10-CM

## 2024-05-20 HISTORY — PX: ESOPHAGOGASTRODUODENOSCOPY: SHX5428

## 2024-05-20 SURGERY — EGD (ESOPHAGOGASTRODUODENOSCOPY)
Anesthesia: Monitor Anesthesia Care

## 2024-05-20 MED ORDER — PROPOFOL 500 MG/50ML IV EMUL
INTRAVENOUS | Status: DC | PRN
  Administered 2024-05-20: 150 ug/kg/min via INTRAVENOUS

## 2024-05-20 MED ORDER — PROPOFOL 10 MG/ML IV BOLUS
INTRAVENOUS | Status: DC | PRN
  Administered 2024-05-20: 30 mg via INTRAVENOUS

## 2024-05-20 MED ORDER — DEXMEDETOMIDINE HCL IN NACL 80 MCG/20ML IV SOLN
INTRAVENOUS | Status: DC | PRN
  Administered 2024-05-20 (×2): 4 ug via INTRAVENOUS

## 2024-05-20 MED ORDER — SODIUM CHLORIDE 0.9 % IV SOLN: INTRAVENOUS | Status: DC

## 2024-05-20 MED ORDER — LIDOCAINE 2% (20 MG/ML) 5 ML SYRINGE
INTRAMUSCULAR | Status: DC | PRN
Start: 2024-05-20 — End: 2024-05-20
  Administered 2024-05-20: 30 mg via INTRAVENOUS

## 2024-05-20 NOTE — Transfer of Care (Signed)
 Immediate Anesthesia Transfer of Care Note  Patient: Troy Bishop  Procedure(s) Performed: Procedure(s): EGD (ESOPHAGOGASTRODUODENOSCOPY) (N/A)  Patient Location: PACU and Endoscopy Unit  Anesthesia Type:MAC  Level of Consciousness: awake, alert  and oriented  Airway & Oxygen Therapy: Patient Spontanous Breathing and Patient connected to nasal cannula oxygen  Post-op Assessment: Report given to RN and Post -op Vital signs reviewed and stable  Post vital signs: Reviewed and stable  Last Vitals:  Vitals:   05/20/24 0748  BP: 97/71  Pulse: 76  Resp: 20  Temp: 36.9 C  SpO2: 99%    Complications: No apparent anesthesia complications

## 2024-05-20 NOTE — Anesthesia Postprocedure Evaluation (Signed)
 Anesthesia Post Note  Patient: Troy Bishop  Procedure(s) Performed: EGD (ESOPHAGOGASTRODUODENOSCOPY)     Patient location during evaluation: PACU Anesthesia Type: MAC Level of consciousness: awake and alert Pain management: pain level controlled Vital Signs Assessment: post-procedure vital signs reviewed and stable Respiratory status: spontaneous breathing Cardiovascular status: stable Anesthetic complications: no   No notable events documented.  Last Vitals:  Vitals:   05/20/24 0850 05/20/24 0857  BP: 113/82 111/78  Pulse: 68 70  Resp: 15 18  Temp:    SpO2: 100% 100%    Last Pain:  Vitals:   05/20/24 0857  TempSrc:   PainSc: 0-No pain                 Norleen Pope

## 2024-05-20 NOTE — Interval H&P Note (Signed)
 History and Physical Interval Note:  05/20/2024 8:18 AM  Troy Bishop  has presented today for surgery, with the diagnosis of Alcoholic cirrhosis of liver without ascites (HCC) [K70.30].  The various methods of treatment have been discussed with the patient and family. After consideration of risks, benefits and other options for treatment, the patient has consented to  Procedure(s): EGD (ESOPHAGOGASTRODUODENOSCOPY) (N/A) as a surgical intervention.  The patient's history has been reviewed, patient examined, no change in status, stable for surgery.  I have reviewed the patient's chart and labs.  Questions were answered to the patient's satisfaction.     Victory LITTIE Brand III

## 2024-05-20 NOTE — Discharge Instructions (Signed)

## 2024-05-20 NOTE — Op Note (Signed)
 [facility] Patient Name: Troy Bishop Procedure Date: 05/20/2024 MRN: [id number] Attending MD: Victory CROME. Legrand , MD, [id number] Date of Birth: 1990-10-25 CSN: [id number] Age: 33 Admit Type: Outpatient Procedure:                Upper GI endoscopy Indications:              Cirrhosis rule out esophageal varices, EtOH related                            cirrhosis with thrombocytopenia Providers:                Victory L. Legrand, MD, Collene Edu, RN, [name], Technician Referring MD:              Medicines:                Monitored Anesthesia Care Complications:            No immediate complications. Estimated Blood Loss:     Estimated blood loss: none. Procedure:                Pre-Anesthesia Assessment:                           - Prior to the procedure, a History and Physical                            was performed, and patient medications and                            allergies were reviewed. The patient's tolerance of                            previous anesthesia was also reviewed. The risks                            and benefits of the procedure and the sedation                            options and risks were discussed with the patient.                            All questions were answered, and informed consent                            was obtained. Prior Anticoagulants: The patient has                            taken no anticoagulant or antiplatelet agents. ASA                            Grade Assessment: III - A patient with severe  systemic disease. After reviewing the risks and                            benefits, the patient was deemed in satisfactory                            condition to undergo the procedure.                           After obtaining informed consent, the endoscope was                            passed under direct vision. Throughout the                            procedure, the  patient's blood pressure, pulse, and                            oxygen saturations were monitored continuously. The                            [id number]-H190 ([id number]) Olympus endoscope was introduced                            through the mouth, and advanced to the second part                            of duodenum. The upper GI endoscopy was                            accomplished without difficulty. The patient                            tolerated the procedure well. Scope In: Scope Out: Findings:      The esophagus was normal. No varices seen      The stomach was normal. No varices seen      The cardia and gastric fundus were normal on retroflexion. No varices       seen      The examined duodenum was normal. Impression:               - Normal esophagus.                           - Normal stomach.                           - Normal examined duodenum.                           - No specimens collected. Moderate Sedation:      MAC sedation used Recommendation:           - Patient has a contact number available for                            emergencies. The  signs and symptoms of potential                            delayed complications were discussed with the                            patient. Return to normal activities tomorrow.                            Written discharge instructions were provided to the                            patient.                           - Resume previous diet.                           - Continue present medications. While no varices                            were seen, this patient should remain on his                            current carvedilol  to decrease the chance of                            variceal development.                           - Return to my office at the next available                            appointment for further evaluation of rectal                            bleeding reported today (single small-volume                             episode 2 days ago). Procedure Code(s):        --- Professional ---                           [phone], Esophagogastroduodenoscopy, flexible,                            transoral; diagnostic, including collection of                            specimen(s) by brushing or washing, when performed                            (separate procedure) Diagnosis Code(s):        --- Professional ---  K74.60, Unspecified cirrhosis of liver CPT copyright 2022 American [employer] Association. All rights reserved. The codes documented in this report are preliminary and upon coder review may  be revised to meet current compliance requirements. Marquetta Weiskopf L. Legrand, MD 05/20/2024 8:41:25 AM This report has been signed electronically. Number of Addenda: 0

## 2024-05-20 NOTE — H&P (Signed)
 History and Physical:  This patient presents for endoscopic testing for:   33 year old man here today for upper endoscopy to screen for esophageal varices in the setting of alcohol -related cirrhosis with thrombocytopenia.  Further clinical details in my office note of 04/16/2024, with no significant changes since then.  Patient is otherwise without complaints or active issues today.   Past Medical History: Past Medical History:  Diagnosis Date   Hyperlipidemia    Hypertension    Liver disease    Painful orthopaedic hardware 07/2017   right tibia   Seizures (HCC)    Stroke (cerebrum) (HCC)    Thrombocytopenia    Verruca vulgaris 2023   penile mass     Past Surgical History: Past Surgical History:  Procedure Laterality Date   BREATH TEK H PYLORI N/A 09/08/2016   Procedure: BREATH TEK VEAR LORA;  Surgeon: Victory LITTIE Legrand DOUGLAS, MD;  Location: [facility];  Service: Gastroenterology;  Laterality: N/A;   HARDWARE REMOVAL Left 07/27/2017   Procedure: Removal of deep implants right proximal and distal tibia;  Surgeon: Kit Rush, MD;  Location: [facility];  Service: Orthopedics;  Laterality: Left;   TENDON REPAIR Left 08/14/2014   Procedure: LEFT HAND WOUND EXPLORATION AND TENDON REPAIR;  Surgeon: Prentice LELON Pagan, MD;  Location: [facility];  Service: Orthopedics;  Laterality: Left;   TIBIA IM NAIL INSERTION  07/31/2012   Procedure: INTRAMEDULLARY (IM) NAIL TIBIAL;  Surgeon: Rush Kit, MD;  Location: [facility];  Service: Orthopedics;  Laterality: Left;   UPPER GI ENDOSCOPY  07/25/2016    Allergies: No Known Allergies  Outpatient Meds: Current Facility-Administered Medications  Medication Dose Route Frequency Provider Last Rate Last Admin   0.9 %  sodium chloride  infusion   Intravenous Continuous Danis, Victory LITTIE III, MD          ___________________________________________________________________ Objective   Exam:  BP 97/71   Pulse 76   Temp 98.4 F  (36.9 C) (Temporal)   Resp 20   Ht 5' 5 (1.651 m)   Wt 68.9 kg   SpO2 99%   BMI 25.29 kg/m   CV: regular , S1/S2 Resp: clear to auscultation bilaterally, normal RR and effort noted GI: soft, no tenderness, with active bowel sounds.   Assessment: Alcohol -related cirrhosis   Plan:  EGD  The benefits and risks of the planned procedure(s) were described in detail with the patient or (when appropriate) their health care proxy.  Risks were outlined as including, but not limited to, bleeding, infection, perforation, adverse medication reaction leading to cardiac or pulmonary decompensation, pancreatitis (if ERCP).  The limitation of incomplete mucosal visualization was also discussed.  No guarantees or warranties were given.  The patient is appropriate for an endoscopic procedure in the ambulatory setting.   - Victory Legrand, MD

## 2024-05-21 ENCOUNTER — Encounter (HOSPITAL_COMMUNITY): Payer: Self-pay | Admitting: Gastroenterology

## 2024-05-31 ENCOUNTER — Ambulatory Visit: Admitting: Physical Therapy

## 2024-06-02 ENCOUNTER — Inpatient Hospital Stay (HOSPITAL_COMMUNITY)
Admission: RE | Admit: 2024-06-02 | Discharge: 2024-06-02 | Attending: Emergency Medicine | Admitting: Emergency Medicine

## 2024-06-02 ENCOUNTER — Encounter (HOSPITAL_COMMUNITY): Payer: Self-pay

## 2024-06-02 VITALS — BP 113/74 | HR 68 | Temp 98.0°F | Resp 17

## 2024-06-02 DIAGNOSIS — L03211 Cellulitis of face: Secondary | ICD-10-CM | POA: Diagnosis not present

## 2024-06-02 DIAGNOSIS — H9202 Otalgia, left ear: Secondary | ICD-10-CM

## 2024-06-02 MED ORDER — DOXYCYCLINE HYCLATE 100 MG PO CAPS: 100.0000 mg | ORAL_CAPSULE | Freq: Two times a day (BID) | ORAL | 0 refills | Status: DC

## 2024-06-02 NOTE — ED Triage Notes (Signed)
 Pt had pain in left ear for a week. Reports swelling to left side of face for 3 days. Tried OTC drops for ear pain that didn't help.
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
## 2024-06-02 NOTE — Discharge Instructions (Signed)
 Starting doxycycline  twice daily for 10 days for facial cellulitis coverage. You can take 500 to 1000 mg of Tylenol  every 6-8 hours as needed for ear pain.  Do not exceed 4000 mg in 1 day. If your swelling or pain become worse or you develop a fever, body aches, chills, or weakness please seek immediate medical treatment in the emergency department for further evaluation.

## 2024-06-04 ENCOUNTER — Ambulatory Visit: Admitting: Occupational Therapy

## 2024-06-04 DIAGNOSIS — M6281 Muscle weakness (generalized): Secondary | ICD-10-CM | POA: Diagnosis not present

## 2024-06-04 DIAGNOSIS — S43002D Unspecified subluxation of left shoulder joint, subsequent encounter: Secondary | ICD-10-CM

## 2024-06-04 DIAGNOSIS — R278 Other lack of coordination: Secondary | ICD-10-CM

## 2024-06-04 DIAGNOSIS — R29818 Other symptoms and signs involving the nervous system: Secondary | ICD-10-CM
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
## 2024-06-05 ENCOUNTER — Encounter: Payer: Self-pay | Admitting: Physical Therapy

## 2024-06-05 ENCOUNTER — Ambulatory Visit: Admitting: Physical Therapy

## 2024-06-05 DIAGNOSIS — R29818 Other symptoms and signs involving the nervous system: Secondary | ICD-10-CM

## 2024-06-05 DIAGNOSIS — M6281 Muscle weakness (generalized): Secondary | ICD-10-CM

## 2024-06-05 DIAGNOSIS — R2681 Unsteadiness on feet: Secondary | ICD-10-CM
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
## 2024-06-06 ENCOUNTER — Encounter: Attending: Physical Medicine & Rehabilitation | Admitting: Physical Medicine & Rehabilitation

## 2024-06-06 ENCOUNTER — Encounter: Payer: Self-pay | Admitting: Physical Medicine & Rehabilitation

## 2024-06-06 VITALS — BP 118/76 | HR 70 | Ht 65.0 in | Wt 159.6 lb

## 2024-06-06 DIAGNOSIS — G8110 Spastic hemiplegia affecting unspecified side: Secondary | ICD-10-CM | POA: Insufficient documentation

## 2024-06-06 MED ORDER — ONABOTULINUMTOXINA 100 UNITS IJ SOLR
100.0000 [IU] | Freq: Once | INTRAMUSCULAR | Status: AC
  Administered 2024-06-06: 100 [IU] via INTRAMUSCULAR

## 2024-06-06 MED ORDER — SODIUM CHLORIDE (PF) 0.9 % IJ SOLN
2.0000 mL | Freq: Once | INTRAMUSCULAR | Status: AC
  Administered 2024-06-06: 2 mL

## 2024-06-06 NOTE — Progress Notes (Signed)
 Botox Injection for spasticity using needle EMG guidance  Dilution: 50 Units/ml Indication: Severe spasticity which interferes with ADL,mobility and/or  hygiene and is unresponsive to medication management and other conservative care Informed consent was obtained after describing risks and benefits of the procedure with the patient. This includes bleeding, bruising, infection, excessive weakness, or medication side effects. A REMS form is on file and signed. Needle: 27g 1 needle electrode Number of units per muscle Pectoralis0 Biceps0 FCR0 FCU0 FDS0 FDP0 FPL0 Gastrosoleus50 Post tib 50 All injections were done after obtaining appropriate EMG activity and after negative drawback for blood. The patient tolerated the procedure well. Post procedure instructions were given. A followup appointment was made.   We discussed his return to work trial, he thought he can do his job primarily using his right hand his left hand still is not functional for work-related activities and in fact is unable to button or do any other fine motor with the left hand.  He states that his employer did not feel like he can perform the duties of his job and therefore refused him employment.  I do think he is permanently disabled from this and all other jobs that require the use of his left upper extremity.  He is also limited in his ambulation requiring a brace as well as spasticity injections and is still will not result in normal ambulation speed's of distances. I will see him back in 6 weeks to see how he is doing with the spasticity

## 2024-06-11 ENCOUNTER — Ambulatory Visit: Attending: Physician Assistant | Admitting: Occupational Therapy

## 2024-06-11 ENCOUNTER — Ambulatory Visit: Admitting: Physical Therapy

## 2024-06-11 DIAGNOSIS — M6281 Muscle weakness (generalized): Secondary | ICD-10-CM | POA: Insufficient documentation

## 2024-06-11 DIAGNOSIS — R41842 Visuospatial deficit: Secondary | ICD-10-CM | POA: Insufficient documentation

## 2024-06-11 DIAGNOSIS — R29818 Other symptoms and signs involving the nervous system: Secondary | ICD-10-CM | POA: Insufficient documentation

## 2024-06-11 DIAGNOSIS — R29898 Other symptoms and signs involving the musculoskeletal system: Secondary | ICD-10-CM | POA: Diagnosis present

## 2024-06-11 DIAGNOSIS — R278 Other lack of coordination: Secondary | ICD-10-CM | POA: Insufficient documentation

## 2024-06-11 DIAGNOSIS — S43002D Unspecified subluxation of left shoulder joint, subsequent encounter: Secondary | ICD-10-CM | POA: Insufficient documentation
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
## 2024-06-17 ENCOUNTER — Telehealth: Payer: Self-pay | Admitting: Neurology

## 2024-06-17 ENCOUNTER — Encounter: Payer: Self-pay | Admitting: Physical Medicine & Rehabilitation

## 2024-06-17 NOTE — Telephone Encounter (Signed)
 Dr. Onita- pt last saw you 04/11/24. Looks like he completed EEG 04/18/24. Can you review and send results? We can call.   He is currently scheduled for next f/u 08/29/24. Did you want to move this up?

## 2024-06-17 NOTE — Telephone Encounter (Signed)
 Inquiry of an earlier appointment

## 2024-06-17 NOTE — Telephone Encounter (Signed)
 Pt is asking for a call with results to EEG, pt also asked for an earlier appointment

## 2024-06-18 ENCOUNTER — Encounter: Attending: Physical Medicine & Rehabilitation | Admitting: Physical Medicine & Rehabilitation

## 2024-06-18 ENCOUNTER — Ambulatory Visit: Admitting: Occupational Therapy

## 2024-06-18 ENCOUNTER — Encounter: Payer: Self-pay | Admitting: Physical Medicine & Rehabilitation

## 2024-06-18 VITALS — BP 109/67 | HR 73 | Ht 65.0 in | Wt 161.0 lb

## 2024-06-18 DIAGNOSIS — M6281 Muscle weakness (generalized): Secondary | ICD-10-CM | POA: Diagnosis not present

## 2024-06-18 DIAGNOSIS — R29818 Other symptoms and signs involving the nervous system: Secondary | ICD-10-CM

## 2024-06-18 DIAGNOSIS — M25572 Pain in left ankle and joints of left foot: Secondary | ICD-10-CM | POA: Diagnosis present

## 2024-06-18 DIAGNOSIS — R41842 Visuospatial deficit: Secondary | ICD-10-CM

## 2024-06-18 DIAGNOSIS — M25562 Pain in left knee: Secondary | ICD-10-CM | POA: Insufficient documentation

## 2024-06-18 DIAGNOSIS — R278 Other lack of coordination: Secondary | ICD-10-CM

## 2024-06-18 DIAGNOSIS — S43002D Unspecified subluxation of left shoulder joint, subsequent encounter: Secondary | ICD-10-CM

## 2024-06-18 DIAGNOSIS — R29898 Other symptoms and signs involving the musculoskeletal system: Secondary | ICD-10-CM

## 2024-06-18 MED ORDER — KETOROLAC TROMETHAMINE 60 MG/2ML IM SOLN
30.0000 mg | Freq: Once | INTRAMUSCULAR | Status: AC
  Administered 2024-06-18: 30 mg via INTRAMUSCULAR
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
## 2024-06-18 NOTE — Progress Notes (Signed)
 Subjective:    Patient ID: Troy Bishop, male    DOB: 03/09/1991, 33 y.o.   MRN: [id number] 33 y.o. male with history of uncontrolled hypertension, thrombocytopenia, cirrhosis of the liver due to alcohol  abuse, alcohol  withdrawal seizures who was admitted on 12/16/2023 with acute onset of left-sided weakness with lethargy.  UDS showed alcohol  level at 264 and he was found to have acute large right cerebral hematoma with active extravasation/#.  He was treated with 2 units FFP as repeat CT head showed increasing signs of hemorrhage.  Dr. Gillie recommended serial CT head for monitoring.  He was started on hypertonic saline for cerebral edema and Cleviprex  added for BP control.  He had issues with agitation due to alcohol  withdrawal requiring phenobarb taper as well as Precedex .  Follow-up MRI brain showed's stable hematoma centered in right basal ganglia extending to anterior right temporal lobe with extension of edema and 5 mm leftward shift.    Dr. Jerri felt that bleed likely hypertensive in nature.  Abnormal LFTs were being monitored and thrombocytopenia was stable.  He had reports of right hip pain with x-rays done which were negative for fracture or contusion.  Left upper extremity ultrasound done due to pain and showed superficial thrombosis of left cephalic vein.  Mentation and activity tolerance were improving however he required plus to mod to max assist for standing with max cues for posture as well as max to total assist for ADLs.  He was independent working prior to admission.  CIR was recommended due to functional decline.    Admit date: 12/30/2023 Discharge date: 01/30/2024 HPI Patient returns today he states that approximately 3 days after receiving Botox injection in the left calf he developed some pain in the left knee and left ankle.  He denies any falls or trauma to the area.  Review of chart did not show any ED visits. He has a prior history of left tib-fib fracture back in 2013.  He also had  a history of pelvic fractures in 2013. Patient has not had fever or chills. To outpatient OT on 11//2025 he has been working on coordination and has been showing some improvement with block stacking but not with grip strength Pain Inventory Average Pain 10 Pain Right Now 8 My pain is sharp  In the last 24 hours, has pain interfered with the following? General activity 10 Relation with others 10 Enjoyment of life 10 What TIME of day is your pain at its worst? morning , daytime, evening, and night Sleep (in general) NA  Pain is worse with: walking and standing Pain improves with: rest Relief from Meds: 10  Family History  Problem Relation Age of Onset   Hypertension Mother    Hypertension Father    Hypertension Maternal Grandfather    Esophageal cancer Neg Hx    Liver disease Neg Hx    Colon cancer Neg Hx    Social History   Socioeconomic History   Marital status: Married    Spouse name: Not on file   Number of children: 4   Years of education: Not on file   Highest education level: Not on file  Occupational History   Not on file  Tobacco Use   Smoking status: Former    Current packs/day: 0.00    Types: Cigarettes    Quit date: 05/13/2014    Years since quitting: 10.1   Smokeless tobacco: Former  Building Services Engineer status: Never Used  Substance and Sexual Activity  Alcohol  use: Yes    Comment: occasionally   Drug use: No   Sexual activity: Not Currently  Other Topics Concern   Not on file  Social History Narrative   ** Merged History Encounter **       Social Drivers of Health   Financial Resource Strain: Low Risk  (02/26/2024)   Overall Financial Resource Strain (CARDIA)    Difficulty of Paying Living Expenses: Not hard at all  Food Insecurity: No Food Insecurity (04/11/2024)   Hunger Vital Sign    Worried About Running Out of Food in the Last Year: Never true    Ran Out of Food in the Last Year: Never true  Transportation Needs: No Transportation Needs  (04/11/2024)   PRAPARE - Administrator, Civil Service (Medical): No    Lack of Transportation (Non-Medical): No  Physical Activity: Inactive (02/26/2024)   Exercise Vital Sign    Days of Exercise per Week: 0 days    Minutes of Exercise per Session: 0 min  Stress: No Stress Concern Present (02/26/2024)   [employer] of Occupational Health - Occupational Stress Questionnaire    Feeling of Stress: Only a little  Social Connections: Moderately Integrated (02/26/2024)   Social Connection and Isolation Panel    Frequency of Communication with Friends and Family: More than three times a week    Frequency of Social Gatherings with Friends and Family: Once a week    Attends Religious Services: More than 4 times per year    Active Member of [employer] or Organizations: No    Attends Banker Meetings: Never    Marital Status: Married   Past Surgical History:  Procedure Laterality Date   BREATH TEK H PYLORI N/A 09/08/2016   Procedure: BREATH TEK VEAR LORA;  Surgeon: Victory LITTIE Brand III, MD;  Location: [facility];  Service: Gastroenterology;  Laterality: N/A;   ESOPHAGOGASTRODUODENOSCOPY N/A 05/20/2024   Procedure: EGD (ESOPHAGOGASTRODUODENOSCOPY);  Surgeon: Brand Victory LITTIE DOUGLAS, MD;  Location: [facility];  Service: Gastroenterology;  Laterality: N/A;   HARDWARE REMOVAL Left 07/27/2017   Procedure: Removal of deep implants right proximal and distal tibia;  Surgeon: Kit Rush, MD;  Location: [facility];  Service: Orthopedics;  Laterality: Left;   TENDON REPAIR Left 08/14/2014   Procedure: LEFT HAND WOUND EXPLORATION AND TENDON REPAIR;  Surgeon: Prentice LELON Pagan, MD;  Location: [facility] [place];  Service: Orthopedics;  Laterality: Left;   TIBIA IM NAIL INSERTION  07/31/2012   Procedure: INTRAMEDULLARY (IM) NAIL TIBIAL;  Surgeon: Rush Kit, MD;  Location: [facility];  Service: Orthopedics;  Laterality: Left;   UPPER GI ENDOSCOPY  07/25/2016   Past Surgical  History:  Procedure Laterality Date   BREATH TEK H PYLORI N/A 09/08/2016   Procedure: BREATH TEK VEAR LORA;  Surgeon: Victory LITTIE Brand DOUGLAS, MD;  Location: [facility];  Service: Gastroenterology;  Laterality: N/A;   ESOPHAGOGASTRODUODENOSCOPY N/A 05/20/2024   Procedure: EGD (ESOPHAGOGASTRODUODENOSCOPY);  Surgeon: Brand Victory LITTIE DOUGLAS, MD;  Location: [facility];  Service: Gastroenterology;  Laterality: N/A;   HARDWARE REMOVAL Left 07/27/2017   Procedure: Removal of deep implants right proximal and distal tibia;  Surgeon: Kit Rush, MD;  Location: [facility];  Service: Orthopedics;  Laterality: Left;   TENDON REPAIR Left 08/14/2014   Procedure: LEFT HAND WOUND EXPLORATION AND TENDON REPAIR;  Surgeon: Prentice LELON Pagan, MD;  Location: [facility] [place];  Service: Orthopedics;  Laterality: Left;   TIBIA IM NAIL  INSERTION  07/31/2012   Procedure: INTRAMEDULLARY (IM) NAIL TIBIAL;  Surgeon: Norleen Armor, MD;  Location: [facility];  Service: Orthopedics;  Laterality: Left;   UPPER GI ENDOSCOPY  07/25/2016   Past Medical History:  Diagnosis Date   Hyperlipidemia    Hypertension    Liver disease    Painful orthopaedic hardware 07/2017   right tibia   Seizures (HCC)    Stroke (cerebrum) (HCC)    Thrombocytopenia    Verruca vulgaris 2023   penile mass   BP 109/67   Pulse 73   Ht 5' 5 (1.651 m)   Wt 161 lb (73 kg)   SpO2 97%   BMI 26.79 kg/m   Opioid Risk Score:   Fall Risk Score:  `1  Depression screen [facility] 2/9     06/18/2024    3:13 PM 06/06/2024   11:18 AM 04/11/2024    2:03 PM 03/25/2024    9:39 AM 02/26/2024    1:34 PM 02/26/2024    1:33 PM 09/28/2017    4:24 PM  Depression screen PHQ 2/9  Decreased Interest 0 0 0 0 0 0 0  Down, Depressed, Hopeless 0 0 0 0 0 0 0  PHQ - 2 Score 0 0 0 0 0 0 0  Altered sleeping    3 3    Tired, decreased energy    3 3    Change in appetite    2 2    Feeling bad or failure about yourself     0 0    Trouble concentrating    0 0     Moving slowly or fidgety/restless    0 0    Suicidal thoughts    0 0    PHQ-9 Score    8  8     Difficult doing work/chores    Somewhat difficult Somewhat difficult       Data saved with a previous flowsheet row definition     Review of Systems  HENT:         Points to left face and possible visual changes  Musculoskeletal:  Positive for gait problem.       Left hip and leg  All other systems reviewed and are negative.      Objective:   Physical Exam General No acute distress Mood affect appropriate Speech with dysarthria also limited by language.  Wife is able to assist with English. The patient is able ambulate with a cane as well as a left AFO.  He is some plantar flexion at the left ankle extension at the knee but does not look like hyperextension. Left knee shows no evidence of effusion There is no evidence of tenderness in the left calf or swelling of the left calf no redness no pretibial edema.  Left ankle has no pain with range of motion he does have some limited ankle dorsiflexion due to increased plantarflexion tone he has about 2 beats of clonus at the ankle.  No foot inversion during ambulation no foot inversion spasticity.        Assessment & Plan:  1.  Left knee and left ankle joint pain which she noted about 3 days after botulinum toxin injection.  Botox has onset of action in 7 days and also should not cause pain.  Also his pain is not in the region where the Botox was injected which was the gastrosoleus as well as the posterior tibialis region. He has negative examination.  He does have a  prior history of tib-fib fracture in the past and could have some posttraumatic arthritis which combined with his chronic gait deviation poststroke become a pain generator. The patient has some gabapentin  for neurogenic pain this is clot not clearly neurogenic pain however if knee and ankle x-rays are negative may consider increasing gabapentin .  Will give Toradol  30 mg IM  today. Will hold off on Botox reinjection to see how foot inversion and ankle plantarflexion look like off the Botox. Will see him back in approximately 3 months.  Will review x-rays when available

## 2024-06-18 NOTE — Telephone Encounter (Signed)
 I called patient, EEG showed moderate background slowing,  He is tolerating Depakote  ER, no recurrent seizure, advised him compliant with this medication, keep previously follow-up appointment in January 2026   CONCLUSION: This is an abnormal EEG.  There is electrodiagnostic evidence of moderate background slowing consistent with moderate bihemispheric malfunction.  There was no evidence of epileptiform discharge.

## 2024-06-25 ENCOUNTER — Encounter: Payer: Self-pay | Admitting: Family Medicine

## 2024-06-25 ENCOUNTER — Ambulatory Visit: Admitting: Family Medicine

## 2024-06-25 VITALS — BP 128/86 | HR 86 | Temp 98.2°F | Ht 65.0 in | Wt 160.0 lb

## 2024-06-25 DIAGNOSIS — E7800 Pure hypercholesterolemia, unspecified: Secondary | ICD-10-CM | POA: Insufficient documentation

## 2024-06-25 DIAGNOSIS — H538 Other visual disturbances: Secondary | ICD-10-CM

## 2024-06-25 DIAGNOSIS — F411 Generalized anxiety disorder: Secondary | ICD-10-CM

## 2024-06-25 DIAGNOSIS — E663 Overweight: Secondary | ICD-10-CM

## 2024-06-25 DIAGNOSIS — K219 Gastro-esophageal reflux disease without esophagitis: Secondary | ICD-10-CM | POA: Insufficient documentation

## 2024-06-25 DIAGNOSIS — D696 Thrombocytopenia, unspecified: Secondary | ICD-10-CM

## 2024-06-25 DIAGNOSIS — I10 Essential (primary) hypertension: Secondary | ICD-10-CM

## 2024-06-25 DIAGNOSIS — F1010 Alcohol abuse, uncomplicated: Secondary | ICD-10-CM | POA: Insufficient documentation

## 2024-06-25 NOTE — Assessment & Plan Note (Signed)
 Continue to work on decreasing alcohol  intake and stop drinking.

## 2024-06-25 NOTE — Progress Notes (Signed)
 Established Patient Office Visit   Subjective:  Patient ID: Troy Bishop, male    DOB: [birth year]  Age: 33 y.o. MRN: [id number]  Chief Complaint  Patient presents with   Medical Management of Chronic Issues    3 month follow up     HPI Patient is present for a 3 month chronic management with spouse present. Hard to understand patient with his dysarthria from stroke and limited by language. Spouse is assisting.    HTN: Chronic. Patient is taking Amlodipine  10mg  daily, Carvedilol  25mg  daily, and Losartan  100mg  daily. Denies monitoring his blood pressure at home. Denies CP, SHOB, HA, dizziness, or lightheadedness. Intermittent lower extremity edema in left lower. None in the right lower extremity edema.  BP Readings from Last 3 Encounters:  06/25/24 128/86  06/18/24 109/67  06/06/24 118/76    Hyperlipidemia: Chronic. Patient is taking Atorvastatin  40mg  daily. Lab Results  Component Value Date   CHOL 247 (H) 12/17/2023   HDL 78 12/17/2023   LDLCALC 124 (H) 12/17/2023   TRIG 224 (H) 12/17/2023   CHOLHDL 3.2 12/17/2023     Still taking Folic Acid  1mg  daily. Decreased his alcohol  intake. Last drink was 2 days ago.   Anxiety: Chronic. Patient is taking Hydroxyzine  25mg  tablet TID PRN. Usually takes it twice a week.   GERD:Chronic. Patient is taking Pantoprazole  40mg  daily. Effective.   ROS See HPI above     Objective:   BP 128/86   Pulse 86   Temp 98.2 F (36.8 C) (Oral)   Ht 5' 5 (1.651 m)   Wt 160 lb (72.6 kg)   SpO2 98%   BMI 26.63 kg/m    Physical Exam Vitals reviewed.  Constitutional:      General: He is not in acute distress.    Appearance: Normal appearance. He is not ill-appearing, toxic-appearing or diaphoretic.  Eyes:     General:        Right eye: No discharge.        Left eye: No discharge.     Conjunctiva/sclera: Conjunctivae normal.  Cardiovascular:     Rate and Rhythm: Normal rate and regular rhythm.     Heart sounds: Normal heart sounds. No  murmur heard.    No friction rub. No gallop.  Pulmonary:     Effort: Pulmonary effort is normal. No respiratory distress.     Breath sounds: Normal breath sounds.  Musculoskeletal:        General: Normal range of motion.     Comments: Wearing brace on left lower leg  Skin:    General: Skin is warm and dry.  Neurological:     General: No focal deficit present.     Mental Status: He is alert and oriented to person, place, and time. Mental status is at baseline.     Gait: Gait abnormal.  Psychiatric:        Mood and Affect: Mood normal.        Behavior: Behavior normal.        Thought Content: Thought content normal.        Judgment: Judgment normal.      Assessment & Plan:  Essential hypertension Assessment & Plan: Stable. Continue Amlodipine  10mg  daily, Carvedilol  25mg  daily, and Losartan  100mg  daily. Ordered CMP.   Orders: -     Comprehensive metabolic panel with GFR; Future  High cholesterol Assessment & Plan: Not controlled. Continue Atorvastatin  40mg  daily. Ordered lipid panel and CMP. Schedule lab appointment since he is not fasting.  Orders: -     Comprehensive metabolic panel with GFR; Future -     Lipid panel; Future  Overweight (BMI 25.0-29.9) -     CBC with Differential/Platelet; Future -     TSH; Future  Blurry vision -     Ambulatory referral to Ophthalmology  Alcohol  abuse Assessment & Plan: Continue to work on decreasing alcohol  intake and stop drinking.    Anxiety state Assessment & Plan: Controlled. Scored 5 on GAD-7 and 2 on PHQ-9. Continue Hydroxyzine  25mg  TID PRN.    Gastroesophageal reflux disease without esophagitis Assessment & Plan: Stable. Continue Pantoprazole  40mg  daily. Since last visit-had an upper endoscopy with no significant findings.    Thrombocytopenia -     CBC with Differential/Platelet; Future  -Ordered labs. Schedule a lab appointment when able to fast. Office will call with results. -Placed a referral to  ophthalmology for eye exam due to blurry vision. Please call the office or send a MyChart message if you do not receive a phone call or a MyChart message about appointment in 2 weeks.   Return in about 3 months (around 09/25/2024) for physical; Lab appointment- fasting .   Alba Perillo, NP

## 2024-06-25 NOTE — Assessment & Plan Note (Signed)
 Controlled. Scored 5 on GAD-7 and 2 on PHQ-9. Continue Hydroxyzine  25mg  TID PRN.

## 2024-06-25 NOTE — Patient Instructions (Signed)
-  It was good to see you today.  -Continue all medications.  -Ordered labs. Schedule a lab appointment when able to fast. Office will call with results. -Placed a referral to ophthalmology for eye exam due to blurry vision. Please call the office or send a MyChart message if you do not receive a phone call or a MyChart message about appointment in 2 weeks.  -Follow up in 3 months for a physical.

## 2024-06-25 NOTE — Assessment & Plan Note (Signed)
 Stable. Continue Amlodipine  10mg  daily, Carvedilol  25mg  daily, and Losartan  100mg  daily. Ordered CMP.

## 2024-06-25 NOTE — Assessment & Plan Note (Signed)
 Not controlled. Continue Atorvastatin  40mg  daily. Ordered lipid panel and CMP. Schedule lab appointment since he is not fasting.

## 2024-06-25 NOTE — Assessment & Plan Note (Signed)
 Stable. Continue Pantoprazole  40mg  daily. Since last visit-had an upper endoscopy with no significant findings.

## 2024-06-27 ENCOUNTER — Other Ambulatory Visit: Payer: Self-pay | Admitting: Physical Medicine & Rehabilitation

## 2024-06-27 ENCOUNTER — Other Ambulatory Visit

## 2024-07-01 ENCOUNTER — Other Ambulatory Visit

## 2024-07-01 ENCOUNTER — Ambulatory Visit
Admission: RE | Admit: 2024-07-01 | Discharge: 2024-07-01 | Disposition: A | Discharge status: Home / Self Care | Source: Ambulatory Visit | Attending: Physical Medicine & Rehabilitation | Admitting: Physical Medicine & Rehabilitation

## 2024-07-01 DIAGNOSIS — I10 Essential (primary) hypertension: Secondary | ICD-10-CM | POA: Diagnosis not present

## 2024-07-01 DIAGNOSIS — M25562 Pain in left knee: Secondary | ICD-10-CM

## 2024-07-01 DIAGNOSIS — E7800 Pure hypercholesterolemia, unspecified: Secondary | ICD-10-CM | POA: Diagnosis not present

## 2024-07-01 DIAGNOSIS — D696 Thrombocytopenia, unspecified: Secondary | ICD-10-CM | POA: Diagnosis not present

## 2024-07-01 DIAGNOSIS — E663 Overweight: Secondary | ICD-10-CM

## 2024-07-01 LAB — CBC WITH DIFFERENTIAL/PLATELET
Basophils Absolute: 0 K/uL (ref 0.0–0.1)
Basophils Relative: 0.5 % (ref 0.0–3.0)
Eosinophils Absolute: 0.3 K/uL (ref 0.0–0.7)
Eosinophils Relative: 6.8 % — ABNORMAL HIGH (ref 0.0–5.0)
HCT: 39.1 % (ref 39.0–52.0)
Hemoglobin: 13 g/dL (ref 13.0–17.0)
Lymphocytes Relative: 29.2 % (ref 12.0–46.0)
Lymphs Abs: 1.3 K/uL (ref 0.7–4.0)
MCHC: 33.1 g/dL (ref 30.0–36.0)
MCV: 91.4 fl (ref 78.0–100.0)
Monocytes Absolute: 0.4 K/uL (ref 0.1–1.0)
Monocytes Relative: 9.8 % (ref 3.0–12.0)
Neutro Abs: 2.3 K/uL (ref 1.4–7.7)
Neutrophils Relative %: 53.7 % (ref 43.0–77.0)
Platelets: 121 K/uL — ABNORMAL LOW (ref 150.0–400.0)
RBC: 4.28 Mil/uL (ref 4.22–5.81)
RDW: 14.9 % (ref 11.5–15.5)
WBC: 4.3 K/uL (ref 4.0–10.5)

## 2024-07-01 LAB — COMPREHENSIVE METABOLIC PANEL WITH GFR
ALT: 21 U/L (ref 0–53)
AST: 21 U/L (ref 0–37)
Albumin: 4.7 g/dL (ref 3.5–5.2)
Alkaline Phosphatase: 71 U/L (ref 39–117)
BUN: 8 mg/dL (ref 6–23)
CO2: 31 meq/L (ref 19–32)
Calcium: 10 mg/dL (ref 8.4–10.5)
Chloride: 102 meq/L (ref 96–112)
Creatinine, Ser: 0.78 mg/dL (ref 0.40–1.50)
GFR: 116.04 mL/min (ref >60.00)
Glucose, Bld: 86 mg/dL (ref 70–99)
Potassium: 3.9 meq/L (ref 3.5–5.1)
Sodium: 140 meq/L (ref 135–145)
Total Bilirubin: 0.8 mg/dL (ref 0.2–1.2)
Total Protein: 8.6 g/dL — ABNORMAL HIGH (ref 6.0–8.3)

## 2024-07-01 LAB — LIPID PANEL
Cholesterol: 197 mg/dL (ref 0–200)
HDL: 67 mg/dL (ref >39.00)
LDL Cholesterol: 111 mg/dL — ABNORMAL HIGH (ref 0–99)
NonHDL: 130.48
Total CHOL/HDL Ratio: 3
Triglycerides: 95 mg/dL (ref 0.0–149.0)
VLDL: 19 mg/dL (ref 0.0–40.0)

## 2024-07-01 LAB — TSH: TSH: 1.88 u[IU]/mL (ref 0.35–5.50)

## 2024-07-02 ENCOUNTER — Ambulatory Visit: Payer: Self-pay | Admitting: Family Medicine

## 2024-07-02 DIAGNOSIS — E785 Hyperlipidemia, unspecified: Secondary | ICD-10-CM

## 2024-07-02 MED ORDER — ATORVASTATIN CALCIUM 80 MG PO TABS: 80.0000 mg | ORAL_TABLET | Freq: Every day | ORAL | 3 refills | Status: AC

## 2024-07-03 ENCOUNTER — Ambulatory Visit (HOSPITAL_COMMUNITY)
Admission: RE | Admit: 2024-07-03 | Discharge: 2024-07-03 | Disposition: A | Discharge status: Home / Self Care | Source: Ambulatory Visit | Attending: Nurse Practitioner | Admitting: Nurse Practitioner

## 2024-07-03 ENCOUNTER — Encounter (HOSPITAL_COMMUNITY): Payer: Self-pay

## 2024-07-03 VITALS — BP 109/75 | HR 74 | Temp 98.9°F | Resp 14

## 2024-07-03 DIAGNOSIS — S0502XA Injury of conjunctiva and corneal abrasion without foreign body, left eye, initial encounter: Secondary | ICD-10-CM | POA: Diagnosis not present

## 2024-07-03 MED ORDER — ERYTHROMYCIN 5 MG/GM OP OINT: TOPICAL_OINTMENT | Freq: Four times a day (QID) | OPHTHALMIC | 0 refills | Status: AC

## 2024-07-03 MED ORDER — EYE WASH OP SOLN
OPHTHALMIC | Status: AC
  Filled 2024-07-03: qty 118

## 2024-07-03 MED ORDER — TETRACAINE HCL 0.5 % OP SOLN
OPHTHALMIC | Status: AC
  Filled 2024-07-03: qty 4

## 2024-07-03 MED ORDER — FLUORESCEIN SODIUM 1 MG OP STRP
ORAL_STRIP | OPHTHALMIC | Status: AC
  Filled 2024-07-03: qty 1
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
## 2024-07-03 NOTE — Discharge Instructions (Signed)
 You have an abrasion on your cornea.  Use the erythromycin  ointment 4 times daily for 7 days to treat it.  Recommend follow-up with ophthalmology symptoms do not improve with treatment or if symptoms worsen despite treatment.

## 2024-07-03 NOTE — ED Triage Notes (Signed)
 Patient reports that he has had bilateral redness, drainage, and itching  L>R x 4 days.  Patient states he has been using eye gtts given to him for itching.

## 2024-07-08 ENCOUNTER — Telehealth: Payer: Self-pay | Admitting: Family Medicine

## 2024-07-08 NOTE — Telephone Encounter (Unsigned)
 Copied from CRM [id number]. Topic: Clinical - Medication Refill >> Jul 08, 2024  2:17 PM Brittany M wrote: Medication: atorvastatin  (LIPITOR) 80 MG tablet ;  losartan  (COZAAR ) 100 MG tablet  Has the patient contacted their pharmacy? Yes (Agent: If no, request that the patient contact the pharmacy for the refill. If patient does not wish to contact the pharmacy document the reason why and proceed with request.) (Agent: If yes, when and what did the pharmacy advise?)  This is the patient's preferred pharmacy:  [id number] [address], [place] - [address] AT [facility] [address] [facility] [address] [place] [address] Phone: [phone] Fax: [phone]   Is this the correct pharmacy for this prescription? Yes If no, delete pharmacy and type the correct one.   Has the prescription been filled recently? Yes  Is the patient out of the medication? Yes  Has the patient been seen for an appointment in the last year OR does the patient have an upcoming appointment? Yes  Can we respond through MyChart? Yes  Agent: Please be advised that Rx refills may take up to 3 business days. We ask that you follow-up with your pharmacy.

## 2024-07-09 MED ORDER — AMLODIPINE BESYLATE 10 MG PO TABS: 10.0000 mg | ORAL_TABLET | Freq: Every day | ORAL | 1 refills | Status: AC

## 2024-07-09 MED ORDER — LOSARTAN POTASSIUM 100 MG PO TABS: 100.0000 mg | ORAL_TABLET | Freq: Every day | ORAL | 1 refills | Status: AC

## 2024-07-12 ENCOUNTER — Ambulatory Visit: Admitting: Family Medicine

## 2024-07-15 ENCOUNTER — Telehealth: Payer: Self-pay

## 2024-07-15 NOTE — Telephone Encounter (Signed)
 Copied from CRM #[id number]. Topic: General - Other >> Jul 15, 2024  4:07 PM Alfonso HERO wrote: Reason for CRM: Nena fearing coord for [facility] calling requesting last visit note be sent to her via fax [phone]. She sent a fax requesting it a while ago and never rcvd it.

## 2024-07-16 NOTE — Telephone Encounter (Signed)
 Noted printed and faxed.

## 2024-07-17 ENCOUNTER — Telehealth: Payer: Self-pay | Admitting: Family Medicine

## 2024-07-17 NOTE — Telephone Encounter (Signed)
 Copied from CRM #[id number]. Topic: Referral - Question >> Jul 17, 2024  9:41 AM Deaijah H wrote: Reason for CRM: Patients wife called wanting a referral to a different eye doctor due to previous one being booked until August.

## 2024-07-18 ENCOUNTER — Encounter: Admitting: Physical Medicine & Rehabilitation

## 2024-07-26 ENCOUNTER — Other Ambulatory Visit: Payer: Self-pay

## 2024-07-26 DIAGNOSIS — H538 Other visual disturbances: Secondary | ICD-10-CM

## 2024-07-26 NOTE — Telephone Encounter (Signed)
 Pt would like update and I let agent know I would ask referral coordinator to give pt call

## 2024-07-29 ENCOUNTER — Telehealth: Payer: Self-pay | Admitting: *Deleted

## 2024-07-29 NOTE — Telephone Encounter (Signed)
 Copied from CRM #[id number]. Topic: Referral - Question >> Jul 29, 2024 10:46 AM Farrel B wrote: Reason for CRM: received a call from [facility] care ms. Reynolds [phone], she stated that they'd received the referral in regards to the pt with blurry vision however, the demographics does not appear to be on the referral. I called the [email or web address]  to seek assistance.

## 2024-07-29 NOTE — Telephone Encounter (Signed)
 Faxed demographics to [facility] eye

## 2024-07-30 ENCOUNTER — Telehealth: Payer: Self-pay | Admitting: Family Medicine

## 2024-07-30 NOTE — Telephone Encounter (Signed)
 Copied from CRM #[id number]. Topic: Referral - Status >> Jul 30, 2024  9:09 AM Franky GRADE wrote: Reason for CRM: [facility] is calling to advise they received the referral for the patient; however, insurance is out of network with their practice.

## 2024-08-05 NOTE — Telephone Encounter (Unsigned)
 Copied from CRM #[id number]. Topic: Referral - Status >> Aug 05, 2024 11:14 AM Mesmerise C wrote: Reason for CRM: Patient stated he received a call back nothing in the communication notes also inquired about referral for eye doctor advised of insurance being out of network with [facility] patient looking to get an update where it's being sent to

## 2024-08-05 NOTE — Telephone Encounter (Signed)
 Spoke to pt and gave pt the phone number to call and schedule an appointment.

## 2024-08-10 ENCOUNTER — Emergency Department (HOSPITAL_COMMUNITY): Admission: EM | Admit: 2024-08-10 | Discharge: 2024-08-11 | Disposition: A

## 2024-08-10 DIAGNOSIS — H1033 Unspecified acute conjunctivitis, bilateral: Secondary | ICD-10-CM | POA: Diagnosis not present

## 2024-08-10 DIAGNOSIS — Z8673 Personal history of transient ischemic attack (TIA), and cerebral infarction without residual deficits: Secondary | ICD-10-CM | POA: Insufficient documentation

## 2024-08-10 DIAGNOSIS — X58XXXA Exposure to other specified factors, initial encounter: Secondary | ICD-10-CM | POA: Diagnosis not present

## 2024-08-10 DIAGNOSIS — S0501XA Injury of conjunctiva and corneal abrasion without foreign body, right eye, initial encounter: Secondary | ICD-10-CM | POA: Insufficient documentation

## 2024-08-10 DIAGNOSIS — H5711 Ocular pain, right eye: Secondary | ICD-10-CM | POA: Diagnosis present

## 2024-08-11 ENCOUNTER — Encounter (HOSPITAL_COMMUNITY): Payer: Self-pay | Admitting: *Deleted

## 2024-08-11 ENCOUNTER — Other Ambulatory Visit: Payer: Self-pay

## 2024-08-11 MED ORDER — FLUORESCEIN SODIUM 1 MG OP STRP
1.0000 | ORAL_STRIP | Freq: Once | OPHTHALMIC | Status: AC
  Administered 2024-08-11: 1 via OPHTHALMIC
  Filled 2024-08-11: qty 1

## 2024-08-11 MED ORDER — TETRACAINE HCL 0.5 % OP SOLN
2.0000 [drp] | Freq: Once | OPHTHALMIC | Status: AC
  Administered 2024-08-11: 2 [drp] via OPHTHALMIC
  Filled 2024-08-11: qty 4

## 2024-08-11 MED ORDER — CIPROFLOXACIN HCL 0.3 % OP OINT: TOPICAL_OINTMENT | Freq: Two times a day (BID) | OPHTHALMIC | 0 refills | Status: AC

## 2024-08-11 NOTE — ED Provider Triage Note (Signed)
 Emergency Medicine Provider Triage Evaluation Note  Mousa Prout , a 34 y.o. male  was evaluated in triage.  Pt complains of bilateral eye pain and irritation since yesterday. Dose not where corrective vision  Review of Systems  Positive: Eye pain, irritations, blurry vision Negative: fever  Physical Exam  BP 111/81 (BP Location: Right Arm)   Pulse 65   Temp 98.3 F (36.8 C)   Resp 17   Ht 5' 5 (1.651 m)   Wt 72.6 kg   SpO2 100%   BMI 26.63 kg/m  Gen:   Awake, no distress   Resp:  Normal effort  MSK:   Moves extremities without difficulty  Other:  PERRL, conjunctival injection bilaterally  Medical Decision Making  Medically screening exam initiated at 2:40 AM.  Appropriate orders placed.  Kervens Drahos was informed that the remainder of the evaluation will be completed by another provider, this initial triage assessment does not replace that evaluation, and the importance of remaining in the ED until their evaluation is complete.     Griselda Norris, MD 08/11/24 [phone]

## 2024-08-11 NOTE — ED Notes (Signed)
 Awaiting patient from lobby.

## 2024-08-11 NOTE — ED Provider Notes (Signed)
 " [facility] Provider Note   CSN: [id number] Arrival date & time: 08/10/24  2300     Patient presents with: Eye Pain   Troy Bishop is a 34 y.o. male who has primary concerns of bilateral eye pain.  I am review of previous medical notes, he has been seen previously for corneal abrasion, as well as chronic dry eye after having had a stroke and has been referred to ophthalmology however has not followed up with the same.  He has previously used erythromycin  eye ointment with minimal improvement in his condition.  Continues to complain of increased tearing bilaterally with blurred vision bilaterally.  Also has a sensation of foreign body in the eye.  This was diagnosed at urgent care on 03 July 2024.  Denies any purulent discharge from the eye.  Review of previous medical diagnoses shows diagnosis of thrombocytopenia, previous TBI, previous CVA with left-sided hemiparesis, and alcoholic cirrhosis of the liver without ascites.    Eye Pain       Prior to Admission medications  Medication Sig Start Date End Date Taking? Authorizing Provider  amLODipine  (NORVASC ) 10 MG tablet Take 1 tablet (10 mg total) by mouth daily. 07/09/24   Williamson, Joanna R, NP  artificial tears ophthalmic solution Place 1 drop into both eyes as needed for dry eyes. 01/29/24   Angiulli, Toribio PARAS, PA-C  atorvastatin  (LIPITOR) 80 MG tablet Take 1 tablet (80 mg total) by mouth daily. 07/02/24   [name], NP  Baclofen  5 MG TABS TAKE 2 TABLETS(10 MG) BY MOUTH TWICE DAILY 07/08/24   Kirsteins, Prentice BRAVO, MD  carvedilol  (COREG ) 25 MG tablet TAKE 1 TABLET(25 MG) BY MOUTH TWICE DAILY WITH A MEAL 03/29/24   [name], NP  ciprofloxacin  (CILOXAN ) 0.3 % ophthalmic ointment Place into both eyes 2 (two) times daily for 5 days. 08/11/24 08/16/24 Yes Myriam Dorn BROCKS, PA  divalproex  (DEPAKOTE  ER) 500 MG 24 hr tablet Take 1 tablet (500 mg total) by mouth at bedtime. 04/11/24    Onita Duos, MD  folic acid  (FOLVITE ) 1 MG tablet Take 1 tablet (1 mg total) by mouth daily. 03/25/24   [name], NP  gabapentin  (NEURONTIN ) 300 MG capsule Take 1 capsule (300 mg total) by mouth 2 (two) times daily. 05/07/24   Kirsteins, Prentice BRAVO, MD  hydrOXYzine  (VISTARIL ) 25 MG capsule Take 1 capsule (25 mg total) by mouth every 8 (eight) hours as needed for itching. 02/26/24   [name], NP  losartan  (COZAAR ) 100 MG tablet Take 1 tablet (100 mg total) by mouth daily. 07/09/24   Williamson, Joanna R, NP  Multiple Vitamin (MULTIVITAMIN WITH MINERALS) TABS tablet Take 1 tablet by mouth daily. 03/06/24   [name], NP  naphazoline-glycerin  (CLEAR EYES REDNESS) 0.012-0.25 % SOLN Place 2 drops into the left eye 3 (three) times daily. 01/29/24   Angiulli, Toribio PARAS, PA-C  pantoprazole  (PROTONIX ) 40 MG tablet TAKE 1 TABLET(40 MG) BY MOUTH DAILY 03/06/24   Williamson, Joanna R, NP    Allergies: Patient has no known allergies.    Review of Systems  Eyes:  Positive for pain, discharge and redness.  All other systems reviewed and are negative.   Updated Vital Signs BP 113/78   Pulse 70   Temp 97.9 F (36.6 C) (Oral)   Resp 18   Ht 5' 5 (1.651 m)   Wt 72.6 kg   SpO2 100%   BMI 26.63 kg/m  Physical Exam Vitals and nursing note reviewed.  Constitutional:      General: He is not in acute distress.    Appearance: Normal appearance.  HENT:     Head: Normocephalic and atraumatic.     Mouth/Throat:     Mouth: Mucous membranes are moist.     Pharynx: Oropharynx is clear.  Eyes:     General: Lids are normal. Lids are everted, no foreign bodies appreciated. Vision grossly intact. Gaze aligned appropriately. No visual field deficit or scleral icterus.       Right eye: No foreign body, discharge or hordeolum.        Left eye: No foreign body, discharge or hordeolum.     Extraocular Movements: Extraocular movements intact.     Conjunctiva/sclera:     Right eye:  Right conjunctiva is injected. No chemosis.    Left eye: Left conjunctiva is injected. No chemosis.    Pupils: Pupils are equal, round, and reactive to light.     Right eye: Fluorescein  uptake present.     Comments: There is notable fluorescein  uptake on exam of the right eye, at approximately the 5 and 7 o'clock positions.  Cardiovascular:     Rate and Rhythm: Normal rate and regular rhythm.     Pulses: Normal pulses.     Heart sounds: Normal heart sounds. No murmur heard.    No friction rub. No gallop.  Pulmonary:     Effort: Pulmonary effort is normal.     Breath sounds: Normal breath sounds.  Abdominal:     General: Abdomen is flat. Bowel sounds are normal.     Palpations: Abdomen is soft.  Musculoskeletal:        General: Normal range of motion.     Cervical back: Normal range of motion and neck supple.     Right lower leg: No edema.     Left lower leg: No edema.  Skin:    General: Skin is warm and dry.     Capillary Refill: Capillary refill takes less than 2 seconds.  Neurological:     General: No focal deficit present.     Mental Status: He is alert. Mental status is at baseline.  Psychiatric:        Mood and Affect: Mood normal.     (all labs ordered are listed, but only abnormal results are displayed) Labs Reviewed - No data to display  EKG: None  Radiology: No results found.   Procedures   Medications Ordered in the ED  fluorescein  ophthalmic strip 1 strip (1 strip Both Eyes Given 08/11/24 0822)  tetracaine  (PONTOCAINE) 0.5 % ophthalmic solution 2 drop (2 drops Both Eyes Given 08/11/24 9167)                                    Medical Decision Making Risk Prescription drug management.   Based on this patient's presenting signs and symptoms along with the physical exam differential diagnosis includes potential corneal abrasion, bacterial conjunctivitis, allergic conjunctivitis, viral keratitis, keratitis sicca.  Eye exam does show bilateral conjunctival  injection, however no apparent chemosis.  Fluorescein  uptake is apparent in the right eye and there are noted punctate uptake of bilateral eyes on fluorescein  exam.  Visual acuity is decreased with the left being greater than the right, with right being 10/50, left being 10/80.  Patient does complain of dry being worse in the left compared to the  right and this is consistent with a physical exam.  Given this we will manage with ciprofloxacin  eye ointment as he has previously used erythromycin  with no improvement, also placed ambulatory urgent referral to ophthalmology given the potential for keratitis.  This was explained to the patient along with the importance of following up with ophthalmology within the next several days, he is clear that he is to contact the office if they have not followed up with him within the next 48 hours.  At this time plan is to discharge the patient the previously discussed outpatient follow-up with urgent ambulatory referral provided.     Final diagnoses:  Abrasion of right cornea, initial encounter  Acute conjunctivitis of both eyes, unspecified acute conjunctivitis type    ED Discharge Orders          Ordered    ciprofloxacin  (CILOXAN ) 0.3 % ophthalmic ointment  2 times daily        08/11/24 0846    Ambulatory referral to Ophthalmology        08/11/24 0846               Myriam Dorn BROCKS, PA 08/11/24 0859    Neysa Caron PARAS, DO 08/11/24 1046  "

## 2024-08-11 NOTE — Discharge Instructions (Signed)
 Is very important that you follow-up with ophthalmology within the next week, and a referral has been placed and they should be contacting you for an appointment however you do not hear from them in the next 2 days please reach out to the provided information for an appointment to be evaluated by an eye specialist.

## 2024-08-11 NOTE — ED Triage Notes (Signed)
 The pt has red irritated eyes for 2-3 days he started having episodes of this in may after a stroke

## 2024-08-13 ENCOUNTER — Telehealth: Payer: Self-pay | Admitting: *Deleted

## 2024-08-13 DIAGNOSIS — I639 Cerebral infarction, unspecified: Secondary | ICD-10-CM

## 2024-08-13 NOTE — Telephone Encounter (Signed)
 Copied from CRM [id number]. Topic: Referral - Request for Referral >> Aug 13, 2024 12:49 PM Carlyon D wrote: Did the patient discuss referral with their provider in the last year? Yes (If No - schedule appointment) (If Yes - send message)  Appointment offered? No  Type of order/referral and detailed reason for visit: therapy for hand and leg   Preference of office, provider, location: wants a location near him   If referral order, have you been seen by this specialty before? Yes (If Yes, this issue or another issue? When? Where?  Can we respond through MyChart? Yes

## 2024-08-15 ENCOUNTER — Telehealth: Payer: Self-pay | Admitting: Family Medicine

## 2024-08-15 ENCOUNTER — Other Ambulatory Visit: Payer: Self-pay

## 2024-08-15 DIAGNOSIS — I639 Cerebral infarction, unspecified: Secondary | ICD-10-CM

## 2024-08-15 NOTE — Telephone Encounter (Signed)
 Copied from CRM [id number]. Topic: Referral - Question >> Aug 15, 2024 10:41 AM Viola F wrote: Reason for CRM: Patient called to follow up on referral to physical therapy and has some questions. Please call him at  [phone]

## 2024-08-15 NOTE — Telephone Encounter (Signed)
 Called pt and sent referral in for a closer location

## 2024-08-16 ENCOUNTER — Other Ambulatory Visit: Payer: Self-pay

## 2024-08-16 DIAGNOSIS — I639 Cerebral infarction, unspecified: Secondary | ICD-10-CM

## 2024-08-16 NOTE — Telephone Encounter (Signed)
Referral been placed  

## 2024-08-16 NOTE — Telephone Encounter (Signed)
 Patient has called back requesting a referral for OT as well at the same location of his PT. Please call at [phone] .

## 2024-08-21 ENCOUNTER — Encounter: Payer: Self-pay | Admitting: Physical Therapy

## 2024-08-21 ENCOUNTER — Ambulatory Visit: Attending: Family Medicine | Admitting: Physical Therapy

## 2024-08-21 ENCOUNTER — Other Ambulatory Visit: Payer: Self-pay

## 2024-08-21 VITALS — BP 111/74 | HR 75

## 2024-08-21 DIAGNOSIS — R29818 Other symptoms and signs involving the nervous system: Secondary | ICD-10-CM | POA: Diagnosis present

## 2024-08-21 DIAGNOSIS — R2689 Other abnormalities of gait and mobility: Secondary | ICD-10-CM | POA: Diagnosis present

## 2024-08-21 DIAGNOSIS — I639 Cerebral infarction, unspecified: Secondary | ICD-10-CM | POA: Insufficient documentation

## 2024-08-21 DIAGNOSIS — R278 Other lack of coordination: Secondary | ICD-10-CM | POA: Insufficient documentation

## 2024-08-21 DIAGNOSIS — R29898 Other symptoms and signs involving the musculoskeletal system: Secondary | ICD-10-CM | POA: Diagnosis present

## 2024-08-21 DIAGNOSIS — M6281 Muscle weakness (generalized): Secondary | ICD-10-CM | POA: Insufficient documentation

## 2024-08-21 DIAGNOSIS — R2681 Unsteadiness on feet: Secondary | ICD-10-CM | POA: Diagnosis present
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
## 2024-08-27 ENCOUNTER — Ambulatory Visit: Admitting: Physical Therapy

## 2024-08-28 ENCOUNTER — Ambulatory Visit: Admitting: Physical Therapy

## 2024-08-29 ENCOUNTER — Ambulatory Visit (INDEPENDENT_AMBULATORY_CARE_PROVIDER_SITE_OTHER): Admitting: Neurology

## 2024-08-29 ENCOUNTER — Encounter: Payer: Self-pay | Admitting: Neurology

## 2024-08-29 VITALS — BP 113/78 | HR 98 | Ht 65.0 in | Wt 160.5 lb

## 2024-08-29 DIAGNOSIS — I610 Nontraumatic intracerebral hemorrhage in hemisphere, subcortical: Secondary | ICD-10-CM

## 2024-08-29 DIAGNOSIS — Z8669 Personal history of other diseases of the nervous system and sense organs: Secondary | ICD-10-CM | POA: Diagnosis not present

## 2024-08-29 DIAGNOSIS — F32A Depression, unspecified: Secondary | ICD-10-CM

## 2024-08-29 DIAGNOSIS — I69354 Hemiplegia and hemiparesis following cerebral infarction affecting left non-dominant side: Secondary | ICD-10-CM

## 2024-08-29 MED ORDER — DIVALPROEX SODIUM ER 500 MG PO TB24: 500.0000 mg | ORAL_TABLET | Freq: Every day | ORAL | 3 refills | Status: AC

## 2024-08-29 NOTE — Progress Notes (Signed)
 "  Chief Complaint  Patient presents with   EMGRM3    Pt is here Alone. Pt states that things have been going okay since his last appointment.       ASSESSMENT AND PLAN  Troy Bishop is a 34 y.o. male   Large right basal ganglia bleeding extending into right temporal lobe in May 2025  In the setting of hypertension, alcohol  abuse, cirrhosis, low platelet  Improving with physical therapy,  Had 1 seizure even before intracranial bleeding, now with right temporal lobe involvement, high risk for recurrent seizure, also complains of depression, difficulty sleeping,  Tolerating Depakote  ER 50 mg every night  EEG in September 2025 showed moderate slowing,  Emphasized importance of alcohol  cessation Refer to physical therapy   No driving until seizure-free for 6 months, last reported seizure-like event was in May 2025, he wants to go back to driving, but complains of vision issues, advised start with DMV evaluation  Laboratory evaluation today   Return in 6 months  DIAGNOSTIC DATA (LABS, IMAGING, TESTING) - I reviewed patient records, labs, notes, testing and imaging myself where available.   MEDICAL HISTORY:  Troy Bishop, is a 34 year old male, accompanied by his wife seen in request by his primary care Billy Craze to follow-up on intracranial hemorrhage, initial office visit April 11, 2024  History is obtained from the patient and review of electronic medical records. I personally reviewed pertinent available imaging films in PACS.   PMHx of  HTN HLD  Long history of alcohol  abuse, hypertension, 1 single seizure before, presenting with sudden onset left-sided weakness Dec 16, 2023, was found to have large right subcortical intracranial hemorrhage, following CT showed enlargement of intracranial hemorrhage, platelet count was 54, received platelet transfusion,   abnormal liver enzyme, alcoholic cirrhosis liver  He was treated with hypertonic saline to decrease edema, use  phenobarbital  along with precedex  drip to treat alcohol  withdrawal symptoms  CT angiogram Dec 16, 2023, evidence of 5.5 x 3.6 x 3.5 cm acute intraparenchymal hemorrhage involving right frontotemporal region, localized edema  CT head and neck showed no large vessel disease,  Echocardiogram ejection fraction 60 to 65% LDL 124, A1c 5.6  MRI of the brain with without contrast Dec 19, 2023, massive intracranial hemorrhage at the right basal ganglia extending into the anterior right temporal lobe,  Most recent repeat CT head without contrast March 19, 2024, prominent encephalomalacia at the site of previous hemorrhage, extending to the tip of right temporal lobe, centered in the right basal ganglia  He is not discharged home, ambulate with left ankle brace, antigravity movement of left upper extremity, he used to work on sewing machine, 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, continue to make mild progress,  He went back to drink at least 2 beers every night to help him sleep, complains of depression, wife reported he gets agitated easily   Most recent repeat lab evaluation was on February 26, 2024, normal CMP, with normal liver function test, only mild elevated glucose 135, CBC showed improved platelet 129, mild decreased hemoglobin of 12.1  UPDATE Aug 29 2024: He brought in by his wife at today's visit, was started on Depakote  ER 500 mg following last visit in September 2025, due to risk of recurrent seizure, he has tolerating it well, no recurrent seizure, ambulate with left AFO,  EEG in September 2025 showed moderate generalized slowing,  Laboratory in November 2025: Normal TSH, platelet 121, slightly improved compared to previous baseline, normal hemoglobin, normal CMP  PHYSICAL EXAM:   Vitals:   08/29/24 0852  Weight: 160 lb 8 oz (72.8 kg)  Height: 5' 5 (1.651 m)   Body mass index is 26.71 kg/m.  PHYSICAL EXAMNIATION:  Gen: NAD, conversant, well nourised, well groomed                      Cardiovascular: Regular rate rhythm, no peripheral edema, warm, nontender. Eyes: Conjunctivae clear without exudates or hemorrhage Neck: Supple, no carotid bruits. Pulmonary: Clear to auscultation bilaterally   NEUROLOGICAL EXAM:  MENTAL STATUS: Speech/cognition: Awake, alert, oriented to history taking and casual conversation, mild slurred speech CRANIAL NERVES: CN II: Visual fields are full to confrontation. Pupils are round equal and briskly reactive to light. CN III, IV, VI: extraocular movement are normal. No ptosis. CN V: Facial sensation is intact to light touch CN VII: Left lower face weakness CN VIII: Hearing is normal to causal conversation. CN IX, X: Phonation is normal. CN XI: Head turning and shoulder shrug are intact  MOTOR: Spastic left upper extremity, left upper extremity mild spasticity, antigravity movement, wear left ankle brace, mild left hip flexion weakness  REFLEXES: Hyperreflexia  of left upper and lower extremity  SENSORY: Intact to light touch, pinprick and vibratory sensation are intact in fingers and toes.  COORDINATION: There is no trunk or limb dysmetria noted.  GAIT/STANCE: Need push-up to get up from seated position, dragging left leg  REVIEW OF SYSTEMS:  Full 14 system review of systems performed and notable only for as above All other review of systems were negative.   ALLERGIES: No Known Allergies  HOME MEDICATIONS: Current Outpatient Medications  Medication Sig Dispense Refill   amLODipine  (NORVASC ) 10 MG tablet Take 1 tablet (10 mg total) by mouth daily. 90 tablet 1   artificial tears ophthalmic solution Place 1 drop into both eyes as needed for dry eyes. 15 mL 0   atorvastatin  (LIPITOR) 80 MG tablet Take 1 tablet (80 mg total) by mouth daily. 90 tablet 3   Baclofen  5 MG TABS TAKE 2 TABLETS(10 MG) BY MOUTH TWICE DAILY 60 tablet 0   carvedilol  (COREG ) 25 MG tablet TAKE 1 TABLET(25 MG) BY MOUTH TWICE DAILY WITH A MEAL 180 tablet 0    divalproex  (DEPAKOTE  ER) 500 MG 24 hr tablet Take 1 tablet (500 mg total) by mouth at bedtime. 30 tablet 11   folic acid  (FOLVITE ) 1 MG tablet Take 1 tablet (1 mg total) by mouth daily. 90 tablet 0   gabapentin  (NEURONTIN ) 300 MG capsule Take 1 capsule (300 mg total) by mouth 2 (two) times daily. 60 capsule 2   hydrOXYzine  (VISTARIL ) 25 MG capsule Take 1 capsule (25 mg total) by mouth every 8 (eight) hours as needed for itching. 30 capsule 0   losartan  (COZAAR ) 100 MG tablet Take 1 tablet (100 mg total) by mouth daily. 90 tablet 1   Multiple Vitamin (MULTIVITAMIN WITH MINERALS) TABS tablet Take 1 tablet by mouth daily. 30 tablet 0   naphazoline-glycerin  (CLEAR EYES REDNESS) 0.012-0.25 % SOLN Place 2 drops into the left eye 3 (three) times daily. 30 mL 0   pantoprazole  (PROTONIX ) 40 MG tablet TAKE 1 TABLET(40 MG) BY MOUTH DAILY 90 tablet 1   Current Facility-Administered Medications  Medication Dose Route Frequency Provider Last Rate Last Admin   betamethasone  acetate-betamethasone  sodium phosphate  (CELESTONE ) injection 12 mg  12 mg Intramuscular Once         PAST MEDICAL HISTORY: Past Medical History:  Diagnosis  Date   Hyperlipidemia    Hypertension    Liver disease    Painful orthopaedic hardware 07/2017   right tibia   Seizures (HCC)    Stroke (cerebrum) (HCC)    Thrombocytopenia    Verruca vulgaris 2023   penile mass    PAST SURGICAL HISTORY: Past Surgical History:  Procedure Laterality Date   BREATH TEK H PYLORI N/A 09/08/2016   Procedure: BREATH TEK VEAR LORA;  Surgeon: Victory LITTIE Legrand DOUGLAS, MD;  Location: [facility];  Service: Gastroenterology;  Laterality: N/A;   ESOPHAGOGASTRODUODENOSCOPY N/A 05/20/2024   Procedure: EGD (ESOPHAGOGASTRODUODENOSCOPY);  Surgeon: Legrand Victory LITTIE DOUGLAS, MD;  Location: [facility];  Service: Gastroenterology;  Laterality: N/A;   HARDWARE REMOVAL Left 07/27/2017   Procedure: Removal of deep implants right proximal and distal tibia;  Surgeon: Kit Rush, MD;  Location: [facility];  Service: Orthopedics;  Laterality: Left;   TENDON REPAIR Left 08/14/2014   Procedure: LEFT HAND WOUND EXPLORATION AND TENDON REPAIR;  Surgeon: Prentice LELON Pagan, MD;  Location: [facility];  Service: Orthopedics;  Laterality: Left;   TIBIA IM NAIL INSERTION  07/31/2012   Procedure: INTRAMEDULLARY (IM) NAIL TIBIAL;  Surgeon: Rush Kit, MD;  Location: [facility];  Service: Orthopedics;  Laterality: Left;   UPPER GI ENDOSCOPY  07/25/2016    FAMILY HISTORY: Family History  Problem Relation Age of Onset   Hypertension Mother    Hypertension Father    Hypertension Maternal Grandfather    Esophageal cancer Neg Hx    Liver disease Neg Hx    Colon cancer Neg Hx     SOCIAL HISTORY: Social History   Socioeconomic History   Marital status: Married    Spouse name: Not on file   Number of children: 4   Years of education: Not on file   Highest education level: Not on file  Occupational History   Not on file  Tobacco Use   Smoking status: Former    Current packs/day: 0.00    Types: Cigarettes    Quit date: 05/13/2014    Years since quitting: 10.3   Smokeless tobacco: Former  Building Services Engineer status: Never Used  Substance and Sexual Activity   Alcohol  use: Yes    Comment: occasionally   Drug use: No   Sexual activity: Not Currently  Other Topics Concern   Not on file  Social History Narrative   ** Merged History Encounter **       Social Drivers of Health   Tobacco Use: Medium Risk (08/29/2024)   Patient History    Smoking Tobacco Use: Former    Smokeless Tobacco Use: Former    Passive Exposure: Not on Actuary Strain: Low Risk (02/26/2024)   Overall Financial Resource Strain (CARDIA)    Difficulty of Paying Living Expenses: Not hard at all  Food Insecurity: No Food Insecurity (04/11/2024)   Epic    Worried About Radiation Protection Practitioner of Food in the Last Year: Never true    Ran Out of Food in the Last Year:  Never true  Transportation Needs: No Transportation Needs (04/11/2024)   Epic    Lack of Transportation (Medical): No    Lack of Transportation (Non-Medical): No  Physical Activity: Inactive (02/26/2024)   Exercise Vital Sign    Days of Exercise per Week: 0 days    Minutes of Exercise per Session: 0 min  Stress: No Stress Concern Present (02/26/2024)   [employer] of Occupational  Health - Occupational Stress Questionnaire    Feeling of Stress: Only a little  Social Connections: Moderately Integrated (02/26/2024)   Social Connection and Isolation Panel    Frequency of Communication with Friends and Family: More than three times a week    Frequency of Social Gatherings with Friends and Family: Once a week    Attends Religious Services: More than 4 times per year    Active Member of Clubs or Organizations: No    Attends Banker Meetings: Never    Marital Status: Married  Catering Manager Violence: Not At Risk (04/11/2024)   Epic    Fear of Current or Ex-Partner: No    Emotionally Abused: No    Physically Abused: No    Sexually Abused: No  Depression (PHQ2-9): Low Risk (06/25/2024)   Depression (PHQ2-9)    PHQ-2 Score: 2  Alcohol  Screen: Low Risk (02/26/2024)   Alcohol  Screen    Last Alcohol  Screening Score (AUDIT): 4  Housing: Unknown (04/11/2024)   Epic    Unable to Pay for Housing in the Last Year: No    Number of Times Moved in the Last Year: Not on file    Homeless in the Last Year: No  Utilities: Not At Risk (04/11/2024)   Epic    Threatened with loss of utilities: No  Health Literacy: Adequate Health Literacy (02/26/2024)   B1300 Health Literacy    Frequency of need for help with medical instructions: Never      Modena Callander, M.D. Ph.D.  [facility] [address] [place], [place] [address] Ph: [phone] Fax: [phone]  CC:  [name], NP [address] [place],  [place] [address]  [name], NP    "

## 2024-08-30 ENCOUNTER — Other Ambulatory Visit: Payer: Self-pay | Admitting: Physical Medicine & Rehabilitation

## 2024-08-30 ENCOUNTER — Ambulatory Visit: Payer: Self-pay | Admitting: Neurology

## 2024-08-30 LAB — COMPREHENSIVE METABOLIC PANEL WITH GFR
ALT: 24 IU/L (ref 0–44)
AST: 22 IU/L (ref 0–40)
Albumin: 4.9 g/dL (ref 4.1–5.1)
Alkaline Phosphatase: 95 IU/L (ref 47–123)
BUN/Creatinine Ratio: 9 (ref 9–20)
BUN: 9 mg/dL (ref 6–20)
Bilirubin Total: 0.7 mg/dL (ref 0.0–1.2)
CO2: 21 mmol/L (ref 20–29)
Calcium: 9.7 mg/dL (ref 8.7–10.2)
Chloride: 105 mmol/L (ref 96–106)
Creatinine, Ser: 1.05 mg/dL (ref 0.76–1.27)
Globulin, Total: 4.1 g/dL (ref 1.5–4.5)
Glucose: 88 mg/dL (ref 70–99)
Potassium: 4.4 mmol/L (ref 3.5–5.2)
Sodium: 143 mmol/L (ref 134–144)
Total Protein: 9 g/dL — ABNORMAL HIGH (ref 6.0–8.5)
eGFR: 95 mL/min/1.73

## 2024-08-30 LAB — CBC WITH DIFFERENTIAL/PLATELET
Basophils Absolute: 0 x10E3/uL (ref 0.0–0.2)
Basos: 1 %
EOS (ABSOLUTE): 0.4 x10E3/uL (ref 0.0–0.4)
Eos: 7 %
Hematocrit: 42.7 % (ref 37.5–51.0)
Hemoglobin: 13.6 g/dL (ref 13.0–17.7)
Immature Grans (Abs): 0 x10E3/uL (ref 0.0–0.1)
Immature Granulocytes: 0 %
Lymphocytes Absolute: 0.9 x10E3/uL (ref 0.7–3.1)
Lymphs: 18 %
MCH: 30 pg (ref 26.6–33.0)
MCHC: 31.9 g/dL (ref 31.5–35.7)
MCV: 94 fL (ref 79–97)
Monocytes Absolute: 0.3 x10E3/uL (ref 0.1–0.9)
Monocytes: 7 %
Neutrophils Absolute: 3.5 x10E3/uL (ref 1.4–7.0)
Neutrophils: 67 %
Platelets: 108 x10E3/uL — ABNORMAL LOW (ref 150–450)
RBC: 4.54 x10E6/uL (ref 4.14–5.80)
RDW: 13.2 % (ref 11.6–15.4)
WBC: 5.2 x10E3/uL (ref 3.4–10.8)

## 2024-08-30 LAB — VALPROIC ACID LEVEL: Valproic Acid Lvl: <4 ug/mL — ABNORMAL LOW (ref 50–100)

## 2024-08-30 NOTE — Telephone Encounter (Signed)
 Requested Prescriptions   Pending Prescriptions Disp Refills   Baclofen  5 MG TABS [Pharmacy Med Name: BACLOFEN  5MG  TABLETS] 60 tablet 0    Sig: TAKE 2 TABLETS(10 MG) BY MOUTH TWICE DAILY     Date of patient request: 08/30/2024 Last office visit: 06/18/2024 Upcoming visit: 09/17/2024 Date of last refill: 07/08/2024 Last refill amount: #60 0 refills

## 2024-09-03 ENCOUNTER — Telehealth: Payer: Self-pay | Admitting: Family Medicine

## 2024-09-03 ENCOUNTER — Ambulatory Visit: Admitting: Occupational Therapy

## 2024-09-03 NOTE — Telephone Encounter (Signed)
 Called pt and informed him we needed him to provide us  this form through insurance.

## 2024-09-03 NOTE — Telephone Encounter (Signed)
 Copied from CRM [id number]. Topic: General - Other >> Sep 03, 2024 11:53 AM Troy Bishop wrote: Reason for CRM: patient needs a dsb form 69448 filled out, states the form is through [facility] for home health. Patient does not have the form. Please advise? [phone]

## 2024-09-09 ENCOUNTER — Other Ambulatory Visit: Payer: Self-pay | Admitting: Family Medicine

## 2024-09-10 ENCOUNTER — Ambulatory Visit: Admitting: Physical Therapy

## 2024-09-11 ENCOUNTER — Encounter: Payer: Self-pay | Admitting: Physical Therapy

## 2024-09-11 ENCOUNTER — Encounter: Payer: Self-pay | Admitting: Occupational Therapy

## 2024-09-11 ENCOUNTER — Telehealth: Payer: Self-pay | Admitting: Family Medicine

## 2024-09-11 ENCOUNTER — Ambulatory Visit: Admitting: Occupational Therapy

## 2024-09-11 ENCOUNTER — Ambulatory Visit: Attending: Physician Assistant | Admitting: Physical Therapy

## 2024-09-11 VITALS — BP 129/74 | HR 95

## 2024-09-11 DIAGNOSIS — M25632 Stiffness of left wrist, not elsewhere classified: Secondary | ICD-10-CM

## 2024-09-11 DIAGNOSIS — R278 Other lack of coordination: Secondary | ICD-10-CM

## 2024-09-11 DIAGNOSIS — R2681 Unsteadiness on feet: Secondary | ICD-10-CM

## 2024-09-11 DIAGNOSIS — R29898 Other symptoms and signs involving the musculoskeletal system: Secondary | ICD-10-CM

## 2024-09-11 DIAGNOSIS — M6281 Muscle weakness (generalized): Secondary | ICD-10-CM

## 2024-09-11 DIAGNOSIS — R2689 Other abnormalities of gait and mobility: Secondary | ICD-10-CM

## 2024-09-11 DIAGNOSIS — S43002D Unspecified subluxation of left shoulder joint, subsequent encounter: Secondary | ICD-10-CM

## 2024-09-11 DIAGNOSIS — R29818 Other symptoms and signs involving the nervous system: Secondary | ICD-10-CM

## 2024-09-11 DIAGNOSIS — M25642 Stiffness of left hand, not elsewhere classified: Secondary | ICD-10-CM

## 2024-09-11 NOTE — Telephone Encounter (Signed)
 Copied from CRM #[id number]. Topic: General - Other >> Sep 11, 2024  1:00 PM Mesmerise C wrote: Reason for CRM: Amid from Home Health wanting provider to fill out [id number] forms and fax over [phone]

## 2024-09-11 NOTE — Patient Instructions (Signed)
 Access Code: [email or web address] URL: [email or web address] Date: 09/11/2024 Prepared by: Clarita Pride  Exercises - Putty Squeezes  - 1-2 x daily - 10 reps - Rolling Putty on Table  - 1-2 x daily - 10 reps - Finger Pinch and Pull with Putty  - 1-2 x daily - 10 reps - Seated Shoulder Flexion AAROM with Dowel  - 1-3 x daily - 5-10 reps - Standing Shoulder Abduction AAROM with Dowel  - 1-3 x daily - 5-10 reps - Shoulder extension with resistance - Neutral  - 1-3 x daily - 5-10 reps - Seated Elbow Flexion with Resistance  - 1-3 x daily - 10 reps - Seated Shoulder Flexion Towel Slide at Table Top  - 1-3 x daily - 10 reps - Wrist Extension AROM  - 1-3 x daily - 10 reps - Wrist Flexion with Dumbbell  - 1-3 x daily - 10 reps - Seated Elbow Flexion and Extension AROM  - 1-3 x daily - 10 reps - Seated UE Activity: Weight-Bearing Side-To-Side Shifts  - 1 x daily - 10 reps - Standing UE Activity: Weight-Bearing With Extended Elbows, Open Hand  - 1 x daily - 10 reps - Quadruped Weight-Bearing Activity: Reaching  - 1 x daily - 10 reps
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
## 2024-09-17 ENCOUNTER — Encounter: Attending: Physical Medicine & Rehabilitation | Admitting: Physical Medicine & Rehabilitation

## 2024-09-18 ENCOUNTER — Ambulatory Visit: Admitting: Physical Therapy

## 2024-09-18 ENCOUNTER — Ambulatory Visit: Admitting: Occupational Therapy

## 2024-09-24 ENCOUNTER — Ambulatory Visit: Admitting: Occupational Therapy

## 2024-09-24 ENCOUNTER — Ambulatory Visit: Admitting: Physical Therapy

## 2024-09-25 ENCOUNTER — Ambulatory Visit: Admitting: Family Medicine

## 2024-10-02 ENCOUNTER — Ambulatory Visit: Admitting: Occupational Therapy

## 2024-10-02 ENCOUNTER — Ambulatory Visit: Admitting: Physical Therapy

## 2024-10-08 ENCOUNTER — Ambulatory Visit: Admitting: Gastroenterology

## 2024-10-09 ENCOUNTER — Ambulatory Visit: Admitting: Occupational Therapy

## 2024-10-09 ENCOUNTER — Ambulatory Visit: Attending: Physician Assistant | Admitting: Physical Therapy

## 2024-10-16 ENCOUNTER — Ambulatory Visit: Admitting: Physical Therapy

## 2024-10-16 ENCOUNTER — Ambulatory Visit: Admitting: Occupational Therapy

## 2024-10-23 ENCOUNTER — Ambulatory Visit: Admitting: Physical Therapy

## 2024-10-23 ENCOUNTER — Ambulatory Visit: Admitting: Occupational Therapy

## 2025-02-27 ENCOUNTER — Ambulatory Visit: Admitting: Neurology

## 8387-04-09 DEATH — deceased
